# Patient Record
Sex: Male | Born: 1945
Health system: Southern US, Community
[De-identification: ages and names within clinical notes are randomized; demographics above are authoritative.]

## PROBLEM LIST (undated history)

## (undated) DIAGNOSIS — I10 Essential (primary) hypertension: Secondary | ICD-10-CM

## (undated) DIAGNOSIS — N183 Chronic kidney disease, stage 3 unspecified: Secondary | ICD-10-CM

## (undated) DIAGNOSIS — E785 Hyperlipidemia, unspecified: Secondary | ICD-10-CM

## (undated) DIAGNOSIS — R55 Syncope and collapse: Secondary | ICD-10-CM

## (undated) DIAGNOSIS — I2510 Atherosclerotic heart disease of native coronary artery without angina pectoris: Secondary | ICD-10-CM

## (undated) DIAGNOSIS — G4733 Obstructive sleep apnea (adult) (pediatric): Secondary | ICD-10-CM

## (undated) DIAGNOSIS — K219 Gastro-esophageal reflux disease without esophagitis: Secondary | ICD-10-CM

## (undated) DIAGNOSIS — I219 Acute myocardial infarction, unspecified: Secondary | ICD-10-CM

## (undated) DIAGNOSIS — T7840XA Allergy, unspecified, initial encounter: Secondary | ICD-10-CM

## (undated) DIAGNOSIS — H269 Unspecified cataract: Secondary | ICD-10-CM

## (undated) DIAGNOSIS — M1990 Unspecified osteoarthritis, unspecified site: Secondary | ICD-10-CM

## (undated) DIAGNOSIS — G4730 Sleep apnea, unspecified: Secondary | ICD-10-CM

## (undated) DIAGNOSIS — G709 Myoneural disorder, unspecified: Secondary | ICD-10-CM

## (undated) HISTORY — DX: Allergy, unspecified, initial encounter: T78.40XA

## (undated) HISTORY — PX: POLYPECTOMY: SHX149

## (undated) HISTORY — DX: Obstructive sleep apnea (adult) (pediatric): G47.33

## (undated) HISTORY — DX: Essential (primary) hypertension: I10

## (undated) HISTORY — DX: Unspecified cataract: H26.9

## (undated) HISTORY — DX: Gastro-esophageal reflux disease without esophagitis: K21.9

## (undated) HISTORY — PX: COLONOSCOPY: SHX174

## (undated) HISTORY — DX: Sleep apnea, unspecified: G47.30

## (undated) HISTORY — DX: Hyperlipidemia, unspecified: E78.5

## (undated) HISTORY — PX: TRANSTHORACIC ECHOCARDIOGRAM: SHX275

## (undated) HISTORY — DX: Syncope and collapse: R55

## (undated) HISTORY — DX: Atherosclerotic heart disease of native coronary artery without angina pectoris: I25.10

## (undated) HISTORY — PX: CARDIAC CATHETERIZATION: SHX172

## (undated) HISTORY — DX: Unspecified osteoarthritis, unspecified site: M19.90

## (undated) HISTORY — DX: Myoneural disorder, unspecified: G70.9

---
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
## 1981-01-25 HISTORY — PX: VASECTOMY: SHX75

## 1981-01-25 HISTORY — PX: INGUINAL HERNIA REPAIR: SUR1180

## 1996-01-26 HISTORY — PX: SPERMATOCELECTOMY: SHX2420

## 1997-10-08 ENCOUNTER — Other Ambulatory Visit: Admission: RE | Admit: 1997-10-08 | Discharge: 1997-10-08 | Payer: Self-pay | Admitting: Internal Medicine

## 2003-12-04 ENCOUNTER — Ambulatory Visit: Payer: Self-pay | Admitting: Internal Medicine

## 2004-03-20 ENCOUNTER — Ambulatory Visit: Payer: Self-pay | Admitting: Internal Medicine

## 2004-06-29 ENCOUNTER — Ambulatory Visit: Payer: Self-pay | Admitting: Internal Medicine

## 2004-09-21 ENCOUNTER — Ambulatory Visit: Payer: Self-pay | Admitting: Internal Medicine

## 2004-10-15 ENCOUNTER — Ambulatory Visit: Payer: Self-pay | Admitting: Pulmonary Disease

## 2004-10-15 ENCOUNTER — Ambulatory Visit (HOSPITAL_BASED_OUTPATIENT_CLINIC_OR_DEPARTMENT_OTHER): Admission: RE | Admit: 2004-10-15 | Discharge: 2004-10-15 | Payer: Self-pay | Admitting: Pulmonary Disease

## 2004-10-28 ENCOUNTER — Ambulatory Visit: Payer: Self-pay | Admitting: Pulmonary Disease

## 2004-11-09 ENCOUNTER — Ambulatory Visit: Payer: Self-pay | Admitting: Pulmonary Disease

## 2004-11-19 ENCOUNTER — Ambulatory Visit: Payer: Self-pay | Admitting: Internal Medicine

## 2005-01-06 ENCOUNTER — Ambulatory Visit: Payer: Self-pay | Admitting: Internal Medicine

## 2005-03-30 ENCOUNTER — Ambulatory Visit: Payer: Self-pay | Admitting: Internal Medicine

## 2005-05-10 ENCOUNTER — Ambulatory Visit: Payer: Self-pay | Admitting: Pulmonary Disease

## 2005-06-04 ENCOUNTER — Encounter: Payer: Self-pay | Admitting: Pulmonary Disease

## 2005-06-18 ENCOUNTER — Ambulatory Visit: Payer: Self-pay | Admitting: Pulmonary Disease

## 2005-09-13 ENCOUNTER — Ambulatory Visit: Payer: Self-pay | Admitting: Internal Medicine

## 2005-10-01 ENCOUNTER — Ambulatory Visit: Payer: Self-pay | Admitting: Internal Medicine

## 2005-11-29 ENCOUNTER — Ambulatory Visit: Payer: Self-pay | Admitting: Internal Medicine

## 2005-12-06 ENCOUNTER — Ambulatory Visit: Payer: Self-pay | Admitting: Internal Medicine

## 2005-12-20 ENCOUNTER — Ambulatory Visit: Payer: Self-pay | Admitting: Internal Medicine

## 2006-01-13 ENCOUNTER — Ambulatory Visit: Payer: Self-pay | Admitting: Family Medicine

## 2006-03-11 ENCOUNTER — Ambulatory Visit: Payer: Self-pay | Admitting: Internal Medicine

## 2006-05-14 DIAGNOSIS — K219 Gastro-esophageal reflux disease without esophagitis: Secondary | ICD-10-CM

## 2006-05-14 DIAGNOSIS — I10 Essential (primary) hypertension: Secondary | ICD-10-CM | POA: Insufficient documentation

## 2006-05-14 DIAGNOSIS — J309 Allergic rhinitis, unspecified: Secondary | ICD-10-CM | POA: Insufficient documentation

## 2006-05-14 DIAGNOSIS — G4733 Obstructive sleep apnea (adult) (pediatric): Secondary | ICD-10-CM | POA: Insufficient documentation

## 2006-05-14 DIAGNOSIS — E785 Hyperlipidemia, unspecified: Secondary | ICD-10-CM | POA: Insufficient documentation

## 2007-02-16 ENCOUNTER — Ambulatory Visit: Payer: Self-pay | Admitting: Internal Medicine

## 2007-02-16 DIAGNOSIS — R109 Unspecified abdominal pain: Secondary | ICD-10-CM | POA: Insufficient documentation

## 2007-02-16 DIAGNOSIS — R079 Chest pain, unspecified: Secondary | ICD-10-CM | POA: Insufficient documentation

## 2007-02-17 LAB — CONVERTED CEMR LAB
Amylase: 64 units/L (ref 27–131)
BUN: 25 mg/dL — ABNORMAL HIGH (ref 6–23)
Basophils Relative: 0.1 % (ref 0.0–1.0)
Bilirubin, Direct: 0.1 mg/dL (ref 0.0–0.3)
Calcium: 9.9 mg/dL (ref 8.4–10.5)
Cholesterol: 218 mg/dL (ref 0–200)
Eosinophils Relative: 2 % (ref 0.0–5.0)
GFR calc Af Amer: 73 mL/min
Hemoglobin: 16.5 g/dL (ref 13.0–17.0)
Lipase: 21 units/L (ref 11.0–59.0)
Lymphocytes Relative: 21.5 % (ref 12.0–46.0)
Monocytes Absolute: 0.5 10*3/uL (ref 0.2–0.7)
Monocytes Relative: 12.4 % — ABNORMAL HIGH (ref 3.0–11.0)
Neutro Abs: 2.9 10*3/uL (ref 1.4–7.7)
Potassium: 4.3 meq/L (ref 3.5–5.1)
Total Bilirubin: 1 mg/dL (ref 0.3–1.2)
Total Protein: 8.1 g/dL (ref 6.0–8.3)
VLDL: 20 mg/dL (ref 0–40)
WBC: 4.4 10*3/uL — ABNORMAL LOW (ref 4.5–10.5)

## 2007-05-01 ENCOUNTER — Ambulatory Visit: Payer: Self-pay | Admitting: Internal Medicine

## 2007-05-09 ENCOUNTER — Encounter: Payer: Self-pay | Admitting: Family Medicine

## 2007-05-09 ENCOUNTER — Encounter: Payer: Self-pay | Admitting: Internal Medicine

## 2007-05-09 ENCOUNTER — Ambulatory Visit (HOSPITAL_COMMUNITY): Admission: RE | Admit: 2007-05-09 | Discharge: 2007-05-09 | Payer: Self-pay | Admitting: Internal Medicine

## 2007-05-09 LAB — HM COLONOSCOPY: HM Colonoscopy: NORMAL

## 2007-05-17 ENCOUNTER — Ambulatory Visit: Payer: Self-pay | Admitting: Internal Medicine

## 2007-07-19 ENCOUNTER — Ambulatory Visit: Payer: Self-pay | Admitting: Internal Medicine

## 2007-07-19 DIAGNOSIS — A938 Other specified arthropod-borne viral fevers: Secondary | ICD-10-CM

## 2008-07-09 ENCOUNTER — Encounter: Payer: Self-pay | Admitting: Internal Medicine

## 2008-07-10 ENCOUNTER — Ambulatory Visit: Payer: Self-pay | Admitting: Internal Medicine

## 2008-07-12 LAB — CONVERTED CEMR LAB
AST: 20 units/L (ref 0–37)
Alkaline Phosphatase: 76 units/L (ref 39–117)
BUN: 29 mg/dL — ABNORMAL HIGH (ref 6–23)
Basophils Relative: 0.1 % (ref 0.0–3.0)
Bilirubin, Direct: 0 mg/dL (ref 0.0–0.3)
CO2: 31 meq/L (ref 19–32)
Calcium: 9.2 mg/dL (ref 8.4–10.5)
Cholesterol: 216 mg/dL — ABNORMAL HIGH (ref 0–200)
Creatinine, Ser: 1.2 mg/dL (ref 0.4–1.5)
Direct LDL: 143.7 mg/dL
Eosinophils Absolute: 0.1 10*3/uL (ref 0.0–0.7)
HCT: 43.9 % (ref 39.0–52.0)
Lymphs Abs: 0.9 10*3/uL (ref 0.7–4.0)
MCHC: 34.5 g/dL (ref 30.0–36.0)
MCV: 92.4 fL (ref 78.0–100.0)
Microalb, Ur: 1.8 mg/dL (ref 0.0–1.9)
Monocytes Absolute: 0.5 10*3/uL (ref 0.1–1.0)
Neutrophils Relative %: 61.7 % (ref 43.0–77.0)
PSA: 0.39 ng/mL (ref 0.10–4.00)
RBC: 4.75 M/uL (ref 4.22–5.81)
Sodium: 141 meq/L (ref 135–145)
Total Bilirubin: 1 mg/dL (ref 0.3–1.2)
Total CHOL/HDL Ratio: 5

## 2008-07-25 ENCOUNTER — Ambulatory Visit: Payer: Self-pay | Admitting: Pulmonary Disease

## 2008-11-13 ENCOUNTER — Ambulatory Visit: Payer: Self-pay | Admitting: Internal Medicine

## 2008-12-23 ENCOUNTER — Telehealth: Payer: Self-pay | Admitting: Internal Medicine

## 2009-02-12 ENCOUNTER — Ambulatory Visit: Payer: Self-pay | Admitting: Internal Medicine

## 2009-07-31 ENCOUNTER — Ambulatory Visit: Payer: Self-pay | Admitting: Internal Medicine

## 2009-08-01 LAB — CONVERTED CEMR LAB
AST: 21 units/L (ref 0–37)
Albumin: 4.1 g/dL (ref 3.5–5.2)
Alkaline Phosphatase: 73 units/L (ref 39–117)
BUN: 26 mg/dL — ABNORMAL HIGH (ref 6–23)
Basophils Absolute: 0.1 10*3/uL (ref 0.0–0.1)
CO2: 27 meq/L (ref 19–32)
Calcium: 9.1 mg/dL (ref 8.4–10.5)
Cholesterol: 198 mg/dL (ref 0–200)
Creatinine, Ser: 1.3 mg/dL (ref 0.4–1.5)
Eosinophils Absolute: 0.2 10*3/uL (ref 0.0–0.7)
GFR calc non Af Amer: 61.11 mL/min (ref >60)
Glucose, Bld: 91 mg/dL (ref 70–99)
HDL: 45.4 mg/dL (ref >39.00)
Lymphocytes Relative: 26.4 % (ref 12.0–46.0)
MCHC: 34.8 g/dL (ref 30.0–36.0)
Monocytes Relative: 15.8 % — ABNORMAL HIGH (ref 3.0–12.0)
Neutro Abs: 2.1 10*3/uL (ref 1.4–7.7)
Platelets: 215 10*3/uL (ref 150.0–400.0)
RDW: 12.6 % (ref 11.5–14.6)
TSH: 1.79 microintl units/mL (ref 0.35–5.50)
Total Bilirubin: 0.7 mg/dL (ref 0.3–1.2)
Triglycerides: 112 mg/dL (ref 0.0–149.0)
VLDL: 22.4 mg/dL (ref 0.0–40.0)

## 2009-08-04 ENCOUNTER — Ambulatory Visit: Payer: Self-pay | Admitting: Internal Medicine

## 2009-08-04 DIAGNOSIS — B079 Viral wart, unspecified: Secondary | ICD-10-CM | POA: Insufficient documentation

## 2009-08-06 ENCOUNTER — Ambulatory Visit: Payer: Self-pay | Admitting: Family Medicine

## 2009-08-06 DIAGNOSIS — M722 Plantar fascial fibromatosis: Secondary | ICD-10-CM | POA: Insufficient documentation

## 2009-08-06 DIAGNOSIS — M6580 Other synovitis and tenosynovitis, unspecified site: Secondary | ICD-10-CM

## 2009-09-09 ENCOUNTER — Ambulatory Visit: Payer: Self-pay | Admitting: Internal Medicine

## 2009-09-10 LAB — CONVERTED CEMR LAB
Albumin: 4.1 g/dL (ref 3.5–5.2)
Chloride: 99 meq/L (ref 96–112)
Glucose, Bld: 77 mg/dL (ref 70–99)
Phosphorus: 3.1 mg/dL (ref 2.3–4.6)
Potassium: 4.2 meq/L (ref 3.5–5.1)
Sodium: 138 meq/L (ref 135–145)

## 2009-09-25 DIAGNOSIS — I2510 Atherosclerotic heart disease of native coronary artery without angina pectoris: Secondary | ICD-10-CM

## 2009-09-25 HISTORY — DX: Atherosclerotic heart disease of native coronary artery without angina pectoris: I25.10

## 2009-10-18 ENCOUNTER — Ambulatory Visit: Payer: Self-pay | Admitting: Cardiology

## 2009-10-18 ENCOUNTER — Inpatient Hospital Stay (HOSPITAL_COMMUNITY): Admission: EM | Admit: 2009-10-18 | Discharge: 2009-10-20 | Payer: Self-pay | Admitting: Emergency Medicine

## 2009-10-18 DIAGNOSIS — I2510 Atherosclerotic heart disease of native coronary artery without angina pectoris: Secondary | ICD-10-CM | POA: Insufficient documentation

## 2009-10-20 HISTORY — PX: LEFT HEART CATH AND CORONARY ANGIOGRAPHY: CATH118249

## 2009-11-14 ENCOUNTER — Telehealth (INDEPENDENT_AMBULATORY_CARE_PROVIDER_SITE_OTHER): Payer: Self-pay | Admitting: *Deleted

## 2009-12-04 ENCOUNTER — Ambulatory Visit: Payer: Self-pay | Admitting: Pulmonary Disease

## 2009-12-10 ENCOUNTER — Telehealth (INDEPENDENT_AMBULATORY_CARE_PROVIDER_SITE_OTHER): Payer: Self-pay | Admitting: *Deleted

## 2009-12-11 ENCOUNTER — Telehealth: Payer: Self-pay | Admitting: Pulmonary Disease

## 2010-02-24 NOTE — Assessment & Plan Note (Signed)
Summary: 30 MIN APPT - REFER FRM DR. LETVAK TO EVAL ONGOING RGT HEEL P...   Vital Signs:  Patient profile:   65 year old male Height:      69 inches Weight:      191.2 pounds BMI:     28.34 Temp:     98.8 degrees F oral Pulse rate:   76 / minute Pulse rhythm:   regular BP sitting:   130 / 80  (left arm) Cuff size:   regular  Vitals Entered By: Benny Lennert CMA Duncan Dull) (August 06, 2009 2:00 PM)  History of Present Illness: Chief complaint ongoing right heel pain per letvak  65 year old male:  very pleasant active gentleman who presents with an approximate two-month history of right sided heel pain.  He has no traumatic incidents of injury.  He describes this as if it feels like a stone bruise. No twisting injury, no fall or other history of chronic foot and ankle problems.  Patient did sustain a severe left-sided ankle sprain several years ago.  Symptoms or worsens after a very long hard day  and with walking a lot particularly up and down hills.  right LE: The patient has had some right-sided lateral tendinopathy on his elbow  as well,  however at this point he is asymptomatic and not really having any problems.  He also mentions thate has had some recent back pain, however again he is fully asymptomatic and having no difficulty.    Allergies: 1)  ! Bisoprolol-Hydrochlorothiazide (Bisoprolol-Hydrochlorothiazide)  Past History:  Past medical, surgical, family and social histories (including risk factors) reviewed, and no changes noted (except as noted below).  Past Medical History: Reviewed history from 07/10/2008 and no changes required. Allergic rhinitis GERD Hyperlipidemia Hypertension Obstructive sleep apnea  Past Surgical History: Reviewed history from 07/10/2008 and no changes required. 1982     RIH  9/99      EGD--esophagitis 10/98    Spermatocele repair Isabel Caprice) 05/09/07 Colonoscopy Nml (Dr Juanda Chance)   Family History: Reviewed history from 11/13/2008 and  no changes required. Dad died @70   DM, CHF, asthma Mom died @55  of CVA, HTN 4 brothers--1 has neuropathy 1 sister--has diet controlled diabetes HTN in all sibs No colon cancer Brother with prostate cancer  Social History: Reviewed history from 02/16/2007 and no changes required. 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 Otherwise the pertinent positives of the ROS are noted above.    Physical Exam  General:  GEN: Well-developed,well-nourished,in no acute distress; alert,appropriate and cooperative throughout examination HEENT: Normocephalic and atraumatic without obvious abnormalities. No apparent alopecia or balding. Ears, externally no deformities PULM: Breathing comfortably in no respiratory distress EXT: No clubbing, cyanosis, or edema PSYCH: Normally interactive. Cooperative during the interview. Pleasant. Friendly and conversant. Not anxious or depressed appearing. Normal, full affect.  Msk:  R foot Echymosis: no Edema: no ROM: full LE B Gait: heel toe, non-antalgic MT pain: no Lateral Mall: NT Medial Mall: NT Talus: NT Navicular: NT Calcaneous: NT Metatarsals: NT 5th MT: NT Phalanges: NT Achilles: NT Plantar Fascia: tender, medial along PF. Pain with forced dorsi Fat Pad: NT Peroneals: NT Post Tib: NT Great Toe: limited motion and some bony  hypertrophy Ant Drawer: neg Other foot breakdown: none Long arch: preserved, cavus foot Transverse arch: some breakdown and bunion formation. Hindfoot breakdown: none Sensation: intact  Additional Exam:  R elbow Ecchymosis or edema: neg ROM: full flexion, extension, pronation, supination Shoulder ROM:  Full Flexion: 5/5 Extension: 5/5 Supination: 5/5  Pronation: 5/5 Wrist ext: 5/5 Wrist flexion: 5/5 No gross bony abnormality Varus and Valgus stress: stable ECRB tenderness: neg Medial epicondyle: NT Lateral epicondyle, resisted wrist extension from wrist full pronation and flexion: NT grip: 5/5  sensation intact    Impression & Recommendations:  Problem # 1:  PLANTAR FASCIITIS (ICD-728.71) Assessment New >30 minutes spent in face to face time with patient, >50% spent in counselling or coordination of care: extensively reviewed the anatomy and mechanism with the patient. Given rehabilitation programs from the American Academy of foot and ankle surgeons and the American Academy of orthopedic sports medicine for plantar fasciitis. We reviewed all of these til he understood  Also placed in Tuli's heel cups  Patient has some decent OTC orthotics, which I would continue these  cc: Dr. Alphonsus Sias  His updated medication list for this problem includes:    Ibuprofen 200 Mg Caps (Ibuprofen) .Marland Kitchen... Take 2 by mouth two times a day as needed  Problem # 2:  TENDINITIS, ELBOW (ICD-727.09) Assessment: New aymptomatic at this point, rec basic elbow care and rehab  Complete Medication List: 1)  Prilosec 20 Mg Cpdr (Omeprazole) .... Take 1 tablet by mouth once a day 2)  Fish Oil 1000 Mg Caps (Omega-3 fatty acids) .... Take 1 by mouth two times a day 3)  Glucosamine-chondroitin 1500-1200 Mg/66ml Liqd (Glucosamine-chondroitin) .... Take 1 by mouth two times a day 4)  Ibuprofen 200 Mg Caps (Ibuprofen) .... Take 2 by mouth two times a day as needed 5)  Acidophilus Caps (Lactobacillus) .... Take 1 by mouth once daily 6)  Losartan Potassium-hctz 50-12.5 Mg Tabs (Losartan potassium-hctz) .Marland Kitchen.. 1 tab daily for high blood pressure  Current Allergies (reviewed today): ! BISOPROLOL-HYDROCHLOROTHIAZIDE (BISOPROLOL-HYDROCHLOROTHIAZIDE)

## 2010-02-24 NOTE — Progress Notes (Signed)
Summary: oral appliance  Phone Note Call from Patient Call back at Home Phone [phone]   Caller: Spouse-linda Creer Call For: clance Summary of Call: needs dr Myrtis Ser first name and the oh# to call. (regarding referral)  Initial call taken by: Tivis Ringer, CNA,  December 10, 2009 4:42 PM  Follow-up for Phone Call        spoke with [facility] of patient-she is aware that is was Althea Grimmer [address]; the phone number was given to [place] as well.Reynaldo Minium CMA  December 10, 2009 5:05 PM

## 2010-02-24 NOTE — Progress Notes (Signed)
Summary:  dental appliance  Phone Note Call from Patient Call back at Home Phone [phone]   Caller: Spouse--linda--[phone] Call For: clance Reason for Call: Talk to Nurse Summary of Call: Patient's wife calling for patient.  Patient last saw [facility] 7/10 and at that visit Dr. Shelle Iron suggested that he might would benefit from a dental appliance.  They are now wanting a referral. Initial call taken by: Lehman Prom,  November 14, 2009 10:29 AM  Follow-up for Phone Call        told pt's wife he would need ov because it has been over a yr since he saw kc --appt scheduled for 12/04/09 at 2:15 Follow-up by: Philipp Deputy CMA,  November 14, 2009 10:52 AM

## 2010-02-24 NOTE — Assessment & Plan Note (Signed)
Summary: f/u for osa   Primary Provider/Referring Provider:  Alphonsus Sias  CC:  pt here to discuss dental appliance. Marland Kitchen  History of Present Illness: the pt comes in today for f/u of his osa.  He has known moderate osa from 2006, with AHI of 22/hr.  He has been tried on cpap, and was unable to tolerate the masks.  I saw him over a year ago and recommended a possible dental appliance, but he pt never followed thru.  He comes in today where he has continued to have snoring, and has definite daytime sleepiness with periods of inactivity.  Complicating this, he has recently had an MI, and is worried about the impact of untreated osa on his CV health.  Current Medications (verified): 1)  Losartan Potassium-Hctz 100-25 Mg Tabs (Losartan Potassium-Hctz) .... Once Daily 2)  Prilosec 20 Mg  Cpdr (Omeprazole) .... Take 1 Tablet By Mouth Once A Day 3)  Fish Oil 1000 Mg Caps (Omega-3 Fatty Acids) .... Take 1 By Mouth Two Times A Day 4)  Ibuprofen 200 Mg Caps (Ibuprofen) .... Take 2 By Mouth Two Times A Day As Needed 5)  Aspirin 325 Mg Tabs (Aspirin) .... Once Daily 6)  Lipitor 40 Mg Tabs (Atorvastatin Calcium) .... Once Daily 7)  Metoprolol Succinate 50 Mg Xr24h-Tab (Metoprolol Succinate) .... Once Daily 8)  Multivitamins  Caps (Multiple Vitamin) .... Once Daily  Allergies (verified): 1)  ! Bisoprolol-Hydrochlorothiazide (Bisoprolol-Hydrochlorothiazide) PMH-FH-SH reviewed-no changes except otherwise noted  Review of Systems  The patient denies shortness of breath with activity, shortness of breath at rest, productive cough, non-productive cough, coughing up blood, chest pain, irregular heartbeats, acid heartburn, indigestion, loss of appetite, weight change, abdominal pain, difficulty swallowing, sore throat, tooth/dental problems, headaches, nasal congestion/difficulty breathing through nose, sneezing, itching, ear ache, anxiety, depression, hand/feet swelling, joint stiffness or pain, rash, change in color  of mucus, and fever.    Vital Signs:  Patient profile:   65 year old male Height:      69 inches Weight:      191 pounds BMI:     28.31 O2 Sat:      95 % on Room air Temp:     97.9 degrees F oral Pulse rate:   80 / minute BP sitting:   124 / 78  (left arm) Cuff size:   regular  Vitals Entered By: Carver Fila (December 04, 2009 2:26 PM)  O2 Flow:  Room air CC: pt here to discuss dental appliance.  Comments meds and allergies updated Phone number updated Carver Fila  December 04, 2009 2:27 PM    Physical Exam  General:  ow male in nad Extremities:  no edema noted, no cyanosis  Neurologic:  alert, does not appear sleepy, moves all 4.   Impression & Recommendations:  Problem # 1:  OBSTRUCTIVE SLEEP APNEA (ICD-327.23) the pt has known osa and is cpap intolerant.  I have discussed other treatment options with him, and feel that dental appliance Myren be a viable option for him.  Will make the referral for him, and he is to contact me if any further concerns or questions.  I will be available for questions or f/u sleep testing down the road when required.  I have also asked the pt to work on modest weight loss.  Medications Added to Medication List This Visit: 1)  Losartan Potassium-hctz 100-25 Mg Tabs (Losartan potassium-hctz) .... Once daily 2)  Aspirin 325 Mg Tabs (Aspirin) .... Once daily 3)  Lipitor 40  Mg Tabs (Atorvastatin calcium) .... Once daily 4)  Metoprolol Succinate 50 Mg Xr24h-tab (Metoprolol succinate) .... Once daily 5)  Multivitamins Caps (Multiple vitamin) .... Once daily  Other Orders: Est. Patient Level III (88416) Dental Referral (Dentist)  Patient Instructions: 1)  will check and see which dental provider is on your insurance plan and make the referral. 2)  work on modest weight loss.

## 2010-02-24 NOTE — Assessment & Plan Note (Signed)
Summary: 2 month follow up/rbh   Vital Signs:  Patient profile:   65 year old male Weight:      191 pounds Temp:     98.5 degrees F oral Pulse rate:   80 / minute Pulse rhythm:   regular BP sitting:   148 / 90  (left arm) Cuff size:   large  Vitals Entered By: Mervin Hack CMA Duncan Dull) (February 12, 2009 8:17 AM)  Serial Vital Signs/Assessments:  Time      Position  BP       Pulse  Resp  Temp     By           R Arm     144/90                         Cindee Salt MD  CC: 2 month follow-up   History of Present Illness: No problems with the amlodipine doesn't check his blood pressure  Stopped the ziac after severe joint stiffness and aching These did fade away once stopping it  No chest pain No SOB No edema No dizziness  Allergies: 1)  ! Bisoprolol-Hydrochlorothiazide (Bisoprolol-Hydrochlorothiazide)  Past History:  Past medical, surgical, family and social histories (including risk factors) reviewed for relevance to current acute and chronic problems.  Past Medical History: Reviewed history from 07/10/2008 and no changes required. Allergic rhinitis GERD Hyperlipidemia Hypertension Obstructive sleep apnea  Past Surgical History: Reviewed history from 07/10/2008 and no changes required. 1982     RIH  9/99      EGD--esophagitis 10/98    Spermatocele repair Isabel Caprice) 05/09/07 Colonoscopy Nml (Dr Juanda Chance)   Family History: Reviewed history from 11/13/2008 and no changes required. Dad died @70   DM, CHF, asthma Mom died @55  of CVA, HTN 4 brothers--1 has neuropathy 1 sister--has diet controlled diabetes HTN in all sibs No colon cancer Brother with prostate cancer  Social History: Reviewed history from 02/16/2007 and no changes required. 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         no new stomach  trouble  Physical Exam  General:  alert and normal appearance.   Neck:  supple, no masses, no thyromegaly, no carotid bruits, and no cervical lymphadenopathy.   Lungs:  normal respiratory effort and normal breath sounds.   Heart:  normal rate, regular rhythm, no murmur, and no gallop.   Msk:  no joint tenderness and no joint swelling.   Extremities:  No edema Psych:  normally interactive, good eye contact, not anxious appearing, and not depressed appearing.     Impression & Recommendations:  Problem # 1:  HYPERTENSION (ICD-401.9) Assessment Improved reasonable control now will continue current meds  His updated medication list for this problem includes:    Amlodipine Besylate 5 Mg Tabs (Amlodipine besylate) .Marland Kitchen... Take 1 by mouth once daily  BP today: 148/90 Prior BP: 140/90 (11/13/2008)  Labs Reviewed: K+: 3.9 (07/10/2008) Creat: : 1.2 (07/10/2008)   Chol: 216 (07/10/2008)   HDL: 44.30 (07/10/2008)   LDL: DEL (02/16/2007)   TG: 101.0 (07/10/2008)  Complete Medication List: 1)  Prilosec 20 Mg Cpdr (Omeprazole) .... Take 1 tablet by mouth once a day 2)  Aspirin 81 Mg Tabs (Aspirin) .... Take 1 by mouth once daily 3)  Acidophilus Caps (Lactobacillus) .... Take 1 by mouth once daily 4)  Amlodipine Besylate  5 Mg Tabs (Amlodipine besylate) .... Take 1 by mouth once daily  Patient Instructions: 1)  Please schedule a follow-up appointment in 6 months for physical  Current Allergies (reviewed today): ! BISOPROLOL-HYDROCHLOROTHIAZIDE (BISOPROLOL-HYDROCHLOROTHIAZIDE)

## 2010-02-24 NOTE — Assessment & Plan Note (Signed)
Summary: ONE MONTH FOLLOW UP / LFW   Vital Signs:  Patient profile:   65 year old male Weight:      194 pounds Temp:     98.1 degrees F oral Pulse rate:   72 / minute Pulse rhythm:   regular BP sitting:   150 / 90  (left arm) Cuff size:   large  Vitals Entered By: Mervin Hack CMA Duncan Dull) (September 09, 2009 11:17 AM) CC: 1 month follow-up   History of Present Illness: No problems with the new BP med No dizziness or chest pain No SOB Occ headaches  BP was better the next day when he saw Dr Patsy Lager Home BP 138/80 or so  Allergies: 1)  ! Bisoprolol-Hydrochlorothiazide (Bisoprolol-Hydrochlorothiazide)  Past History:  Past medical, surgical, family and social histories (including risk factors) reviewed for relevance to current acute and chronic problems.  Past Medical History: Reviewed history from 07/10/2008 and no changes required. Allergic rhinitis GERD Hyperlipidemia Hypertension Obstructive sleep apnea  Past Surgical History: Reviewed history from 07/10/2008 and no changes required. 1982     RIH  9/99      EGD--esophagitis 10/98    Spermatocele repair Isabel Caprice) 05/09/07 Colonoscopy Nml (Dr Juanda Chance)   Family History: Reviewed history from 11/13/2008 and no changes required. Dad died @70   DM, CHF, asthma Mom died @55  of CVA, HTN 4 brothers--1 has neuropathy 1 sister--has diet controlled diabetes HTN in all sibs No colon cancer Brother with prostate cancer  Social History: Reviewed history from 02/16/2007 and no changes required. 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   Neck:  supple, no masses, no thyromegaly, no carotid bruits, and no cervical lymphadenopathy.   Lungs:  normal respiratory effort, no intercostal retractions, no accessory muscle  use, and normal breath sounds.   Heart:  normal rate, regular rhythm, no murmur, and no gallop.   Extremities:  no edema Psych:  normally interactive, good eye contact, not anxious appearing, and not depressed appearing.     Impression & Recommendations:  Problem # 1:  HYPERTENSION (ICD-401.9) Assessment Improved  better than last visit some variabliity --low at visit with Dr Patsy Lager will continue med  His updated medication list for this problem includes:    Losartan Potassium-hctz 50-12.5 Mg Tabs (Losartan potassium-hctz) .Marland Kitchen... 1 tab daily for high blood pressure  BP today: 150/90 Prior BP: 130/80 (08/06/2009)  Labs Reviewed: K+: 4.2 (07/31/2009) Creat: : 1.3 (07/31/2009)   Chol: 198 (07/31/2009)   HDL: 45.40 (07/31/2009)   LDL: 130 (07/31/2009)   TG: 112.0 (07/31/2009)  Orders: TLB-Renal Function Panel (80069-RENAL) Venipuncture (16109)  Complete Medication List: 1)  Losartan Potassium-hctz 50-12.5 Mg Tabs (Losartan potassium-hctz) .Marland Kitchen.. 1 tab daily for high blood pressure 2)  Prilosec 20 Mg Cpdr (Omeprazole) .... Take 1 tablet by mouth once a day 3)  Fish Oil 1000 Mg Caps (Omega-3 fatty acids) .... Take 1 by mouth two times a day 4)  Glucosamine-chondroitin 1500-1200 Mg/59ml Liqd (Glucosamine-chondroitin) .... Take 1 by mouth two times a day 5)  Ibuprofen 200 Mg Caps (Ibuprofen) .... Take 2 by mouth two times a day as needed 6)  Acidophilus Caps (Lactobacillus) .... Take 1 by mouth once daily  Patient Instructions: 1)  Please schedule a follow-up appointment in 6 months .   Current Allergies (  reviewed today): ! BISOPROLOL-HYDROCHLOROTHIAZIDE (BISOPROLOL-HYDROCHLOROTHIAZIDE)

## 2010-02-24 NOTE — Progress Notes (Signed)
Summary: referral appt  Phone Note Call from Patient Call back at Home Phone [phone]   Caller: Patient Call For: Jermery Caratachea Summary of Call: Pt states that it has been a week now and he still hasn't heard from Dr. Henrietta Hoover office, pls advise. Initial call taken by: Darletta Moll,  December 11, 2009 11:26 AM  Follow-up for Phone Call        I called Dr. Henrietta Hoover office at [phone] and spoke with Gigi Gin and she states she did not receive any information on this pt.  I have refaxed referral and records toDr. Myrtis Ser office. Peggy aware. pt also aware. Carron Curie CMA  December 11, 2009 11:49 AM

## 2010-02-24 NOTE — Assessment & Plan Note (Signed)
Summary: CPX/DLO   Vital Signs:  Patient profile:   65 year old male Height:      69 inches Weight:      189 pounds BMI:     28.01 Temp:     97.5 degrees F tympanic Pulse rate:   76 / minute Pulse rhythm:   regular BP sitting:   168 / 110  (left arm) Cuff size:   large  Vitals Entered By: Mervin Hack CMA Duncan Dull) (August 04, 2009 11:58 AM)  Serial Vital Signs/Assessments:  Comments: 11:59 AM checked pt's right arm 162/108 sitting By: Mervin Hack CMA (AAMA)    History of Present Illness: Doing fine  No new concerns Still concerned about his joint and muscle stiffness wondered about the amlodipine---we stopped the bisoprolol for the same reason has been taking ibuprofen for this with some relief  checks BP occ Very variable--   145/92 to 165/105 did stop the amlodipine for 2 weeks --didn't affect the myalgias really  having sig right heel pain esp bad when on his feet for a while---push mowing or blowing at work did get OTC arch supports  Right elbow pain occ very severe "like tennis elbow" Milone be related to using it to push 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 Bisoprolol-Hydrochlorothiazide (Bisoprolol-Hydrochlorothiazide)  Past History:  Past medical, surgical, family and social histories (including risk factors) reviewed for relevance to current acute and chronic problems.  Past Medical History: Reviewed history from 07/10/2008 and no changes required. Allergic rhinitis GERD Hyperlipidemia Hypertension Obstructive sleep apnea  Past Surgical History: Reviewed history from 07/10/2008 and no changes required. 1982     RIH  9/99      EGD--esophagitis 10/98    Spermatocele repair Isabel Caprice) 05/09/07 Colonoscopy Nml (Dr Juanda Chance)   Family History: Reviewed history from 11/13/2008 and no changes required. Dad died @70   DM, CHF,  asthma Mom died @55  of CVA, HTN 4 brothers--1 has neuropathy 1 sister--has diet controlled diabetes HTN in all sibs No colon cancer Brother with prostate cancer  Social History: Reviewed history from 02/16/2007 and no changes required. 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 Eyes:  Denies double vision and vision loss-1 eye. ENT:  Complains of decreased hearing; denies ringing in ears; mild stable hearing loss teeth okay---regular with dentist. CV:  Complains of chest pain or discomfort and lightheadness; denies difficulty breathing at night, difficulty breathing while lying down, fainting, palpitations, and shortness of breath with exertion; [place]illion get chest soreness day after doing pushups No exertional symptoms Occ mild dizziness. Resp:  Denies cough and shortness of breath. GI:  Complains of indigestion; denies abdominal pain, bloody stools, change in bowel habits, dark tarry stools, nausea, and vomiting; pirlosec controls his symptoms. GU:  Complains of erectile dysfunction; denies urinary frequency and urinary hesitancy; occ mild dribbling Has had a couple of ED episodes since on BP meds. MS:  See HPI; Complains of muscle aches. Derm:  Complains of lesion(s); denies rash; has wart on left forehead and on right hand. Neuro:  Complains of headaches; denies numbness, tingling, and weakness; occ headaches. Psych:  Complains of easily angered; denies anxiety and depression; "I get pissed off sometimes" --with son and wife in particular (doesn't act on  this other than a curt response). Heme:  Complains of abnormal bruising; denies enlarge lymph nodes; stopped the aspirin due to the bruising. Allergy:  Complains of seasonal allergies and sneezing; allergies have been better of late.  Physical Exam  General:  alert and normal  appearance.   Eyes:  pupils equal, pupils round, pupils reactive to light, and no optic disk abnormalities.   Ears:  R ear normal and L ear normal.   Mouth:  no erythema, no exudates, and no lesions.   Neck:  supple, no masses, no thyromegaly, no carotid bruits, and no cervical lymphadenopathy.   Lungs:  normal respiratory effort and normal breath sounds.   Heart:  normal rate, regular rhythm, no murmur, and no gallop.   Abdomen:  soft, non-tender, and no masses.   Rectal:  no hemorrhoids and no masses.   Prostate:  no gland enlargement and no nodules.   Msk:  no joint tenderness and no joint swelling.   Normal ROM at right elbow mild right heel tenderness Pulses:  normal in feet Extremities:  no edema Neurologic:  alert & oriented X3, strength normal in all extremities, and gait normal.   Skin:  multiple skin tags Wart over 2nd MCP on right and on left forehead Axillary Nodes:  No palpable lymphadenopathy Psych:  normally interactive, good eye contact, not anxious appearing, and not depressed appearing.     Impression & Recommendations:  Problem # 1:  PREVENTIVE HEALTH CARE (ICD-V70.0) Assessment Comment Only stays active up to date on colon PSA normal  will set up eval with Dr Patsy Lager for heel pain  Problem # 2:  HYPERTENSION (ICD-401.9) Assessment: Deteriorated worse on the med than before will stop and change to losartan with HCTZ  The following medications were removed from the medication list:    Amlodipine Besylate 5 Mg Tabs (Amlodipine besylate) .Marland Kitchen... Take 1 by mouth once daily His updated medication list for this problem includes:    Losartan Potassium-hctz 50-12.5 Mg Tabs (Losartan potassium-hctz) .Marland Kitchen... 1 tab daily for high blood pressure  BP today: 168/110 Prior BP: 148/90 (02/12/2009)  Labs Reviewed: K+: 4.2 (07/31/2009) Creat: : 1.3 (07/31/2009)   Chol: 198 (07/31/2009)   HDL: 45.40 (07/31/2009)   LDL: 130 (07/31/2009)   TG: 112.0 (07/31/2009)  Problem #  3:  HYPERLIPIDEMIA (ICD-272.4) Assessment: Unchanged no meds for now  Labs Reviewed: SGOT: 21 (07/31/2009)   SGPT: 19 (07/31/2009)   HDL:45.40 (07/31/2009), 44.30 (07/10/2008)  LDL:130 (07/31/2009), DEL (02/16/2007)  Chol:198 (07/31/2009), 216 (07/10/2008)  Trig:112.0 (07/31/2009), 101.0 (07/10/2008)  Problem # 4:  WART, VIRAL (ICD-078.10) Assessment: New  treated with 40 seconds x 2 for each  Orders: Wart Destruct <14 (17110)  Complete Medication List: 1)  Prilosec 20 Mg Cpdr (Omeprazole) .... Take 1 tablet by mouth once a day 2)  Fish Oil 1000 Mg Caps (Omega-3 fatty acids) .... Take 1 by mouth two times a day 3)  Glucosamine-chondroitin 1500-1200 Mg/29ml Liqd (Glucosamine-chondroitin) .... Take 1 by mouth two times a day 4)  Ibuprofen 200 Mg Caps (Ibuprofen) .... Take 2 by mouth two times a day as needed 5)  Acidophilus Caps (Lactobacillus) .... Take 1 by mouth once daily 6)  Losartan Potassium-hctz 50-12.5 Mg Tabs (Losartan potassium-hctz) .Marland Kitchen.. 1 tab daily for high blood pressure  Patient Instructions: 1)  Please set up appt with Dr Patsy Lager to evaluate ongoing right heel pain 2)  Please schedule a follow-up appointment in 1 month.  Prescriptions: LOSARTAN POTASSIUM-HCTZ 50-12.5 MG TABS (LOSARTAN POTASSIUM-HCTZ)  1 tab daily for high blood pressure  #30 x 12   Entered and Authorized by:   Cindee Salt MD   Signed by:   Cindee Salt MD on 08/04/2009   Method used:   Electronically to        [facility]. [address] [phone]* (retail)       [address] [place], [place]  [id number]       Ph: [phone]       Fax: [phone]   RxID:   [id number]   Current Allergies (reviewed today): ! BISOPROLOL-HYDROCHLOROTHIAZIDE (BISOPROLOL-HYDROCHLOROTHIAZIDE)   Colonoscopy  Procedure date:  05/09/2007  Findings:       Results: Normal.

## 2010-03-11 ENCOUNTER — Ambulatory Visit: Payer: Self-pay | Admitting: Internal Medicine

## 2010-03-13 ENCOUNTER — Ambulatory Visit: Payer: Self-pay | Admitting: Internal Medicine

## 2010-04-09 LAB — CBC
HCT: 41.2 % (ref 39.0–52.0)
HCT: 42 % (ref 39.0–52.0)
Hemoglobin: 14.5 g/dL (ref 13.0–17.0)
Hemoglobin: 14.6 g/dL (ref 13.0–17.0)
MCH: 31 pg (ref 26.0–34.0)
MCH: 31.6 pg (ref 26.0–34.0)
MCHC: 34.3 g/dL (ref 30.0–36.0)
MCHC: 35.2 g/dL (ref 30.0–36.0)
MCV: 89.8 fL (ref 78.0–100.0)
MCV: 90.5 fL (ref 78.0–100.0)
Platelets: 200 10*3/uL (ref 150–400)
Platelets: 224 10*3/uL (ref 150–400)
RBC: 4.59 MIL/uL (ref 4.22–5.81)
RBC: 4.63 MIL/uL (ref 4.22–5.81)
RDW: 12.4 % (ref 11.5–15.5)
RDW: 12.7 % (ref 11.5–15.5)
WBC: 5.4 10*3/uL (ref 4.0–10.5)
WBC: 6.7 10*3/uL (ref 4.0–10.5)

## 2010-04-09 LAB — BASIC METABOLIC PANEL
BUN: 22 mg/dL (ref 6–23)
BUN: 22 mg/dL (ref 6–23)
BUN: 28 mg/dL — ABNORMAL HIGH (ref 6–23)
CO2: 25 mEq/L (ref 19–32)
CO2: 28 mEq/L (ref 19–32)
Calcium: 8.8 mg/dL (ref 8.4–10.5)
Calcium: 9 mg/dL (ref 8.4–10.5)
Calcium: 9 mg/dL (ref 8.4–10.5)
Calcium: 9.1 mg/dL (ref 8.4–10.5)
Chloride: 102 mEq/L (ref 96–112)
Chloride: 105 mEq/L (ref 96–112)
Chloride: 106 mEq/L (ref 96–112)
Creatinine, Ser: 1.24 mg/dL (ref 0.4–1.5)
Creatinine, Ser: 1.28 mg/dL (ref 0.4–1.5)
Creatinine, Ser: 1.38 mg/dL (ref 0.4–1.5)
GFR calc Af Amer: >60 mL/min (ref >60)
GFR calc Af Amer: >60 mL/min (ref >60)
GFR calc Af Amer: >60 mL/min (ref >60)
GFR calc non Af Amer: 52 mL/min — ABNORMAL LOW (ref >60)
GFR calc non Af Amer: 52 mL/min — ABNORMAL LOW (ref >60)
GFR calc non Af Amer: 57 mL/min — ABNORMAL LOW (ref >60)
Glucose, Bld: 103 mg/dL — ABNORMAL HIGH (ref 70–99)
Glucose, Bld: 103 mg/dL — ABNORMAL HIGH (ref 70–99)
Potassium: 3.7 mEq/L (ref 3.5–5.1)
Potassium: 3.8 mEq/L (ref 3.5–5.1)
Potassium: 4 mEq/L (ref 3.5–5.1)
Sodium: 135 mEq/L (ref 135–145)
Sodium: 137 mEq/L (ref 135–145)
Sodium: 138 mEq/L (ref 135–145)

## 2010-04-09 LAB — PROTIME-INR
INR: 0.88 (ref 0.00–1.49)
Prothrombin Time: 12.1 seconds (ref 11.6–15.2)

## 2010-04-09 LAB — DIFFERENTIAL
Basophils Absolute: 0 10*3/uL (ref 0.0–0.1)
Basophils Relative: 1 % (ref 0–1)
Lymphocytes Relative: 21 % (ref 12–46)
Monocytes Absolute: 0.7 10*3/uL (ref 0.1–1.0)
Neutro Abs: 4.4 10*3/uL (ref 1.7–7.7)
Neutrophils Relative %: 66 % (ref 43–77)

## 2010-04-09 LAB — CK TOTAL AND CKMB (NOT AT ARMC)
CK, MB: 4.8 ng/mL — ABNORMAL HIGH (ref 0.3–4.0)
Relative Index: 2.7 — ABNORMAL HIGH (ref 0.0–2.5)
Total CK: 176 U/L (ref 7–232)

## 2010-04-09 LAB — LIPID PANEL
HDL: 44 mg/dL (ref >39)
LDL Cholesterol: 140 mg/dL — ABNORMAL HIGH (ref 0–99)
Triglycerides: 77 mg/dL (ref <150)
VLDL: 15 mg/dL (ref 0–40)

## 2010-04-09 LAB — CARDIAC PANEL(CRET KIN+CKTOT+MB+TROPI)
CK, MB: 3 ng/mL (ref 0.3–4.0)
CK, MB: 3.2 ng/mL (ref 0.3–4.0)
CK, MB: 4.1 ng/mL — ABNORMAL HIGH (ref 0.3–4.0)
Relative Index: 1.5 (ref 0.0–2.5)
Relative Index: 2.1 (ref 0.0–2.5)
Relative Index: 2.8 — ABNORMAL HIGH (ref 0.0–2.5)
Total CK: 147 U/L (ref 7–232)
Total CK: 153 U/L (ref 7–232)
Total CK: 195 U/L (ref 7–232)
Troponin I: 0.06 ng/mL (ref 0.00–0.06)
Troponin I: 0.07 ng/mL — ABNORMAL HIGH (ref 0.00–0.06)
Troponin I: 0.14 ng/mL — ABNORMAL HIGH (ref 0.00–0.06)

## 2010-04-09 LAB — POCT CARDIAC MARKERS: CKMB, poc: 1.8 ng/mL (ref 1.0–8.0)

## 2010-04-09 LAB — TROPONIN I: Troponin I: 0.04 ng/mL (ref 0.00–0.06)

## 2010-04-09 LAB — APTT: aPTT: 32 seconds (ref 24–37)

## 2010-04-09 LAB — HEPARIN LEVEL (UNFRACTIONATED): Heparin Unfractionated: 0.31 IU/mL (ref 0.30–0.70)

## 2010-04-20 ENCOUNTER — Encounter: Payer: Self-pay | Admitting: Internal Medicine

## 2010-04-20 ENCOUNTER — Ambulatory Visit (INDEPENDENT_AMBULATORY_CARE_PROVIDER_SITE_OTHER): Payer: BC Managed Care – PPO | Admitting: Internal Medicine

## 2010-04-20 VITALS — BP 110/73 | HR 74 | Temp 98.7°F | Ht 69.0 in | Wt 185.0 lb

## 2010-04-20 DIAGNOSIS — K219 Gastro-esophageal reflux disease without esophagitis: Secondary | ICD-10-CM

## 2010-04-20 DIAGNOSIS — I2510 Atherosclerotic heart disease of native coronary artery without angina pectoris: Secondary | ICD-10-CM

## 2010-04-20 DIAGNOSIS — E785 Hyperlipidemia, unspecified: Secondary | ICD-10-CM

## 2010-04-20 DIAGNOSIS — I10 Essential (primary) hypertension: Secondary | ICD-10-CM

## 2010-04-20 LAB — BASIC METABOLIC PANEL
BUN: 32 mg/dL — ABNORMAL HIGH (ref 6–23)
CO2: 30 mEq/L (ref 19–32)
Chloride: 100 mEq/L (ref 96–112)
Creatinine, Ser: 1.4 mg/dL (ref 0.4–1.5)
Glucose, Bld: 81 mg/dL (ref 70–99)

## 2010-04-20 LAB — HEPATIC FUNCTION PANEL
Albumin: 4.1 g/dL (ref 3.5–5.2)
Alkaline Phosphatase: 71 U/L (ref 39–117)
Total Bilirubin: 0.5 mg/dL (ref 0.3–1.2)

## 2010-04-20 LAB — LIPID PANEL
LDL Cholesterol: 32 mg/dL (ref 0–99)
Total CHOL/HDL Ratio: 3
Triglycerides: 193 mg/dL — ABNORMAL HIGH (ref 0.0–149.0)

## 2010-04-20 NOTE — Patient Instructions (Signed)
Please call Dr Lucia Gaskins to discuss changing meds again due to not feeling well. If your cholesterol is very low, we will decrease the dose of the atorvastatin

## 2010-04-20 NOTE — Progress Notes (Signed)
  Subjective:    Patient ID: Randall Reeves, male    DOB: Mar 10, 1945, 65 y.o.   MRN: [id number]  HPI Had non STEMI in September Cath was benign though Has seen cardiology since then---BP meds adjusted on a couple of occasions Bystolic started instead of metoprolol due to ED "but I feel so bad on this" Seeing [facility] cardiologist--Dr Lucia Gaskins  No recurrence of chest pain Back to his usual activity level "but I don't feel like doing anything" Hasn't been walking due to not feeling great Gets dizzy upon standing or heavy lifting----some degree of nausea as well  On lipitor since hospitalization No myalgias but he was wondering whether that would be part of the problem  Stomach has been okay Still on the omeprazole Bowels fine  Past Medical History  Diagnosis Date  . Allergy   . GERD (gastroesophageal reflux disease)   . Hypertension   . Hyperlipidemia   . Obstructive sleep apnea     Past Surgical History  Procedure Date  . Spermatocele repair     Family History  Problem Relation Age of Onset  . Stroke Mother   . Hypertension Mother   . Diabetes Father   . Asthma Father   . Heart failure Father   . Hypertension Sister   . Neuropathy Brother   . Hypertension Brother   . Hypertension Brother   . Hypertension Brother   . Hypertension Brother   . Cancer Brother     prostate    History   Social History  . Marital Status: Married    Spouse Name: N/A    Number of Children: 2  . Years of Education: N/A   Occupational History  . landscaping    Social History Main Topics  . Smoking status: Never Smoker   . Smokeless tobacco: Never Used  . Alcohol Use: Yes  . Drug Use: No  . Sexually Active: Not on file   Other Topics Concern  . Not on file   Social History Narrative  . No narrative on file        Review of Systems Sleeping okay--was fitted for mouth piece but took a long time. Never heard back yet Feels like his sleep is okay Systolic BP as low as 102   Objective:   Physical Exam  Constitutional: He appears well-developed and well-nourished. No distress.  Neck: Normal range of motion. Neck supple. No thyromegaly present.       No carotid bruits  Cardiovascular: Normal rate, regular rhythm, normal heart sounds and intact distal pulses.  Exam reveals no gallop.   No murmur heard. Pulmonary/Chest: Effort normal and breath sounds normal. No respiratory distress. He has no wheezes. He has no rales.  Abdominal: Soft. He exhibits no mass. There is no tenderness.  Musculoskeletal: Normal range of motion. He exhibits no edema.  Lymphadenopathy:    He has no cervical adenopathy.  Psychiatric: He has a normal mood and affect. Judgment and thought content normal.          Assessment & Plan:

## 2010-06-09 NOTE — Assessment & Plan Note (Signed)
[facility]                         GASTROENTEROLOGY OFFICE NOTE   DVAUGHN, FICKLE                           MRN:          [id number]  DATE:05/01/2007                            DOB:          July 01, 1945    Randall Reeves is a very nice 65 year old gentleman referred by Dr. Alphonsus Sias for  evaluation of change in his bowel habits and consideration of  colonoscopy. We saw him initially in 1995, 1997, 1999, and last time in  December 2002 for followup of gastroesophageal reflux disease which  initially showed to be Barrett's esophagus on biopsies in 1995, but the  subsequent endoscopies in 1997, 1999, and 2002 did not show any evidence  of intestinal metaplasia or goblet cells. He has been seen by Dr. Alphonsus Sias  for sleep apnea and most recently for change in the bowel habits. We saw  him in the office in 1998 for change in the bowel habits and diarrhea as  well as left lower quadrant abdominal discomfort. We treated him for  bacteria overgrowth with a combination of Flagyl 250 t.i.d. and Cipro  250 b.i.d. for one week in combination with Levbid 0.375 mg q.h.s. which  completely cleared out his problems. He had a flexible sigmoidoscopy by  Dr. Alphonsus Sias about 5 years ago, but he has never had a full colonoscopy.  He denies any rectal bleeding or family history of colon cancer.   MEDICATIONS:  Prilosec OTC 20 mg p.o. every day.   PAST MEDICAL HISTORY:  Allergies.   OPERATIONS:  Herniorrhaphy.   FAMILY HISTORY:  Negative.   SOCIAL HISTORY:  Married with 2 children. He works in the Scientist, clinical (histocompatibility and immunogenetics) care. He does not drink alcohol and does not smoke.   REVIEW OF SYSTEMS:  Positive for itching skin rash and cramps.   PHYSICAL EXAMINATION:  VITAL SIGNS:  Blood pressure 142/80, pulse 80,  and weight 188 pounds.  GENERAL:  He was alert and oriented, in no distress.  HEENT:  Sclerae anicteric.  NECK:  Supple. No lymphadenopathy.  LUNGS:  Clear to  auscultation.  CARDIAC:  Normal S1, S2.  ABDOMEN:  Soft, nontender, with normoactive bowel sounds. No abnormal  rushes or borborygmi, no distention, no tympania. Liver edge at costal  margin.  RECTAL:  Not done. This was done on a complete physical examination by  Dr. Alphonsus Sias recently.  EXTREMITIES:  No edema.   IMPRESSION:  16. A 65 year old white male with change in the bowel habits, most      likely bacterial overgrowth versus irritable bowel syndrome.  2. Questionable history of Barrett's esophagus in 1995, not confirmed      on 3 subsequent endoscopies up to 2002.   PLAN:  1. Colonoscopy scheduled with routine colonoscopy prep.  2. Repeat the bacterial overgrowth regimen:  Flagyl and Cipro for 1      week.  3. Continue Prilosec 20 mg daily.  4. High fiber diet.     Randall Morton. Juanda Chance, MD  Electronically Signed    DMB/MedQ  DD: 05/01/2007  DT: 05/01/2007  Job #: [id number]  cc:   [name], MD

## 2010-06-12 NOTE — Procedures (Signed)
NAMEMarland Kitchen  OLAWALE, [name]:  [id number]   MEDICAL RECORD NO.:  [id number]          PATIENT TYPE:  OUT   LOCATION:  SLEEP CENTER                 FACILITY:  [facility]   PHYSICIAN:  Marcelyn Bruins, M.D. [facility] DATE OF BIRTH:  12/13/1945   DATE OF STUDY:  10/15/2004                              NOCTURNAL POLYSOMNOGRAM   REFERRING PHYSICIAN:  Dr. Marcelyn Bruins   DATE OF STUDY:  October 15, 2004   INDICATION FOR THE STUDY:  Hypersomnia with sleep apnea.   EPWORTH SCORE:  9   SLEEP ARCHITECTURE:  The patient had a total sleep time of 360 minutes with  adequate slow wave sleep but decreased REM. Sleep onset latency was normal  and REM onset was very prolonged at 196 minutes. Sleep efficiency was  decreased at 76%.   RESPIRATORY DATA:  The patient was found to have a respiratory disturbance  index of 22 events per hour with O2 desaturation. The events were not  positional but were associated with mild to moderate snoring.   OXYGEN DATA:  The patient had O2 desaturation as low as 85% associated with  obstructive events.   CARDIAC DATA:  There was no clinically significant cardiac arrhythmia.   MOVEMENT/PARASOMNIA:  No clinically significant events were noted.   IMPRESSION:  Moderate obstructive sleep apnea with a respiratory disturbance  index of 22 events per hour and O2 desaturation as low as 85%. Treatment for  this degree of sleep apnea Randall Reeves include weight loss, upper airway surgery,  oral appliance, or CPAP. Clinical correlation is suggested.           ______________________________  Marcelyn Bruins, M.D. [facility]  Diplomate, American Board of Sleep  Medicine     KC/MEDQ  D:  10/28/2004 11:43:44  T:  10/28/2004 13:42:58  Job:  [id number]

## 2010-10-20 ENCOUNTER — Ambulatory Visit (INDEPENDENT_AMBULATORY_CARE_PROVIDER_SITE_OTHER): Payer: BC Managed Care – PPO | Admitting: Internal Medicine

## 2010-10-20 ENCOUNTER — Encounter: Payer: Self-pay | Admitting: Internal Medicine

## 2010-10-20 DIAGNOSIS — Z2911 Encounter for prophylactic immunotherapy for respiratory syncytial virus (RSV): Secondary | ICD-10-CM

## 2010-10-20 DIAGNOSIS — E785 Hyperlipidemia, unspecified: Secondary | ICD-10-CM

## 2010-10-20 DIAGNOSIS — I2510 Atherosclerotic heart disease of native coronary artery without angina pectoris: Secondary | ICD-10-CM

## 2010-10-20 DIAGNOSIS — I10 Essential (primary) hypertension: Secondary | ICD-10-CM

## 2010-10-20 DIAGNOSIS — Z23 Encounter for immunization: Secondary | ICD-10-CM

## 2010-10-20 DIAGNOSIS — Z0000 Encounter for general adult medical examination without abnormal findings: Secondary | ICD-10-CM | POA: Insufficient documentation

## 2010-10-20 LAB — LIPID PANEL
Cholesterol: 139 mg/dL (ref 0–200)
HDL: 47.5 mg/dL (ref >39.00)
LDL Cholesterol: 75 mg/dL (ref 0–99)
VLDL: 16.4 mg/dL (ref 0.0–40.0)

## 2010-10-20 LAB — BASIC METABOLIC PANEL
CO2: 28 mEq/L (ref 19–32)
Calcium: 9.4 mg/dL (ref 8.4–10.5)
Creatinine, Ser: 1.5 mg/dL (ref 0.4–1.5)
Glucose, Bld: 96 mg/dL (ref 70–99)

## 2010-10-20 LAB — CBC WITH DIFFERENTIAL/PLATELET
Basophils Absolute: 0 10*3/uL (ref 0.0–0.1)
Eosinophils Absolute: 0.1 10*3/uL (ref 0.0–0.7)
Lymphocytes Relative: 22.1 % (ref 12.0–46.0)
MCHC: 33.7 g/dL (ref 30.0–36.0)
Neutrophils Relative %: 61.5 % (ref 43.0–77.0)
RDW: 13.2 % (ref 11.5–14.6)

## 2010-10-20 LAB — TSH: TSH: 1.59 u[IU]/mL (ref 0.35–5.50)

## 2010-10-20 LAB — PSA: PSA: 0.5 ng/mL (ref 0.10–4.00)

## 2010-10-20 LAB — HEPATIC FUNCTION PANEL
Bilirubin, Direct: 0.1 mg/dL (ref 0.0–0.3)
Total Bilirubin: 0.8 mg/dL (ref 0.3–1.2)

## 2010-10-20 NOTE — Assessment & Plan Note (Signed)
Doing well Discussed PSA--will check zostavax

## 2010-10-20 NOTE — Assessment & Plan Note (Signed)
Lab Results  Component Value Date   LDLCALC 32 04/20/2010   Not clear if achiness due to atorvastatin Now on lower dose

## 2010-10-20 NOTE — Assessment & Plan Note (Signed)
BP Readings from Last 3 Encounters:  10/20/10 132/79  04/20/10 110/73  12/04/09 124/78   Good control No changes

## 2010-10-20 NOTE — Progress Notes (Signed)
Subjective:    Patient ID: Randall Reeves, male    DOB: 11-Jul-1945, 65 y.o.   MRN: [id number]  HPI Doing well Felt he was overmedicated last time  Cut atorvastatin down to 1/2 tab daily Now off bystolic also No longer fatigued or having achiness for the most part  Tries to walk regularly Still on 2 days per week work schedule---"semi retired"  Current Outpatient Prescriptions on File Prior to Visit  Medication Sig Dispense Refill  . aspirin 81 MG tablet Take 81 mg by mouth daily.        Marland Kitchen atorvastatin (LIPITOR) 40 MG tablet Take 20 mg by mouth daily.       . fish oil-omega-3 fatty acids 1000 MG capsule Take 1 g by mouth daily.       Marland Kitchen losartan-hydrochlorothiazide (HYZAAR) 100-25 MG per tablet Take 1 tablet by mouth daily.        . Multiple Vitamin (MULTIVITAMIN) tablet Take 1 tablet by mouth daily.        . nitroGLYCERIN (NITROSTAT) 0.4 MG SL tablet Place 0.4 mg under the tongue every 5 (five) minutes as needed.        Marland Kitchen omeprazole (PRILOSEC) 20 MG capsule Take 20 mg by mouth daily.          Allergies  Allergen Reactions  . Bisoprolol-Hydrochlorothiazide     REACTION: muscle pain, cramps    Past Medical History  Diagnosis Date  . Allergy   . GERD (gastroesophageal reflux disease)   . Hypertension   . Hyperlipidemia   . Obstructive sleep apnea     Past Surgical History  Procedure Date  . Spermatocele repair     Family History  Problem Relation Age of Onset  . Stroke Mother   . Hypertension Mother   . Diabetes Father   . Asthma Father   . Heart failure Father   . Hypertension Sister   . Neuropathy Brother   . Hypertension Brother   . Hypertension Brother   . Hypertension Brother   . Hypertension Brother   . Cancer Brother     prostate    History   Social History  . Marital Status: Married    Spouse Name: N/A    Number of Children: 2  . Years of Education: N/A   Occupational History  . landscaping    Social History Main Topics  . Smoking status:  Never Smoker   . Smokeless tobacco: Never Used  . Alcohol Use: Yes  . Drug Use: No  . Sexually Active: Not on file   Other Topics Concern  . Not on file   Social History Narrative  . No narrative on file     Review of Systems  Constitutional: Negative for fatigue and unexpected weight change.       Wears seat belt  HENT: Positive for hearing loss, congestion, rhinorrhea and dental problem. Negative for tinnitus.        Allergies have been controlled Regular with dentist---crown planned  Eyes: Negative for visual disturbance.       No diplopia or unilateral vision loss  Respiratory: Negative for cough, chest tightness and shortness of breath.   Cardiovascular: Positive for palpitations. Negative for chest pain and leg swelling.       Hears thumping in ears if he runs up stairs  Gastrointestinal: Negative for nausea, vomiting, abdominal pain, constipation and blood in stool.       Prilosec controls heartburn  Genitourinary: Negative for frequency and difficulty  urinating.       Occ dribbling Some urgency and slowing of stream No sexual problems  Musculoskeletal: Negative for back pain, joint swelling and arthralgias.  Skin: Negative for rash.       No suspicious areas Sees derm regularly  Neurological: Positive for numbness. Negative for dizziness, syncope, weakness, light-headedness and headaches.       Awakens at times with right hand numbness--fades away  Hematological: Negative for adenopathy. Bruises/bleeds easily.       Easy bruising since on aspirin  Psychiatric/Behavioral: Positive for sleep disturbance. Negative for dysphoric mood. The patient is not nervous/anxious.        Generally sleeps okay---no CPAP but is using oral appliance (sporadically)       Objective:   Physical Exam  Constitutional: He is oriented to person, place, and time. He appears well-developed and well-nourished. No distress.  HENT:  Head: Normocephalic and atraumatic.  Right Ear: External  ear normal.  Left Ear: External ear normal.  Mouth/Throat: Oropharynx is clear and moist. No oropharyngeal exudate.       TMs normal  Eyes: Conjunctivae and EOM are normal. Pupils are equal, round, and reactive to light.       Fundi benign  Neck: Normal range of motion. Neck supple. No thyromegaly present.  Cardiovascular: Normal rate, regular rhythm, normal heart sounds and intact distal pulses.  Exam reveals no gallop.   No murmur heard. Pulmonary/Chest: Effort normal and breath sounds normal. No respiratory distress. He has no wheezes. He has no rales.  Abdominal: Soft. He exhibits no mass. There is no tenderness.  Musculoskeletal: Normal range of motion. He exhibits no edema and no tenderness.  Lymphadenopathy:    He has no cervical adenopathy.  Neurological: He is alert and oriented to person, place, and time. He exhibits normal muscle tone.       Normal gait and strength  Skin: Skin is warm. No rash noted.  Psychiatric: He has a normal mood and affect. His behavior is normal. Judgment and thought content normal.          Assessment & Plan:

## 2010-10-20 NOTE — Assessment & Plan Note (Signed)
Has done well  No symptoms On statin, asa, ARB Didn't tolerate beta blocker

## 2010-10-20 NOTE — Progress Notes (Signed)
Addended by: Sueanne Margarita on: 10/20/2010 10:27 AM   Modules accepted: Orders

## 2011-06-22 ENCOUNTER — Ambulatory Visit (INDEPENDENT_AMBULATORY_CARE_PROVIDER_SITE_OTHER): Payer: BC Managed Care – PPO | Admitting: Family Medicine

## 2011-06-22 ENCOUNTER — Encounter: Payer: Self-pay | Admitting: Family Medicine

## 2011-06-22 VITALS — BP 140/80 | HR 84 | Temp 97.7°F | Wt 193.0 lb

## 2011-06-22 DIAGNOSIS — J309 Allergic rhinitis, unspecified: Secondary | ICD-10-CM

## 2011-06-22 DIAGNOSIS — J329 Chronic sinusitis, unspecified: Secondary | ICD-10-CM | POA: Insufficient documentation

## 2011-06-22 MED ORDER — FLUTICASONE PROPIONATE 50 MCG/ACT NA SUSP: 2.0000 | Freq: Every day | NASAL | Status: DC

## 2011-06-22 MED ORDER — AMOXICILLIN-POT CLAVULANATE 875-125 MG PO TABS: 1.0000 | ORAL_TABLET | Freq: Two times a day (BID) | ORAL | Status: AC

## 2011-06-22 NOTE — Patient Instructions (Signed)
Take Augmentin as directed- 1 tablet by mouth twice daily for 10 days.   Start Flonase. Drink lots of fluids.  Treat sympotmatically with loratadine, Mucinex, nasal saline irrigation, and Tylenol/Ibuprofen.  Call if not improving as expected in 5-7 days.

## 2011-06-22 NOTE — Progress Notes (Signed)
Subjective:    Patient ID: Randall Reeves, male    DOB: 1946/01/08, 66 y.o.   MRN: [id number]  HPI  66 yo pt of Dr. Alphonsus Sias here for ? Sinus infection.  Past 3-4 weeks, nose is running constantly.  Sneezing all the time, eyes watering.  Taking OTC loratadine and mucinex which were helping until a week or two ago.  Having increased sinus pressure.  No fevers. Teeth and ears hurts.  Mild non productive cough.  No SOB or CP.  Patient Active Problem List  Diagnoses  . HYPERLIPIDEMIA  . OBSTRUCTIVE SLEEP APNEA  . HYPERTENSION  . ALLERGIC RHINITIS  . GERD  . CAD (coronary artery disease)  . Routine general medical examination at a health care facility   Past Medical History  Diagnosis Date  . Allergy   . GERD (gastroesophageal reflux disease)   . Hypertension   . Hyperlipidemia   . Obstructive sleep apnea   . Coronary atherosclerosis of unspecified type of vessel, native or graft 2011    MI but cath benign. Dr Maureen Chatters   Past Surgical History  Procedure Date  . Spermatocele repair    History  Substance Use Topics  . Smoking status: Never Smoker   . Smokeless tobacco: Never Used  . Alcohol Use: Yes   Family History  Problem Relation Age of Onset  . Stroke Mother   . Hypertension Mother   . Diabetes Father   . Asthma Father   . Heart failure Father   . Hypertension Sister   . Neuropathy Brother   . Hypertension Brother   . Hypertension Brother   . Hypertension Brother   . Hypertension Brother   . Cancer Brother     prostate   Allergies  Allergen Reactions  . Bisoprolol-Hydrochlorothiazide     REACTION: muscle pain, cramps   Current Outpatient Prescriptions on File Prior to Visit  Medication Sig Dispense Refill  . aspirin 81 MG tablet Take 81 mg by mouth daily.        Marland Kitchen atorvastatin (LIPITOR) 40 MG tablet Take 20 mg by mouth daily.       . fish oil-omega-3 fatty acids 1000 MG capsule Take 1 g by mouth daily.       Marland Kitchen losartan-hydrochlorothiazide (HYZAAR)  100-25 MG per tablet Take 1 tablet by mouth daily.        . Multiple Vitamin (MULTIVITAMIN) tablet Take 1 tablet by mouth daily.        . nitroGLYCERIN (NITROSTAT) 0.4 MG SL tablet Place 0.4 mg under the tongue every 5 (five) minutes as needed.        Marland Kitchen omeprazole (PRILOSEC) 20 MG capsule Take 20 mg by mouth daily.        . fluticasone (FLONASE) 50 MCG/ACT nasal spray Place 2 sprays into the nose daily.  16 g  6   The PMH, PSH, Social History, Family History, Medications, and allergies have been reviewed in [facility], and have been updated if relevant.    Review of Systems See HPI    Objective:   Physical Exam BP 140/80  Pulse 84  Temp(Src) 97.7 F (36.5 C) (Oral)  Wt 193 lb (87.544 kg) General:  pleasant male in NAD Eyes:  PERRL Ears:  TMs dull bilaterally Nose:  Erythema, boggy turbinates, frontal sinuses TTP throughout Mouth:  Oral mucosa and oropharynx without lesions or exudates.  Teeth in good repair. Neck:  no carotid bruit or thyromegaly no cervical or supraclavicular lymphadenopathy  Lungs:  Normal  respiratory effort, chest expands symmetrically. Lungs are clear to auscultation, no crackles or wheezes. Heart:  Normal rate and regular rhythm. S1 and S2 normal without gallop, murmur, click, rub or other extra sounds. Pulses:  R and L posterior tibial pulses are full and equal bilaterally  Extremities:  no edema      Assessment & Plan:   1. ALLERGIC RHINITIS  Deteriorated. Advised trying allegra. Will add flonase. Follow up with PCP if no improvement as discussed.  2. Sinusitis  Given duration and progression of symptoms, will treat for bacterial sinusitis with Augmentin. See pt instructions for details.

## 2011-07-05 ENCOUNTER — Ambulatory Visit (INDEPENDENT_AMBULATORY_CARE_PROVIDER_SITE_OTHER): Payer: BC Managed Care – PPO | Admitting: Internal Medicine

## 2011-07-05 ENCOUNTER — Telehealth: Payer: Self-pay | Admitting: Internal Medicine

## 2011-07-05 ENCOUNTER — Encounter: Payer: Self-pay | Admitting: Internal Medicine

## 2011-07-05 VITALS — BP 108/78 | HR 82 | Temp 98.2°F | Wt 191.0 lb

## 2011-07-05 DIAGNOSIS — H9209 Otalgia, unspecified ear: Secondary | ICD-10-CM

## 2011-07-05 MED ORDER — PREDNISONE 20 MG PO TABS: 40.0000 mg | ORAL_TABLET | Freq: Every day | ORAL | Status: AC

## 2011-07-05 NOTE — Telephone Encounter (Signed)
Caller: Keno/Patient; PCP: Tillman Abide I.; CB#: [phone];  Call regarding  Earache, Sinus Pain/ Headache rated at 8-9; increased pain onset 07/02/11.  Emergent sx  ruled out.  Home care for the interim and  see in 4 hours per Ear:  Sx protocol.  Appt w Dr. Alphonsus Sias at 1630 on 07/05/11.

## 2011-07-05 NOTE — Progress Notes (Signed)
Subjective:    Patient ID: Randall Reeves, male    DOB: 01-Mar-1945, 66 y.o.   MRN: [id number]  HPI Sinus congestion did seem to clear up Mucus is now clear Now with ear and head pain Fairly constant but worse at times---no clear exacerbating features Seems worse if still---but Esguerra not actually be different  No fever Some cough---only a little now No sob Current Outpatient Prescriptions on File Prior to Visit  Medication Sig Dispense Refill  . aspirin 81 MG tablet Take 81 mg by mouth daily.        Marland Kitchen atorvastatin (LIPITOR) 40 MG tablet Take 20 mg by mouth daily.       . fish oil-omega-3 fatty acids 1000 MG capsule Take 1 g by mouth daily.       . fluticasone (FLONASE) 50 MCG/ACT nasal spray Place 2 sprays into the nose daily.  16 g  6  . losartan-hydrochlorothiazide (HYZAAR) 100-25 MG per tablet Take 1 tablet by mouth daily.        . Multiple Vitamin (MULTIVITAMIN) tablet Take 1 tablet by mouth daily.        . nitroGLYCERIN (NITROSTAT) 0.4 MG SL tablet Place 0.4 mg under the tongue every 5 (five) minutes as needed.        Marland Kitchen omeprazole (PRILOSEC) 20 MG capsule Take 20 mg by mouth daily.          Allergies  Allergen Reactions  . Bisoprolol-Hydrochlorothiazide     REACTION: muscle pain, cramps    Past Medical History  Diagnosis Date  . Allergy   . GERD (gastroesophageal reflux disease)   . Hypertension   . Hyperlipidemia   . Obstructive sleep apnea   . Coronary atherosclerosis of unspecified type of vessel, native or graft 2011    MI but cath benign. Dr Maureen Chatters    Past Surgical History  Procedure Date  . Spermatocele repair     Family History  Problem Relation Age of Onset  . Stroke Mother   . Hypertension Mother   . Diabetes Father   . Asthma Father   . Heart failure Father   . Hypertension Sister   . Neuropathy Brother   . Hypertension Brother   . Hypertension Brother   . Hypertension Brother   . Hypertension Brother   . Cancer Brother     prostate    History    Social History  . Marital Status: Married    Spouse Name: N/A    Number of Children: 2  . Years of Education: N/A   Occupational History  . landscaping    Social History Main Topics  . Smoking status: Never Smoker   . Smokeless tobacco: Never Used  . Alcohol Use: Yes  . Drug Use: No  . Sexually Active: Not on file   Other Topics Concern  . Not on file   Social History Narrative  . No narrative on file   Review of Systems Some increase in acid reflux at end of antibiotic course Tried yogurt and one dose of antacid Some itching sensation in ear also No recent travel     Objective:   Physical Exam  Constitutional: He appears well-developed and well-nourished. No distress.  HENT:       Slight maxillary tenderness Marked pale nasal congestion on left, moderate on right TMs look normal--Ramesh have some retraction  Neck: Normal range of motion. Neck supple. No thyromegaly present.  Pulmonary/Chest: Effort normal and breath sounds normal. No respiratory distress. He  has no wheezes. He has no rales.  Lymphadenopathy:    He has no cervical adenopathy.          Assessment & Plan:

## 2011-07-05 NOTE — Assessment & Plan Note (Signed)
Seems to be pressure related With ongoing nasal congestion, Oloughlin be eustachian tube related Infection seems better  Will continue the fluticasone and add course of prednisone To ENT if persists

## 2011-07-05 NOTE — Telephone Encounter (Signed)
Will evaluate then 

## 2011-07-05 NOTE — Patient Instructions (Signed)
Please continue the fluticasone spray for nose Take the 10 days of prednisone as prescribed If symptoms are not better, call and I will refer you to an ear specialist

## 2011-08-27 ENCOUNTER — Telehealth: Payer: Self-pay

## 2011-08-27 DIAGNOSIS — H9209 Otalgia, unspecified ear: Secondary | ICD-10-CM

## 2011-08-27 NOTE — Telephone Encounter (Signed)
Pt wife request ENT referral; pt seen 07/05/11, ear problem comes and goes but pt developed dizziness and ears hurting again yesterday and request ENT referral to [facility] or [place].Please advise.

## 2011-08-27 NOTE — Telephone Encounter (Signed)
Please go ahead and set up the referral

## 2011-10-28 ENCOUNTER — Encounter: Payer: Self-pay | Admitting: Internal Medicine

## 2011-10-28 ENCOUNTER — Ambulatory Visit (INDEPENDENT_AMBULATORY_CARE_PROVIDER_SITE_OTHER): Payer: BC Managed Care – PPO | Admitting: Internal Medicine

## 2011-10-28 VITALS — BP 130/88 | HR 84 | Temp 98.1°F | Ht 69.0 in | Wt 187.5 lb

## 2011-10-28 DIAGNOSIS — Z0000 Encounter for general adult medical examination without abnormal findings: Secondary | ICD-10-CM

## 2011-10-28 DIAGNOSIS — E785 Hyperlipidemia, unspecified: Secondary | ICD-10-CM

## 2011-10-28 DIAGNOSIS — I2510 Atherosclerotic heart disease of native coronary artery without angina pectoris: Secondary | ICD-10-CM

## 2011-10-28 DIAGNOSIS — I10 Essential (primary) hypertension: Secondary | ICD-10-CM

## 2011-10-28 NOTE — Progress Notes (Signed)
Subjective:    Patient ID: Randall Reeves, male    DOB: Zimmerle 31, 1947, 66 y.o.   MRN: [id number]  HPI Here for physical Did go to Dr Andee Poles for his ear. 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 aspirin 81 MG tablet Take 81 mg by mouth daily.        Marland Kitchen atorvastatin (LIPITOR) 40 MG tablet Take 20 mg by mouth daily.       . fish oil-omega-3 fatty acids 1000 MG capsule Take 1 g by mouth daily.       Marland Kitchen losartan (COZAAR) 50 MG tablet Take 50 mg by mouth daily.      . Multiple Vitamin (MULTIVITAMIN) tablet Take 1 tablet by mouth daily.        Marland Kitchen omeprazole (PRILOSEC) 20 MG capsule Take 20 mg by mouth daily.        . fluticasone (FLONASE) 50 MCG/ACT nasal spray Place 2 sprays into the nose daily.  16 g  6  . nitroGLYCERIN (NITROSTAT) 0.4 MG SL tablet Place 0.4 mg under the tongue every 5 (five) minutes as needed.          Allergies  Allergen Reactions  . Bisoprolol-Hydrochlorothiazide     REACTION: muscle pain, cramps    Past Medical History  Diagnosis Date  . Allergy   . GERD (gastroesophageal reflux disease)   . Hypertension   . Hyperlipidemia   . Obstructive sleep apnea   . Coronary atherosclerosis of unspecified type of vessel, native or graft 2011    MI but cath benign. Dr Maureen Chatters    Past Surgical History  Procedure Date  . Spermatocele repair     Family History  Problem Relation Age of Onset  . Stroke Mother   . Hypertension Mother   . Diabetes Father   . Asthma Father   . Heart failure Father   . Hypertension Sister     . Neuropathy Brother   . Hypertension Brother   . Hypertension Brother   . Hypertension Brother   . Hypertension Brother   . Cancer Brother     prostate    History   Social History  . Marital Status: Married    Spouse Name: N/A    Number of Children: 2  . Years of Education: N/A   Occupational History  . landscaping    Social History Main Topics  . Smoking status: Never Smoker   . Smokeless tobacco: Never Used  . Alcohol Use: Yes     a beer a day  . Drug Use: No  . Sexually Active: Not on file   Other Topics Concern  . Not on file   Social History Narrative  . No narrative on file   Review of Systems  Constitutional: Negative for fatigue and unexpected weight change.       Weight is fairly stable--down 9#, then up 3# again Wears seat belt  HENT: Positive for hearing loss, congestion, rhinorrhea and tinnitus. Negative for dental problem.  Keeps up with dentist  Eyes: Negative for visual disturbance.       No diplopia or unilateral vision loss  Respiratory: Negative for cough, chest tightness and shortness of breath.   Cardiovascular: Negative for chest pain, palpitations and leg swelling.       Has some vague chest sensations---don't seem to mean much to him  Gastrointestinal: Negative for nausea, vomiting, abdominal pain, constipation and blood in stool.       Heartburn controlled with the PPI  Genitourinary: Positive for urgency and frequency. Negative for difficulty urinating.       Some ED but not severe He would consider meds but wants to think about it  Musculoskeletal: Positive for arthralgias. Negative for back pain and joint swelling.       Mild joint symptoms--esp fingers  Neurological: Positive for numbness. Negative for dizziness, syncope, weakness and light-headedness.       Arms get numb at night---can shake them out--thinks he Henion be holding them over his head  Hematological: Negative for adenopathy. Bruises/bleeds easily.       Got bruise  on shin about a month ago--hasn't gone away yet  Psychiatric/Behavioral: Positive for disturbed wake/sleep cycle. Negative for dysphoric mood. The patient is not nervous/anxious.        Has intermittent bad nights sleeping---has been trying to cut back on caffeine. Chronic problem       Objective:   Physical Exam  Constitutional: He is oriented to person, place, and time. He appears well-developed and well-nourished. No distress.  HENT:  Head: Normocephalic and atraumatic.  Right Ear: External ear normal.  Left Ear: External ear normal.  Mouth/Throat: Oropharynx is clear and moist. No oropharyngeal exudate.  Eyes: Conjunctivae normal and EOM are normal. Pupils are equal, round, and reactive to light.  Neck: Normal range of motion. Neck supple. No thyromegaly present.  Cardiovascular: Normal rate, regular rhythm, normal heart sounds and intact distal pulses.  Exam reveals no gallop.   No murmur heard. Pulmonary/Chest: Effort normal and breath sounds normal. No respiratory distress. He has no wheezes. He has no rales.  Abdominal: Soft. There is no tenderness.  Musculoskeletal: He exhibits no edema and no tenderness.  Lymphadenopathy:    He has no cervical adenopathy.  Neurological: He is alert and oriented to person, place, and time.  Skin: No rash noted. No erythema.       Small raised area over distal tibia--non tender Scaly area on right pinna  Psychiatric: He has a normal mood and affect. His behavior is normal.          Assessment & Plan:

## 2011-10-28 NOTE — Assessment & Plan Note (Signed)
Has been quiet Feels better now that he is on less meds

## 2011-10-28 NOTE — Patient Instructions (Signed)
Please set up appointment with dermatologist (Dr Adolphus Birchwood or Roseanne Kaufman) to check that area on right ear

## 2011-10-28 NOTE — Assessment & Plan Note (Signed)
Lab Results  Component Value Date   LDLCALC 75 10/20/2010   Due for labs

## 2011-10-28 NOTE — Assessment & Plan Note (Signed)
BP Readings from Last 3 Encounters:  10/28/11 130/88  07/05/11 108/78  06/22/11 140/80   Good control despite less meds No changes for now

## 2011-10-28 NOTE — Assessment & Plan Note (Signed)
Fairly healthy He prefers no flu shot or pneumovax Discussed PSA---will defer to at least next year

## 2011-10-29 LAB — CBC WITH DIFFERENTIAL/PLATELET
Basophils Absolute: 0 10*3/uL (ref 0.0–0.1)
Hemoglobin: 14 g/dL (ref 13.0–17.0)
Lymphocytes Relative: 21.1 % (ref 12.0–46.0)
Monocytes Relative: 13.9 % — ABNORMAL HIGH (ref 3.0–12.0)
Neutro Abs: 3.5 10*3/uL (ref 1.4–7.7)
RBC: 4.47 Mil/uL (ref 4.22–5.81)
RDW: 13 % (ref 11.5–14.6)

## 2011-10-29 LAB — LIPID PANEL
HDL: 46.1 mg/dL (ref >39.00)
Total CHOL/HDL Ratio: 4
Triglycerides: 242 mg/dL — ABNORMAL HIGH (ref 0.0–149.0)

## 2011-10-29 LAB — BASIC METABOLIC PANEL
Calcium: 9.3 mg/dL (ref 8.4–10.5)
GFR: 41.1 mL/min — ABNORMAL LOW (ref >60.00)
Glucose, Bld: 75 mg/dL (ref 70–99)
Sodium: 139 mEq/L (ref 135–145)

## 2011-10-29 LAB — HEPATIC FUNCTION PANEL
Alkaline Phosphatase: 68 U/L (ref 39–117)
Bilirubin, Direct: 0 mg/dL (ref 0.0–0.3)
Total Protein: 7.4 g/dL (ref 6.0–8.3)

## 2011-10-29 LAB — TSH: TSH: 1.44 u[IU]/mL (ref 0.35–5.50)

## 2011-11-05 ENCOUNTER — Encounter: Payer: Self-pay | Admitting: *Deleted

## 2011-11-17 ENCOUNTER — Other Ambulatory Visit: Payer: Self-pay | Admitting: Internal Medicine

## 2011-11-17 DIAGNOSIS — N289 Disorder of kidney and ureter, unspecified: Secondary | ICD-10-CM

## 2011-11-23 ENCOUNTER — Other Ambulatory Visit (INDEPENDENT_AMBULATORY_CARE_PROVIDER_SITE_OTHER): Payer: BC Managed Care – PPO

## 2011-11-23 DIAGNOSIS — N289 Disorder of kidney and ureter, unspecified: Secondary | ICD-10-CM

## 2011-11-23 LAB — BASIC METABOLIC PANEL
BUN: 25 mg/dL — ABNORMAL HIGH (ref 6–23)
CO2: 29 mEq/L (ref 19–32)
Chloride: 102 mEq/L (ref 96–112)
Glucose, Bld: 83 mg/dL (ref 70–99)
Potassium: 4.3 mEq/L (ref 3.5–5.1)

## 2012-01-04 ENCOUNTER — Encounter: Payer: BC Managed Care – PPO | Admitting: Internal Medicine

## 2012-06-09 ENCOUNTER — Encounter: Payer: Self-pay | Admitting: Internal Medicine

## 2012-06-09 ENCOUNTER — Ambulatory Visit (INDEPENDENT_AMBULATORY_CARE_PROVIDER_SITE_OTHER): Payer: BC Managed Care – PPO | Admitting: Internal Medicine

## 2012-06-09 VITALS — BP 140/90 | HR 79 | Temp 97.6°F | Ht 69.0 in | Wt 189.2 lb

## 2012-06-09 DIAGNOSIS — R3915 Urgency of urination: Secondary | ICD-10-CM | POA: Insufficient documentation

## 2012-06-09 DIAGNOSIS — H8110 Benign paroxysmal vertigo, unspecified ear: Secondary | ICD-10-CM

## 2012-06-09 DIAGNOSIS — N529 Male erectile dysfunction, unspecified: Secondary | ICD-10-CM

## 2012-06-09 MED ORDER — MECLIZINE HCL 25 MG PO TABS: 25.0000 mg | ORAL_TABLET | Freq: Three times a day (TID) | ORAL | Status: DC | PRN

## 2012-06-09 MED ORDER — TADALAFIL 5 MG PO TABS: 5.0000 mg | ORAL_TABLET | Freq: Every day | ORAL | Status: DC

## 2012-06-09 NOTE — Progress Notes (Signed)
Subjective:    Patient ID: Randall Reeves Below, male    DOB: January 29, 1945, 67 y.o.   MRN: [id number]  HPI Having trouble with dizziness again noted more nasal drainage and congestion before--now more around his ears Feels bad--nausea and dizziness Ears feel full Soreness in jaws and teeth like sinus--but not the congestion or purulent drainage  Just started again yesterday Took nap and couldn't walk upon arising Spinning with looking down--better today with adjustments in head movement No diplopia Did take OTC "antiemetic" -- 2 did help some  Current Outpatient Prescriptions on File Prior to Visit  Medication Sig Dispense Refill  . aspirin 81 MG tablet Take 81 mg by mouth daily.        Marland Kitchen atorvastatin (LIPITOR) 40 MG tablet Take 20 mg by mouth daily.       . fish oil-omega-3 fatty acids 1000 MG capsule Take 1 g by mouth daily.       Marland Kitchen losartan (COZAAR) 50 MG tablet Take 50 mg by mouth daily.      . Multiple Vitamin (MULTIVITAMIN) tablet Take 1 tablet by mouth daily.       Marland Kitchen omeprazole (PRILOSEC) 20 MG capsule Take 20 mg by mouth daily.        . fluticasone (FLONASE) 50 MCG/ACT nasal spray Place 2 sprays into the nose daily as needed.      . nitroGLYCERIN (NITROSTAT) 0.4 MG SL tablet Place 0.4 mg under the tongue every 5 (five) minutes as needed.         No current facility-administered medications on file prior to visit.    Allergies  Allergen Reactions  . Bisoprolol-Hydrochlorothiazide     REACTION: muscle pain, cramps    Past Medical History  Diagnosis Date  . Allergy   . GERD (gastroesophageal reflux disease)   . Hypertension   . Hyperlipidemia   . Obstructive sleep apnea   . Coronary atherosclerosis of unspecified type of vessel, native or graft 2011    MI but cath benign. Dr Maureen Chatters    Past Surgical History  Procedure Laterality Date  . Spermatocele repair      Family History  Problem Relation Age of Onset  . Stroke Mother   . Hypertension Mother   . Diabetes Father    . Asthma Father   . Heart failure Father   . Hypertension Sister   . Neuropathy Brother   . Hypertension Brother   . Hypertension Brother   . Hypertension Brother   . Hypertension Brother   . Cancer Brother     prostate    History   Social History  . Marital Status: Married    Spouse Name: N/A    Number of Children: 2  . Years of Education: N/A   Occupational History  . landscaping    Social History Main Topics  . Smoking status: Never Smoker   . Smokeless tobacco: Never Used  . Alcohol Use: Yes     Comment: a beer a day  . Drug Use: No  . Sexually Active: Not on file   Other Topics Concern  . Not on file   Social History Narrative  . No narrative on file   Review of Systems Eating okay No diarrhea No fever    Objective:   Physical Exam  Constitutional: He is oriented to person, place, and time. He appears well-developed and well-nourished. No distress.  HENT:  Right Ear: External ear normal.  Left Ear: External ear normal.  Mouth/Throat: Oropharynx is  clear and moist. No oropharyngeal exudate.  TMs normal Mild nasal congestion  Eyes: Conjunctivae and EOM are normal. Pupils are equal, round, and reactive to light.  No nystagmus Discs are sharp  Neck: Normal range of motion. Neck supple.  Lymphadenopathy:    He has no cervical adenopathy.  Neurological: He is alert and oriented to person, place, and time. He has normal strength. He displays no atrophy and no tremor. No cranial nerve deficit. He exhibits normal muscle tone. He displays a negative Romberg sign. Coordination and gait normal.          Assessment & Plan:

## 2012-06-09 NOTE — Assessment & Plan Note (Signed)
This seems to be recurrence of same symptoms from last year No neuro findings that are concerning and history supports benign etiology Doesn't sound like he had MRI ---and I don't think one is indicated now  Will use meclizine till symptoms resolve

## 2012-06-09 NOTE — Patient Instructions (Signed)
Please try 10-20mg  of the cialis 1-2 hours before sex.  Please use the meclizine three times a day until your symptoms go away---then slowly wean off it.

## 2012-06-09 NOTE — Assessment & Plan Note (Signed)
He is noting more problems with this Will try cialis--- coupon given

## 2012-10-30 ENCOUNTER — Ambulatory Visit (INDEPENDENT_AMBULATORY_CARE_PROVIDER_SITE_OTHER): Payer: Medicare Other | Admitting: Internal Medicine

## 2012-10-30 ENCOUNTER — Encounter: Payer: Self-pay | Admitting: Internal Medicine

## 2012-10-30 VITALS — BP 128/70 | HR 87 | Temp 98.0°F | Ht 69.0 in | Wt 192.0 lb

## 2012-10-30 DIAGNOSIS — I10 Essential (primary) hypertension: Secondary | ICD-10-CM

## 2012-10-30 DIAGNOSIS — K219 Gastro-esophageal reflux disease without esophagitis: Secondary | ICD-10-CM

## 2012-10-30 DIAGNOSIS — Z0000 Encounter for general adult medical examination without abnormal findings: Secondary | ICD-10-CM

## 2012-10-30 DIAGNOSIS — R3915 Urgency of urination: Secondary | ICD-10-CM

## 2012-10-30 DIAGNOSIS — Z23 Encounter for immunization: Secondary | ICD-10-CM

## 2012-10-30 DIAGNOSIS — I2510 Atherosclerotic heart disease of native coronary artery without angina pectoris: Secondary | ICD-10-CM

## 2012-10-30 DIAGNOSIS — E785 Hyperlipidemia, unspecified: Secondary | ICD-10-CM

## 2012-10-30 NOTE — Assessment & Plan Note (Signed)
No problems with statin 

## 2012-10-30 NOTE — Assessment & Plan Note (Signed)
No problems with the PPI

## 2012-10-30 NOTE — Assessment & Plan Note (Signed)
Healthy Will give pneumovax Check PSA after discussion Discussed fitness

## 2012-10-30 NOTE — Progress Notes (Signed)
Subjective:    Patient ID: Randall Reeves, male    DOB: 12/14/1945, 67 y.o.   MRN: [id number]  HPI Here for physical Reviewed advanced directives Still with mild vertigo---takes meclizine once a day usually Still working part time  No problems with heart Tries to exercise ---variable with work schedule  Current Outpatient Prescriptions on File Prior to Visit  Medication Sig Dispense Refill  . aspirin 81 MG tablet Take 81 mg by mouth daily.        Marland Kitchen atorvastatin (LIPITOR) 40 MG tablet Take 20 mg by mouth daily.       . fluticasone (FLONASE) 50 MCG/ACT nasal spray Place 2 sprays into the nose daily as needed.      Marland Kitchen losartan (COZAAR) 50 MG tablet Take 50 mg by mouth daily.      . Multiple Vitamin (MULTIVITAMIN) tablet Take 1 tablet by mouth daily.       Marland Kitchen omeprazole (PRILOSEC) 20 MG capsule Take 20 mg by mouth daily.         No current facility-administered medications on file prior to visit.    Allergies  Allergen Reactions  . Bisoprolol-Hydrochlorothiazide     REACTION: muscle pain, cramps    Past Medical History  Diagnosis Date  . Allergy   . GERD (gastroesophageal reflux disease)   . Hypertension   . Hyperlipidemia   . Obstructive sleep apnea   . Coronary atherosclerosis of unspecified type of vessel, native or graft 2011    MI but cath benign. Dr Maureen Chatters    Past Surgical History  Procedure Laterality Date  . Spermatocele repair      Family History  Problem Relation Age of Onset  . Stroke Mother   . Hypertension Mother   . Diabetes Father   . Asthma Father   . Heart failure Father   . Hypertension Sister   . Neuropathy Brother   . Hypertension Brother   . Hypertension Brother   . Hypertension Brother   . Hypertension Brother   . Cancer Brother     prostate    History   Social History  . Marital Status: Married    Spouse Name: N/A    Number of Children: 2  . Years of Education: N/A   Occupational History  . landscaping    Social History Main  Topics  . Smoking status: Never Smoker   . Smokeless tobacco: Never Used  . Alcohol Use: Yes     Comment: a beer a day  . Drug Use: No  . Sexual Activity: Not on file   Other Topics Concern  . Not on file   Social History Narrative   Has living will   Requests wife as health care POA.   Would accept resuscitation attempts.   Would leave feeding tube decision to his wife   Review of Systems  Constitutional: Negative for fatigue and unexpected weight change.       Wears seat belt  HENT: Positive for hearing loss, congestion and rhinorrhea. Negative for tinnitus.        Mild hearing problems--mostly noted by wife Regular with dentist--some dry mouth but not severe  Eyes: Negative for visual disturbance.       No diplopia or unilateral vision loss  Respiratory: Negative for cough, chest tightness and shortness of breath.   Cardiovascular: Positive for palpitations. Negative for chest pain and leg swelling.       Rare racing heart after coffee  Gastrointestinal: Negative for nausea, vomiting,  abdominal pain, constipation and blood in stool.       Heartburn generally controlled with daily omeprazole (unless he overdoes it with tomatoes)   Endocrine: Positive for cold intolerance. Negative for heat intolerance.  Genitourinary: Positive for urgency and frequency. Negative for difficulty urinating.       ED is fine with just 5mg  prn  Musculoskeletal: Positive for arthralgias. Negative for back pain and joint swelling.       Right shoulder pain in past 3 months--occ takes ibuprofen (Normoyle help)  Skin: Negative for rash.       Sees Dr Adolphus Birchwood  Allergic/Immunologic: Positive for environmental allergies. Negative for immunocompromised state.       Reasonable control with loratadine prn  Neurological: Positive for numbness. Negative for syncope, weakness, light-headedness and headaches.       Arms go to sleep at night---thinks he puts the right hand under his head  Hematological: Negative  for adenopathy. Bruises/bleeds easily.       Tiny bleeding spots from aspirin  Psychiatric/Behavioral: Negative for sleep disturbance and dysphoric mood. The patient is not nervous/anxious.        Sleeps fine on his side---no apparent apnea then       Objective:   Physical Exam  Constitutional: He is oriented to person, place, and time. He appears well-developed and well-nourished. No distress.  HENT:  Head: Normocephalic and atraumatic.  Right Ear: External ear normal.  Left Ear: External ear normal.  Mouth/Throat: Oropharynx is clear and moist. No oropharyngeal exudate.  Eyes: Conjunctivae and EOM are normal. Pupils are equal, round, and reactive to light.  Neck: Normal range of motion. Neck supple. No thyromegaly present.  Cardiovascular: Normal rate, regular rhythm, normal heart sounds and intact distal pulses.  Exam reveals no gallop.   No murmur heard. Pulmonary/Chest: Effort normal and breath sounds normal. No respiratory distress. He has no wheezes. He has no rales.  Abdominal: Soft. There is no tenderness.  Musculoskeletal: He exhibits no edema and no tenderness.  Lymphadenopathy:    He has no cervical adenopathy.  Neurological: He is alert and oriented to person, place, and time.  Skin: No rash noted. No erythema.  Psychiatric: He has a normal mood and affect. His behavior is normal.          Assessment & Plan:

## 2012-10-30 NOTE — Assessment & Plan Note (Signed)
BP Readings from Last 3 Encounters:  10/30/12 128/70  06/09/12 140/90  10/28/11 130/88   Good control on heart meds

## 2012-10-30 NOTE — Assessment & Plan Note (Signed)
No symptoms 

## 2012-10-31 LAB — CBC WITH DIFFERENTIAL/PLATELET
Eosinophils Relative: 2.9 % (ref 0.0–5.0)
HCT: 42.9 % (ref 39.0–52.0)
Hemoglobin: 14.8 g/dL (ref 13.0–17.0)
Lymphocytes Relative: 25.8 % (ref 12.0–46.0)
Lymphs Abs: 1.5 10*3/uL (ref 0.7–4.0)
Monocytes Relative: 12.3 % — ABNORMAL HIGH (ref 3.0–12.0)
Neutro Abs: 3.3 10*3/uL (ref 1.4–7.7)
WBC: 5.7 10*3/uL (ref 4.5–10.5)

## 2012-10-31 LAB — HEPATIC FUNCTION PANEL
ALT: 20 U/L (ref 0–53)
AST: 20 U/L (ref 0–37)
Alkaline Phosphatase: 76 U/L (ref 39–117)
Bilirubin, Direct: 0 mg/dL (ref 0.0–0.3)

## 2012-10-31 LAB — LIPID PANEL
Total CHOL/HDL Ratio: 4
VLDL: 69.2 mg/dL — ABNORMAL HIGH (ref 0.0–40.0)

## 2012-10-31 LAB — PSA: PSA: 0.57 ng/mL (ref 0.10–4.00)

## 2012-10-31 LAB — BASIC METABOLIC PANEL
GFR: 49.15 mL/min — ABNORMAL LOW (ref >60.00)
Potassium: 4.2 mEq/L (ref 3.5–5.1)
Sodium: 139 mEq/L (ref 135–145)

## 2012-10-31 LAB — LDL CHOLESTEROL, DIRECT: Direct LDL: 81.9 mg/dL

## 2012-10-31 NOTE — Addendum Note (Signed)
Addended by: Sueanne Margarita on: 10/31/2012 12:51 PM   Modules accepted: Orders

## 2012-11-22 ENCOUNTER — Other Ambulatory Visit: Payer: Self-pay | Admitting: Cardiology

## 2012-11-23 ENCOUNTER — Emergency Department (HOSPITAL_COMMUNITY): Payer: Medicare Other

## 2012-11-23 ENCOUNTER — Inpatient Hospital Stay (HOSPITAL_COMMUNITY)
Admission: EM | Admit: 2012-11-23 | Discharge: 2012-11-25 | DRG: 086 | Disposition: A | Discharge status: Home / Self Care | Payer: Medicare Other | Attending: Surgery | Admitting: Surgery

## 2012-11-23 ENCOUNTER — Encounter (HOSPITAL_COMMUNITY): Payer: Self-pay | Admitting: Emergency Medicine

## 2012-11-23 DIAGNOSIS — G4733 Obstructive sleep apnea (adult) (pediatric): Secondary | ICD-10-CM | POA: Diagnosis present

## 2012-11-23 DIAGNOSIS — S069X9A Unspecified intracranial injury with loss of consciousness of unspecified duration, initial encounter: Secondary | ICD-10-CM

## 2012-11-23 DIAGNOSIS — R079 Chest pain, unspecified: Secondary | ICD-10-CM | POA: Diagnosis present

## 2012-11-23 DIAGNOSIS — I2510 Atherosclerotic heart disease of native coronary artery without angina pectoris: Secondary | ICD-10-CM | POA: Diagnosis present

## 2012-11-23 DIAGNOSIS — S62101A Fracture of unspecified carpal bone, right wrist, initial encounter for closed fracture: Secondary | ICD-10-CM

## 2012-11-23 DIAGNOSIS — R55 Syncope and collapse: Secondary | ICD-10-CM | POA: Diagnosis present

## 2012-11-23 DIAGNOSIS — S2239XA Fracture of one rib, unspecified side, initial encounter for closed fracture: Secondary | ICD-10-CM | POA: Diagnosis present

## 2012-11-23 DIAGNOSIS — R404 Transient alteration of awareness: Secondary | ICD-10-CM | POA: Diagnosis present

## 2012-11-23 DIAGNOSIS — W19XXXD Unspecified fall, subsequent encounter: Secondary | ICD-10-CM

## 2012-11-23 DIAGNOSIS — S2242XA Multiple fractures of ribs, left side, initial encounter for closed fracture: Secondary | ICD-10-CM

## 2012-11-23 DIAGNOSIS — R091 Pleurisy: Secondary | ICD-10-CM | POA: Diagnosis present

## 2012-11-23 DIAGNOSIS — W11XXXA Fall on and from ladder, initial encounter: Secondary | ICD-10-CM | POA: Diagnosis present

## 2012-11-23 DIAGNOSIS — S069XAA Unspecified intracranial injury with loss of consciousness status unknown, initial encounter: Secondary | ICD-10-CM

## 2012-11-23 DIAGNOSIS — I10 Essential (primary) hypertension: Secondary | ICD-10-CM | POA: Diagnosis present

## 2012-11-23 DIAGNOSIS — I252 Old myocardial infarction: Secondary | ICD-10-CM

## 2012-11-23 DIAGNOSIS — I728 Aneurysm of other specified arteries: Secondary | ICD-10-CM | POA: Diagnosis present

## 2012-11-23 DIAGNOSIS — S62109A Fracture of unspecified carpal bone, unspecified wrist, initial encounter for closed fracture: Secondary | ICD-10-CM

## 2012-11-23 DIAGNOSIS — S2249XA Multiple fractures of ribs, unspecified side, initial encounter for closed fracture: Secondary | ICD-10-CM

## 2012-11-23 DIAGNOSIS — K219 Gastro-esophageal reflux disease without esophagitis: Secondary | ICD-10-CM | POA: Diagnosis present

## 2012-11-23 DIAGNOSIS — N179 Acute kidney failure, unspecified: Secondary | ICD-10-CM

## 2012-11-23 DIAGNOSIS — S06300A Unspecified focal traumatic brain injury without loss of consciousness, initial encounter: Principal | ICD-10-CM | POA: Diagnosis present

## 2012-11-23 DIAGNOSIS — R51 Headache: Secondary | ICD-10-CM | POA: Diagnosis present

## 2012-11-23 DIAGNOSIS — E785 Hyperlipidemia, unspecified: Secondary | ICD-10-CM | POA: Diagnosis present

## 2012-11-23 DIAGNOSIS — W19XXXA Unspecified fall, initial encounter: Secondary | ICD-10-CM

## 2012-11-23 LAB — POCT I-STAT, CHEM 8
BUN: 31 mg/dL — ABNORMAL HIGH (ref 6–23)
Creatinine, Ser: 1.7 mg/dL — ABNORMAL HIGH (ref 0.50–1.35)
Glucose, Bld: 98 mg/dL (ref 70–99)
Hemoglobin: 16 g/dL (ref 13.0–17.0)
Potassium: 4.4 mEq/L (ref 3.5–5.1)
Sodium: 140 mEq/L (ref 135–145)

## 2012-11-23 LAB — TYPE AND SCREEN: ABO/RH(D): O POS

## 2012-11-23 LAB — MRSA PCR SCREENING: MRSA by PCR: POSITIVE — AB

## 2012-11-23 LAB — CBC
MCH: 32.4 pg (ref 26.0–34.0)
MCV: 91 fL (ref 78.0–100.0)
Platelets: 208 10*3/uL (ref 150–400)
RBC: 4.79 MIL/uL (ref 4.22–5.81)
RDW: 12.3 % (ref 11.5–15.5)

## 2012-11-23 LAB — BASIC METABOLIC PANEL
CO2: 25 mEq/L (ref 19–32)
Calcium: 9.2 mg/dL (ref 8.4–10.5)
Chloride: 101 mEq/L (ref 96–112)
Creatinine, Ser: 1.4 mg/dL — ABNORMAL HIGH (ref 0.50–1.35)

## 2012-11-23 LAB — ABO/RH: ABO/RH(D): O POS

## 2012-11-23 MED ORDER — MECLIZINE HCL 25 MG PO TABS
25.0000 mg | ORAL_TABLET | Freq: Every day | ORAL | Status: DC | PRN
  Filled 2012-11-23: qty 1

## 2012-11-23 MED ORDER — ATORVASTATIN CALCIUM 20 MG PO TABS
20.0000 mg | ORAL_TABLET | Freq: Every day | ORAL | Status: DC
  Administered 2012-11-24 – 2012-11-25 (×2): 20 mg via ORAL
  Filled 2012-11-23 (×3): qty 1

## 2012-11-23 MED ORDER — OXYCODONE HCL 5 MG PO TABS
5.0000 mg | ORAL_TABLET | ORAL | Status: DC | PRN
  Administered 2012-11-24: 5 mg via ORAL
  Administered 2012-11-24: 10 mg via ORAL
  Filled 2012-11-23: qty 2
  Filled 2012-11-23: qty 1

## 2012-11-23 MED ORDER — POTASSIUM CHLORIDE IN NACL 20-0.9 MEQ/L-% IV SOLN
INTRAVENOUS | Status: DC
  Administered 2012-11-23 – 2012-11-24 (×2): via INTRAVENOUS
  Filled 2012-11-23 (×3): qty 1000

## 2012-11-23 MED ORDER — MORPHINE SULFATE 2 MG/ML IJ SOLN
INTRAMUSCULAR | Status: AC
  Filled 2012-11-23: qty 1

## 2012-11-23 MED ORDER — IOHEXOL 300 MG/ML  SOLN
80.0000 mL | Freq: Once | INTRAMUSCULAR | Status: AC | PRN
  Administered 2012-11-23: 80 mL via INTRAVENOUS

## 2012-11-23 MED ORDER — POLYETHYLENE GLYCOL 3350 17 G PO PACK
17.0000 g | PACK | Freq: Every day | ORAL | Status: DC
  Administered 2012-11-25: 17 g via ORAL
  Filled 2012-11-23 (×3): qty 1

## 2012-11-23 MED ORDER — MORPHINE SULFATE 2 MG/ML IJ SOLN
2.0000 mg | INTRAMUSCULAR | Status: DC | PRN
  Administered 2012-11-23: 2 mg via INTRAVENOUS

## 2012-11-23 MED ORDER — SODIUM CHLORIDE 0.9 % IV SOLN
Freq: Once | INTRAVENOUS | Status: AC
Start: 2012-11-23 — End: 2012-11-23
  Administered 2012-11-23: 14:00:00 via INTRAVENOUS

## 2012-11-23 MED ORDER — DOCUSATE SODIUM 100 MG PO CAPS
100.0000 mg | ORAL_CAPSULE | Freq: Two times a day (BID) | ORAL | Status: DC
  Administered 2012-11-23 – 2012-11-25 (×4): 100 mg via ORAL
  Filled 2012-11-23 (×5): qty 1

## 2012-11-23 MED ORDER — ONDANSETRON HCL 4 MG PO TABS: 4.0000 mg | ORAL_TABLET | Freq: Four times a day (QID) | ORAL | Status: DC | PRN

## 2012-11-23 MED ORDER — MUPIROCIN 2 % EX OINT
1.0000 "application " | TOPICAL_OINTMENT | Freq: Two times a day (BID) | CUTANEOUS | Status: DC
  Administered 2012-11-23 – 2012-11-25 (×4): 1 via NASAL
  Filled 2012-11-23 (×2): qty 22

## 2012-11-23 MED ORDER — LOSARTAN POTASSIUM 50 MG PO TABS
50.0000 mg | ORAL_TABLET | Freq: Every day | ORAL | Status: DC
  Administered 2012-11-24: 50 mg via ORAL
  Filled 2012-11-23: qty 1

## 2012-11-23 MED ORDER — HYDRALAZINE HCL 20 MG/ML IJ SOLN: 10.0000 mg | Freq: Four times a day (QID) | INTRAMUSCULAR | Status: DC | PRN

## 2012-11-23 MED ORDER — CHLORHEXIDINE GLUCONATE CLOTH 2 % EX PADS
6.0000 | MEDICATED_PAD | Freq: Every day | CUTANEOUS | Status: DC
  Administered 2012-11-24 – 2012-11-25 (×2): 6 via TOPICAL

## 2012-11-23 MED ORDER — MORPHINE SULFATE 2 MG/ML IJ SOLN
INTRAMUSCULAR | Status: AC
  Administered 2012-11-23: 4 mg via INTRAMUSCULAR
  Filled 2012-11-23: qty 1

## 2012-11-23 MED ORDER — MORPHINE SULFATE 4 MG/ML IJ SOLN
4.0000 mg | Freq: Once | INTRAMUSCULAR | Status: AC
  Administered 2012-11-23: 4 mg via INTRAVENOUS
  Filled 2012-11-23: qty 1

## 2012-11-23 MED ORDER — ONDANSETRON HCL 4 MG/2ML IJ SOLN: 4.0000 mg | Freq: Four times a day (QID) | INTRAMUSCULAR | Status: DC | PRN

## 2012-11-23 MED ORDER — PANTOPRAZOLE SODIUM 40 MG PO TBEC
40.0000 mg | DELAYED_RELEASE_TABLET | Freq: Every day | ORAL | Status: DC
  Administered 2012-11-24 – 2012-11-25 (×2): 40 mg via ORAL
  Filled 2012-11-23 (×2): qty 1

## 2012-11-23 MED ORDER — PANTOPRAZOLE SODIUM 40 MG IV SOLR
40.0000 mg | Freq: Every day | INTRAVENOUS | Status: DC
  Administered 2012-11-23: 40 mg via INTRAVENOUS
  Filled 2012-11-23 (×3): qty 40

## 2012-11-23 NOTE — Progress Notes (Signed)
Orthopedic Tech Progress Note Patient Details:  Randall Reeves 06-05-1945 [id number]  Ortho Devices Type of Ortho Device: Ace wrap;Sugartong splint;Arm sling Ortho Device/Splint Location: RUE Ortho Device/Splint Interventions: Ordered;Application   Jennye Moccasin 11/23/2012, 4:56 PM

## 2012-11-23 NOTE — ED Notes (Addendum)
Patient transported to CT/Xray. 

## 2012-11-23 NOTE — ED Notes (Signed)
Ortho tech notified of need for splint 

## 2012-11-23 NOTE — ED Notes (Signed)
Family at beside. Family given emotional support. 

## 2012-11-23 NOTE — Consult Note (Signed)
Reason for Consult:chi Referring Physician: trauma  Randall Reeves is an 67 y.o. male.  HPI: patient who fell from a ladder 25 feet high landing  And hitting his head and chest. Do not remember the accident only waking up in the ambulance. Now he complains of chest pain and headache  Past Medical History  Diagnosis Date  . Allergy   . GERD (gastroesophageal reflux disease)   . Hypertension   . Hyperlipidemia   . Obstructive sleep apnea   . Coronary atherosclerosis of unspecified type of vessel, native or graft 2011    MI but cath benign. Dr Maureen Chatters    Past Surgical History  Procedure Laterality Date  . Spermatocele repair      Family History  Problem Relation Age of Onset  . Stroke Mother   . Hypertension Mother   . Diabetes Father   . Asthma Father   . Heart failure Father   . Hypertension Sister   . Neuropathy Brother   . Hypertension Brother   . Hypertension Brother   . Hypertension Brother   . Hypertension Brother   . Cancer Brother     prostate    Social History:  reports that he has never smoked. He has never used smokeless tobacco. He reports that he drinks alcohol. He reports that he does not use illicit drugs.  Allergies:  Allergies  Allergen Reactions  . Bisoprolol-Hydrochlorothiazide     REACTION: muscle pain, cramps    Medications:see hp  Results for orders placed during the hospital encounter of 11/23/12 (from the past 48 hour(s))  CBC     Status: None   Collection Time    11/23/12  1:50 PM      Result Value Range   WBC 9.6  4.0 - 10.5 K/uL   RBC 4.79  4.22 - 5.81 MIL/uL   Hemoglobin 15.5  13.0 - 17.0 g/dL   HCT 16.1  09.6 - 04.5 %   MCV 91.0  78.0 - 100.0 fL   MCH 32.4  26.0 - 34.0 pg   MCHC 35.6  30.0 - 36.0 g/dL   RDW 40.9  81.1 - 91.4 %   Platelets 208  150 - 400 K/uL  BASIC METABOLIC PANEL     Status: Abnormal   Collection Time    11/23/12  1:50 PM      Result Value Range   Sodium 136  135 - 145 mEq/L   Potassium 4.6  3.5 - 5.1 mEq/L    Chloride 101  96 - 112 mEq/L   CO2 25  19 - 32 mEq/L   Glucose, Bld 101 (*) 70 - 99 mg/dL   BUN 30 (*) 6 - 23 mg/dL   Creatinine, Ser 7.82 (*) 0.50 - 1.35 mg/dL   Calcium 9.2  8.4 - 95.6 mg/dL   GFR calc non Af Amer 51 (*) >90 mL/min   GFR calc Af Amer 59 (*) >90 mL/min   Comment: (NOTE)     The eGFR has been calculated using the CKD EPI equation.     This calculation has not been validated in all clinical situations.     eGFR's persistently <90 mL/min signify possible Chronic Kidney     Disease.  TYPE AND SCREEN     Status: None   Collection Time    11/23/12  1:50 PM      Result Value Range   ABO/RH(D) O POS     Antibody Screen NEG     Sample Expiration  11/26/2012    ABO/RH     Status: None   Collection Time    11/23/12  1:50 PM      Result Value Range   ABO/RH(D) O POS    POCT I-STAT, CHEM 8     Status: Abnormal   Collection Time    11/23/12  2:05 PM      Result Value Range   Sodium 140  135 - 145 mEq/L   Potassium 4.4  3.5 - 5.1 mEq/L   Chloride 105  96 - 112 mEq/L   BUN 31 (*) 6 - 23 mg/dL   Creatinine, Ser 0.86 (*) 0.50 - 1.35 mg/dL   Glucose, Bld 98  70 - 99 mg/dL   Calcium, Ion 5.78  4.69 - 1.30 mmol/L   TCO2 24  0 - 100 mmol/L   Hemoglobin 16.0  13.0 - 17.0 g/dL   HCT 62.9  52.8 - 41.3 %    Dg Wrist Complete Right  11/23/2012   CLINICAL DATA:  Pain post fall  EXAM: RIGHT WRIST - COMPLETE 3+ VIEW  COMPARISON:  None.  FINDINGS: Four views of the right wrist submitted. There is nondisplaced fracture in distal right radius. Fracture line is involving the articular surface of the radius.  IMPRESSION: Nondisplaced fracture in distal right radius.   Electronically Signed   By: Natasha Mead M.D.   On: 11/23/2012 15:44   Ct Head Wo Contrast  11/23/2012   CLINICAL DATA:  Fall, left side headache, left shoulder pain  EXAM: CT HEAD WITHOUT CONTRAST  CT CERVICAL SPINE WITHOUT CONTRAST  TECHNIQUE: Multidetector CT imaging of the head and cervical spine was performed  following the standard protocol without intravenous contrast. Multiplanar CT image reconstructions of the cervical spine were also generated.  COMPARISON:  None.  FINDINGS: CT HEAD FINDINGS  No skull fracture is noted. Paranasal sinuses and mastoid air cells are unremarkable.  No mass effect or midline shift. There is a single focus of parenchymal petechial hemorrhage in right parietal lobe posteriorly axial image 24 measures about 8 mm. No intraventricular hemorrhage. Mild periventricular white matter decreased attenuation probable due to chronic small vessel ischemic changes. No acute cortical infarction.  CT CERVICAL SPINE FINDINGS  Axial images of the cervical spine shows no acute fracture or subluxation. Mild degenerative changes C1-C2 articulation. There is disc space flattening with mild anterior and mild posterior spurring at C6-C7 level. Mild disc space flattening with mild anterior and mild posterior spurring at C7-T1 level. No prevertebral soft tissue swelling. There is no pneumothorax in visualized lung apices.  IMPRESSION: 1. There is a single small focus of petechial parenchymal hemorrhage in right parietal lobe posteriorly axial image 24 measures about 8 mm. No intraventricular hemorrhage. No mass effect or midline shift. Mild periventricular white matter decreased attenuation probable due to chronic small vessel ischemic changes. 2. No cervical spine acute fracture or subluxation. Degenerative changes C6-C7 and C7-T1 level. No prevertebral soft tissue swelling. Critical Value/emergent results were called by telephone at the time of interpretation on 11/23/2012 at 3:39 PM to [facility] , who verbally acknowledged these results.   Electronically Signed   By: Natasha Mead M.D.   On: 11/23/2012 15:39   Ct Chest W Contrast  11/23/2012   CLINICAL DATA:  Fall. Coronary artery atherosclerosis. Hypertension. Left-sided shoulder pain.  EXAM: CT CHEST WITH CONTRAST  TECHNIQUE: Multidetector CT imaging  of the chest was performed during intravenous contrast administration.  CONTRAST:  80mL OMNIPAQUE IOHEXOL 300 MG/ML  SOLN  COMPARISON:  Plain films of earlier today.  FINDINGS: Lungs/Pleura: Minimal linear atelectasis or scarring in the right upper lobe on image 19/ series 3. 3 mm probable subpleural lymph node along the right minor fissure on image 28/series 3. Dependent subsegmental atelectasis within both lung bases. No pneumothorax. Minimal left-sided pleural thickening adjacent to fractures, likely due to trace hemothorax.  Heart/Mediastinum: Aortic atherosclerosis. Normal heart size, without pericardial effusion. Normal appearance of the thoracic aorta, without evidence of mediastinal hematoma.  No mediastinal or hilar adenopathy.  Upper Abdomen: Degradation secondary to overlying wire air and lead artifact. Suspect a splenic artery aneurysm of 8 mm on image 53/ series 2. No upper abdominal free intraperitoneal air.  Bones/Musculoskeletal: Multiple left-sided rib fractures laterally. Minimally displaced 4th anterior. Displaced 5th and 6th lateral. Mildly comminuted lateral 7th rib fracture. Incompletely imaged minimally displaced left 8th rib fracture. Overlying soft tissue hematoma.  IMPRESSION: 1. Multiple left-sided rib fractures with overlying soft tissue swelling and minimal adjacent left hemothorax. 2. No other posttraumatic deformity identified. 3. Suspicion of an 8 mm splenic artery aneurysm.   Electronically Signed   By: Jeronimo Greaves M.D.   On: 11/23/2012 15:48   Ct Cervical Spine Wo Contrast  11/23/2012   CLINICAL DATA:  Fall, left side headache, left shoulder pain  EXAM: CT HEAD WITHOUT CONTRAST  CT CERVICAL SPINE WITHOUT CONTRAST  TECHNIQUE: Multidetector CT imaging of the head and cervical spine was performed following the standard protocol without intravenous contrast. Multiplanar CT image reconstructions of the cervical spine were also generated.  COMPARISON:  None.  FINDINGS: CT HEAD  FINDINGS  No skull fracture is noted. Paranasal sinuses and mastoid air cells are unremarkable.  No mass effect or midline shift. There is a single focus of parenchymal petechial hemorrhage in right parietal lobe posteriorly axial image 24 measures about 8 mm. No intraventricular hemorrhage. Mild periventricular white matter decreased attenuation probable due to chronic small vessel ischemic changes. No acute cortical infarction.  CT CERVICAL SPINE FINDINGS  Axial images of the cervical spine shows no acute fracture or subluxation. Mild degenerative changes C1-C2 articulation. There is disc space flattening with mild anterior and mild posterior spurring at C6-C7 level. Mild disc space flattening with mild anterior and mild posterior spurring at C7-T1 level. No prevertebral soft tissue swelling. There is no pneumothorax in visualized lung apices.  IMPRESSION: 1. There is a single small focus of petechial parenchymal hemorrhage in right parietal lobe posteriorly axial image 24 measures about 8 mm. No intraventricular hemorrhage. No mass effect or midline shift. Mild periventricular white matter decreased attenuation probable due to chronic small vessel ischemic changes. 2. No cervical spine acute fracture or subluxation. Degenerative changes C6-C7 and C7-T1 level. No prevertebral soft tissue swelling. Critical Value/emergent results were called by telephone at the time of interpretation on 11/23/2012 at 3:39 PM to [facility] , who verbally acknowledged these results.   Electronically Signed   By: Natasha Mead M.D.   On: 11/23/2012 15:39   Dg Pelvis Portable  11/23/2012   CLINICAL DATA:  Larey Seat.  Pelvic pain.  EXAM: PORTABLE PELVIS  COMPARISON:  None.  FINDINGS: The hips are normally located. No acute hip fracture. The pubic symphysis and SI joints are intact. No definite pelvic fractures.  IMPRESSION: No acute bony findings.   Electronically Signed   By: Loralie Champagne M.D.   On: 11/23/2012 14:11   Dg Chest  Portable 1 View  11/23/2012   CLINICAL DATA:  History of  trauma from a fall.  EXAM: PORTABLE CHEST - 1 VIEW  COMPARISON:  CHEST x-ray 10/17/2009.  FINDINGS: Lung volumes are slightly low. No consolidative airspace disease. No pleural effusions. No pneumothorax. No pulmonary nodule or mass noted. Pulmonary vasculature and the cardiomediastinal silhouette are within normal limits. Atherosclerosis in the thoracic aorta. Multiple acute left-sided rib fractures involving the lateral aspects of at least the left 5th through 7th ribs are noted.  IMPRESSION: 1. Multiple acute displaced left-sided rib fractures involving at least ribs left 5-7. No definite associated pneumothorax at this time. 2. Atherosclerosis.   Electronically Signed   By: Trudie Reed M.D.   On: 11/23/2012 14:11    Review of Systems  Constitutional: Negative.   Eyes: Negative.   Respiratory: Negative.   Cardiovascular: Positive for chest pain.  Gastrointestinal: Negative.   Genitourinary: Negative.   Musculoskeletal: Negative.   Skin: Negative.   Neurological: Positive for headaches.  Endo/Heme/Allergies: Negative.   Psychiatric/Behavioral: Negative.    Blood pressure 141/87, pulse 107, temperature 97.8 F (36.6 C), resp. rate 14, height 5\' 10"  (1.778 m), weight 87.091 kg (192 lb), SpO2 96.00%. Physical Exam hent, no evidence of trauma. No blood or csf in ears or nose. Neck, some mild tenderness. Cv, nl. Lungs clear but some pain at palpation. Abdomen, soft, extremities, nl. NEURO  Oriented x 2. Do not recall the accident. No weakness. Cn, wnl. Sensory, nl. Ct head smallright paritel bleed. c spine shows ddd in lower area, no fractures.  Assessment/Plan: tba for observation. Ct in 24 hours  Elliona Doddridge M 11/23/2012, 4:34 PM

## 2012-11-23 NOTE — ED Notes (Signed)
Pt fell approx 13 feet while trimming trees , positive loc pt c/o upper back pain  Chest pain and right wrist pain ,

## 2012-11-23 NOTE — ED Notes (Signed)
Report received, assumed care.  

## 2012-11-23 NOTE — ED Notes (Signed)
Report called to RN, othro tech at bed side , will transport when othro is done

## 2012-11-23 NOTE — H&P (Signed)
Randall Reeves is an 67 y.o. male.   Chief Complaint: History of Fall HPI:  Randall Reeves is a 67 year old male with history of 20-15 foot fall from expandable ladder while cutting limbs off trees. Patient had loss of consciousness after the event and awoke on the way to the ED, complaining of left chest pain, back pain, wrist pain and severe headache. Wife states he fell flat on his back and a tree limb fell on his chest. Patient his a past medical history of hypertension and MI (3 years ago). His surgical history includes a hernia repair and vasectomy. 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   Past Medical History  Diagnosis Date  . Allergy   . GERD (gastroesophageal reflux disease)   . Hypertension   . Hyperlipidemia   . Obstructive sleep apnea   . Coronary atherosclerosis of unspecified type of vessel, native or graft 2011    MI but cath benign. Dr Maureen Chatters    Past Surgical History  Procedure Laterality Date  . Spermatocele repair      Family History  Problem Relation Age of Onset  . Stroke Mother   . Hypertension Mother   . Diabetes Father   . Asthma Father   . Heart failure Father   . Hypertension Sister   . Neuropathy Brother   . Hypertension Brother   . Hypertension Brother   . Hypertension Brother   . Hypertension Brother   . Cancer Brother     prostate   Social History:  reports that he has never smoked. He has never used smokeless tobacco. He reports that he drinks alcohol. He reports that he does not use illicit drugs.  Allergies:  Allergies  Allergen Reactions  . Bisoprolol-Hydrochlorothiazide     REACTION: muscle pain, cramps     (Not in a hospital admission)  Results for orders placed during the hospital encounter of 11/23/12 (from the past 48 hour(s))  CBC     Status: None   Collection Time    11/23/12  1:50 PM      Result Value Range   WBC 9.6  4.0 - 10.5 K/uL   RBC 4.79  4.22 -  5.81 MIL/uL   Hemoglobin 15.5  13.0 - 17.0 g/dL   HCT 16.1  09.6 - 04.5 %   MCV 91.0  78.0 - 100.0 fL   MCH 32.4  26.0 - 34.0 pg   MCHC 35.6  30.0 - 36.0 g/dL   RDW 40.9  81.1 - 91.4 %   Platelets 208  150 - 400 K/uL  BASIC METABOLIC PANEL     Status: Abnormal   Collection Time    11/23/12  1:50 PM      Result Value Range   Sodium 136  135 - 145 mEq/L   Potassium 4.6  3.5 - 5.1 mEq/L   Chloride 101  96 - 112 mEq/L   CO2 25  19 - 32 mEq/L   Glucose, Bld 101 (*) 70 - 99 mg/dL   BUN 30 (*) 6 - 23 mg/dL   Creatinine, Ser 7.82 (*) 0.50 - 1.35 mg/dL   Calcium 9.2  8.4 - 95.6 mg/dL   GFR calc non Af Amer 51 (*) >90 mL/min   GFR calc Af Amer 59 (*) >90 mL/min   Comment: (NOTE)     The eGFR has been calculated using the CKD EPI equation.  This calculation has not been validated in all clinical situations.     eGFR's persistently <90 mL/min signify possible Chronic Kidney     Disease.  TYPE AND SCREEN     Status: None   Collection Time    11/23/12  1:50 PM      Result Value Range   ABO/RH(D) O POS     Antibody Screen NEG     Sample Expiration 11/26/2012    ABO/RH     Status: None   Collection Time    11/23/12  1:50 PM      Result Value Range   ABO/RH(D) O POS    POCT I-STAT, CHEM 8     Status: Abnormal   Collection Time    11/23/12  2:05 PM      Result Value Range   Sodium 140  135 - 145 mEq/L   Potassium 4.4  3.5 - 5.1 mEq/L   Chloride 105  96 - 112 mEq/L   BUN 31 (*) 6 - 23 mg/dL   Creatinine, Ser 1.61 (*) 0.50 - 1.35 mg/dL   Glucose, Bld 98  70 - 99 mg/dL   Calcium, Ion 0.96  0.45 - 1.30 mmol/L   TCO2 24  0 - 100 mmol/L   Hemoglobin 16.0  13.0 - 17.0 g/dL   HCT 40.9  81.1 - 91.4 %   Dg Wrist Complete Right  11/23/2012   CLINICAL DATA:  Pain post fall  EXAM: RIGHT WRIST - COMPLETE 3+ VIEW  COMPARISON:  None.  FINDINGS: Four views of the right wrist submitted. There is nondisplaced fracture in distal right radius. Fracture line is involving the articular surface of  the radius.  IMPRESSION: Nondisplaced fracture in distal right radius.   Electronically Signed   By: Natasha Mead M.D.   On: 11/23/2012 15:44   Ct Head Wo Contrast  11/23/2012   CLINICAL DATA:  Fall, left side headache, left shoulder pain  EXAM: CT HEAD WITHOUT CONTRAST  CT CERVICAL SPINE WITHOUT CONTRAST  TECHNIQUE: Multidetector CT imaging of the head and cervical spine was performed following the standard protocol without intravenous contrast. Multiplanar CT image reconstructions of the cervical spine were also generated.  COMPARISON:  None.  FINDINGS: CT HEAD FINDINGS  No skull fracture is noted. Paranasal sinuses and mastoid air cells are unremarkable.  No mass effect or midline shift. There is a single focus of parenchymal petechial hemorrhage in right parietal lobe posteriorly axial image 24 measures about 8 mm. No intraventricular hemorrhage. Mild periventricular white matter decreased attenuation probable due to chronic small vessel ischemic changes. No acute cortical infarction.  CT CERVICAL SPINE FINDINGS  Axial images of the cervical spine shows no acute fracture or subluxation. Mild degenerative changes C1-C2 articulation. There is disc space flattening with mild anterior and mild posterior spurring at C6-C7 level. Mild disc space flattening with mild anterior and mild posterior spurring at C7-T1 level. No prevertebral soft tissue swelling. There is no pneumothorax in visualized lung apices.  IMPRESSION: 1. There is a single small focus of petechial parenchymal hemorrhage in right parietal lobe posteriorly axial image 24 measures about 8 mm. No intraventricular hemorrhage. No mass effect or midline shift. Mild periventricular white matter decreased attenuation probable due to chronic small vessel ischemic changes. 2. No cervical spine acute fracture or subluxation. Degenerative changes C6-C7 and C7-T1 level. No prevertebral soft tissue swelling. Critical Value/emergent results were called by telephone  at the time of interpretation on 11/23/2012 at 3:39 PM to  Dr.SAM Ethelda Chick , who verbally acknowledged these results.   Electronically Signed   By: Natasha Mead M.D.   On: 11/23/2012 15:39   Ct Chest W Contrast  11/23/2012   CLINICAL DATA:  Fall. Coronary artery atherosclerosis. Hypertension. Left-sided shoulder pain.  EXAM: CT CHEST WITH CONTRAST  TECHNIQUE: Multidetector CT imaging of the chest was performed during intravenous contrast administration.  CONTRAST:  80mL OMNIPAQUE IOHEXOL 300 MG/ML  SOLN  COMPARISON:  Plain films of earlier today.  FINDINGS: Lungs/Pleura: Minimal linear atelectasis or scarring in the right upper lobe on image 19/ series 3. 3 mm probable subpleural lymph node along the right minor fissure on image 28/series 3. Dependent subsegmental atelectasis within both lung bases. No pneumothorax. Minimal left-sided pleural thickening adjacent to fractures, likely due to trace hemothorax.  Heart/Mediastinum: Aortic atherosclerosis. Normal heart size, without pericardial effusion. Normal appearance of the thoracic aorta, without evidence of mediastinal hematoma.  No mediastinal or hilar adenopathy.  Upper Abdomen: Degradation secondary to overlying wire air and lead artifact. Suspect a splenic artery aneurysm of 8 mm on image 53/ series 2. No upper abdominal free intraperitoneal air.  Bones/Musculoskeletal: Multiple left-sided rib fractures laterally. Minimally displaced 4th anterior. Displaced 5th and 6th lateral. Mildly comminuted lateral 7th rib fracture. Incompletely imaged minimally displaced left 8th rib fracture. Overlying soft tissue hematoma.  IMPRESSION: 1. Multiple left-sided rib fractures with overlying soft tissue swelling and minimal adjacent left hemothorax. 2. No other posttraumatic deformity identified. 3. Suspicion of an 8 mm splenic artery aneurysm.   Electronically Signed   By: Jeronimo Greaves M.D.   On: 11/23/2012 15:48   Ct Cervical Spine Wo Contrast  11/23/2012   CLINICAL  DATA:  Fall, left side headache, left shoulder pain  EXAM: CT HEAD WITHOUT CONTRAST  CT CERVICAL SPINE WITHOUT CONTRAST  TECHNIQUE: Multidetector CT imaging of the head and cervical spine was performed following the standard protocol without intravenous contrast. Multiplanar CT image reconstructions of the cervical spine were also generated.  COMPARISON:  None.  FINDINGS: CT HEAD FINDINGS  No skull fracture is noted. Paranasal sinuses and mastoid air cells are unremarkable.  No mass effect or midline shift. There is a single focus of parenchymal petechial hemorrhage in right parietal lobe posteriorly axial image 24 measures about 8 mm. No intraventricular hemorrhage. Mild periventricular white matter decreased attenuation probable due to chronic small vessel ischemic changes. No acute cortical infarction.  CT CERVICAL SPINE FINDINGS  Axial images of the cervical spine shows no acute fracture or subluxation. Mild degenerative changes C1-C2 articulation. There is disc space flattening with mild anterior and mild posterior spurring at C6-C7 level. Mild disc space flattening with mild anterior and mild posterior spurring at C7-T1 level. No prevertebral soft tissue swelling. There is no pneumothorax in visualized lung apices.  IMPRESSION: 1. There is a single small focus of petechial parenchymal hemorrhage in right parietal lobe posteriorly axial image 24 measures about 8 mm. No intraventricular hemorrhage. No mass effect or midline shift. Mild periventricular white matter decreased attenuation probable due to chronic small vessel ischemic changes. 2. No cervical spine acute fracture or subluxation. Degenerative changes C6-C7 and C7-T1 level. No prevertebral soft tissue swelling. Critical Value/emergent results were called by telephone at the time of interpretation on 11/23/2012 at 3:39 PM to [facility] , who verbally acknowledged these results.   Electronically Signed   By: Natasha Mead M.D.   On: 11/23/2012 15:39    Dg Pelvis Portable  11/23/2012   CLINICAL DATA:  Fell.  Pelvic pain.  EXAM: PORTABLE PELVIS  COMPARISON:  None.  FINDINGS: The hips are normally located. No acute hip fracture. The pubic symphysis and SI joints are intact. No definite pelvic fractures.  IMPRESSION: No acute bony findings.   Electronically Signed   By: Loralie Champagne M.D.   On: 11/23/2012 14:11   Dg Chest Portable 1 View  11/23/2012   CLINICAL DATA:  History of trauma from a fall.  EXAM: PORTABLE CHEST - 1 VIEW  COMPARISON:  CHEST x-ray 10/17/2009.  FINDINGS: Lung volumes are slightly low. No consolidative airspace disease. No pleural effusions. No pneumothorax. No pulmonary nodule or mass noted. Pulmonary vasculature and the cardiomediastinal silhouette are within normal limits. Atherosclerosis in the thoracic aorta. Multiple acute left-sided rib fractures involving the lateral aspects of at least the left 5th through 7th ribs are noted.  IMPRESSION: 1. Multiple acute displaced left-sided rib fractures involving at least ribs left 5-7. No definite associated pneumothorax at this time. 2. Atherosclerosis.   Electronically Signed   By: Trudie Reed M.D.   On: 11/23/2012 14:11    Review of Systems  Constitutional: Negative.  Negative for fever and diaphoresis.  HENT: Negative for ear discharge, ear pain, hearing loss and nosebleeds.   Eyes: Negative for blurred vision, double vision, photophobia, pain and discharge.  Respiratory: Negative for wheezing.   Cardiovascular: Negative for chest pain and palpitations.  Gastrointestinal: Negative for nausea, vomiting and abdominal pain.  Musculoskeletal: Positive for back pain and falls. Negative for neck pain.  Skin: Negative for itching.  Neurological: Positive for headaches. Negative for dizziness, tremors, sensory change, speech change, focal weakness, seizures, loss of consciousness and weakness.  Psychiatric/Behavioral: Positive for memory loss.    Blood pressure 141/87,  pulse 107, temperature 97.8 F (36.6 C), resp. rate 14, height 5\' 10"  (1.778 m), weight 192 lb (87.091 kg), SpO2 96.00%. Physical Exam  Constitutional: He is oriented to person, place, and time. He appears well-developed and well-nourished. No distress.  HENT:  Head: Normocephalic.  Mouth/Throat: No oropharyngeal exudate.  Eyes: Conjunctivae and EOM are normal. Pupils are equal, round, and reactive to light.  Neck: Normal range of motion.  Cardiovascular: Normal rate, regular rhythm, normal heart sounds and intact distal pulses.   Respiratory: Breath sounds normal. No respiratory distress. He has no rales. He exhibits tenderness.  GI: Soft. There is no tenderness.  Musculoskeletal: He exhibits tenderness.  Right wrist tenderness, limited ROM, good pulses  Neurological: He is alert and oriented to person, place, and time. He has normal reflexes. No cranial nerve deficit. He exhibits normal muscle tone. Coordination normal.  Skin: Skin is warm and dry. He is not diaphoretic.  Psychiatric: He has a normal mood and affect. His behavior is normal. Judgment and thought content normal.     Assessment/Plan 1. Intracranial bleed -  Admit to ICU. CT scan in a.m. to monitor bleed. 2. Rib Fractures - Pulmonary toilet/pain medications 3. Wrist fracture - Refer to ortho for eval 4. Diet/Nutrition - NPO/IV fluids   Signed Britt Bottom, PA-S   Dr. Jeral Fruit from Neurosurgery has seen the patient. Plan F/U CT Head in the AM. TRH to see for syncope W/U. Dr. Orlan Leavens to evaluate distal radius FX. Admit to trauma/neuro ICU. Patient examined and I agree with the assessment and plan  Violeta Gelinas, MD, MPH, FACS Pager: [phone]  11/23/2012 6:00 PM

## 2012-11-23 NOTE — ED Provider Notes (Signed)
CSN: [id number]     Arrival date & time 11/23/12  1323 History   None level V caveat acute condition, TRAUMA ALERT  Chief complaint chest pain, wrist pain after fall No chief complaint on file.  seen on arrival (Consider location/radiation/quality/duration/timing/severity/associated sxs/prior Treatment) HPI Patient fell while standing on a ladder cutting a tree from a height of approximately 15 feet. He complains of left rib pain since the event. Also complains of right wrist pain. No other complaint. He has no recall of the event. He was treated by EMS with long board hard collar and CID and peripheral IV of normal saline and supplemental oxygen. Pain is worse with movement or palpation not improved by anything moderate to severe present. Past Medical History  Diagnosis Date  . Allergy   . GERD (gastroesophageal reflux disease)   . Hypertension   . Hyperlipidemia   . Obstructive sleep apnea   . Coronary atherosclerosis of unspecified type of vessel, native or graft 2011    MI but cath benign. Dr Maureen Chatters   Past Surgical History  Procedure Laterality Date  . Spermatocele repair     Family History  Problem Relation Age of Onset  . Stroke Mother   . Hypertension Mother   . Diabetes Father   . Asthma Father   . Heart failure Father   . Hypertension Sister   . Neuropathy Brother   . Hypertension Brother   . Hypertension Brother   . Hypertension Brother   . Hypertension Brother   . Cancer Brother     prostate   History  Substance Use Topics  . Smoking status: Never Smoker   . Smokeless tobacco: Never Used  . Alcohol Use: Yes     Comment: a beer a day    Review of Systems  Unable to perform ROS: Acuity of condition  Cardiovascular: Positive for chest pain.  Musculoskeletal: Positive for arthralgias.       Right wrist  pain    Allergies  Bisoprolol-hydrochlorothiazide  Home Medications   Current Outpatient Rx  Name  Route  Sig  Dispense  Refill  . aspirin 81 MG  tablet   Oral   Take 81 mg by mouth daily.           Marland Kitchen atorvastatin (LIPITOR) 40 MG tablet   Oral   Take 20 mg by mouth daily.          . fluticasone (FLONASE) 50 MCG/ACT nasal spray   Nasal   Place 2 sprays into the nose daily as needed.         Marland Kitchen losartan (COZAAR) 50 MG tablet   Oral   Take 50 mg by mouth daily.         . meclizine (ANTIVERT) 25 MG tablet   Oral   Take 25 mg by mouth daily as needed for dizziness.         . Multiple Vitamin (MULTIVITAMIN) tablet   Oral   Take 1 tablet by mouth daily.          Marland Kitchen omeprazole (PRILOSEC) 20 MG capsule   Oral   Take 20 mg by mouth daily.           . tadalafil (CIALIS) 5 MG tablet   Oral   Take 5 mg by mouth daily as needed.          BP 132/78  Pulse 102  Temp(Src) 97.8 F (36.6 C)  Resp 24  SpO2 97% Physical Exam  Nursing note  and vitals reviewed. Constitutional: He is oriented to person, place, and time. He appears well-developed and well-nourished. He appears distressed.  To come score 15 appears uncomfortable  HENT:  Head: Normocephalic and atraumatic.  Eyes: Conjunctivae are normal. Pupils are equal, round, and reactive to light.  Neck: Neck supple. No tracheal deviation present. No thyromegaly present.  Cardiovascular: Normal rate and regular rhythm.   No murmur heard. Pulmonary/Chest: Effort normal and breath sounds normal.  Ecchymotic at left anterolateral chest with coarse point tenderness no crepitance or flail  Abdominal: Soft. Bowel sounds are normal. He exhibits no distension. There is no tenderness.  Genitourinary:  Normal male genitalia no scrotal hematoma or blood at meatus  Musculoskeletal: Normal range of motion. He exhibits no edema and no tenderness.  Entire spine nontender pelvis stable nontender pelvis stable nontender. Right upper extremity tender at rest radial pulse 2+ good capillary refill all extremities a contusion abrasion or tenderness neurovascularly intact   Neurological: He is alert and oriented to person, place, and time. Coordination normal.  Skin: Skin is warm and dry. No rash noted.  Psychiatric: He has a normal mood and affect.    ED Course  Procedures (including critical care time) Labs Review Labs Reviewed - No data to display Imaging Review No results found.  EKG Interpretation     Ventricular Rate:  97 PR Interval:  196 QRS Duration: 82 QT Interval:  340 QTC Calculation: 432 R Axis:   4 Text Interpretation:  Sinus rhythm Borderline T abnormalities, inferior leads No significant change since last tracing           Results for orders placed during the hospital encounter of 11/23/12  CBC      Result Value Range   WBC 9.6  4.0 - 10.5 K/uL   RBC 4.79  4.22 - 5.81 MIL/uL   Hemoglobin 15.5  13.0 - 17.0 g/dL   HCT 04.5  40.9 - 81.1 %   MCV 91.0  78.0 - 100.0 fL   MCH 32.4  26.0 - 34.0 pg   MCHC 35.6  30.0 - 36.0 g/dL   RDW 91.4  78.2 - 95.6 %   Platelets 208  150 - 400 K/uL  BASIC METABOLIC PANEL      Result Value Range   Sodium 136  135 - 145 mEq/L   Potassium 4.6  3.5 - 5.1 mEq/L   Chloride 101  96 - 112 mEq/L   CO2 25  19 - 32 mEq/L   Glucose, Bld 101 (*) 70 - 99 mg/dL   BUN 30 (*) 6 - 23 mg/dL   Creatinine, Ser 2.13 (*) 0.50 - 1.35 mg/dL   Calcium 9.2  8.4 - 08.6 mg/dL   GFR calc non Af Amer 51 (*) >90 mL/min   GFR calc Af Amer 59 (*) >90 mL/min  POCT I-STAT, CHEM 8      Result Value Range   Sodium 140  135 - 145 mEq/L   Potassium 4.4  3.5 - 5.1 mEq/L   Chloride 105  96 - 112 mEq/L   BUN 31 (*) 6 - 23 mg/dL   Creatinine, Ser 5.78 (*) 0.50 - 1.35 mg/dL   Glucose, Bld 98  70 - 99 mg/dL   Calcium, Ion 4.69  6.29 - 1.30 mmol/L   TCO2 24  0 - 100 mmol/L   Hemoglobin 16.0  13.0 - 17.0 g/dL   HCT 52.8  41.3 - 24.4 %  TYPE AND SCREEN  Result Value Range   ABO/RH(D) O POS     Antibody Screen NEG     Sample Expiration 11/26/2012    ABO/RH      Result Value Range   ABO/RH(D) O POS     Dg Wrist  Complete Right  11/23/2012   CLINICAL DATA:  Pain post fall  EXAM: RIGHT WRIST - COMPLETE 3+ VIEW  COMPARISON:  None.  FINDINGS: Four views of the right wrist submitted. There is nondisplaced fracture in distal right radius. Fracture line is involving the articular surface of the radius.  IMPRESSION: Nondisplaced fracture in distal right radius.   Electronically Signed   By: Natasha Mead M.D.   On: 11/23/2012 15:44   Ct Head Wo Contrast  11/23/2012   CLINICAL DATA:  Fall, left side headache, left shoulder pain  EXAM: CT HEAD WITHOUT CONTRAST  CT CERVICAL SPINE WITHOUT CONTRAST  TECHNIQUE: Multidetector CT imaging of the head and cervical spine was performed following the standard protocol without intravenous contrast. Multiplanar CT image reconstructions of the cervical spine were also generated.  COMPARISON:  None.  FINDINGS: CT HEAD FINDINGS  No skull fracture is noted. Paranasal sinuses and mastoid air cells are unremarkable.  No mass effect or midline shift. There is a single focus of parenchymal petechial hemorrhage in right parietal lobe posteriorly axial image 24 measures about 8 mm. No intraventricular hemorrhage. Mild periventricular white matter decreased attenuation probable due to chronic small vessel ischemic changes. No acute cortical infarction.  CT CERVICAL SPINE FINDINGS  Axial images of the cervical spine shows no acute fracture or subluxation. Mild degenerative changes C1-C2 articulation. There is disc space flattening with mild anterior and mild posterior spurring at C6-C7 level. Mild disc space flattening with mild anterior and mild posterior spurring at C7-T1 level. No prevertebral soft tissue swelling. There is no pneumothorax in visualized lung apices.  IMPRESSION: 1. There is a single small focus of petechial parenchymal hemorrhage in right parietal lobe posteriorly axial image 24 measures about 8 mm. No intraventricular hemorrhage. No mass effect or midline shift. Mild periventricular  white matter decreased attenuation probable due to chronic small vessel ischemic changes. 2. No cervical spine acute fracture or subluxation. Degenerative changes C6-C7 and C7-T1 level. No prevertebral soft tissue swelling. Critical Value/emergent results were called by telephone at the time of interpretation on 11/23/2012 at 3:39 PM to [facility] , who verbally acknowledged these results.   Electronically Signed   By: Natasha Mead M.D.   On: 11/23/2012 15:39   Ct Chest W Contrast  11/23/2012   CLINICAL DATA:  Fall. Coronary artery atherosclerosis. Hypertension. Left-sided shoulder pain.  EXAM: CT CHEST WITH CONTRAST  TECHNIQUE: Multidetector CT imaging of the chest was performed during intravenous contrast administration.  CONTRAST:  80mL OMNIPAQUE IOHEXOL 300 MG/ML  SOLN  COMPARISON:  Plain films of earlier today.  FINDINGS: Lungs/Pleura: Minimal linear atelectasis or scarring in the right upper lobe on image 19/ series 3. 3 mm probable subpleural lymph node along the right minor fissure on image 28/series 3. Dependent subsegmental atelectasis within both lung bases. No pneumothorax. Minimal left-sided pleural thickening adjacent to fractures, likely due to trace hemothorax.  Heart/Mediastinum: Aortic atherosclerosis. Normal heart size, without pericardial effusion. Normal appearance of the thoracic aorta, without evidence of mediastinal hematoma.  No mediastinal or hilar adenopathy.  Upper Abdomen: Degradation secondary to overlying wire air and lead artifact. Suspect a splenic artery aneurysm of 8 mm on image 53/ series 2. No upper abdominal free  intraperitoneal air.  Bones/Musculoskeletal: Multiple left-sided rib fractures laterally. Minimally displaced 4th anterior. Displaced 5th and 6th lateral. Mildly comminuted lateral 7th rib fracture. Incompletely imaged minimally displaced left 8th rib fracture. Overlying soft tissue hematoma.  IMPRESSION: 1. Multiple left-sided rib fractures with overlying soft  tissue swelling and minimal adjacent left hemothorax. 2. No other posttraumatic deformity identified. 3. Suspicion of an 8 mm splenic artery aneurysm.   Electronically Signed   By: Jeronimo Greaves M.D.   On: 11/23/2012 15:48   Ct Cervical Spine Wo Contrast  11/23/2012   CLINICAL DATA:  Fall, left side headache, left shoulder pain  EXAM: CT HEAD WITHOUT CONTRAST  CT CERVICAL SPINE WITHOUT CONTRAST  TECHNIQUE: Multidetector CT imaging of the head and cervical spine was performed following the standard protocol without intravenous contrast. Multiplanar CT image reconstructions of the cervical spine were also generated.  COMPARISON:  None.  FINDINGS: CT HEAD FINDINGS  No skull fracture is noted. Paranasal sinuses and mastoid air cells are unremarkable.  No mass effect or midline shift. There is a single focus of parenchymal petechial hemorrhage in right parietal lobe posteriorly axial image 24 measures about 8 mm. No intraventricular hemorrhage. Mild periventricular white matter decreased attenuation probable due to chronic small vessel ischemic changes. No acute cortical infarction.  CT CERVICAL SPINE FINDINGS  Axial images of the cervical spine shows no acute fracture or subluxation. Mild degenerative changes C1-C2 articulation. There is disc space flattening with mild anterior and mild posterior spurring at C6-C7 level. Mild disc space flattening with mild anterior and mild posterior spurring at C7-T1 level. No prevertebral soft tissue swelling. There is no pneumothorax in visualized lung apices.  IMPRESSION: 1. There is a single small focus of petechial parenchymal hemorrhage in right parietal lobe posteriorly axial image 24 measures about 8 mm. No intraventricular hemorrhage. No mass effect or midline shift. Mild periventricular white matter decreased attenuation probable due to chronic small vessel ischemic changes. 2. No cervical spine acute fracture or subluxation. Degenerative changes C6-C7 and C7-T1 level. No  prevertebral soft tissue swelling. Critical Value/emergent results were called by telephone at the time of interpretation on 11/23/2012 at 3:39 PM to [facility] , who verbally acknowledged these results.   Electronically Signed   By: Natasha Mead M.D.   On: 11/23/2012 15:39   Dg Pelvis Portable  11/23/2012   CLINICAL DATA:  Larey Seat.  Pelvic pain.  EXAM: PORTABLE PELVIS  COMPARISON:  None.  FINDINGS: The hips are normally located. No acute hip fracture. The pubic symphysis and SI joints are intact. No definite pelvic fractures.  IMPRESSION: No acute bony findings.   Electronically Signed   By: Loralie Champagne M.D.   On: 11/23/2012 14:11   Dg Chest Portable 1 View  11/23/2012   CLINICAL DATA:  History of trauma from a fall.  EXAM: PORTABLE CHEST - 1 VIEW  COMPARISON:  CHEST x-ray 10/17/2009.  FINDINGS: Lung volumes are slightly low. No consolidative airspace disease. No pleural effusions. No pneumothorax. No pulmonary nodule or mass noted. Pulmonary vasculature and the cardiomediastinal silhouette are within normal limits. Atherosclerosis in the thoracic aorta. Multiple acute left-sided rib fractures involving the lateral aspects of at least the left 5th through 7th ribs are noted.  IMPRESSION: 1. Multiple acute displaced left-sided rib fractures involving at least ribs left 5-7. No definite associated pneumothorax at this time. 2. Atherosclerosis.   Electronically Signed   By: Trudie Reed M.D.   On: 11/23/2012 14:11    2:15 PM  requesting more pain medicine after treatment with intravenous morphine. Additional morphine ordered. Patient remains awake alert Glasgow Coma Score 15. 4 PM patient is resting comfortably Glasgow Coma Score 15 states his pain is under control.  MDM  No diagnosis found. Cervical spine felt to be clear after ct scan and non focal exam I consult Dr. Janee Morn from trauma service who will arrange for admission. Also spoke with Dr. Melvyn Novas, hand surgeon suggests sugar tong  splint and he will evaluate patient while in hospital. CT scan of brain discussed with radiologist. No acute intervention needed the patient remains alert and Glasgow Coma Score 15 presently Diagnosis #1 multiple left rib fractures #2 closed right radius fracture #3 traumatic intracranial bleed #4 fall #5 renal insufficiency CRITICAL CARE Performed by: Doug Sou Total critical care time: 40 minute Critical care time was exclusive of separately billable procedures and treating other patients. Critical care was necessary to treat or prevent imminent or life-threatening deterioration. Critical care was time spent personally by me on the following activities: development of treatment plan with patient and/or surrogate as well as nursing, discussions with consultants, evaluation of patient's response to treatment, examination of patient, obtaining history from patient or surrogate, ordering and performing treatments and interventions, ordering and review of laboratory studies, ordering and review of radiographic studies, pulse oximetry and re-evaluation of patient's condition.  Doug Sou, MD 11/23/12 [phone]

## 2012-11-23 NOTE — Consult Note (Signed)
Reason for Consult:RIGHT DISTAL RADIUS FRACTURE Referring Physician: DR. Lavone Nian Mazzeo is an 67 y.o. male.  HPI: HISTORY WELL DOCUMENTED IN CHART AND NOTES REVIEWED PT FELL FROM LADDER SUSTAINING MULTIPLE INJURIES I WAS CONSULTED FOR EVALUATION OF RIGHT, DOMINANT WRIST INJURY NO PRIOR INJURY TO RIGHT WRIST  Past Medical History  Diagnosis Date  . Allergy   . GERD (gastroesophageal reflux disease)   . Hypertension   . Hyperlipidemia   . Obstructive sleep apnea   . Coronary atherosclerosis of unspecified type of vessel, native or graft 2011    MI but cath benign. Dr Maureen Chatters    Past Surgical History  Procedure Laterality Date  . Spermatocele repair      Family History  Problem Relation Age of Onset  . Stroke Mother   . Hypertension Mother   . Diabetes Father   . Asthma Father   . Heart failure Father   . Hypertension Sister   . Neuropathy Brother   . Hypertension Brother   . Hypertension Brother   . Hypertension Brother   . Hypertension Brother   . Cancer Brother     prostate    Social History:  reports that he has never smoked. He has never used smokeless tobacco. He reports that he drinks alcohol. He reports that he does not use illicit drugs.  Allergies:  Allergies  Allergen Reactions  . Bisoprolol-Hydrochlorothiazide     REACTION: muscle pain, cramps  . Codeine Nausea Only    Medications: I have reviewed the patient's current medications.  Results for orders placed during the hospital encounter of 11/23/12 (from the past 48 hour(s))  CBC     Status: None   Collection Time    11/23/12  1:50 PM      Result Value Range   WBC 9.6  4.0 - 10.5 K/uL   RBC 4.79  4.22 - 5.81 MIL/uL   Hemoglobin 15.5  13.0 - 17.0 g/dL   HCT 16.1  09.6 - 04.5 %   MCV 91.0  78.0 - 100.0 fL   MCH 32.4  26.0 - 34.0 pg   MCHC 35.6  30.0 - 36.0 g/dL   RDW 40.9  81.1 - 91.4 %   Platelets 208  150 - 400 K/uL  BASIC METABOLIC PANEL     Status: Abnormal   Collection Time     11/23/12  1:50 PM      Result Value Range   Sodium 136  135 - 145 mEq/L   Potassium 4.6  3.5 - 5.1 mEq/L   Chloride 101  96 - 112 mEq/L   CO2 25  19 - 32 mEq/L   Glucose, Bld 101 (*) 70 - 99 mg/dL   BUN 30 (*) 6 - 23 mg/dL   Creatinine, Ser 7.82 (*) 0.50 - 1.35 mg/dL   Calcium 9.2  8.4 - 95.6 mg/dL   GFR calc non Af Amer 51 (*) >90 mL/min   GFR calc Af Amer 59 (*) >90 mL/min   Comment: (NOTE)     The eGFR has been calculated using the CKD EPI equation.     This calculation has not been validated in all clinical situations.     eGFR's persistently <90 mL/min signify possible Chronic Kidney     Disease.  TYPE AND SCREEN     Status: None   Collection Time    11/23/12  1:50 PM      Result Value Range   ABO/RH(D) O POS  Antibody Screen NEG     Sample Expiration 11/26/2012    ABO/RH     Status: None   Collection Time    11/23/12  1:50 PM      Result Value Range   ABO/RH(D) O POS    POCT I-STAT, CHEM 8     Status: Abnormal   Collection Time    11/23/12  2:05 PM      Result Value Range   Sodium 140  135 - 145 mEq/L   Potassium 4.4  3.5 - 5.1 mEq/L   Chloride 105  96 - 112 mEq/L   BUN 31 (*) 6 - 23 mg/dL   Creatinine, Ser 0.45 (*) 0.50 - 1.35 mg/dL   Glucose, Bld 98  70 - 99 mg/dL   Calcium, Ion 4.09  8.11 - 1.30 mmol/L   TCO2 24  0 - 100 mmol/L   Hemoglobin 16.0  13.0 - 17.0 g/dL   HCT 91.4  78.2 - 95.6 %  MRSA PCR SCREENING     Status: Abnormal   Collection Time    11/23/12  5:34 PM      Result Value Range   MRSA by PCR POSITIVE (*) NEGATIVE   Comment:            The GeneXpert MRSA Assay (FDA     approved for NASAL specimens     only), is one component of a     comprehensive MRSA colonization     surveillance program. It is not     intended to diagnose MRSA     infection nor to guide or     monitor treatment for     MRSA infections.     RESULT CALLED TO, READ BACK BY AND VERIFIED WITHFestus Holts RN 2130 11/23/12 A BROWNING  TROPONIN I     Status: None    Collection Time    11/23/12  6:36 PM      Result Value Range   Troponin I <0.30  <0.30 ng/mL   Comment:            Due to the release kinetics of cTnI,     a negative result within the first hours     of the onset of symptoms does not rule out     myocardial infarction with certainty.     If myocardial infarction is still suspected,     repeat the test at appropriate intervals.    Dg Wrist Complete Right  11/23/2012   CLINICAL DATA:  Pain post fall  EXAM: RIGHT WRIST - COMPLETE 3+ VIEW  COMPARISON:  None.  FINDINGS: Four views of the right wrist submitted. There is nondisplaced fracture in distal right radius. Fracture line is involving the articular surface of the radius.  IMPRESSION: Nondisplaced fracture in distal right radius.   Electronically Signed   By: Natasha Mead M.D.   On: 11/23/2012 15:44   Ct Head Wo Contrast  11/23/2012   CLINICAL DATA:  Fall, left side headache, left shoulder pain  EXAM: CT HEAD WITHOUT CONTRAST  CT CERVICAL SPINE WITHOUT CONTRAST  TECHNIQUE: Multidetector CT imaging of the head and cervical spine was performed following the standard protocol without intravenous contrast. Multiplanar CT image reconstructions of the cervical spine were also generated.  COMPARISON:  None.  FINDINGS: CT HEAD FINDINGS  No skull fracture is noted. Paranasal sinuses and mastoid air cells are unremarkable.  No mass effect or midline shift. There is a single  focus of parenchymal petechial hemorrhage in right parietal lobe posteriorly axial image 24 measures about 8 mm. No intraventricular hemorrhage. Mild periventricular white matter decreased attenuation probable due to chronic small vessel ischemic changes. No acute cortical infarction.  CT CERVICAL SPINE FINDINGS  Axial images of the cervical spine shows no acute fracture or subluxation. Mild degenerative changes C1-C2 articulation. There is disc space flattening with mild anterior and mild posterior spurring at C6-C7 level. Mild disc  space flattening with mild anterior and mild posterior spurring at C7-T1 level. No prevertebral soft tissue swelling. There is no pneumothorax in visualized lung apices.  IMPRESSION: 1. There is a single small focus of petechial parenchymal hemorrhage in right parietal lobe posteriorly axial image 24 measures about 8 mm. No intraventricular hemorrhage. No mass effect or midline shift. Mild periventricular white matter decreased attenuation probable due to chronic small vessel ischemic changes. 2. No cervical spine acute fracture or subluxation. Degenerative changes C6-C7 and C7-T1 level. No prevertebral soft tissue swelling. Critical Value/emergent results were called by telephone at the time of interpretation on 11/23/2012 at 3:39 PM to [facility] , who verbally acknowledged these results.   Electronically Signed   By: Natasha Mead M.D.   On: 11/23/2012 15:39   Ct Chest W Contrast  11/23/2012   CLINICAL DATA:  Fall. Coronary artery atherosclerosis. Hypertension. Left-sided shoulder pain.  EXAM: CT CHEST WITH CONTRAST  TECHNIQUE: Multidetector CT imaging of the chest was performed during intravenous contrast administration.  CONTRAST:  80mL OMNIPAQUE IOHEXOL 300 MG/ML  SOLN  COMPARISON:  Plain films of earlier today.  FINDINGS: Lungs/Pleura: Minimal linear atelectasis or scarring in the right upper lobe on image 19/ series 3. 3 mm probable subpleural lymph node along the right minor fissure on image 28/series 3. Dependent subsegmental atelectasis within both lung bases. No pneumothorax. Minimal left-sided pleural thickening adjacent to fractures, likely due to trace hemothorax.  Heart/Mediastinum: Aortic atherosclerosis. Normal heart size, without pericardial effusion. Normal appearance of the thoracic aorta, without evidence of mediastinal hematoma.  No mediastinal or hilar adenopathy.  Upper Abdomen: Degradation secondary to overlying wire air and lead artifact. Suspect a splenic artery aneurysm of 8 mm  on image 53/ series 2. No upper abdominal free intraperitoneal air.  Bones/Musculoskeletal: Multiple left-sided rib fractures laterally. Minimally displaced 4th anterior. Displaced 5th and 6th lateral. Mildly comminuted lateral 7th rib fracture. Incompletely imaged minimally displaced left 8th rib fracture. Overlying soft tissue hematoma.  IMPRESSION: 1. Multiple left-sided rib fractures with overlying soft tissue swelling and minimal adjacent left hemothorax. 2. No other posttraumatic deformity identified. 3. Suspicion of an 8 mm splenic artery aneurysm.   Electronically Signed   By: Jeronimo Greaves M.D.   On: 11/23/2012 15:48   Ct Cervical Spine Wo Contrast  11/23/2012   CLINICAL DATA:  Fall, left side headache, left shoulder pain  EXAM: CT HEAD WITHOUT CONTRAST  CT CERVICAL SPINE WITHOUT CONTRAST  TECHNIQUE: Multidetector CT imaging of the head and cervical spine was performed following the standard protocol without intravenous contrast. Multiplanar CT image reconstructions of the cervical spine were also generated.  COMPARISON:  None.  FINDINGS: CT HEAD FINDINGS  No skull fracture is noted. Paranasal sinuses and mastoid air cells are unremarkable.  No mass effect or midline shift. There is a single focus of parenchymal petechial hemorrhage in right parietal lobe posteriorly axial image 24 measures about 8 mm. No intraventricular hemorrhage. Mild periventricular white matter decreased attenuation probable due to chronic small vessel ischemic changes. No  acute cortical infarction.  CT CERVICAL SPINE FINDINGS  Axial images of the cervical spine shows no acute fracture or subluxation. Mild degenerative changes C1-C2 articulation. There is disc space flattening with mild anterior and mild posterior spurring at C6-C7 level. Mild disc space flattening with mild anterior and mild posterior spurring at C7-T1 level. No prevertebral soft tissue swelling. There is no pneumothorax in visualized lung apices.  IMPRESSION: 1.  There is a single small focus of petechial parenchymal hemorrhage in right parietal lobe posteriorly axial image 24 measures about 8 mm. No intraventricular hemorrhage. No mass effect or midline shift. Mild periventricular white matter decreased attenuation probable due to chronic small vessel ischemic changes. 2. No cervical spine acute fracture or subluxation. Degenerative changes C6-C7 and C7-T1 level. No prevertebral soft tissue swelling. Critical Value/emergent results were called by telephone at the time of interpretation on 11/23/2012 at 3:39 PM to [facility] , who verbally acknowledged these results.   Electronically Signed   By: Natasha Mead M.D.   On: 11/23/2012 15:39   Dg Pelvis Portable  11/23/2012   CLINICAL DATA:  Larey Seat.  Pelvic pain.  EXAM: PORTABLE PELVIS  COMPARISON:  None.  FINDINGS: The hips are normally located. No acute hip fracture. The pubic symphysis and SI joints are intact. No definite pelvic fractures.  IMPRESSION: No acute bony findings.   Electronically Signed   By: Loralie Champagne M.D.   On: 11/23/2012 14:11   Dg Chest Portable 1 View  11/23/2012   CLINICAL DATA:  History of trauma from a fall.  EXAM: PORTABLE CHEST - 1 VIEW  COMPARISON:  CHEST x-ray 10/17/2009.  FINDINGS: Lung volumes are slightly low. No consolidative airspace disease. No pleural effusions. No pneumothorax. No pulmonary nodule or mass noted. Pulmonary vasculature and the cardiomediastinal silhouette are within normal limits. Atherosclerosis in the thoracic aorta. Multiple acute left-sided rib fractures involving the lateral aspects of at least the left 5th through 7th ribs are noted.  IMPRESSION: 1. Multiple acute displaced left-sided rib fractures involving at least ribs left 5-7. No definite associated pneumothorax at this time. 2. Atherosclerosis.   Electronically Signed   By: Trudie Reed M.D.   On: 11/23/2012 14:11    ROS NO SURGERY OR PRIOR INJURY TO RIGHT WRIST Blood pressure 133/88, pulse  101, temperature 98 F (36.7 C), temperature source Oral, resp. rate 20, height 5\' 10"  (1.778 m), weight 87.091 kg (192 lb), SpO2 96.00%. Physical Exam: AWAKE ALERT FOLLOWS COMMANDS, ORIENTED TO P/P/T JXB:JYNWGN IN PLACE FINGERS WARM WELL PERFUSED ABLE TO EXTEND THUMB AND DIGITS I DID NOT REMOVE SPLINT  Assessment/Plan: RIGHT WRIST MINIMALLY DISPLACED INTRA-ARTICULAR DISTAL RADIUS FRACTURE  WOULD RECOMMEND CONTINUATION OF NON-OP CARE RIGHT NOW ICE/ELEVATION NO WEIGHT ON RIGHT WRIST CONTINUE WITH SUGARTONG SPLINT WILL NEED TO SEE IN OFFICE IN ONE WEEK POST DISCHARGE TO REPEAT HIS FILMS TO ASSESS FOR ANY DISPLACEMENT IF THAT OCCURS WOULD THEN POSSIBLY BE CANDIDATE FOR SURGERY CONTACT ME IF QUESTIONS TEXT OR CALL [phone]  Sharma Covert 11/23/2012, 9:46 PM

## 2012-11-23 NOTE — ED Notes (Signed)
Pt was trimming trees and fell from the ladder hitting the ground , a limb did fall from the tree hitting him in the chest , pt did have positive loc

## 2012-11-23 NOTE — Consult Note (Signed)
[facility] Medical Consultation  Randall Reeves ZDG:[id number] DOB: 05/07/1945 DOA: 11/23/2012 PCP: Tillman Abide, MD   Requesting physician: Dr. Janee Morn Date of consultation: 10.30.2014 Reason for consultation: syncope  Impression/Recommendations  Syncope and collapse: - Unclear of syncopal episode. As per wife she relates that he was tolerating on the floor able to communicate with the son. He relates no prodromal symptoms. It is prudent to admit him to telemetry for monitor for any arrhythmias, check an echocardiogram to rule out aortic stenosis. We'll also consider cycling his cardiac enzymes, as he has had a previous episode of non-ST elevation, I'm sure his CK's will  be elevated. His review not having any chest pain or any shortness of breath. - If he presents with elevated troponins and new EKG changes it would be prudent to call cardiology. But I think at this time is not candidate for PCI due his spleen hematoma.  AKI: - Hold his ARB. Start him on IV fluids check a basic metabolic panel in the morning. - Is most likely due to prerenal azotemia   HYPERTENSION: - Hold Losartan. - Use hydralazine when necessary.   Fall/Multiple fractures of ribs of left side/Wright wrist fracture/ TBI (traumatic brain injury) - CT imaging as below. Further management per trauma team and neurosurgery.  Chief Complaint: syncope  HPI:  67 year old male with past medical history of hypertension, and a history of non-STEMI in 2012 that comes in for a fall from a ladder. He relates he was cutting some days he started to come down there was some loss of consciousness. He had no prodromal symptoms. He relates no swelling, shortness of breath, chest pain, change in vision, double vision, sweating, nausea, vomiting, no episodes of imbalance. Previous episodes of nausea vomiting or diarrhea. No headaches.  Review of Systems:  Constitutional:  No weight loss, night sweats, Fevers, chills, fatigue.   HEENT:  No headaches, Difficulty swallowing,Tooth/dental problems,Sore throat,  No sneezing, itching, ear ache, nasal congestion, post nasal drip,  Cardio-vascular:  No chest pain, Orthopnea, PND, swelling in lower extremities, anasarca, dizziness, palpitations  GI:  No heartburn, indigestion, abdominal pain, nausea, vomiting, diarrhea, change in bowel habits, loss of appetite  Resp:  No shortness of breath with exertion or at rest. No excess mucus, no productive cough, No non-productive cough, No coughing up of blood.No change in color of mucus.No wheezing.No chest wall deformity  Skin:  no rash or lesions.  GU:  no dysuria, change in color of urine, no urgency or frequency. No flank pain.  Musculoskeletal:  No joint pain or swelling. No decreased range of motion. No back pain.  Psych:  No change in mood or affect. No depression or anxiety. No memory loss.    Past Medical History  Diagnosis Date  . Allergy   . GERD (gastroesophageal reflux disease)   . Hypertension   . Hyperlipidemia   . Obstructive sleep apnea   . Coronary atherosclerosis of unspecified type of vessel, native or graft 2011    MI but cath benign. Dr Maureen Chatters   Past Surgical History  Procedure Laterality Date  . Spermatocele repair     Social History:  reports that he has never smoked. He has never used smokeless tobacco. He reports that he drinks alcohol. He reports that he does not use illicit drugs.  Allergies  Allergen Reactions  . Bisoprolol-Hydrochlorothiazide     REACTION: muscle pain, cramps   Family History  Problem Relation Age of Onset  . Stroke Mother   .  Hypertension Mother   . Diabetes Father   . Asthma Father   . Heart failure Father   . Hypertension Sister   . Neuropathy Brother   . Hypertension Brother   . Hypertension Brother   . Hypertension Brother   . Hypertension Brother   . Cancer Brother     prostate    Prior to Admission medications   Medication Sig Start Date End  Date Taking? Authorizing Provider  aspirin 81 MG chewable tablet Chew 81 mg by mouth daily.   Yes Historical Provider, MD  atorvastatin (LIPITOR) 40 MG tablet Take 20 mg by mouth daily.    Yes Historical Provider, MD  losartan (COZAAR) 50 MG tablet Take 50 mg by mouth daily.   Yes Historical Provider, MD  meclizine (ANTIVERT) 25 MG tablet Take 25 mg by mouth daily as needed for dizziness. 06/09/12  Yes [name], MD  Multiple Vitamin (MULTIVITAMIN) tablet Take 1 tablet by mouth daily.    Yes Historical Provider, MD  omeprazole (PRILOSEC) 20 MG capsule Take 20 mg by mouth daily.     Yes Historical Provider, MD  tadalafil (CIALIS) 5 MG tablet Take 5 mg by mouth daily as needed for erectile dysfunction.  06/09/12  Yes [name], MD   Physical Exam: Blood pressure 141/87, pulse 107, temperature 97.8 F (36.6 C), resp. rate 14, height 5\' 10"  (1.778 m), weight 87.091 kg (192 lb), SpO2 96.00%. Filed Vitals:   11/23/12 1615  BP: 141/87  Pulse: 107  Temp:   Resp: 14    BP 141/87  Pulse 107  Temp(Src) 97.8 F (36.6 C)  Resp 14  Ht 5\' 10"  (1.778 m)  Wt 87.091 kg (192 lb)  BMI 27.55 kg/m2  SpO2 96%  General Appearance:    Alert, cooperative, no distress, appears stated age              Throat:   Lips, mucosa, and tongue dry  Neck:   Supple, symmetrical, trachea midline, no adenopathy;       thyroid:  No JVD  Back:     Symmetric, no curvature, ROM normal, no CVA tenderness  Lungs:     Clear to auscultation bilaterally, respirations unlabored, tender to palpation      Heart:    Regular rate and rhythm, S1 and S2 normal, no murmur, rub   or gallop  Abdomen:     Soft, non-tender, bowel sounds active all four quadrants,    no masses, no organomegaly        Extremities:   painful wrist to passive movement.   Pulses:   2+ and symmetric all extremities  Skin:   Skin color, texture, turgor normal, no rashes or lesions  Lymph nodes:   Cervical, supraclavicular, and axillary  nodes normal  Neurologic:   CNII-XII intact. Normal strength, sensation and reflexes      throughout     Labs on Admission:  Basic Metabolic Panel:  Recent Labs Lab 11/23/12 1350 11/23/12 1405  NA 136 140  K 4.6 4.4  CL 101 105  CO2 25  --   GLUCOSE 101* 98  BUN 30* 31*  CREATININE 1.40* 1.70*  CALCIUM 9.2  --    Liver Function Tests: No results found for this basename: AST, ALT, ALKPHOS, BILITOT, PROT, ALBUMIN,  in the last 168 hours No results found for this basename: LIPASE, AMYLASE,  in the last 168 hours No results found for this basename: AMMONIA,  in the last 168  hours CBC:  Recent Labs Lab 11/23/12 1350 11/23/12 1405  WBC 9.6  --   HGB 15.5 16.0  HCT 43.6 47.0  MCV 91.0  --   PLT 208  --    Cardiac Enzymes: No results found for this basename: CKTOTAL, CKMB, CKMBINDEX, TROPONINI,  in the last 168 hours BNP: No components found with this basename: POCBNP,  CBG: No results found for this basename: GLUCAP,  in the last 168 hours  Radiological Exams on Admission: Dg Wrist Complete Right  11/23/2012   CLINICAL DATA:  Pain post fall  EXAM: RIGHT WRIST - COMPLETE 3+ VIEW  COMPARISON:  None.  FINDINGS: Four views of the right wrist submitted. There is nondisplaced fracture in distal right radius. Fracture line is involving the articular surface of the radius.  IMPRESSION: Nondisplaced fracture in distal right radius.   Electronically Signed   By: Natasha Mead M.D.   On: 11/23/2012 15:44   Ct Head Wo Contrast  11/23/2012   CLINICAL DATA:  Fall, left side headache, left shoulder pain  EXAM: CT HEAD WITHOUT CONTRAST  CT CERVICAL SPINE WITHOUT CONTRAST  TECHNIQUE: Multidetector CT imaging of the head and cervical spine was performed following the standard protocol without intravenous contrast. Multiplanar CT image reconstructions of the cervical spine were also generated.  COMPARISON:  None.  FINDINGS: CT HEAD FINDINGS  No skull fracture is noted. Paranasal sinuses and  mastoid air cells are unremarkable.  No mass effect or midline shift. There is a single focus of parenchymal petechial hemorrhage in right parietal lobe posteriorly axial image 24 measures about 8 mm. No intraventricular hemorrhage. Mild periventricular white matter decreased attenuation probable due to chronic small vessel ischemic changes. No acute cortical infarction.  CT CERVICAL SPINE FINDINGS  Axial images of the cervical spine shows no acute fracture or subluxation. Mild degenerative changes C1-C2 articulation. There is disc space flattening with mild anterior and mild posterior spurring at C6-C7 level. Mild disc space flattening with mild anterior and mild posterior spurring at C7-T1 level. No prevertebral soft tissue swelling. There is no pneumothorax in visualized lung apices.  IMPRESSION: 1. There is a single small focus of petechial parenchymal hemorrhage in right parietal lobe posteriorly axial image 24 measures about 8 mm. No intraventricular hemorrhage. No mass effect or midline shift. Mild periventricular white matter decreased attenuation probable due to chronic small vessel ischemic changes. 2. No cervical spine acute fracture or subluxation. Degenerative changes C6-C7 and C7-T1 level. No prevertebral soft tissue swelling. Critical Value/emergent results were called by telephone at the time of interpretation on 11/23/2012 at 3:39 PM to [facility] , who verbally acknowledged these results.   Electronically Signed   By: Natasha Mead M.D.   On: 11/23/2012 15:39   Ct Chest W Contrast  11/23/2012   CLINICAL DATA:  Fall. Coronary artery atherosclerosis. Hypertension. Left-sided shoulder pain.  EXAM: CT CHEST WITH CONTRAST  TECHNIQUE: Multidetector CT imaging of the chest was performed during intravenous contrast administration.  CONTRAST:  80mL OMNIPAQUE IOHEXOL 300 MG/ML  SOLN  COMPARISON:  Plain films of earlier today.  FINDINGS: Lungs/Pleura: Minimal linear atelectasis or scarring in the  right upper lobe on image 19/ series 3. 3 mm probable subpleural lymph node along the right minor fissure on image 28/series 3. Dependent subsegmental atelectasis within both lung bases. No pneumothorax. Minimal left-sided pleural thickening adjacent to fractures, likely due to trace hemothorax.  Heart/Mediastinum: Aortic atherosclerosis. Normal heart size, without pericardial effusion. Normal appearance of the thoracic  aorta, without evidence of mediastinal hematoma.  No mediastinal or hilar adenopathy.  Upper Abdomen: Degradation secondary to overlying wire air and lead artifact. Suspect a splenic artery aneurysm of 8 mm on image 53/ series 2. No upper abdominal free intraperitoneal air.  Bones/Musculoskeletal: Multiple left-sided rib fractures laterally. Minimally displaced 4th anterior. Displaced 5th and 6th lateral. Mildly comminuted lateral 7th rib fracture. Incompletely imaged minimally displaced left 8th rib fracture. Overlying soft tissue hematoma.  IMPRESSION: 1. Multiple left-sided rib fractures with overlying soft tissue swelling and minimal adjacent left hemothorax. 2. No other posttraumatic deformity identified. 3. Suspicion of an 8 mm splenic artery aneurysm.   Electronically Signed   By: Jeronimo Greaves M.D.   On: 11/23/2012 15:48   Ct Cervical Spine Wo Contrast  11/23/2012   CLINICAL DATA:  Fall, left side headache, left shoulder pain  EXAM: CT HEAD WITHOUT CONTRAST  CT CERVICAL SPINE WITHOUT CONTRAST  TECHNIQUE: Multidetector CT imaging of the head and cervical spine was performed following the standard protocol without intravenous contrast. Multiplanar CT image reconstructions of the cervical spine were also generated.  COMPARISON:  None.  FINDINGS: CT HEAD FINDINGS  No skull fracture is noted. Paranasal sinuses and mastoid air cells are unremarkable.  No mass effect or midline shift. There is a single focus of parenchymal petechial hemorrhage in right parietal lobe posteriorly axial image 24  measures about 8 mm. No intraventricular hemorrhage. Mild periventricular white matter decreased attenuation probable due to chronic small vessel ischemic changes. No acute cortical infarction.  CT CERVICAL SPINE FINDINGS  Axial images of the cervical spine shows no acute fracture or subluxation. Mild degenerative changes C1-C2 articulation. There is disc space flattening with mild anterior and mild posterior spurring at C6-C7 level. Mild disc space flattening with mild anterior and mild posterior spurring at C7-T1 level. No prevertebral soft tissue swelling. There is no pneumothorax in visualized lung apices.  IMPRESSION: 1. There is a single small focus of petechial parenchymal hemorrhage in right parietal lobe posteriorly axial image 24 measures about 8 mm. No intraventricular hemorrhage. No mass effect or midline shift. Mild periventricular white matter decreased attenuation probable due to chronic small vessel ischemic changes. 2. No cervical spine acute fracture or subluxation. Degenerative changes C6-C7 and C7-T1 level. No prevertebral soft tissue swelling. Critical Value/emergent results were called by telephone at the time of interpretation on 11/23/2012 at 3:39 PM to [facility] , who verbally acknowledged these results.   Electronically Signed   By: Natasha Mead M.D.   On: 11/23/2012 15:39   Dg Pelvis Portable  11/23/2012   CLINICAL DATA:  Larey Seat.  Pelvic pain.  EXAM: PORTABLE PELVIS  COMPARISON:  None.  FINDINGS: The hips are normally located. No acute hip fracture. The pubic symphysis and SI joints are intact. No definite pelvic fractures.  IMPRESSION: No acute bony findings.   Electronically Signed   By: Loralie Champagne M.D.   On: 11/23/2012 14:11   Dg Chest Portable 1 View  11/23/2012   CLINICAL DATA:  History of trauma from a fall.  EXAM: PORTABLE CHEST - 1 VIEW  COMPARISON:  CHEST x-ray 10/17/2009.  FINDINGS: Lung volumes are slightly low. No consolidative airspace disease. No pleural  effusions. No pneumothorax. No pulmonary nodule or mass noted. Pulmonary vasculature and the cardiomediastinal silhouette are within normal limits. Atherosclerosis in the thoracic aorta. Multiple acute left-sided rib fractures involving the lateral aspects of at least the left 5th through 7th ribs are noted.  IMPRESSION: 1. Multiple  acute displaced left-sided rib fractures involving at least ribs left 5-7. No definite associated pneumothorax at this time. 2. Atherosclerosis.   Electronically Signed   By: Trudie Reed M.D.   On: 11/23/2012 14:11    EKG: Independently reviewed; normal sinus rhythm, T-wave inversion in inferior leads with no reciprocal changes  Time spent: 80 minutes  Marinda Elk [facility] Pager [phone]  If 7PM-7AM, please contact night-coverage [email or web address] Password [email or web address] 11/23/2012, 4:41 PM

## 2012-11-24 ENCOUNTER — Encounter (HOSPITAL_COMMUNITY): Payer: Self-pay | Admitting: Radiology

## 2012-11-24 ENCOUNTER — Inpatient Hospital Stay (HOSPITAL_COMMUNITY): Payer: Medicare Other

## 2012-11-24 DIAGNOSIS — I519 Heart disease, unspecified: Secondary | ICD-10-CM

## 2012-11-24 LAB — BASIC METABOLIC PANEL
Calcium: 8.7 mg/dL (ref 8.4–10.5)
Chloride: 102 mEq/L (ref 96–112)
Creatinine, Ser: 1.15 mg/dL (ref 0.50–1.35)
GFR calc Af Amer: 75 mL/min — ABNORMAL LOW (ref >90)
GFR calc non Af Amer: 65 mL/min — ABNORMAL LOW (ref >90)
Potassium: 3.9 mEq/L (ref 3.5–5.1)

## 2012-11-24 LAB — CBC
MCV: 90.4 fL (ref 78.0–100.0)
Platelets: 180 10*3/uL (ref 150–400)
RDW: 12.7 % (ref 11.5–15.5)
WBC: 8.9 10*3/uL (ref 4.0–10.5)

## 2012-11-24 LAB — TROPONIN I
Troponin I: <0.3 ng/mL (ref <0.30)
Troponin I: <0.3 ng/mL (ref <0.30)

## 2012-11-24 MED ORDER — TRAMADOL HCL 50 MG PO TABS
100.0000 mg | ORAL_TABLET | Freq: Four times a day (QID) | ORAL | Status: DC
  Administered 2012-11-24: 50 mg via ORAL
  Administered 2012-11-24 (×2): 100 mg via ORAL
  Filled 2012-11-24 (×4): qty 2

## 2012-11-24 MED ORDER — ATORVASTATIN CALCIUM 20 MG PO TABS: 20.0000 mg | ORAL_TABLET | Freq: Every day | ORAL | Status: DC

## 2012-11-24 MED ORDER — HYDROCODONE-ACETAMINOPHEN 5-325 MG PO TABS: 0.5000 | ORAL_TABLET | ORAL | Status: DC | PRN

## 2012-11-24 MED ORDER — SODIUM CHLORIDE 0.9 % IV SOLN
INTRAVENOUS | Status: DC
  Administered 2012-11-24 – 2012-11-25 (×2): via INTRAVENOUS

## 2012-11-24 NOTE — Progress Notes (Signed)
UR completed.  Ellwood Steidle, RN BSN [facility] CCM Trauma/Neuro ICU Case Manager [phone]  

## 2012-11-24 NOTE — Evaluation (Signed)
Physical Therapy Evaluation Patient Details Name: Randall Reeves MRN: [id number] DOB: Jul 11, 1945 Today's Date: 11/25/2012 Time: 1535-1550 PT Time Calculation (min): 15 min  PT Assessment / Plan / Recommendation History of Present Illness  Randall Reeves is a 67 year old male with history of 20-15 foot fall from expandable ladder while cutting limbs off trees. Patient had loss of consciousness after the event and awoke on the way to the ED, complaining of left chest pain, back pain, wrist pain and severe headache. Wife states he fell flat on his back and a tree limb fell on his chest. Patient his a past medical history of hypertension and MI (3 years ago). His surgical history includes a hernia repair and vasectomy. 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 CT scan shows:  Unchanged 8 mm parenchymal hemorrhage within the right parietal, Multiple left-sided rib fractures with overlying soft tissue, Distal radius fx non operative management at this time.   Clinical Impression  Patient tolerated transfers and ambulation well with supervision guard.  Patient reports that he is still a little "woozy" due to his pain medication.    PT Assessment  Patient needs continued PT services    Follow Up Recommendations  No PT follow up;Supervision/Assistance - 24 hour    Equipment Recommendations  None recommended by PT    Frequency Min 5X/week    Precautions / Restrictions Restrictions Weight Bearing Restrictions: Yes RUE Weight Bearing: Non weight bearing   Pertinent Vitals/Pain Pain 0/10 at first, 3/10 when sitting, 0/10 when ambulating      Mobility  Bed Mobility Bed Mobility: Left Sidelying to Sit Left Sidelying to Sit: 5: Supervision Transfers Transfers: Sit to Stand Sit to Stand: 5: Supervision Stand to Sit: 5: Supervision Ambulation/Gait Ambulation/Gait Assistance: 5: Supervision Gait Pattern: Step-through  pattern General Gait Details: tentative step pattern at first    Exercises     PT Diagnosis: Difficulty walking;Generalized weakness;Acute pain  PT Problem List: Decreased strength;Decreased balance;Decreased mobility;Decreased coordination;Decreased safety awareness;Pain PT Treatment Interventions: Gait training;Stair training;Functional mobility training;Therapeutic activities;Therapeutic exercise;Balance training;Neuromuscular re-education     PT Goals(Current goals can be found in the care plan section) Acute Rehab PT Goals Patient Stated Goal: Return to home PT Goal Formulation: With patient/family Time For Goal Achievement: 12/01/12  Visit Information  Last PT Received On: 11/25/12 Assistance Needed: +1 History of Present Illness: Randall Reeves is a 67 year old male with history of 20-15 foot fall from expandable ladder while cutting limbs off trees. Patient had loss of consciousness after the event and awoke on the way to the ED, complaining of left chest pain, back pain, wrist pain and severe headache. Wife states he fell flat on his back and a tree limb fell on his chest. Patient his a past medical history of hypertension and MI (3 years ago). His surgical history includes a hernia repair and vasectomy. 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 CT scan shows:  Unchanged 8 mm parenchymal hemorrhage within the right parietal, Multiple left-sided rib fractures with overlying soft tissue, Distal radius fx non operative management at this time.        Prior Functioning  Home Living Family/patient expects to be discharged to:: Private residence Living Arrangements: Spouse/significant other Available Help at Discharge: Available 24 hours/day Type of Home: House Home Access: Stairs to enter [employer] of Steps: 4  Home Layout: One level Home Equipment: None Additional Comments: Seat in shower Prior  Function Level of Independence: Independent Communication Communication: No difficulties Dominant Hand: Right    Cognition  Cognition Arousal/Alertness: Awake/alert Behavior During Therapy: WFL for tasks assessed/performed Overall Cognitive Status: Within Functional Limits for tasks assessed    Extremity/Trunk Assessment Upper Extremity Assessment RUE Deficits / Details: R wrist fracture RUE Sensation:  (Pt reports fingers are numb occasionally) RUE Coordination: decreased fine motor;decreased gross motor Cervical / Trunk Assessment Cervical / Trunk Assessment: Normal   Balance Balance Balance Assessed: Yes Static Standing Balance Static Standing - Balance Support: No upper extremity supported Static Standing - Level of Assistance: 7: Independent;5: Stand by assistance Static Standing - Comment/# of Minutes: Pt needs to stand for ~ prior to ambulation due to hx of vertigo  End of Session PT - End of Session Equipment Utilized During Treatment: Gait belt Activity Tolerance: Patient tolerated treatment well Patient left: in chair  GP     Randall Reeves 11/25/2012, 7:50 AM  [name], SPT [place], PT DPT [phone]

## 2012-11-24 NOTE — Progress Notes (Signed)
Occupational Therapy Evaluation Patient Details Name: Randall Reeves MRN: [id number] DOB: November 17, 1945 Today's Date: 11/24/2012 Time: 4098-1191 OT Time Calculation (min): 20 min  OT Assessment / Plan / Recommendation History of present illness Randall Reeves is a 67 year old male with history of 20-15 foot fall from expandable ladder while cutting limbs off trees. Patient had loss of consciousness after the event and awoke on the way to the ED, complaining of left chest pain, back pain, wrist pain and severe headache. Wife states he fell flat on his back and a tree limb fell on his chest. Patient his a past medical history of hypertension and MI (3 years ago). His surgical history includes a hernia repair and vasectomy. 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 CT scan shows:  Unchanged 8 mm parenchymal hemorrhage within the right parietal, Multiple left-sided rib fractures with overlying soft tissue, Distal radius fx non operative management at this time.    Clinical Impression   PTA, pt independent with all ADL and mobility. Educated pt on edema control, including ice and edema. Educated on compensatory techniques for ADL. Pt sleepy - most likely due to pain meds. Wife verbalized understanding.     OT Assessment  Patient does not need any further OT services    Follow Up Recommendations  No OT follow up    Barriers to Discharge      Equipment Recommendations  Tub/shower seat    Recommendations for Other Services Rehab consult  Frequency       Precautions / Restrictions Restrictions Weight Bearing Restrictions: Yes RUE Weight Bearing: Non weight bearing   Pertinent Vitals/Pain no apparent distress     ADL  Eating/Feeding: Set up Where Assessed - Eating/Feeding: Chair Grooming: Set up Where Assessed - Grooming: Supported sitting Upper Body Bathing: Minimal assistance Where Assessed - Upper Body Bathing: Supported  sit to stand Lower Body Bathing: Min guard;Set up Where Assessed - Lower Body Bathing: Unsupported sit to stand Upper Body Dressing: Minimal assistance Where Assessed - Upper Body Dressing: Unsupported sitting Lower Body Dressing: Minimal assistance Where Assessed - Lower Body Dressing: Unsupported sit to stand Toilet Transfer: Supervision/safety Toilet Transfer Equipment: Comfort height toilet Toileting - Clothing Manipulation and Hygiene: Supervision/safety Where Assessed - Engineer, mining and Hygiene: Sit to stand from 3-in-1 or toilet Tub/Shower Transfer: Min guard Tub/Shower Transfer Method: Ambulating Equipment Used: Gait belt;Other (comment) (sling) Transfers/Ambulation Related to ADLs: S ADL Comments: Educated wife/pt oncempensatory techniques for ADL and RUE mgnt. recuse of ice and elevated positionoing to decrease edema . Ice for edema contorl.    OT Diagnosis:    OT Problem List:   OT Treatment Interventions:     OT Goals(Current goals can be found in the care plan section) Acute Rehab OT Goals Patient Stated Goal: Pt wants to be able to go home.  OT Goal Formulation: With patient Time For Goal Achievement:  (eval only)  Visit Information  Last OT Received On: 11/24/12 History of Present Illness: Randall Reeves is a 67 year old male with history of 20-15 foot fall from expandable ladder while cutting limbs off trees. Patient had loss of consciousness after the event and awoke on the way to the ED, complaining of left chest pain, back pain, wrist pain and severe headache. Wife states he fell flat on his back and a tree limb fell on his chest. Patient his a past medical history of hypertension and  MI (3 years ago). His surgical history includes a hernia repair and vasectomy. 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 CT scan shows:  Unchanged 8 mm parenchymal hemorrhage within the right parietal,  Multiple left-sided rib fractures with overlying soft tissue, Distal radius fx non operative management at this time.        Prior Functioning     Home Living Family/patient expects to be discharged to:: Private residence Living Arrangements: Spouse/significant other Available Help at Discharge: Available 24 hours/day Type of Home: House Home Access: Stairs to enter Secretary/administrator of Steps: 4 Home Layout: One level Home Equipment: None Prior Function Level of Independence: Independent Communication Communication: No difficulties Dominant Hand: Right         Vision/Perception Vision - History Baseline Vision: Wears glasses all the time Patient Visual Report: No change from baseline Vision - Assessment Eye Alignment: Within Functional Limits Vision Assessment: Vision tested Additional Comments: no visual deficits Perception Perception: Within Functional Limits Praxis Praxis: Intact   Cognition  Cognition Arousal/Alertness: Awake/alert Behavior During Therapy: WFL for tasks assessed/performed Overall Cognitive Status: Within Functional Limits for tasks assessed    Extremity/Trunk Assessment Upper Extremity Assessment Upper Extremity Assessment: RUE deficits/detail RUE Deficits / Details: R wrist fracture RUE: Unable to fully assess due to immobilization RUE Sensation:  (Pt reports fingers are numb occasionally) RUE Coordination: decreased fine motor;decreased gross motor Lower Extremity Assessment Lower Extremity Assessment: Overall WFL for tasks assessed Cervical / Trunk Assessment Cervical / Trunk Assessment: Normal     Mobility Bed Mobility Bed Mobility: Left Sidelying to Sit Left Sidelying to Sit: 5: Supervision Transfers Transfers: Sit to Stand;Stand to Sit Sit to Stand: 5: Supervision Stand to Sit: 5: Supervision     Exercise Other Exercises Other Exercises: encouraged BUE A/AAROM within pain tolerance to prevent frozen houlder. Other  Exercises: self shoulder ROM   Balance Balance Balance Assessed: Yes Standardized Balance Assessment Standardized Balance Assessment:  (Pt with hx of vertigo. WNL for AEDL)   End of Session OT - End of Session Equipment Utilized During Treatment: Gait belt Activity Tolerance: Patient tolerated treatment well Patient left: in chair;with call bell/phone within reach;with nursing/sitter in room Nurse Communication: Mobility status  GO     Honestee Revard,HILLARY 11/24/2012, 4:56 PM [facility], OTR/L  [phone] 11/24/2012

## 2012-11-24 NOTE — Progress Notes (Signed)
Patient ID: Randall Reeves, male   DOB: [birth year], 67 y.o.   MRN: [id number] Neuro stable. Ct shows lesion unchanged. Syncopal w/u in progress

## 2012-11-24 NOTE — Clinical Social Work Note (Signed)
Clinical Social Work Department BRIEF PSYCHOSOCIAL ASSESSMENT 11/24/2012  Patient:  Randall Reeves, Randall Reeves     Account Number:  [id number]     Admit date:  11/23/2012  Clinical Social Worker:  Verl Blalock  Date/Time:  11/24/2012 12:00 N  Referred by:  Physician  Date Referred:  11/24/2012 Referred for  Psychosocial assessment   Other Referral:   Interview type:  Patient Other interview type:   Patient wife at bedside    PSYCHOSOCIAL DATA Living Status:  WIFE Admitted from facility:   Level of care:   Primary support name:  Gorelik,Linda  [phone] Primary support relationship to patient:  SPOUSE Degree of support available:   Strong    CURRENT CONCERNS Current Concerns  None Noted   Other Concerns:    SOCIAL WORK ASSESSMENT / PLAN Clinical Social Worker met with patient at bedside to offer support and discuss patient needs at discharge.  Patient states that he was cutting limbs in his yard and fell several feet off a ladder.  Patient currently lives at home with his wife and is anxious and planning to return home with his wife at discharge.  Patient wife is available 24/7 as needed for patient assistance at discharge.    Clinical Social Worker inquired about patient current substance use.  Patient states that he seldomly drinks any alcohol.  Patient was clear in stating that it would take him a year to finish a 1/5 of liquor.  SBIRT complete and no resources provided at this time.  CSW signing off. Please reconsult if further needs arise prior to discharge.   Assessment/plan status:  No Further Intervention Required Other assessment/ plan:   Information/referral to community resources:   Visual merchandiser offered resources, however patient states no resources are necessary at this time.    PATIENT'S/FAMILY'S RESPONSE TO PLAN OF CARE: Patient alert and oriented x3 laying in the bed.  Patient wife supportive at bedside and grasping understanding on what she is able to assist  patient with in the hospital to practice for when they go home.  Patient and family anxious to work with therapies and discharge home.  Patient and wife understanding of social work role and appreciative of support.

## 2012-11-24 NOTE — Progress Notes (Signed)
  Echocardiogram 2D Echocardiogram has been performed.  Arvil Chaco 11/24/2012, 9:36 AM

## 2012-11-24 NOTE — Progress Notes (Signed)
[facility] CONSULT F/U Note [place] TEAM 1 - Stepdown/ICU TEAM   Darly Fails Haese ZHY:[id number] DOB: 12-10-1945 DOA: 11/23/2012 PCP: Tillman Abide, MD  Admit HPI / Brief Narrative: 67 year old male with past medical history of hypertension, and a history of non-STEMI in 2012, who came in for a fall from a ladder. He related he was cutting some limbs from an extension ladder, and ended up on the ground.  It is unclear if he passed out and then fell, or vice-versa. He had no prodromal symptoms. He relates no swelling, shortness of breath, chest pain, change in vision, double vision, sweating, nausea, vomiting, no episodes of imbalance.  Assessment/Plan:  Syncope and collapse vs/ fall >> unconscious state It is not clear to me this patient actually suffered a syncopal spell - nonetheless I find no worrisome issues on physical exam - he does appear to have been significantly dehydrated at the time of his admission - I will gently hydrate overnight - we will followup his echocardiogram  Dehydration Gently hydrate overnight as discussed above  AKI Appears to be primarily prerenal azotemia - hydrate and followup in a.m.  HYPERTENSION Not an active concern at this time  Fall >> Multiple fractures of ribs of left side / Right wrist fracture / TBI w/ ICC As per primary service trauma surgery  Splenic artery aneurysm Likely an incidental finding - as per primary service trauma surgery  Code Status: FULL Family Communication: Discussed care plan with patient and wife at bedtime Disposition Plan: At discretion of primary service - from medical standpoint should be stable for discharge by November 1 unless significant findings appreciated on echocardiogram  Procedures: TTE - pending   Antibiotics: None  DVT prophylaxis: SCDs  HPI/Subjective: The patient is sedated with pain medication but is able to answer most questions.  He denies chest pain shortness of breath fevers chills  dizziness lightheadedness or headache.  Objective: Blood pressure 130/94, pulse 89, temperature 97.9 F (36.6 C), temperature source Oral, resp. rate 11, height 5\' 10"  (1.778 m), weight 87.091 kg (192 lb), SpO2 95.00%.  Intake/Output Summary (Last 24 hours) at 11/24/12 1254 Last data filed at 11/24/12 0800  Gross per 24 hour  Intake   2580 ml  Output   1475 ml  Net   1105 ml   Exam: General: No acute respiratory distress Lungs: Clear to auscultation bilaterally without wheezes or crackles Cardiovascular: Regular rate and rhythm without murmur gallop or rub normal S1 and S2 Abdomen: Nontender, nondistended, soft, bowel sounds positive, no rebound, no ascites, no appreciable mass Extremities: No significant cyanosis, clubbing, or edema bilateral lower extremities  Data Reviewed: Basic Metabolic Panel:  Recent Labs Lab 11/23/12 1350 11/23/12 1405 11/24/12 0520  NA 136 140 136  K 4.6 4.4 3.9  CL 101 105 102  CO2 25  --  24  GLUCOSE 101* 98 105*  BUN 30* 31* 23  CREATININE 1.40* 1.70* 1.15  CALCIUM 9.2  --  8.7   Liver Function Tests: No results found for this basename: AST, ALT, ALKPHOS, BILITOT, PROT, ALBUMIN,  in the last 168 hours  CBC:  Recent Labs Lab 11/23/12 1350 11/23/12 1405 11/24/12 0520  WBC 9.6  --  8.9  HGB 15.5 16.0 14.7  HCT 43.6 47.0 41.5  MCV 91.0  --  90.4  PLT 208  --  180   Cardiac Enzymes:  Recent Labs Lab 11/23/12 1836 11/24/12 0100 11/24/12 0520  TROPONINI <0.30 <0.30 <0.30    Recent Results (  from the past 240 hour(s))  MRSA PCR SCREENING     Status: Abnormal   Collection Time    11/23/12  5:34 PM      Result Value Range Status   MRSA by PCR POSITIVE (*) NEGATIVE Final   Comment:            The GeneXpert MRSA Assay (FDA     approved for NASAL specimens     only), is one component of a     comprehensive MRSA colonization     surveillance program. It is not     intended to diagnose MRSA     infection nor to guide or      monitor treatment for     MRSA infections.     RESULT CALLED TO, READ BACK BY AND VERIFIED WITHFestus Holts RN [id number] 11/23/12 A BROWNING     Studies:  Recent x-ray studies have been reviewed in detail by the Attending Physician  Scheduled Meds:  Scheduled Meds: . atorvastatin  20 mg Oral Daily  . Chlorhexidine Gluconate Cloth  6 each Topical Q0600  . docusate sodium  100 mg Oral BID  . losartan  50 mg Oral Daily  . mupirocin ointment  1 application Nasal BID  . pantoprazole  40 mg Oral Daily   Or  . pantoprazole (PROTONIX) IV  40 mg Intravenous Daily  . polyethylene glycol  17 g Oral Daily  . traMADol  100 mg Oral Q6H    Time spent on care of this patient: 35 mins   [facility] T  [facility] Office  [phone] Pager - Text Page per Loretha Stapler as per below:  On-Call/Text Page:      [email or web address]      password [email or web address]  If 7PM-7AM, please contact night-coverage [email or web address] Password [email or web address] 11/24/2012, 12:54 PM   LOS: 1 day

## 2012-11-24 NOTE — Progress Notes (Signed)
I do not believe that the small incidentally found splenic aneurysm (not pseudoaneurysm) is clinically significant or related to his trauma.  I will discuss with vascular surgery.  Getting echocardiogram currently.  Will transfer with telemetry.  This patient has been seen and I agree with the findings and treatment plan.  Marta Lamas. Gae Bon, MD, FACS [phone] (pager) [phone] (direct pager) Trauma Surgeon

## 2012-11-24 NOTE — Progress Notes (Signed)
Patient ID: Randall Reeves, male   DOB: 11-19-1945, 67 y.o.   MRN: [id number]   LOS: 1 day   Subjective: Feeling better today. Says pain medicine working well but causing too much somnolence.   Objective: Vital signs in last 24 hours: Temp:  [97.8 F (36.6 C)-98.5 F (36.9 C)] 97.9 F (36.6 C) (10/31 0800) Pulse Rate:  [87-110] 87 (10/31 0800) Resp:  [11-26] 12 (10/31 0800) BP: (125-145)/(78-115) 137/86 mmHg (10/31 0800) SpO2:  [91 %-97 %] 95 % (10/31 0800) Weight:  [192 lb (87.091 kg)] 192 lb (87.091 kg) (10/30 1421)    IS:   Laboratory  CBC  Recent Labs  11/23/12 1350 11/23/12 1405 11/24/12 0520  WBC 9.6  --  8.9  HGB 15.5 16.0 14.7  HCT 43.6 47.0 41.5  PLT 208  --  180   BMET  Recent Labs  11/23/12 1350 11/23/12 1405 11/24/12 0520  NA 136 140 136  K 4.6 4.4 3.9  CL 101 105 102  CO2 25  --  24  GLUCOSE 101* 98 105*  BUN 30* 31* 23  CREATININE 1.40* 1.70* 1.15  CALCIUM 9.2  --  8.7    Radiology Results CT HEAD WITHOUT CONTRAST  TECHNIQUE:  Contiguous axial images were obtained from the base of the skull  through the vertex without intravenous contrast.  COMPARISON: Prior CT from 11/23/2012  FINDINGS:  Previously identified small parenchymal hemorrhage within the right  parietal lobe is stable in size and appearance measuring 8 mm. No  new intracranial hemorrhage is identified. No mass effect or midline  shift. There is no intraventricular or extra-axial hemorrhage. No  hydrocephalus. No acute intracranial infarct. Chronic microvascular  ischemic changes are again noted, unchanged.  Calvarium remains intact. Orbits are normal.  Paranasal sinuses and mastoid air cells are clear.  IMPRESSION:  Unchanged 8 mm parenchymal hemorrhage within the right parietal  lobe. No new intracranial hemorrhage identified. No hydrocephalus or  midline shift.  Electronically Signed  By: Rise Mu M.D.  On: 11/24/2012 04:52  PORTABLE CHEST - 1 VIEW   COMPARISON: None.  FINDINGS:  The heart size is exaggerated by low lung volumes. Lung volumes have  decreased since the prior study. Mild pulmonary vascular congestion  is now evident. Left-sided rib fractures are again seen. Mild  pleural thickening is noted. There is no definite pneumothorax.  Minimal bibasilar atelectasis is present.  IMPRESSION:  1. Left-sided rib fractures with slight pleural thickening that no  definite pneumothorax.  2. Decreasing lung volumes and developing mild pulmonary vascular  congestion.  Electronically Signed  By: Gennette Pac M.D.  On: 11/24/2012 07:20   Physical Exam General appearance: alert and no distress Resp: clear to auscultation bilaterally Cardio: regular rate and rhythm GI: normal findings: bowel sounds normal and soft, non-tender   Assessment/Plan: Fall TBI w/ICC -- HCT stable, neuro intact Multiple left rib fxs -- Pulmonary toilet Right wrist fx -- Splinted, non-operative treatment for now, f/u as OP Splenic artery aneurysm -- Will ask vascular for opinion, suspect OP f/u Syncope -- Appreciate IM workup, undergoing echo now Multiple medical problems -- Home meds FEN -- Advance diet, SL IV. Will schedule tramadol, change narc to low dose Norco VTE -- SCD's Dispo -- To tele    Freeman Caldron, PA-C Pager: [phone] General Trauma PA Pager: [phone]   11/24/2012

## 2012-11-25 DIAGNOSIS — I728 Aneurysm of other specified arteries: Secondary | ICD-10-CM | POA: Diagnosis present

## 2012-11-25 LAB — BASIC METABOLIC PANEL
CO2: 21 mEq/L (ref 19–32)
GFR calc non Af Amer: 70 mL/min — ABNORMAL LOW (ref >90)
Glucose, Bld: 107 mg/dL — ABNORMAL HIGH (ref 70–99)
Potassium: 4.1 mEq/L (ref 3.5–5.1)
Sodium: 133 mEq/L — ABNORMAL LOW (ref 135–145)

## 2012-11-25 MED ORDER — HYDROCODONE-ACETAMINOPHEN 5-325 MG PO TABS: 0.5000 | ORAL_TABLET | ORAL | Status: DC | PRN

## 2012-11-25 NOTE — Progress Notes (Signed)
Discharge instructions gone over with patient. Home medications gone over. Prescription given. Follow up appointments to be made. Sling is on rt arm. My chart discussed. Activity and restrictions discussed. Signs and symptoms of worsening condition gone over. Patient verbalized understanding of instructions.

## 2012-11-25 NOTE — Progress Notes (Signed)
Syncope and collapse  Assessment: Stable and believe he can be discharged today  Plan: Discharge. Will check with vascular about the splenic artery aneurysm. Will need f/u with ortho for wrist fx   Subjective: Feels OK and wants to go home. Tolerating diet and pain well controlled  Objective: Vital signs in last 24 hours: Temp:  [97.7 F (36.5 C)-98.5 F (36.9 C)] 98.2 F (36.8 C) (11/01 0515) Pulse Rate:  [86-94] 86 (11/01 0515) Resp:  [16-18] 17 (11/01 0515) BP: (114-138)/(68-92) 138/92 mmHg (11/01 0515) SpO2:  [92 %-95 %] 95 % (11/01 0515) Last BM Date: 11/22/12  Intake/Output from previous day: 10/31 0701 - 11/01 0700 In: 555 [P.O.:480; I.V.:75] Out: -   General appearance: alert, cooperative and no distress Resp: clear to auscultation bilaterally GI: soft, non-tender; bowel sounds normal; no masses,  no organomegaly  Lab Results:  Results for orders placed during the hospital encounter of 11/23/12 (from the past 24 hour(s))  BASIC METABOLIC PANEL     Status: Abnormal   Collection Time    11/25/12  6:18 AM      Result Value Range   Sodium 133 (*) 135 - 145 mEq/L   Potassium 4.1  3.5 - 5.1 mEq/L   Chloride 99  96 - 112 mEq/L   CO2 21  19 - 32 mEq/L   Glucose, Bld 107 (*) 70 - 99 mg/dL   BUN 21  6 - 23 mg/dL   Creatinine, Ser 1.61  0.50 - 1.35 mg/dL   Calcium 8.6  8.4 - 09.6 mg/dL   GFR calc non Af Amer 70 (*) >90 mL/min   GFR calc Af Amer 81 (*) >90 mL/min     Studies/Results Radiology  TTE was basically negative   MEDS, Scheduled . atorvastatin  20 mg Oral Daily  . Chlorhexidine Gluconate Cloth  6 each Topical Q0600  . docusate sodium  100 mg Oral BID  . mupirocin ointment  1 application Nasal BID  . pantoprazole  40 mg Oral Daily   Or  . pantoprazole (PROTONIX) IV  40 mg Intravenous Daily  . polyethylene glycol  17 g Oral Daily  . traMADol  100 mg Oral Q6H       LOS: 2 days    Currie Paris, MD, [facility],  [place] [phone]   11/25/2012 10:10 AM

## 2012-11-25 NOTE — Progress Notes (Addendum)
Physical Therapy Treatment Patient Details Name: Randall Reeves MRN: [id number] DOB: 06-11-1945 Today's Date: 11/25/2012 Time: 1130-1205 PT Time Calculation (min): 35 min  PT Assessment / Plan / Recommendation  History of Present Illness Randall Reeves is a 67 year old male with history of 20-15 foot fall from expandable ladder while cutting limbs off trees. Patient had loss of consciousness after the event and awoke on the way to the ED, complaining of left chest pain, back pain, wrist pain and severe headache. Wife states he fell flat on his back and a tree limb fell on his chest. Patient his a past medical history of hypertension and MI (3 years ago). His surgical history includes a hernia repair and vasectomy. 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 CT scan shows:  Unchanged 8 mm parenchymal hemorrhage within the right parietal, Multiple left-sided rib fractures with overlying soft tissue, Distal radius fx non operative management at this time.    PT Comments   Pt admitted with above. Pt currently with functional limitations due to balance and endurance deficits.  Wife aware that she will need to provide 24 hour care and ambulate with pt until he returns to baseline.   Pt will benefit from skilled PT to increase their independence and safety with mobility to allow discharge to the venue listed below.   Follow Up Recommendations  No PT follow up initially but eventually will benefit from Outpt PT/vestibular rehab)                           Frequency Min 5X/week   Progress towards PT Goals Progress towards PT goals: Progressing toward goals  Plan Current plan remains appropriate    Precautions / Restrictions Precautions Precautions: Fall Restrictions Weight Bearing Restrictions: Yes RUE Weight Bearing: Non weight bearing Other Position/Activity Restrictions: sling   Pertinent Vitals/Pain VSS, No pain    Mobility  Bed Mobility Bed Mobility: Rolling Right;Right Sidelying to Sit;Sitting - Scoot to Edge of Bed Rolling Right: 5: Supervision Right Sidelying to Sit: 4: Min assist;HOB flat Left Sidelying to Sit: Not tested (comment) Sitting - Scoot to Edge of Bed: 5: Supervision Details for Bed Mobility Assistance: Pt's wife mentioned he took Meclizine for vertigo.  Tested for BPPV upon return to room.  Pt appeared to have symptoms positive for right BPPV per testing.  At this time, feel that it will be difficult to treat pt for BPPV given his right UE fracture and left ribs fractured.  Therefore educated wife and pt to f/u at Outpt. PT for vestibular rehab when fractures heal as PT feels that pt can benefit and possibly not have to take Meclizine as he is currently on Meclizine per wife.  Pt and wife agree.  Spent incr time educating pt and wife to process of BPPv and gave handouts.   Transfers Transfers: Sit to Stand;Stand to Sit Sit to Stand: 5: Supervision Stand to Sit: 5: Supervision Details for Transfer Assistance: widens BOS for incr stability. Ambulation/Gait Ambulation/Gait Assistance: 4: Min guard Ambulation Distance (Feet): 350 Feet Assistive device: None Ambulation/Gait Assistance Details: Pt slow and guarded and does widen his BOS for stability.  No significant LOB with ambulation.  No challenges given either.   Gait Pattern: Step-through pattern;Wide base of support General Gait Details: tentative step pattern at first Stairs: Yes Stairs Assistance: 4: Min guard Stair Management Technique: One rail Right;Sideways;Step  to pattern;Forwards Number of Stairs: 3 Wheelchair Mobility Wheelchair Mobility: No    PT Goals (current goals can now be found in the care plan section)    Visit Information  Last PT Received On: 11/25/12 Assistance Needed: +1 History of Present Illness: Randall Reeves is a 67 year old male with history of 20-15 foot fall from expandable ladder while cutting limbs off trees.  Patient had loss of consciousness after the event and awoke on the way to the ED, complaining of left chest pain, back pain, wrist pain and severe headache. Wife states he fell flat on his back and a tree limb fell on his chest. Patient his a past medical history of hypertension and MI (3 years ago). His surgical history includes a hernia repair and vasectomy. 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 CT scan shows:  Unchanged 8 mm parenchymal hemorrhage within the right parietal, Multiple left-sided rib fractures with overlying soft tissue, Distal radius fx non operative management at this time.     Subjective Data  Subjective: "I feel ok."   Cognition  Cognition Arousal/Alertness: Awake/alert Behavior During Therapy: WFL for tasks assessed/performed Overall Cognitive Status: Within Functional Limits for tasks assessed    Balance  Static Standing Balance Static Standing - Balance Support: No upper extremity supported Static Standing - Level of Assistance: 7: Independent;5: Stand by assistance Static Standing - Comment/# of Minutes: Stood 2 min statically.    End of Session PT - End of Session Equipment Utilized During Treatment: Gait belt Activity Tolerance: Patient tolerated treatment well Patient left: in chair;with call bell/phone within reach;with family/visitor present Nurse Communication: Mobility status        INGOLD,Purnell Daigle 11/25/2012, 1:42 PM [facility] [phone] (pager)

## 2012-11-25 NOTE — Discharge Summary (Signed)
Physician Discharge Summary  Randall Reeves ZOX:[id number] DOB: January 22, 1946 DOA: 11/23/2012  PCP: Randall Abide, Reeves  Consultation: Dr. Botero([facility])    Dr. Turner Reeves)  Admit date: 11/23/2012 Discharge date: 11/25/2012  Recommendations for Outpatient Follow-up:   Follow-up Information   Follow up with Randall Covert, Reeves. Schedule an appointment as soon as possible for a visit in 7 days. (we incidentally found a aneursym/weakness in your spleen artery.  you will need to follow up with a vascular surgeon.)    Specialty:  Orthopedic Surgery   Contact information:   [address] [place] [address] [phone]       Follow up with Randall Reeves. (As needed)    Contact information:   [address] [place] [address] [phone]       Follow up with vascular surgery.   Contact information:   [phone]      Follow up with Randall Cassis, Reeves. Schedule an appointment as soon as possible for a visit in 4 weeks.   Specialty:  Neurosurgery   Contact information:   [address] [place] [address] [phone]      Discharge Diagnoses:  1. Fall 2. TBI  3. Intracranial hemorrhage 4. Rb fractures 5. Right wrist fracture 6. Splenic artery aneurysm    Surgical Procedure: none  Discharge Condition: stable Disposition: home  Diet recommendation: regular  Filed Weights   11/23/12 1421 11/24/12 1000  Weight: 192 lb (87.091 kg) 187 lb 2.7 oz (84.9 kg)     Filed Vitals:   11/25/12 1030  BP: 145/94  Pulse: 80  Temp: 98.2 F (36.8 C)  Resp: 20     Hospital Course:  Randall Reeves is a 67 year old male who presented to [facility] following a 15-20 foot fall from a ladder.  He reports LOC, awoke on the way to the ED complaining of left sided chest pains and a severe headache.  He was found to have a intracracnial hemorrhage, Randall Reeves was consulted who recommended observation and repeat  scanning.  There was a question of possible syncope and therefore IM was consulted for a syncope evaluation.  Randall Reeves was consulted for the wrist fracture.  He was admitted to the ICU for monitoring and repeat CT scan in the morning.  Incidentally he was found to have a splenic artery aneurysm, this was discussed with vascular over the telephone who recommended a work up.  I provided the patient with the telephone to vascular, however, stated he should follow up with pcp in 1-2 weeks who can refer him to ensure follow up.  Wrist fracture was non operative and the patient was advised to follow up as outpatient.  He remained neurologically intact.  His vital signs remained stable.  Labs remained stable.  A TEE was negative with a negative syncope work up.  A repeat CT of head remained stable.  He was transferred to the floor and mobilized.  His pain was under good control and he was ambulating.  He was therefore felt stable for discharge.  We discussed medication side effects, follow up with [facility] which in approximately 4 weeks and follow up with ortho.  The patient and the wife verbalize understanding.  He is going home with wife 24 hour supervision.  They were encouraged to call with questions or concerns.     Discharge Instructions  Discharge Orders   Future Appointments Provider Department Dept  Phone   06/12/2013 9:00 AM Randall Reeves [facility] [phone]   Future Orders Complete By Expires   Diet - low sodium heart healthy  As directed    Increase activity slowly  As directed    Anselmi shower / Bathe  As directed    Comments:     Starting tomorrow   No wound care  As directed        Medication List         aspirin 81 MG chewable tablet  Chew 81 mg by mouth daily.     atorvastatin 40 MG tablet  Commonly known as:  LIPITOR  Take 20 mg by mouth daily.     atorvastatin 20 MG tablet  Commonly known as:  LIPITOR  take 1/2 tablet by mouth once daily      HYDROcodone-acetaminophen 5-325 MG per tablet  Commonly known as:  NORCO/VICODIN  Take 0.5-2 tablets by mouth every 4 (four) hours as needed.     losartan 50 MG tablet  Commonly known as:  COZAAR  Take 50 mg by mouth daily.     meclizine 25 MG tablet  Commonly known as:  ANTIVERT  Take 25 mg by mouth daily as needed for dizziness.     multivitamin tablet  Take 1 tablet by mouth daily.     omeprazole 20 MG capsule  Commonly known as:  PRILOSEC  Take 20 mg by mouth daily.     tadalafil 5 MG tablet  Commonly known as:  CIALIS  Take 5 mg by mouth daily as needed for erectile dysfunction.           Follow-up Information   Follow up with Randall Covert, Reeves. Schedule an appointment as soon as possible for a visit in 7 days. (we incidentally found a aneursym/weakness in your spleen artery.  you will need to follow up with a vascular surgeon.)    Specialty:  Orthopedic Surgery   Contact information:   [address] [place] [address] [phone]       Follow up with Randall Reeves. (As needed)    Contact information:   [address] [place] [address] [phone]       Follow up with vascular surgery.   Contact information:   [phone]      Follow up with Randall Cassis, Reeves. Schedule an appointment as soon as possible for a visit in 4 weeks.   Specialty:  Neurosurgery   Contact information:   [address] [place] [address] [phone]        The results of significant diagnostics from this hospitalization (including imaging, microbiology, ancillary and laboratory) are listed below for reference.    Significant Diagnostic Studies: Dg Wrist Complete Right  11/23/2012   CLINICAL DATA:  Pain post fall  EXAM: RIGHT WRIST - COMPLETE 3+ VIEW  COMPARISON:  None.  FINDINGS: Four views of the right wrist submitted. There is nondisplaced fracture in distal right radius.  Fracture line is involving the articular surface of the radius.  IMPRESSION: Nondisplaced fracture in distal right radius.   Electronically Signed   By: Natasha Mead M.D.   On: 11/23/2012 15:44   Ct Head Wo Contrast  11/24/2012   CLINICAL DATA:  Followup intracranial hemorrhage  EXAM: CT HEAD WITHOUT CONTRAST  TECHNIQUE: Contiguous axial images were obtained from the base of the skull through the vertex without  intravenous contrast.  COMPARISON:  Prior CT from 11/23/2012  FINDINGS: Previously identified small parenchymal hemorrhage within the right parietal lobe is stable in size and appearance measuring 8 mm. No new intracranial hemorrhage is identified. No mass effect or midline shift. There is no intraventricular or extra-axial hemorrhage. No hydrocephalus. No acute intracranial infarct. Chronic microvascular ischemic changes are again noted, unchanged.  Calvarium remains intact.  Orbits are normal.  Paranasal sinuses and mastoid air cells are clear.  IMPRESSION: Unchanged 8 mm parenchymal hemorrhage within the right parietal lobe. No new intracranial hemorrhage identified. No hydrocephalus or midline shift.   Electronically Signed   By: Rise Mu M.D.   On: 11/24/2012 04:52   Ct Head Wo Contrast  11/23/2012   CLINICAL DATA:  Fall, left side headache, left shoulder pain  EXAM: CT HEAD WITHOUT CONTRAST  CT CERVICAL SPINE WITHOUT CONTRAST  TECHNIQUE: Multidetector CT imaging of the head and cervical spine was performed following the standard protocol without intravenous contrast. Multiplanar CT image reconstructions of the cervical spine were also generated.  COMPARISON:  None.  FINDINGS: CT HEAD FINDINGS  No skull fracture is noted. Paranasal sinuses and mastoid air cells are unremarkable.  No mass effect or midline shift. There is a single focus of parenchymal petechial hemorrhage in right parietal lobe posteriorly axial image 24 measures about 8 mm. No intraventricular hemorrhage. Mild  periventricular white matter decreased attenuation probable due to chronic small vessel ischemic changes. No acute cortical infarction.  CT CERVICAL SPINE FINDINGS  Axial images of the cervical spine shows no acute fracture or subluxation. Mild degenerative changes C1-C2 articulation. There is disc space flattening with mild anterior and mild posterior spurring at C6-C7 level. Mild disc space flattening with mild anterior and mild posterior spurring at C7-T1 level. No prevertebral soft tissue swelling. There is no pneumothorax in visualized lung apices.  IMPRESSION: 1. There is a single small focus of petechial parenchymal hemorrhage in right parietal lobe posteriorly axial image 24 measures about 8 mm. No intraventricular hemorrhage. No mass effect or midline shift. Mild periventricular white matter decreased attenuation probable due to chronic small vessel ischemic changes. 2. No cervical spine acute fracture or subluxation. Degenerative changes C6-C7 and C7-T1 level. No prevertebral soft tissue swelling. Critical Value/emergent results were called by telephone at the time of interpretation on 11/23/2012 at 3:39 PM to [facility] , who verbally acknowledged these results.   Electronically Signed   By: Natasha Mead M.D.   On: 11/23/2012 15:39   Ct Chest W Contrast  11/23/2012   CLINICAL DATA:  Fall. Coronary artery atherosclerosis. Hypertension. Left-sided shoulder pain.  EXAM: CT CHEST WITH CONTRAST  TECHNIQUE: Multidetector CT imaging of the chest was performed during intravenous contrast administration.  CONTRAST:  80mL OMNIPAQUE IOHEXOL 300 MG/ML  SOLN  COMPARISON:  Plain films of earlier today.  FINDINGS: Lungs/Pleura: Minimal linear atelectasis or scarring in the right upper lobe on image 19/ series 3. 3 mm probable subpleural lymph node along the right minor fissure on image 28/series 3. Dependent subsegmental atelectasis within both lung bases. No pneumothorax. Minimal left-sided pleural thickening  adjacent to fractures, likely due to trace hemothorax.  Heart/Mediastinum: Aortic atherosclerosis. Normal heart size, without pericardial effusion. Normal appearance of the thoracic aorta, without evidence of mediastinal hematoma.  No mediastinal or hilar adenopathy.  Upper Abdomen: Degradation secondary to overlying wire air and lead artifact. Suspect a splenic artery aneurysm of 8 mm on image 53/ series 2. No upper abdominal free intraperitoneal air.  Bones/Musculoskeletal: Multiple left-sided rib fractures laterally. Minimally displaced 4th anterior. Displaced 5th and 6th lateral. Mildly comminuted lateral 7th rib fracture. Incompletely imaged minimally displaced left 8th rib fracture. Overlying soft tissue hematoma.  IMPRESSION: 1. Multiple left-sided rib fractures with overlying soft tissue swelling and minimal adjacent left hemothorax. 2. No other posttraumatic deformity identified. 3. Suspicion of an 8 mm splenic artery aneurysm.   Electronically Signed   By: Jeronimo Greaves M.D.   On: 11/23/2012 15:48   Ct Cervical Spine Wo Contrast  11/23/2012   CLINICAL DATA:  Fall, left side headache, left shoulder pain  EXAM: CT HEAD WITHOUT CONTRAST  CT CERVICAL SPINE WITHOUT CONTRAST  TECHNIQUE: Multidetector CT imaging of the head and cervical spine was performed following the standard protocol without intravenous contrast. Multiplanar CT image reconstructions of the cervical spine were also generated.  COMPARISON:  None.  FINDINGS: CT HEAD FINDINGS  No skull fracture is noted. Paranasal sinuses and mastoid air cells are unremarkable.  No mass effect or midline shift. There is a single focus of parenchymal petechial hemorrhage in right parietal lobe posteriorly axial image 24 measures about 8 mm. No intraventricular hemorrhage. Mild periventricular white matter decreased attenuation probable due to chronic small vessel ischemic changes. No acute cortical infarction.  CT CERVICAL SPINE FINDINGS  Axial images of the  cervical spine shows no acute fracture or subluxation. Mild degenerative changes C1-C2 articulation. There is disc space flattening with mild anterior and mild posterior spurring at C6-C7 level. Mild disc space flattening with mild anterior and mild posterior spurring at C7-T1 level. No prevertebral soft tissue swelling. There is no pneumothorax in visualized lung apices.  IMPRESSION: 1. There is a single small focus of petechial parenchymal hemorrhage in right parietal lobe posteriorly axial image 24 measures about 8 mm. No intraventricular hemorrhage. No mass effect or midline shift. Mild periventricular white matter decreased attenuation probable due to chronic small vessel ischemic changes. 2. No cervical spine acute fracture or subluxation. Degenerative changes C6-C7 and C7-T1 level. No prevertebral soft tissue swelling. Critical Value/emergent results were called by telephone at the time of interpretation on 11/23/2012 at 3:39 PM to [facility] , who verbally acknowledged these results.   Electronically Signed   By: Natasha Mead M.D.   On: 11/23/2012 15:39   Dg Pelvis Portable  11/23/2012   CLINICAL DATA:  Larey Seat.  Pelvic pain.  EXAM: PORTABLE PELVIS  COMPARISON:  None.  FINDINGS: The hips are normally located. No acute hip fracture. The pubic symphysis and SI joints are intact. No definite pelvic fractures.  IMPRESSION: No acute bony findings.   Electronically Signed   By: Loralie Champagne M.D.   On: 11/23/2012 14:11   Dg Chest Port 1 View  11/24/2012   CLINICAL DATA:  Rib fractures.  EXAM: PORTABLE CHEST - 1 VIEW  COMPARISON:  None.  FINDINGS: The heart size is exaggerated by low lung volumes. Lung volumes have decreased since the prior study. Mild pulmonary vascular congestion is now evident. Left-sided rib fractures are again seen. Mild pleural thickening is noted. There is no definite pneumothorax. Minimal bibasilar atelectasis is present.  IMPRESSION: 1. Left-sided rib fractures with slight  pleural thickening that no definite pneumothorax. 2. Decreasing lung volumes and developing mild pulmonary vascular congestion.   Electronically Signed   By: Gennette Pac M.D.   On: 11/24/2012 07:20   Dg Chest Portable 1 View  11/23/2012   CLINICAL DATA:  History of trauma from a fall.  EXAM: PORTABLE CHEST -  1 VIEW  COMPARISON:  CHEST x-ray 10/17/2009.  FINDINGS: Lung volumes are slightly low. No consolidative airspace disease. No pleural effusions. No pneumothorax. No pulmonary nodule or mass noted. Pulmonary vasculature and the cardiomediastinal silhouette are within normal limits. Atherosclerosis in the thoracic aorta. Multiple acute left-sided rib fractures involving the lateral aspects of at least the left 5th through 7th ribs are noted.  IMPRESSION: 1. Multiple acute displaced left-sided rib fractures involving at least ribs left 5-7. No definite associated pneumothorax at this time. 2. Atherosclerosis.   Electronically Signed   By: Trudie Reed M.D.   On: 11/23/2012 14:11    Microbiology: Recent Results (from the past 240 hour(s))  MRSA PCR SCREENING     Status: Abnormal   Collection Time    11/23/12  5:34 PM      Result Value Range Status   MRSA by PCR POSITIVE (*) NEGATIVE Final   Comment:            The GeneXpert MRSA Assay (FDA     approved for NASAL specimens     only), is one component of a     comprehensive MRSA colonization     surveillance program. It is not     intended to diagnose MRSA     infection nor to guide or     monitor treatment for     MRSA infections.     RESULT CALLED TO, READ BACK BY AND VERIFIED WITHFestus Holts RN 1907 11/23/12 A BROWNING     Labs: Basic Metabolic Panel:  Recent Labs Lab 11/23/12 1350 11/23/12 1405 11/24/12 0520 11/25/12 0618  NA 136 140 136 133*  K 4.6 4.4 3.9 4.1  CL 101 105 102 99  CO2 25  --  24 21  GLUCOSE 101* 98 105* 107*  BUN 30* 31* 23 21  CREATININE 1.40* 1.70* 1.15 1.08  CALCIUM 9.2  --  8.7 8.6    Liver Function Tests: No results found for this basename: AST, ALT, ALKPHOS, BILITOT, PROT, ALBUMIN,  in the last 168 hours No results found for this basename: LIPASE, AMYLASE,  in the last 168 hours No results found for this basename: AMMONIA,  in the last 168 hours CBC:  Recent Labs Lab 11/23/12 1350 11/23/12 1405 11/24/12 0520  WBC 9.6  --  8.9  HGB 15.5 16.0 14.7  HCT 43.6 47.0 41.5  MCV 91.0  --  90.4  PLT 208  --  180   Cardiac Enzymes:  Recent Labs Lab 11/23/12 1836 11/24/12 0100 11/24/12 0520  TROPONINI <0.30 <0.30 <0.30    Principal Problem:   Syncope and collapse Active Problems:   HYPERTENSION   CAD (coronary artery disease)   Fall   Multiple fractures of ribs of left side   Right wrist fracture   TBI (traumatic brain injury)   AKI (acute kidney injury)   Splenic artery aneurysm   Time coordinating discharge: <30 mins  Signed:  Andreas Sobolewski, ANP-BC

## 2012-11-25 NOTE — Progress Notes (Signed)
Chart reviewed. Discussed with Dr. Sharon Seller.  Plans for discharge noted.  Echo, telemetry labs ok. No further medical workup.  Available as needed  Crista Curb, M.D. [facility] [phone]

## 2013-01-25 DIAGNOSIS — I219 Acute myocardial infarction, unspecified: Secondary | ICD-10-CM

## 2013-01-25 HISTORY — DX: Acute myocardial infarction, unspecified: I21.9

## 2013-02-25 HISTORY — PX: CARPAL TUNNEL RELEASE: SHX101

## 2013-05-01 ENCOUNTER — Telehealth: Payer: Self-pay | Admitting: Cardiology

## 2013-05-01 NOTE — Telephone Encounter (Signed)
Will forward to MD for review and orders

## 2013-05-01 NOTE — Telephone Encounter (Signed)
Yes. If not done at PCP please check lipid profile, ALT, BMET and CBC. Thanks.

## 2013-05-01 NOTE — Telephone Encounter (Signed)
New message          Pt wants to know if he needs a lab done before appt

## 2013-05-02 ENCOUNTER — Telehealth: Payer: Self-pay | Admitting: Cardiology

## 2013-05-02 DIAGNOSIS — E785 Hyperlipidemia, unspecified: Secondary | ICD-10-CM

## 2013-05-02 DIAGNOSIS — I10 Essential (primary) hypertension: Secondary | ICD-10-CM

## 2013-05-02 DIAGNOSIS — I2510 Atherosclerotic heart disease of native coronary artery without angina pectoris: Secondary | ICD-10-CM

## 2013-05-02 NOTE — Telephone Encounter (Signed)
Spoke with spouse, spouse aware that labs are need, patient will call back to schedule the labs. Orders placed

## 2013-05-02 NOTE — Telephone Encounter (Signed)
Lad order placed,spouse aware, patient will call back to make the appointment

## 2013-05-22 ENCOUNTER — Other Ambulatory Visit: Payer: Self-pay | Admitting: Interventional Cardiology

## 2013-06-05 ENCOUNTER — Other Ambulatory Visit (INDEPENDENT_AMBULATORY_CARE_PROVIDER_SITE_OTHER): Payer: Commercial Managed Care - HMO

## 2013-06-05 DIAGNOSIS — E785 Hyperlipidemia, unspecified: Secondary | ICD-10-CM

## 2013-06-05 DIAGNOSIS — I10 Essential (primary) hypertension: Secondary | ICD-10-CM

## 2013-06-05 DIAGNOSIS — I2510 Atherosclerotic heart disease of native coronary artery without angina pectoris: Secondary | ICD-10-CM

## 2013-06-05 LAB — CBC WITH DIFFERENTIAL/PLATELET
BASOS ABS: 0 10*3/uL (ref 0.0–0.1)
Basophils Relative: 0.9 % (ref 0.0–3.0)
EOS ABS: 0.1 10*3/uL (ref 0.0–0.7)
Eosinophils Relative: 2.7 % (ref 0.0–5.0)
HEMATOCRIT: 44.7 % (ref 39.0–52.0)
Hemoglobin: 15.1 g/dL (ref 13.0–17.0)
LYMPHS ABS: 1.4 10*3/uL (ref 0.7–4.0)
Lymphocytes Relative: 26.5 % (ref 12.0–46.0)
MCHC: 33.9 g/dL (ref 30.0–36.0)
MCV: 94.6 fl (ref 78.0–100.0)
Monocytes Absolute: 0.8 10*3/uL (ref 0.1–1.0)
Monocytes Relative: 15 % — ABNORMAL HIGH (ref 3.0–12.0)
Neutro Abs: 2.9 10*3/uL (ref 1.4–7.7)
Neutrophils Relative %: 54.9 % (ref 43.0–77.0)
PLATELETS: 230 10*3/uL (ref 150.0–400.0)
RBC: 4.72 Mil/uL (ref 4.22–5.81)
RDW: 13.3 % (ref 11.5–15.5)
WBC: 5.2 10*3/uL (ref 4.0–10.5)

## 2013-06-05 LAB — BASIC METABOLIC PANEL
BUN: 23 mg/dL (ref 6–23)
CALCIUM: 9.3 mg/dL (ref 8.4–10.5)
CO2: 26 mEq/L (ref 19–32)
Chloride: 105 mEq/L (ref 96–112)
Creatinine, Ser: 1.5 mg/dL (ref 0.4–1.5)
GFR: 51.81 mL/min — ABNORMAL LOW (ref >60.00)
GLUCOSE: 99 mg/dL (ref 70–99)
POTASSIUM: 3.9 meq/L (ref 3.5–5.1)
Sodium: 139 mEq/L (ref 135–145)

## 2013-06-05 LAB — LIPID PANEL
CHOLESTEROL: 144 mg/dL (ref 0–200)
HDL: 45.6 mg/dL (ref >39.00)
LDL Cholesterol: 68 mg/dL (ref 0–99)
Total CHOL/HDL Ratio: 3
Triglycerides: 151 mg/dL — ABNORMAL HIGH (ref 0.0–149.0)
VLDL: 30.2 mg/dL (ref 0.0–40.0)

## 2013-06-07 ENCOUNTER — Other Ambulatory Visit: Payer: Self-pay | Admitting: Internal Medicine

## 2013-06-08 ENCOUNTER — Ambulatory Visit (INDEPENDENT_AMBULATORY_CARE_PROVIDER_SITE_OTHER): Payer: Commercial Managed Care - HMO | Admitting: Cardiology

## 2013-06-08 ENCOUNTER — Encounter: Payer: Self-pay | Admitting: Cardiology

## 2013-06-08 VITALS — BP 138/82 | HR 84 | Ht 70.0 in | Wt 195.0 lb

## 2013-06-08 DIAGNOSIS — E785 Hyperlipidemia, unspecified: Secondary | ICD-10-CM

## 2013-06-08 DIAGNOSIS — R0781 Pleurodynia: Secondary | ICD-10-CM

## 2013-06-08 DIAGNOSIS — I252 Old myocardial infarction: Secondary | ICD-10-CM

## 2013-06-08 DIAGNOSIS — R079 Chest pain, unspecified: Secondary | ICD-10-CM

## 2013-06-08 DIAGNOSIS — I10 Essential (primary) hypertension: Secondary | ICD-10-CM

## 2013-06-08 DIAGNOSIS — I2510 Atherosclerotic heart disease of native coronary artery without angina pectoris: Secondary | ICD-10-CM

## 2013-06-08 NOTE — Patient Instructions (Signed)
Your physician recommends that you continue on your current medications as directed. Please refer to the Current Medication list given to you today.  Your physician wants you to follow-up in: 1 YEAR OV You will receive a reminder letter in the mail two months in advance. If you don't receive a letter, please call our office to schedule the follow-up appointment.  

## 2013-06-08 NOTE — Telephone Encounter (Signed)
rx sent to pharmacy by e-script  

## 2013-06-08 NOTE — Telephone Encounter (Signed)
Ok to fill 

## 2013-06-08 NOTE — Progress Notes (Signed)
[place] [facility], [address] [place], [place]  [address] Phone: [phone] Fax:  [phone]  Date:  06/08/2013   ID:  Randall Reeves, DOB [birth year], MRN [id number]  PCP:  Viviana Simpler, MD   History of Present Illness: Randall Reeves is a 68 y.o. male with prior non-ST elevation myocardial infarction and 2011 with catheterization showing no significant coronary artery disease here for followup.  Previously, was previously having difficulty with dizziness, saw ENT. I have changed him/discontinued HCT portion of losartan in the past because I was concerned about orthostatic hypotension. He will feel this when bending over and picking up a heavy object. Lawnmower for instance. When he feels dizzy, his blood pressures are usually in the low 100s, for instance.  Had a fall and has some low back pain. 20 foot ladder with chainsaw. 2 days in hospital. 11/23/12.     Wt Readings from Last 3 Encounters:  06/08/13 195 lb (88.451 kg)  11/24/12 187 lb 2.7 oz (84.9 kg)  10/30/12 192 lb (87.091 kg)     Past Medical History  Diagnosis Date  . Allergy   . GERD (gastroesophageal reflux disease)   . Hypertension   . Hyperlipidemia   . Obstructive sleep apnea   . Coronary atherosclerosis of unspecified type of vessel, native or graft 2011    MI but cath benign. Dr Otis Brace    Past Surgical History  Procedure Laterality Date  . Spermatocele repair      Current Outpatient Prescriptions  Medication Sig Dispense Refill  . aspirin 81 MG chewable tablet Chew 81 mg by mouth daily.      Marland Kitchen atorvastatin (LIPITOR) 20 MG tablet take 1/2 tablet by mouth once daily  15 tablet  12  . CIALIS 5 MG tablet take 1 tablet by mouth daily or as directed by prescriber  30 tablet  11  . HYDROcodone-acetaminophen (NORCO/VICODIN) 5-325 MG per tablet Take 0.5-2 tablets by mouth every 4 (four) hours as needed.  30 tablet  0  . losartan (COZAAR) 50 MG tablet take 1 tablet by mouth once daily  30 tablet  1  .  meclizine (ANTIVERT) 25 MG tablet Take 25 mg by mouth daily as needed for dizziness.      . Multiple Vitamin (MULTIVITAMIN) tablet Take 1 tablet by mouth daily.       Marland Kitchen omeprazole (PRILOSEC) 20 MG capsule Take 20 mg by mouth daily.         No current facility-administered medications for this visit.    Allergies:    Allergies  Allergen Reactions  . Bisoprolol-Hydrochlorothiazide     REACTION: muscle pain, cramps  . Codeine Nausea Only    Social History:  The patient  reports that he has never smoked. He has never used smokeless tobacco. He reports that he drinks alcohol. He reports that he does not use illicit drugs.   Family History  Problem Relation Age of Onset  . Stroke Mother   . Hypertension Mother   . Diabetes Father   . Asthma Father   . Heart failure Father   . Hypertension Sister   . Neuropathy Brother   . Hypertension Brother   . Hypertension Brother   . Hypertension Brother   . Hypertension Brother   . Cancer Brother     prostate    ROS:  Please see the history of present illness.   Denies any fevers, chills, orthopnea, PND, bleeding. After broken ribs from fall  off of ladder, some residual rib pain.   All other systems reviewed and negative.   PHYSICAL EXAM: VS:  BP 138/82  Pulse 84  Ht 5\' 10"  (1.778 m)  Wt 195 lb (88.451 kg)  BMI 27.98 kg/m2 Well nourished, well developed, in no acute distress HEENT: normal, [place]/AT, EOMI Neck: no JVD, normal carotid upstroke, no bruit Cardiac:  normal S1, S2; RRR; no murmur Lungs:  clear to auscultation bilaterally, no wheezing, rhonchi or rales Abd: soft, nontender, no hepatomegaly, no bruits Ext: no edema, 2+ distal pulses Skin: warm and dry GU: deferred Neuro: no focal abnormalities noted, AAO x 3  EKG:  11/23/12-sinus rhythm, no other significant abnormalities.    ECHO: 11/24/12 - normal EF, grade 1 diastolic dysfunction  ASSESSMENT AND PLAN:  1. Old MI-doing well, no anginal symptoms. Secondary  prevention. 2. Hyperlipidemia-continue with low-dose atorvastatin. I would like for him to continue with this for secondary prevention reasons. 3. Rib pain-residual from fractures. 4. Hypertension-reasonably controlled. Angiotensin receptor blocker. In the past, HCT portion had been discontinued because of dizziness. He is no longer dizzy. 5. Mildly elevated creatinine-1.5. Told him to avoid NSAIDs. 6. Annual followup  Signed, Candee Furbish, MD [facility]  06/08/2013 8:35 AM

## 2013-06-08 NOTE — Telephone Encounter (Signed)
Okay #30 x 11

## 2013-06-12 ENCOUNTER — Ambulatory Visit: Payer: BC Managed Care – PPO | Admitting: Cardiology

## 2013-06-14 ENCOUNTER — Ambulatory Visit: Payer: Commercial Managed Care - HMO | Admitting: Cardiology

## 2013-08-06 ENCOUNTER — Other Ambulatory Visit: Payer: Self-pay | Admitting: Cardiology

## 2013-11-06 ENCOUNTER — Ambulatory Visit: Payer: Commercial Managed Care - HMO

## 2013-11-09 ENCOUNTER — Other Ambulatory Visit: Payer: Self-pay

## 2013-12-05 ENCOUNTER — Ambulatory Visit (INDEPENDENT_AMBULATORY_CARE_PROVIDER_SITE_OTHER): Payer: Commercial Managed Care - HMO | Admitting: Internal Medicine

## 2013-12-05 ENCOUNTER — Encounter: Payer: Self-pay | Admitting: Internal Medicine

## 2013-12-05 VITALS — BP 140/80 | HR 82 | Temp 97.5°F | Wt 193.0 lb

## 2013-12-05 DIAGNOSIS — L309 Dermatitis, unspecified: Secondary | ICD-10-CM | POA: Insufficient documentation

## 2013-12-05 DIAGNOSIS — I728 Aneurysm of other specified arteries: Secondary | ICD-10-CM

## 2013-12-05 MED ORDER — TADALAFIL 5 MG PO TABS: 5.0000 mg | ORAL_TABLET | Freq: Every day | ORAL | Status: DC

## 2013-12-05 MED ORDER — TRIAMCINOLONE ACETONIDE 0.1 % EX CREA: 1.0000 "application " | TOPICAL_CREAM | Freq: Two times a day (BID) | CUTANEOUS | Status: DC | PRN

## 2013-12-05 NOTE — Assessment & Plan Note (Signed)
No drainage or evidence of issue with retained suture, etc Will try triamcinolone cream

## 2013-12-05 NOTE — Assessment & Plan Note (Signed)
Will set up with vascular surgeon This seems small but would like assessment to see if regular surveillance is appropriate

## 2013-12-05 NOTE — Progress Notes (Signed)
Pre visit review using our clinic review tool, if applicable. No additional management support is needed unless otherwise documented below in the visit note. 

## 2013-12-05 NOTE — Progress Notes (Signed)
   Subjective:    Patient ID: Randall Reeves, male    DOB: 10/18/1945, 68 y.o.   MRN: [id number]  HPI Reviewed his history of fall and trauma last year He had a splenic aneurysm found then Told he needs referral for vascular evaluation  Had CTS surgery on right wrist (that was broken in the fall)--Dr [facility] Since the surgery, he noticed tiny rash at center of the incision Seems to be bigger now More "intense" now---reminds him of a concrete scraping burn No pain No drainage  Current Outpatient Prescriptions on File Prior to Visit  Medication Sig Dispense Refill  . aspirin 81 MG chewable tablet Chew 81 mg by mouth daily.    Marland Kitchen CIALIS 5 MG tablet take 1 tablet by mouth daily or as directed by prescriber 30 tablet 11  . losartan (COZAAR) 50 MG tablet take 1 tablet by mouth once daily 30 tablet 5  . Multiple Vitamin (MULTIVITAMIN) tablet Take 1 tablet by mouth daily.     Marland Kitchen omeprazole (PRILOSEC) 20 MG capsule Take 20 mg by mouth daily.       No current facility-administered medications on file prior to visit.    Allergies  Allergen Reactions  . Bisoprolol-Hydrochlorothiazide     REACTION: muscle pain, cramps  . Codeine Nausea Only    Past Medical History  Diagnosis Date  . Allergy   . GERD (gastroesophageal reflux disease)   . Hypertension   . Hyperlipidemia   . Obstructive sleep apnea   . Coronary atherosclerosis of unspecified type of vessel, native or graft 2011    MI but cath benign. Dr Otis Brace    Past Surgical History  Procedure Laterality Date  . Spermatocele repair      Family History  Problem Relation Age of Onset  . Stroke Mother   . Hypertension Mother   . Diabetes Father   . Asthma Father   . Heart failure Father   . Hypertension Sister   . Neuropathy Brother   . Hypertension Brother   . Hypertension Brother   . Hypertension Brother   . Hypertension Brother   . Cancer Brother     prostate    History   Social History  . Marital Status: Married   Spouse Name: N/A    Number of Children: 2  . Years of Education: N/A   Occupational History  . landscaping    Social History Main Topics  . Smoking status: Never Smoker   . Smokeless tobacco: Never Used  . Alcohol Use: Yes     Comment: a beer a day  . Drug Use: No  . Sexual Activity: Not on file   Other Topics Concern  . Not on file   Social History Narrative   Has living will   Requests wife as health care POA.   Would accept resuscitation attempts.   Would leave feeding tube decision to his wife   Review of Systems Feels well otherwise No other rash    Objective:   Physical Exam  Constitutional: He appears well-developed and well-nourished. No distress.  Skin:  Eczematous type rash over incision on flexor right wrist          Assessment & Plan:

## 2014-01-04 ENCOUNTER — Telehealth: Payer: Self-pay | Admitting: Internal Medicine

## 2014-01-04 NOTE — Telephone Encounter (Signed)
LVM to see if pt wants to r/s CPE to 12/15 @ 10:30am

## 2014-01-08 ENCOUNTER — Encounter: Payer: Self-pay | Admitting: Internal Medicine

## 2014-01-08 ENCOUNTER — Ambulatory Visit (INDEPENDENT_AMBULATORY_CARE_PROVIDER_SITE_OTHER): Payer: Commercial Managed Care - HMO | Admitting: Internal Medicine

## 2014-01-08 VITALS — BP 130/80 | HR 87 | Temp 98.1°F | Ht 69.0 in | Wt 197.0 lb

## 2014-01-08 DIAGNOSIS — R200 Anesthesia of skin: Secondary | ICD-10-CM | POA: Insufficient documentation

## 2014-01-08 DIAGNOSIS — K219 Gastro-esophageal reflux disease without esophagitis: Secondary | ICD-10-CM

## 2014-01-08 DIAGNOSIS — Z7189 Other specified counseling: Secondary | ICD-10-CM

## 2014-01-08 DIAGNOSIS — I2510 Atherosclerotic heart disease of native coronary artery without angina pectoris: Secondary | ICD-10-CM

## 2014-01-08 DIAGNOSIS — R208 Other disturbances of skin sensation: Secondary | ICD-10-CM

## 2014-01-08 DIAGNOSIS — Z23 Encounter for immunization: Secondary | ICD-10-CM

## 2014-01-08 DIAGNOSIS — N138 Other obstructive and reflux uropathy: Secondary | ICD-10-CM | POA: Insufficient documentation

## 2014-01-08 DIAGNOSIS — N400 Enlarged prostate without lower urinary tract symptoms: Secondary | ICD-10-CM

## 2014-01-08 DIAGNOSIS — I10 Essential (primary) hypertension: Secondary | ICD-10-CM

## 2014-01-08 DIAGNOSIS — Z0000 Encounter for general adult medical examination without abnormal findings: Secondary | ICD-10-CM

## 2014-01-08 DIAGNOSIS — E785 Hyperlipidemia, unspecified: Secondary | ICD-10-CM

## 2014-01-08 DIAGNOSIS — N401 Enlarged prostate with lower urinary tract symptoms: Secondary | ICD-10-CM

## 2014-01-08 LAB — RENAL FUNCTION PANEL
Albumin: 4.1 g/dL (ref 3.5–5.2)
BUN: 21 mg/dL (ref 6–23)
CO2: 26 mEq/L (ref 19–32)
Calcium: 9.4 mg/dL (ref 8.4–10.5)
Chloride: 104 mEq/L (ref 96–112)
Creatinine, Ser: 1.3 mg/dL (ref 0.4–1.5)
GFR: 58.66 mL/min — AB (ref >60.00)
GLUCOSE: 99 mg/dL (ref 70–99)
PHOSPHORUS: 3.5 mg/dL (ref 2.3–4.6)
Potassium: 4.4 mEq/L (ref 3.5–5.1)
Sodium: 135 mEq/L (ref 135–145)

## 2014-01-08 LAB — LIPID PANEL
CHOL/HDL RATIO: 5
Cholesterol: 204 mg/dL — ABNORMAL HIGH (ref 0–200)
HDL: 37.5 mg/dL — AB (ref >39.00)
NONHDL: 166.5
Triglycerides: 292 mg/dL — ABNORMAL HIGH (ref 0.0–149.0)
VLDL: 58.4 mg/dL — AB (ref 0.0–40.0)

## 2014-01-08 LAB — T4, FREE: FREE T4: 0.77 ng/dL (ref 0.60–1.60)

## 2014-01-08 LAB — VITAMIN B12: Vitamin B-12: 555 pg/mL (ref 211–911)

## 2014-01-08 MED ORDER — TAMSULOSIN HCL 0.4 MG PO CAPS: 0.4000 mg | ORAL_CAPSULE | Freq: Every day | ORAL | Status: DC

## 2014-01-08 NOTE — Assessment & Plan Note (Signed)
Discussed this If LDL back over 100, will restart atorvastatin 10 or 20

## 2014-01-08 NOTE — Progress Notes (Signed)
Subjective:    Patient ID: Randall Reeves, male    DOB: 1945/03/21, 68 y.o.   MRN: [id number]  HPI Here for Medicare wellness visit and follow up of CAD and other chronic medical conditions Reviewed advanced directives Sees Dr Otis Brace and due to see vascular surgeon later this week. Dentist Dr Jacelyn Grip, eye doctor --[facility] No tobacco Drinks about 1 beer a day Tries to exercise at times---mostly walking 3-4 miles at a time. 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 Vision and hearing are okay--just mild hearing issues Independent in all instrumental ADLs No procedures or hospitalizations in past year No sig cognitive changes No falls in past year---balance not as good No depression or anhedonia  Keeps up with cardiologist No chest pain No SOB or change or exercise tolerance Not really dizzy but aware of his balance--no syncope No edema  He decided to go off the statin He was concerned about possible side effects--though he wasn't having any Discussed---if LDL over 100, he will restart  No problems with heartburn--even eating [place] No dysphagia Continues on the omeprazole  He has noticed some numbness in his feet Worried---brother has neuropathy  Current Outpatient Prescriptions on File Prior to Visit  Medication Sig Dispense Refill  . aspirin 81 MG chewable tablet Chew 81 mg by mouth daily.    Marland Kitchen losartan (COZAAR) 50 MG tablet take 1 tablet by mouth once daily 30 tablet 5  . Multiple Vitamin (MULTIVITAMIN) tablet Take 1 tablet by mouth daily.     Marland Kitchen omeprazole (PRILOSEC) 20 MG capsule Take 20 mg by mouth daily.      . tadalafil (CIALIS) 5 MG tablet Take 1 tablet (5 mg total) by mouth daily. 30 tablet 11  . triamcinolone cream (KENALOG) 0.1 % Apply 1 application topically 2 (two) times daily as needed. 30 g 2   No current facility-administered medications on file prior to visit.    Allergies  Allergen Reactions  .  Bisoprolol-Hydrochlorothiazide     REACTION: muscle pain, cramps  . Codeine Nausea Only    Past Medical History  Diagnosis Date  . Allergy   . GERD (gastroesophageal reflux disease)   . Hypertension   . Hyperlipidemia   . Obstructive sleep apnea   . Coronary atherosclerosis of unspecified type of vessel, native or graft 2011    MI but cath benign. Dr Otis Brace    Past Surgical History  Procedure Laterality Date  . Spermatocele repair      Family History  Problem Relation Age of Onset  . Stroke Mother   . Hypertension Mother   . Diabetes Father   . Asthma Father   . Heart failure Father   . Hypertension Sister   . Neuropathy Brother   . Hypertension Brother   . Hypertension Brother   . Hypertension Brother   . Hypertension Brother   . Cancer Brother     prostate    History   Social History  . Marital Status: Married    Spouse Name: N/A    Number of Children: 2  . Years of Education: N/A   Occupational History  . landscaping    Social History Main Topics  . Smoking status: Never Smoker   . Smokeless tobacco: Never Used  . Alcohol Use: Yes     Comment: a beer a day  . Drug Use: No  . Sexual Activity: Not on file   Other Topics Concern  . Not on file  Social History Narrative   Has living will   Wife is his health care POA.   Would accept resuscitation attempts.   Would leave feeding tube decision to his wife   Review of Systems Appetite is fine Weight is up a few pounds Uses the cialis prn--- has helped in past Voids okay--some increased frequency. Nocturia is variable-- 1-3 times Sleeps well in general Some stiffness in joints--not as flexible--but no major pain issues Rare vertigo    Objective:   Physical Exam  Constitutional: He is oriented to person, place, and time. He appears well-developed and well-nourished. No distress.  HENT:  Mouth/Throat: Oropharynx is clear and moist.  Eyes: Conjunctivae and EOM are normal. Pupils are equal,  round, and reactive to light.  Neck: Normal range of motion. Neck supple. No thyromegaly present.  Cardiovascular: Normal rate, regular rhythm, normal heart sounds and intact distal pulses.  Exam reveals no gallop.   No murmur heard. Musculoskeletal: He exhibits no edema or tenderness.  Lymphadenopathy:    He has no cervical adenopathy.  Neurological: He is alert and oriented to person, place, and time.  President-- "Elyn Peers, [place]" [phone] [id number] Recall 2/3  Skin: No rash noted. No erythema.  Psychiatric: He has a normal mood and affect. His behavior is normal.          Assessment & Plan:

## 2014-01-08 NOTE — Progress Notes (Signed)
Pre visit review using our clinic review tool, if applicable. No additional management support is needed unless otherwise documented below in the visit note. 

## 2014-01-08 NOTE — Assessment & Plan Note (Signed)
Variable but bothers him more Will try tamsulosin

## 2014-01-08 NOTE — Assessment & Plan Note (Signed)
I have personally reviewed the Medicare Annual Wellness questionnaire and have noted 1. The patient's medical and social history 2. Their use of alcohol, tobacco or illicit drugs 3. Their current medications and supplements 4. The patient's functional ability including ADL's, fall risks, home safety risks and hearing or visual             impairment. 5. Diet and physical activities 6. Evidence for depression or mood disorders  The patients weight, height, BMI and visual acuity have been recorded in the chart I have made referrals, counseling and provided education to the patient based review of the above and I have provided the pt with a written personalized care plan for preventive services.  I have provided you with a copy of your personalized plan for preventive services. Please take the time to review along with your updated medication list.  Doing well Due for prevnar Defer PSA till at least next year Colonoscopy not due till 2019

## 2014-01-08 NOTE — Addendum Note (Signed)
Addended by: Despina Hidden on: 01/08/2014 02:33 PM   Modules accepted: Orders

## 2014-01-08 NOTE — Assessment & Plan Note (Signed)
[place] have idiopathic neuropathy Will check labs

## 2014-01-08 NOTE — Assessment & Plan Note (Signed)
See social history 

## 2014-01-08 NOTE — Assessment & Plan Note (Signed)
With negative cath No recent symptoms Continues with the cardiologist

## 2014-01-08 NOTE — Assessment & Plan Note (Signed)
Quiet on the PPI 

## 2014-01-08 NOTE — Assessment & Plan Note (Signed)
BP Readings from Last 3 Encounters:  01/08/14 130/80  12/05/13 140/80  06/08/13 138/82   Good control on losartan Will recheck renal function

## 2014-01-09 ENCOUNTER — Telehealth: Payer: Self-pay | Admitting: Internal Medicine

## 2014-01-09 ENCOUNTER — Encounter: Payer: Self-pay | Admitting: Vascular Surgery

## 2014-01-09 LAB — LDL CHOLESTEROL, DIRECT: Direct LDL: 115.2 mg/dL

## 2014-01-09 NOTE — Telephone Encounter (Signed)
emmi emailed °

## 2014-01-10 ENCOUNTER — Encounter: Payer: Self-pay | Admitting: Vascular Surgery

## 2014-01-10 ENCOUNTER — Telehealth: Payer: Self-pay | Admitting: Vascular Surgery

## 2014-01-10 ENCOUNTER — Ambulatory Visit (INDEPENDENT_AMBULATORY_CARE_PROVIDER_SITE_OTHER): Payer: Commercial Managed Care - HMO | Admitting: Vascular Surgery

## 2014-01-10 ENCOUNTER — Other Ambulatory Visit: Payer: Self-pay | Admitting: Vascular Surgery

## 2014-01-10 VITALS — BP 149/94 | HR 87 | Ht 69.0 in | Wt 198.0 lb

## 2014-01-10 DIAGNOSIS — I728 Aneurysm of other specified arteries: Secondary | ICD-10-CM

## 2014-01-10 DIAGNOSIS — Z01818 Encounter for other preprocedural examination: Secondary | ICD-10-CM

## 2014-01-10 LAB — CREATININE, SERUM: Creat: 1.38 mg/dL — ABNORMAL HIGH (ref 0.50–1.35)

## 2014-01-10 LAB — BUN: BUN: 22 mg/dL (ref 6–23)

## 2014-01-10 NOTE — Progress Notes (Signed)
[facility] [place] HISTORY AND PHYSICAL   History of Present Illness:  Patient is a 68 y.o. year old male who presents for evaluation of an incidental splenic artery aneurysm found on a CT scan in October 2014 after a trauma. The patient denies any abdominal or back pain. He has no family history of aneurysms. He denies any prior history of pancreatitis.  Other medical problems include reflux hypertension hyperlipidemia sleep apnea coronary artery disease all of which are currently stable.  Past Medical History  Diagnosis Date  . Allergy   . GERD (gastroesophageal reflux disease)   . Hypertension   . Hyperlipidemia   . Obstructive sleep apnea   . Coronary atherosclerosis of unspecified type of vessel, native or graft 2011    MI but cath benign. Dr Otis Brace    Past Surgical History  Procedure Laterality Date  . Spermatocele repair    . Cardiac catheterization    . Carpal tunnel release      Social History History  Substance Use Topics  . Smoking status: Never Smoker   . Smokeless tobacco: Never Used  . Alcohol Use: 1.8 - 3.0 oz/week    3-5 Cans of beer per week     Comment: a beer a day    Family History Family History  Problem Relation Age of Onset  . Stroke Mother   . Hypertension Mother   . Diabetes Father   . Asthma Father   . Heart failure Father   . Heart disease Father   . Hypertension Sister   . Diabetes Sister   . Neuropathy Brother   . Hypertension Brother   . Cancer Brother   . Diabetes Brother   . Heart disease Brother   . Hypertension Brother   . Hypertension Brother   . Hypertension Brother   . Cancer Brother     prostate    Allergies  Allergies  Allergen Reactions  . Bisoprolol-Hydrochlorothiazide     REACTION: muscle pain, cramps  . Codeine Nausea Only     Current Outpatient Prescriptions  Medication Sig Dispense Refill  . aspirin 81 MG chewable tablet Chew 81 mg by mouth daily.    Marland Kitchen losartan (COZAAR) 50 MG  tablet take 1 tablet by mouth once daily 30 tablet 5  . Multiple Vitamin (MULTIVITAMIN) tablet Take 1 tablet by mouth daily.     Marland Kitchen omeprazole (PRILOSEC) 20 MG capsule Take 20 mg by mouth daily.      . tadalafil (CIALIS) 5 MG tablet Take 1 tablet (5 mg total) by mouth daily. 30 tablet 11  . tamsulosin (FLOMAX) 0.4 MG CAPS capsule Take 1 capsule (0.4 mg total) by mouth daily. 30 capsule 11  . triamcinolone cream (KENALOG) 0.1 % Apply 1 application topically 2 (two) times daily as needed. 30 g 2   No current facility-administered medications for this visit.    ROS:   General:  No weight loss, Fever, chills  HEENT: No recent headaches, no nasal bleeding, no visual changes, no sore throat  Neurologic: No dizziness, blackouts, seizures. No recent symptoms of stroke or mini- stroke. No recent episodes of slurred speech, or temporary blindness, he does complain of bilateral numbness in his feet.  Cardiac: No recent episodes of chest pain/pressure, no shortness of breath at rest.  No shortness of breath with exertion.  Denies history of atrial fibrillation or irregular heartbeat  Vascular: No history of rest pain in feet.  No history of claudication.  No history of  non-healing ulcer, No history of DVT   Pulmonary: No home oxygen, no productive cough, no hemoptysis,  No asthma or wheezing  Musculoskeletal:  [ ]  Arthritis, [ ]  Low back pain,  [ ]  Joint pain  Hematologic:No history of hypercoagulable state.  No history of easy bleeding.  No history of anemia  Gastrointestinal: No hematochezia or melena,  No gastroesophageal reflux, no trouble swallowing  Urinary: [ ]  chronic Kidney disease, [ ]  on HD - [ ]  MWF or [ ]  TTHS, [ ]  Burning with urination, [ ]  Frequent urination, [ ]  Difficulty urinating;   Skin: No rashes  Psychological: No history of anxiety,  No history of depression   Physical Examination  Filed Vitals:   01/10/14 1010  BP: 149/94  Pulse: 87  Height: 5\' 9"  (1.753 m)   Weight: 198 lb (89.812 kg)  SpO2: 95%    Body mass index is 29.23 kg/(m^2).  General:  Alert and oriented, no acute distress HEENT: Normal Neck: No bruit or JVD Pulmonary: Clear to auscultation bilaterally Cardiac: Regular Rate and Rhythm without murmur Abdomen: Soft, non-tender, non-distended, no mass, no scars Skin: No rash Extremity Pulses:  2+ radial, brachial, femoral, dorsalis pedis pulses bilaterally Musculoskeletal: No deformity or edema  Neurologic: Upper and lower extremity motor 5/5 and symmetric  DATA:  CT scan of abdomen and pelvis dated October 2014 is reviewed today. This shows an 8 mm calcified splenic artery aneurysm adjacent to the hilum.   ASSESSMENT:  Patient with known 8 mm splenic artery aneurysm   PLAN:  We will schedule him for a repeat CT angiogram to further evaluate his splenic artery aneurysm to make sure there is been no interval change. I will call him with the results of this at the CT scan. He will most likely need yearly follow-up.  We would consider repair of the aneurysm if he reaches greater than 2 cm in diameter. Signs and symptoms of rupture were explained to the patient today. He was informed to go to the emergency room if he has severe onset of abdominal pain and informed them that he has a splenic artery aneurysm  Ruta Hinds, MD [facility] [place]: [phone] Pager: [phone]

## 2014-01-11 NOTE — Telephone Encounter (Signed)
scheduled cta, gave patient info, dr. fields to call with results

## 2014-01-16 ENCOUNTER — Other Ambulatory Visit: Payer: Self-pay | Admitting: *Deleted

## 2014-01-16 MED ORDER — ATORVASTATIN CALCIUM 10 MG PO TABS: 10.0000 mg | ORAL_TABLET | Freq: Every day | ORAL | Status: DC

## 2014-01-23 ENCOUNTER — Encounter: Payer: Commercial Managed Care - HMO | Admitting: Internal Medicine

## 2014-01-31 ENCOUNTER — Ambulatory Visit
Admission: RE | Admit: 2014-01-31 | Discharge: 2014-01-31 | Disposition: A | Discharge status: Home / Self Care | Payer: PPO | Source: Ambulatory Visit | Attending: Vascular Surgery | Admitting: Vascular Surgery

## 2014-01-31 DIAGNOSIS — Z01818 Encounter for other preprocedural examination: Secondary | ICD-10-CM

## 2014-01-31 DIAGNOSIS — I728 Aneurysm of other specified arteries: Secondary | ICD-10-CM

## 2014-01-31 MED ORDER — IOHEXOL 350 MG/ML SOLN
75.0000 mL | Freq: Once | INTRAVENOUS | Status: AC | PRN
  Administered 2014-01-31: 75 mL via INTRAVENOUS

## 2014-02-08 ENCOUNTER — Other Ambulatory Visit: Payer: Self-pay | Admitting: Cardiology

## 2014-03-19 IMAGING — CT CT CHEST W/ CM
2 of 4 series · 15 of 36 positions shown, 18 images · IV contrast (omnipaque)
Comparison: Plain films of earlier today.

CLINICAL DATA: Fall. Coronary artery atherosclerosis. Hypertension.
Left-sided shoulder pain.

EXAM:
CT CHEST WITH CONTRAST
TECHNIQUE: Multidetector CT imaging of the chest was performed during
intravenous contrast administration.
CONTRAST:  80mL OMNIPAQUE IOHEXOL 300 MG/ML  SOLN

[Series 2: thorax 5.0 i31f 1 · axial · 0.80mm/px · z∈[-487,-247]mm · 12 of 57 slices shown, 15 images]
[im 5/57  mediastinal]
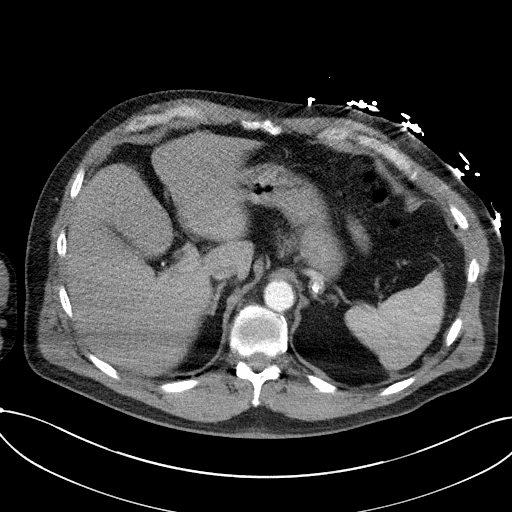
[im 5/57  lung]
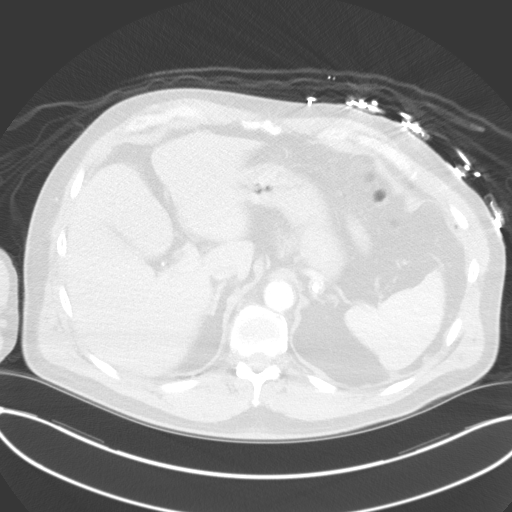
[im 9/57  lung]
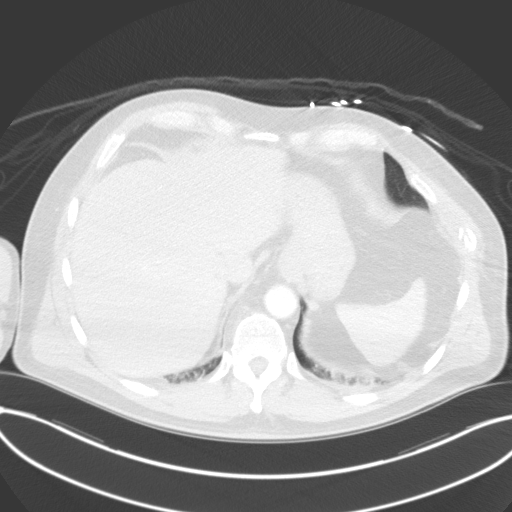
[im 13/57  lung]
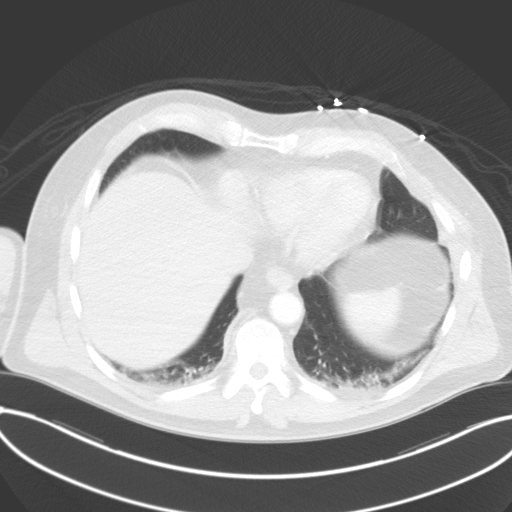
[im 17/57  lung]
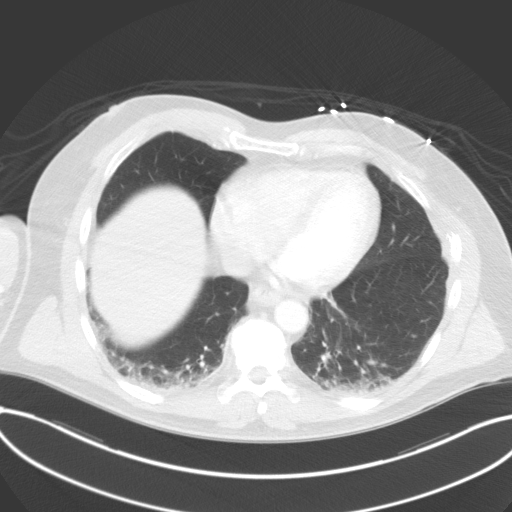
[im 21/57  mediastinal]
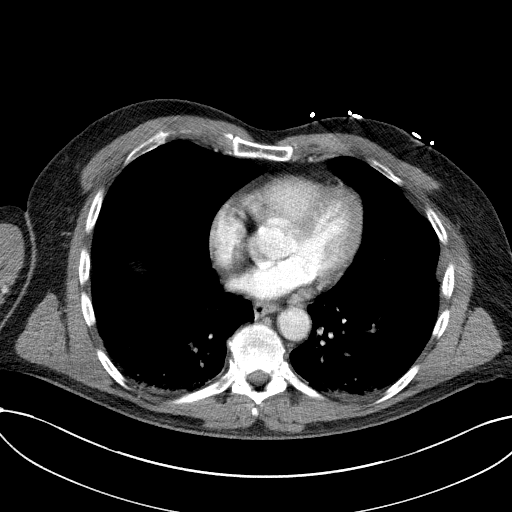
[im 21/57  lung]
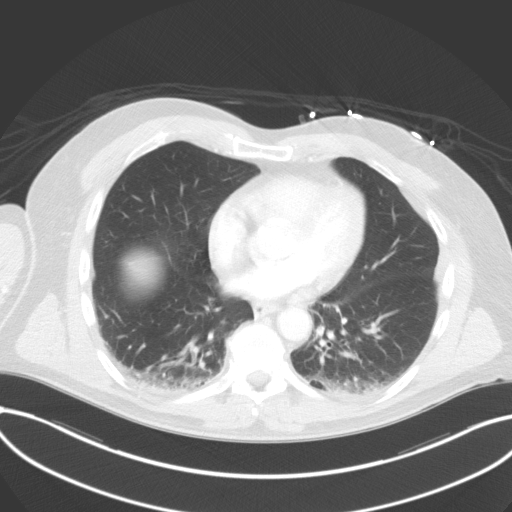
[im 25/57  lung]
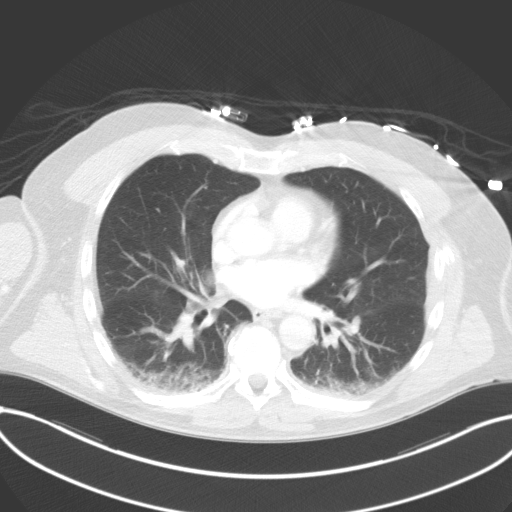
[im 33/57  lung]
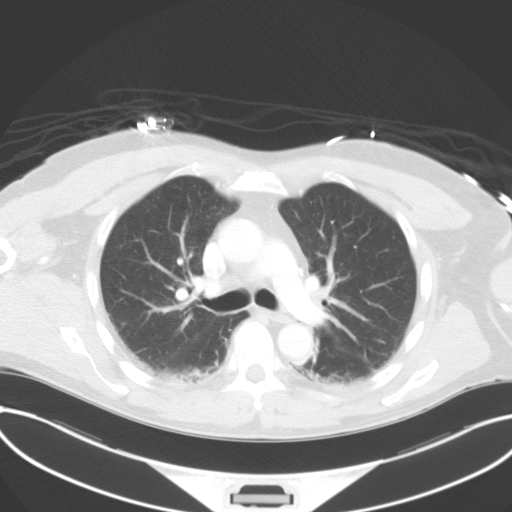
[im 37/57  lung]
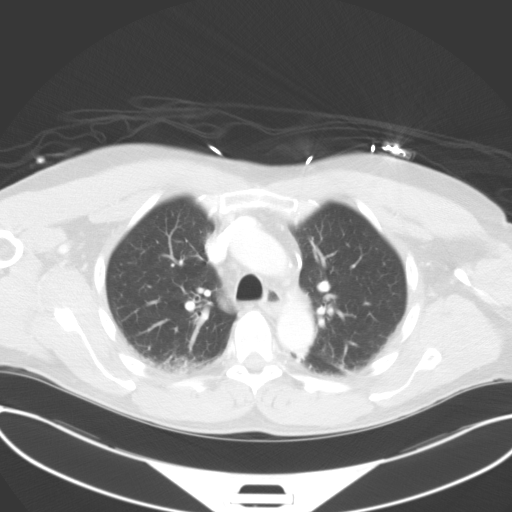
[im 41/57  mediastinal]
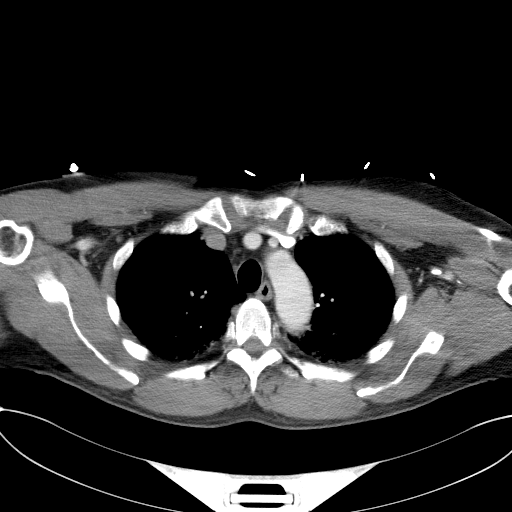
[im 41/57  lung]
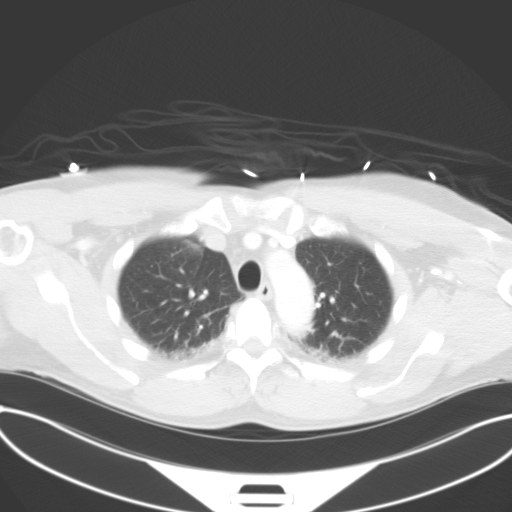
[im 45/57  lung]
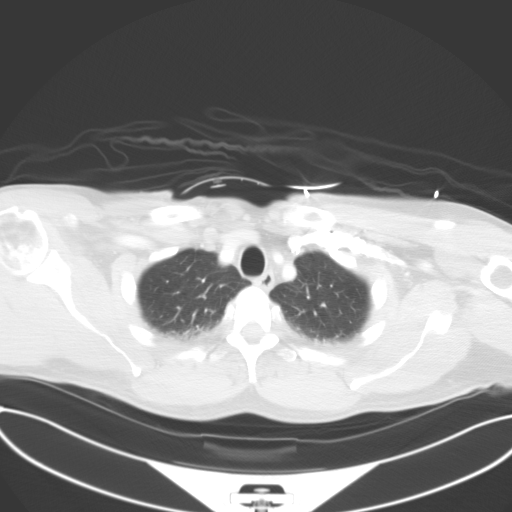
[im 49/57  lung]
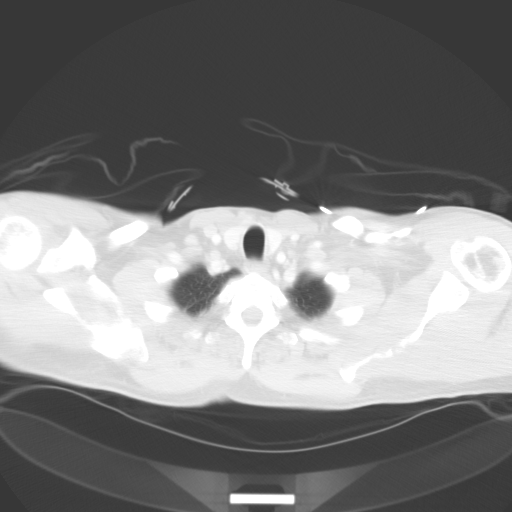
[im 53/57  lung]
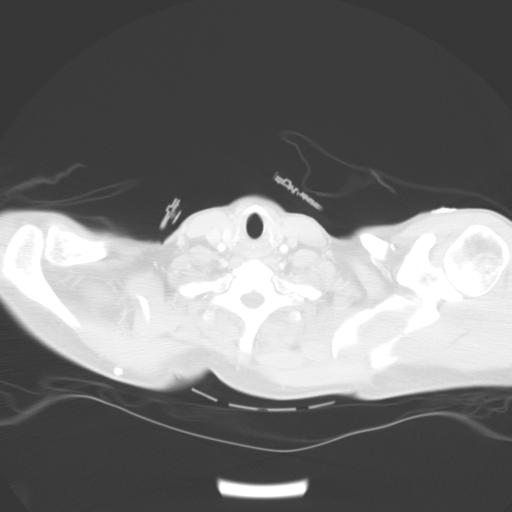

[Series 5: coronal · coronal · 0.74mm/px · 3 of 101 slices shown]
[im 21/101  lung]
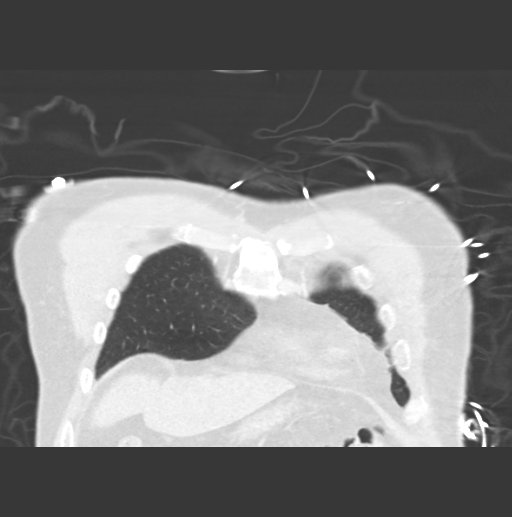
[im 41/101  lung]
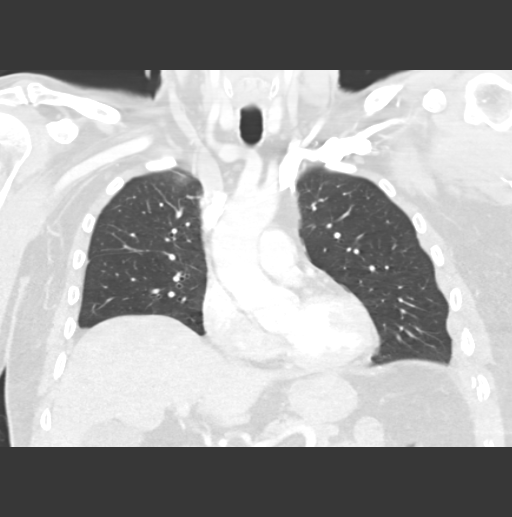
[im 61/101  lung]
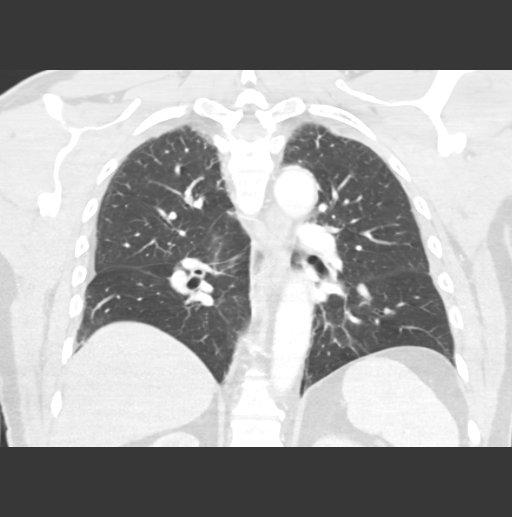

[15 of 36 positions shown; findings below may reference images not displayed]

FINDINGS: Lungs/Pleura: Minimal linear atelectasis or scarring in the right
upper lobe on image 19/ series 3. 3 mm probable subpleural lymph
node along the right minor fissure on image 28/series 3. Dependent
subsegmental atelectasis within both lung bases. No pneumothorax.
Minimal left-sided pleural thickening adjacent to fractures, likely
due to trace hemothorax.

Heart/Mediastinum: Aortic atherosclerosis. Normal heart size,
without pericardial effusion. Normal appearance of the thoracic
aorta, without evidence of mediastinal hematoma.

No mediastinal or hilar adenopathy.

Upper Abdomen: Degradation secondary to overlying wire air and lead
artifact. Suspect a splenic artery aneurysm of 8 mm on image 53/
series 2. No upper abdominal free intraperitoneal air.

Bones/Musculoskeletal: Multiple left-sided rib fractures laterally.
Minimally displaced 4th anterior. Displaced 5th and 6th lateral.
Mildly comminuted lateral 7th rib fracture. Incompletely imaged
minimally displaced left 8th rib fracture. Overlying soft tissue
hematoma.
IMPRESSION: 1. Multiple left-sided rib fractures with overlying soft tissue
swelling and minimal adjacent left hemothorax.
2. No other posttraumatic deformity identified.
3. Suspicion of an 8 mm splenic artery aneurysm.

## 2014-03-20 IMAGING — CT CT HEAD W/O CM
1 series · 16 of 30 positions shown, 20 images · non-contrast
Comparison: Prior CT from 11/23/2012

CLINICAL DATA: Followup intracranial hemorrhage

EXAM:
CT HEAD WITHOUT CONTRAST
TECHNIQUE: Contiguous axial images were obtained from the base of the skull
through the vertex without intravenous contrast.

[Series 2: head 5.0 h30s · axial · 0.47mm/px · z∈[-154,-9]mm · 16 of 33 slices shown, 20 images]
[im 2/33  brain]
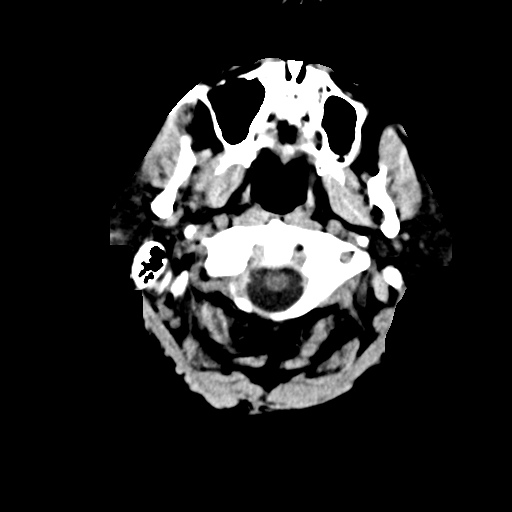
[im 2/33  bone]
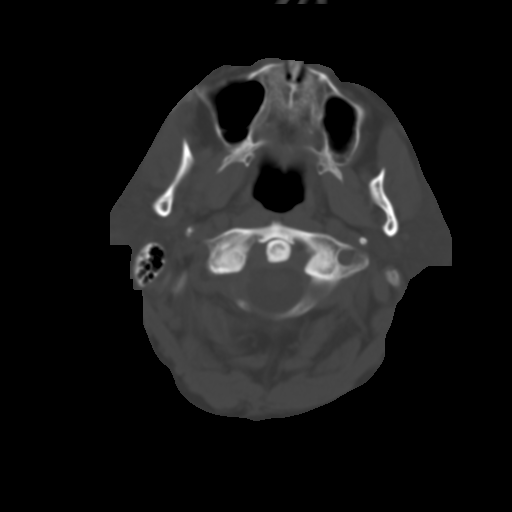
[im 4/33  brain]
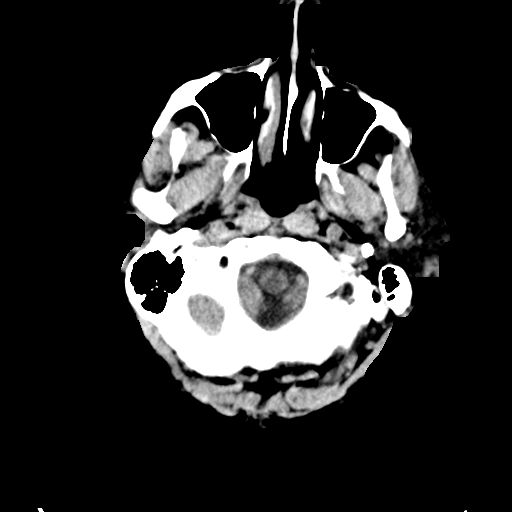
[im 6/33  brain]
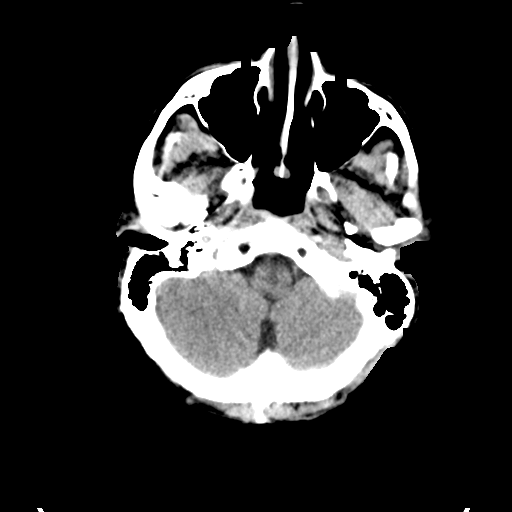
[im 8/33  brain]
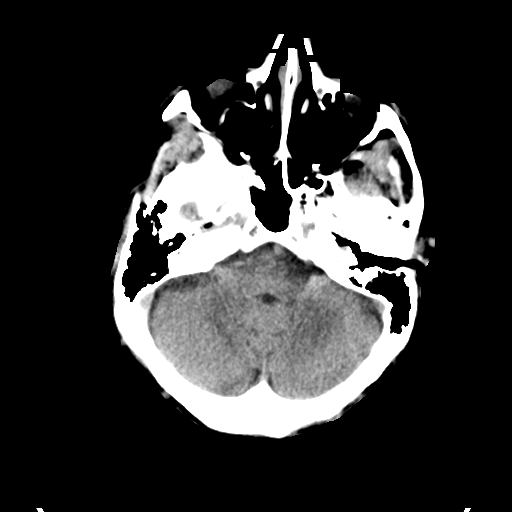
[im 9/33  brain]
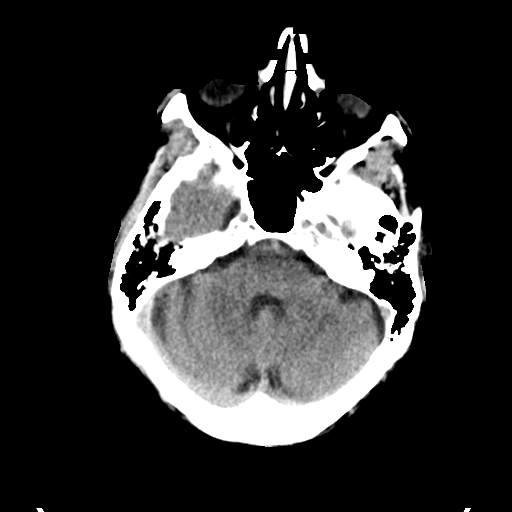
[im 9/33  bone]
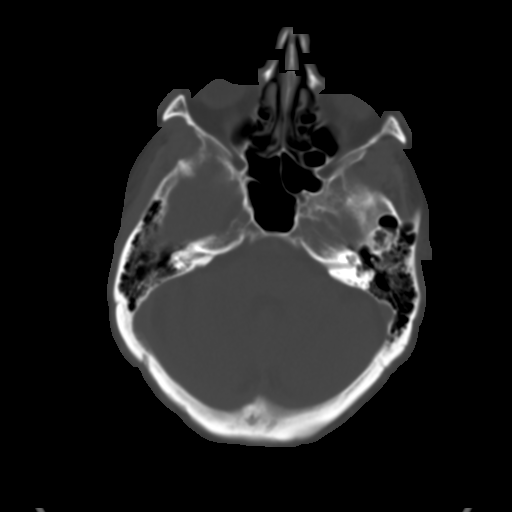
[im 12/33  brain]
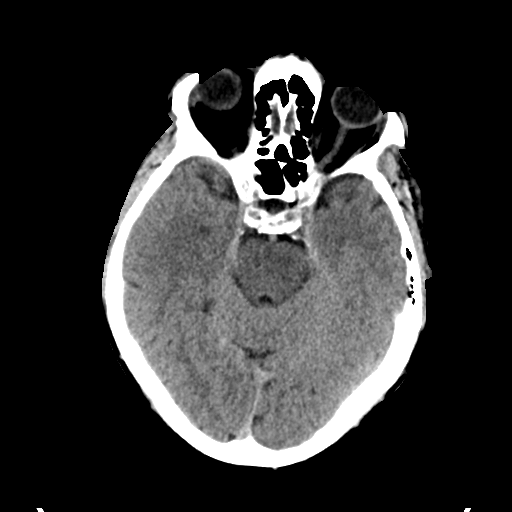
[im 14/33  brain]
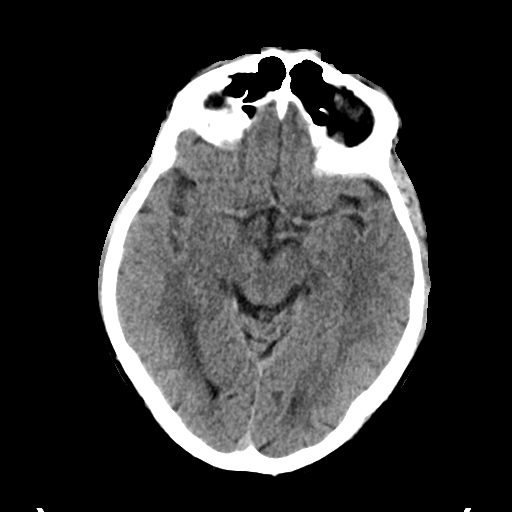
[im 16/33  brain]
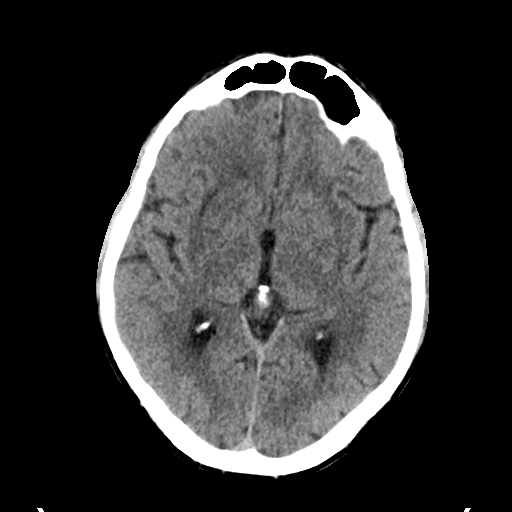
[im 17/33  brain]
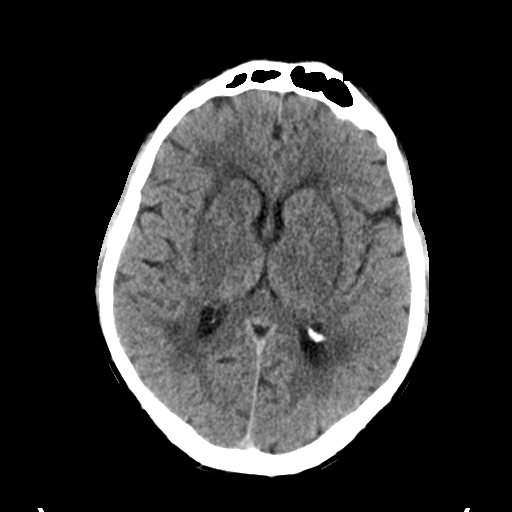
[im 17/33  bone]
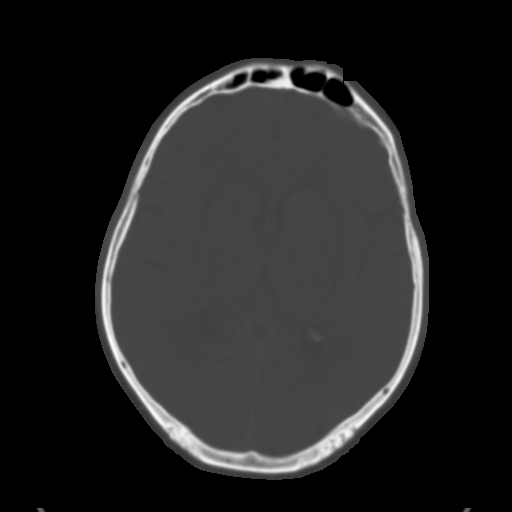
[im 19/33  brain]
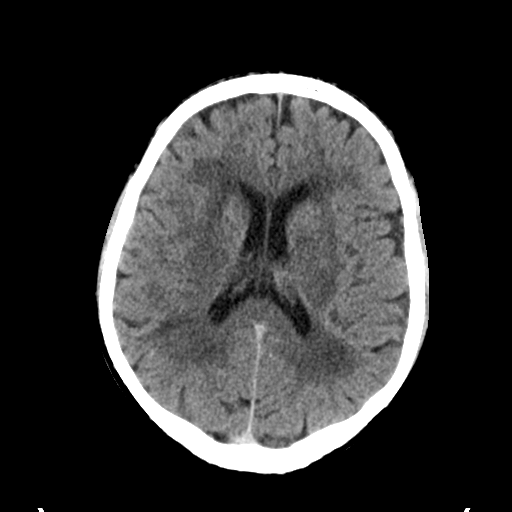
[im 21/33  brain]
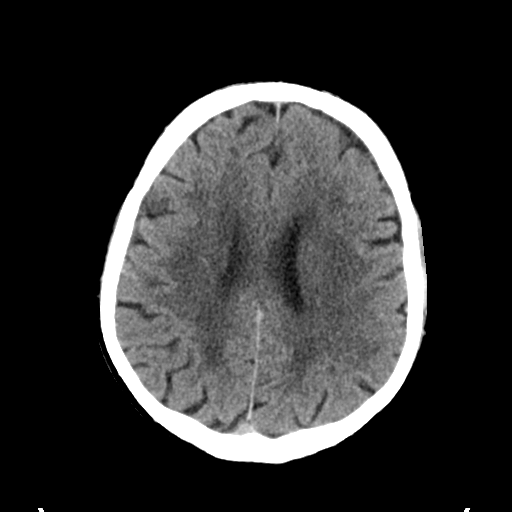
[im 24/33  brain]
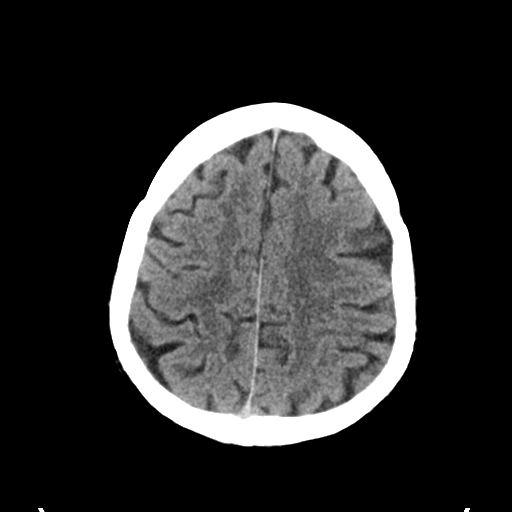
[im 25/33  brain]
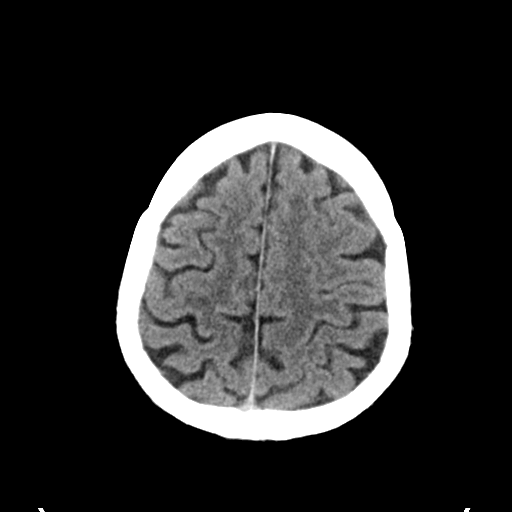
[im 25/33  bone]
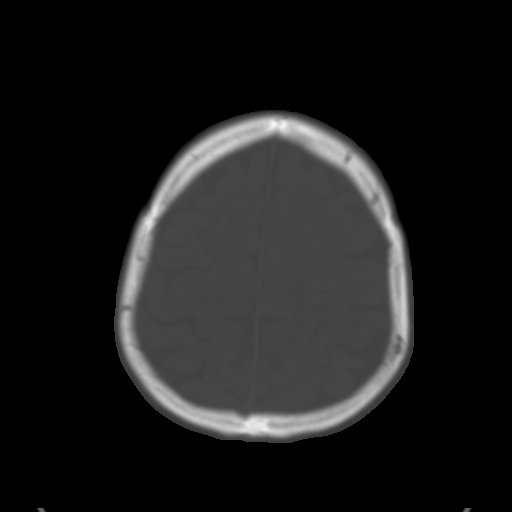
[im 27/33  brain]
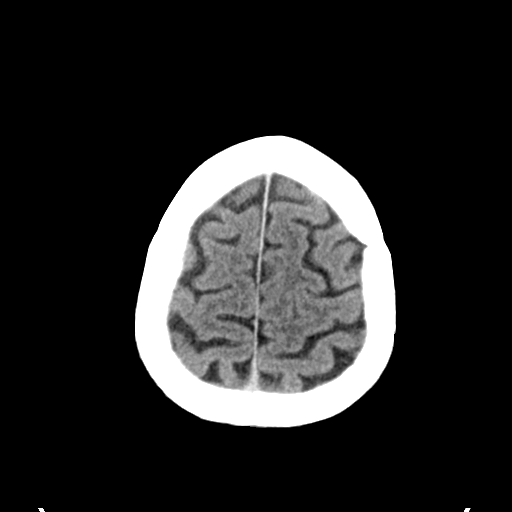
[im 29/33  brain]
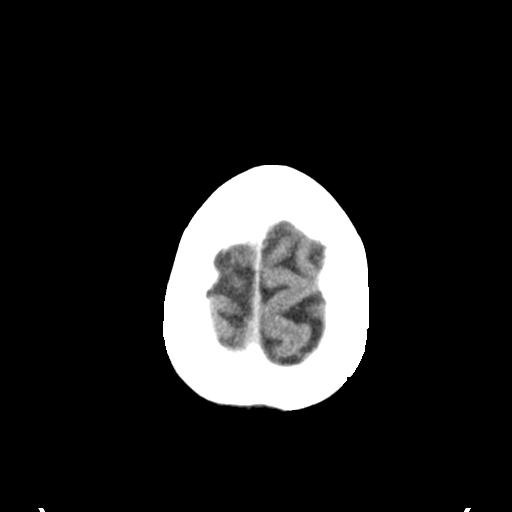
[im 31/33  brain]
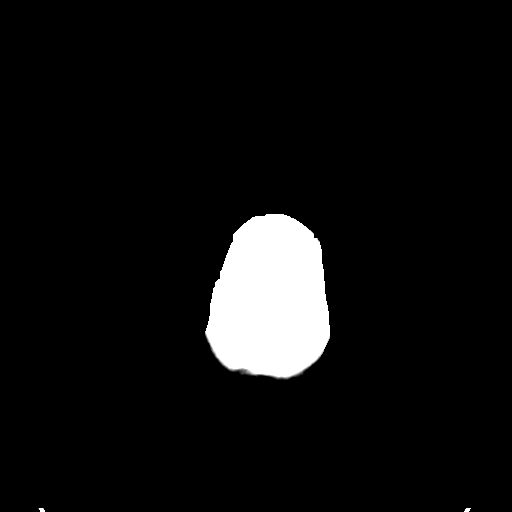

[16 of 30 positions shown; findings below may reference images not displayed]

FINDINGS: Previously identified small parenchymal hemorrhage within the right
parietal lobe is stable in size and appearance measuring 8 mm. No
new intracranial hemorrhage is identified. No mass effect or midline
shift. There is no intraventricular or extra-axial hemorrhage. No
hydrocephalus. No acute intracranial infarct. Chronic microvascular
ischemic changes are again noted, unchanged.

Calvarium remains intact.  Orbits are normal.

Paranasal sinuses and mastoid air cells are clear.
IMPRESSION: Unchanged 8 mm parenchymal hemorrhage within the right parietal
lobe. No new intracranial hemorrhage identified. No hydrocephalus or
midline shift.

## 2014-05-22 ENCOUNTER — Ambulatory Visit (INDEPENDENT_AMBULATORY_CARE_PROVIDER_SITE_OTHER): Payer: PPO | Admitting: Internal Medicine

## 2014-05-22 ENCOUNTER — Encounter: Payer: Self-pay | Admitting: Internal Medicine

## 2014-05-22 VITALS — BP 130/80 | HR 82 | Temp 97.9°F | Wt 196.0 lb

## 2014-05-22 DIAGNOSIS — G629 Polyneuropathy, unspecified: Secondary | ICD-10-CM | POA: Diagnosis not present

## 2014-05-22 NOTE — Progress Notes (Signed)
Subjective:    Patient ID: Randall Reeves, male    DOB: [birth year], 69 y.o.   MRN: [id number]  HPI Here due to worsening numbness in his feet Has progressed over time Now having some pain in the evenings--more noticeable after doing a lot of walking Right foot somewhat worse then left Doesn't notice it as much most of the day--but more noticeable in evening after sitting down after dinner  Pustejovsky stumble some-- due to the numbness Has fallen 3 times since my last visit--but always due to improper safety (stepping on cardboard that slipped, night time getting feet tangled in brush while gathering wood)  Current Outpatient Prescriptions on File Prior to Visit  Medication Sig Dispense Refill  . aspirin 81 MG chewable tablet Chew 81 mg by mouth daily.    Marland Kitchen atorvastatin (LIPITOR) 10 MG tablet Take 1 tablet (10 mg total) by mouth daily. 90 tablet 3  . losartan (COZAAR) 50 MG tablet take 1 tablet by mouth once daily 30 tablet 5  . Multiple Vitamin (MULTIVITAMIN) tablet Take 1 tablet by mouth daily.     Marland Kitchen omeprazole (PRILOSEC) 20 MG capsule Take 20 mg by mouth daily.      . tadalafil (CIALIS) 5 MG tablet Take 1 tablet (5 mg total) by mouth daily. 30 tablet 11   No current facility-administered medications on file prior to visit.    Allergies  Allergen Reactions  . Bisoprolol-Hydrochlorothiazide     REACTION: muscle pain, cramps  . Codeine Nausea Only    Past Medical History  Diagnosis Date  . Allergy   . GERD (gastroesophageal reflux disease)   . Hypertension   . Hyperlipidemia   . Obstructive sleep apnea   . Coronary atherosclerosis of unspecified type of vessel, native or graft 2011    MI but cath benign. Dr Otis Brace    Past Surgical History  Procedure Laterality Date  . Spermatocele repair    . Cardiac catheterization    . Carpal tunnel release      Family History  Problem Relation Age of Onset  . Stroke Mother   . Hypertension Mother   . Diabetes Father   . Asthma Father    . Heart failure Father   . Heart disease Father   . Hypertension Sister   . Diabetes Sister   . Neuropathy Brother   . Hypertension Brother   . Cancer Brother   . Diabetes Brother   . Heart disease Brother   . Hypertension Brother   . Hypertension Brother   . Hypertension Brother   . Cancer Brother     prostate    History   Social History  . Marital Status: Married    Spouse Name: N/A  . Number of Children: 2  . Years of Education: N/A   Occupational History  . landscaping    Social History Main Topics  . Smoking status: Never Smoker   . Smokeless tobacco: Never Used  . Alcohol Use: 1.8 - 3.0 oz/week    3-5 Cans of beer per week     Comment: a beer a day  . Drug Use: No  . Sexual Activity: Not on file   Other Topics Concern  . Not on file   Social History Narrative   Has living will   Wife is his health care POA.   Would accept resuscitation attempts.   Would leave feeding tube decision to his wife   Review of Systems  Feels he has some leg  weakness--but perhaps just over many years, not recently No fever Has felt well No significant numbness in hands--perhaps very mild symptoms there     Objective:   Physical Exam  Constitutional: He appears well-developed and well-nourished. No distress.  Neurological:  Mildly decreased sensation on plantar feet. ?slight difference on palms Normal strength, gait, etc          Assessment & Plan:

## 2014-05-22 NOTE — Progress Notes (Signed)
Pre visit review using our clinic review tool, if applicable. No additional management support is needed unless otherwise documented below in the visit note. 

## 2014-05-22 NOTE — Assessment & Plan Note (Signed)
Has progressed B12 and glucose normal Will have him stop the statin in case that could be implicated He has 1 brother who had neuropathy that was severe (also some intracranial process requiring shunt---- NPH?) Recommended neurologist-- he wishes to hold off for now

## 2014-05-22 NOTE — Patient Instructions (Signed)
Please stop the atorvastatin. If your numbness worsens, let me know and I will set you up with a neurologist.

## 2014-06-14 ENCOUNTER — Telehealth: Payer: Self-pay | Admitting: Cardiology

## 2014-06-14 NOTE — Telephone Encounter (Signed)
Aware he doesn't need labs at this time.

## 2014-06-14 NOTE — Telephone Encounter (Signed)
Nothing else needed. Candee Furbish, MD

## 2014-06-14 NOTE — Telephone Encounter (Signed)
New Prob    Calling to see if lab work is necessary prior to next follow up appointment. Please call.

## 2014-06-14 NOTE — Telephone Encounter (Signed)
Lipid, T4 and BMP checked in 12/15 by Dr Silvio Pate.  Will ask Dr Marlou Porch if there is anything the pt needs.

## 2014-06-20 ENCOUNTER — Ambulatory Visit (INDEPENDENT_AMBULATORY_CARE_PROVIDER_SITE_OTHER): Payer: PPO | Admitting: Cardiology

## 2014-06-20 ENCOUNTER — Encounter: Payer: Self-pay | Admitting: Cardiology

## 2014-06-20 VITALS — BP 120/76 | HR 80 | Ht 69.0 in | Wt 193.8 lb

## 2014-06-20 DIAGNOSIS — I2510 Atherosclerotic heart disease of native coronary artery without angina pectoris: Secondary | ICD-10-CM | POA: Diagnosis not present

## 2014-06-20 DIAGNOSIS — E785 Hyperlipidemia, unspecified: Secondary | ICD-10-CM

## 2014-06-20 DIAGNOSIS — I10 Essential (primary) hypertension: Secondary | ICD-10-CM

## 2014-06-20 DIAGNOSIS — I2583 Coronary atherosclerosis due to lipid rich plaque: Principal | ICD-10-CM

## 2014-06-20 NOTE — Patient Instructions (Signed)
Medication Instructions:  Your physician recommends that you continue on your current medications as directed. Please refer to the Current Medication list given to you today.  Follow-Up: As needed  Thank you for choosing [facility]!!

## 2014-06-20 NOTE — Progress Notes (Signed)
[facility]. [address] [place], [place]  [address] Phone: [phone] Fax:  [phone]  Date:  06/20/2014   ID:  Randall Reeves, DOB [birth year], MRN [id number]  PCP:  Viviana Simpler, MD   History of Present Illness: Randall Reeves is a 69 y.o. male with prior non-ST elevation myocardial infarction and 2011 with catheterization showing no significant coronary artery disease here for followup.  Previously, was previously having difficulty with dizziness, saw ENT. I have changed him/discontinued HCT portion of losartan in the past because I was concerned about orthostatic hypotension. He will feel this when bending over and picking up a heavy object. Lawnmower for instance. When he feels dizzy, his blood pressures are usually in the low 100s, for instance.  Had a fall and has some low back pain. 20 foot ladder with chainsaw. 2 days in hospital. 11/23/12.  Overall doing quite well. No changes, no chest pain, no shortness of breath. Leg numbness, peripheral neuropathy Oberman have improved after discontinuation of atorvastatin.      Wt Readings from Last 3 Encounters:  06/20/14 193 lb 12.8 oz (87.907 kg)  05/22/14 196 lb (88.905 kg)  01/10/14 198 lb (89.812 kg)     Past Medical History  Diagnosis Date  . Allergy   . GERD (gastroesophageal reflux disease)   . Hypertension   . Hyperlipidemia   . Obstructive sleep apnea   . Coronary atherosclerosis of unspecified type of vessel, native or graft 2011    MI but cath benign. Dr Otis Brace    Past Surgical History  Procedure Laterality Date  . Spermatocele repair    . Cardiac catheterization    . Carpal tunnel release      Current Outpatient Prescriptions  Medication Sig Dispense Refill  . aspirin 81 MG chewable tablet Chew 81 mg by mouth daily.    Marland Kitchen losartan (COZAAR) 50 MG tablet take 1 tablet by mouth once daily 30 tablet 5  . Multiple Vitamin (MULTIVITAMIN) tablet Take 1 tablet by mouth daily.     Marland Kitchen omeprazole (PRILOSEC) 20 MG  capsule Take 20 mg by mouth daily.      . tadalafil (CIALIS) 5 MG tablet Take 1 tablet (5 mg total) by mouth daily. (Patient taking differently: Take 5 mg by mouth daily as needed. ) 30 tablet 11   No current facility-administered medications for this visit.    Allergies:    Allergies  Allergen Reactions  . Bisoprolol-Hydrochlorothiazide     REACTION: muscle pain, cramps  . Codeine Nausea Only    Social History:  The patient  reports that he has never smoked. He has never used smokeless tobacco. He reports that he drinks about 1.8 - 3.0 oz of alcohol per week. He reports that he does not use illicit drugs.   Family History  Problem Relation Age of Onset  . Stroke Mother   . Hypertension Mother   . Diabetes Father   . Asthma Father   . Heart failure Father   . Heart disease Father   . Hypertension Sister   . Diabetes Sister   . Neuropathy Brother   . Hypertension Brother   . Cancer Brother   . Diabetes Brother   . Heart disease Brother   . Hypertension Brother   . Hypertension Brother   . Hypertension Brother   . Cancer Brother     prostate    ROS:  Please see the history of present illness.   Denies  any fevers, chills, orthopnea, PND, bleeding. After broken ribs from fall off of ladder, some residual rib pain.   All other systems reviewed and negative.   PHYSICAL EXAM: VS:  BP 120/76 mmHg  Pulse 80  Ht 5\' 9"  (1.753 m)  Wt 193 lb 12.8 oz (87.907 kg)  BMI 28.61 kg/m2 Well nourished, well developed, in no acute distress HEENT: normal, [place]/AT, EOMI Neck: no JVD, normal carotid upstroke, no bruit Cardiac:  normal S1, S2; RRR; no murmur Lungs:  clear to auscultation bilaterally, no wheezing, rhonchi or rales Abd: soft, nontender, no hepatomegaly, no bruits Ext: no edema, 2+ distal pulses Skin: warm and dry GU: deferred Neuro: no focal abnormalities noted, AAO x 3  EKG:  06/20/14-sinus rhythm, 80, no other abnormalities. 11/23/12-sinus rhythm, no other significant  abnormalities.     ECHO: 11/24/12 - normal EF, grade 1 diastolic dysfunction  ASSESSMENT AND PLAN:  1. Old MI-NSTEMI - possibly type II, demand ischemia, minimally elevated troponin of 0.18 Coronary arteries were reassuring. doing well, no anginal symptoms. Secondary prevention. 2. Hyperlipidemia-stopped taking atorvastatin secondary to leg numbness. If he desires, Bless wish to try a low dose for prevention reasons. 3. Hypertension-controlled. Angiotensin receptor blocker. In the past, HCT portion had been discontinued because of dizziness. He is no longer dizzy. 4. Mildly elevated/upper normal creatinine-1.3. Encouraged him to avoid NSAIDs. 5. Since he has been quite stable since 2011, I will see him back on as needed basis. If Dr. Silvio Pate wishes him to come back on an annual basis I'm more than happy to oblige. He is happy with this.  Signed, Candee Furbish, MD [facility]  06/20/2014 8:10 AM

## 2014-07-23 ENCOUNTER — Other Ambulatory Visit: Payer: Self-pay | Admitting: Internal Medicine

## 2014-08-29 ENCOUNTER — Telehealth: Payer: Self-pay | Admitting: Internal Medicine

## 2014-08-29 DIAGNOSIS — G629 Polyneuropathy, unspecified: Secondary | ICD-10-CM

## 2014-08-29 NOTE — Telephone Encounter (Signed)
Pt wife called requesting referral to neurologist. There was mention at 04/2014 appt if patients foot pain had worsened we could refer to neuro. They would like to proceed with referral. Pt wife would like [facility]. Informed wife once referral was entered we would attach to [facility] and they would call them directly to scheduled appt and could be a few weeks. Please advise

## 2014-09-04 ENCOUNTER — Encounter: Payer: Self-pay | Admitting: Neurology

## 2014-09-04 ENCOUNTER — Ambulatory Visit (INDEPENDENT_AMBULATORY_CARE_PROVIDER_SITE_OTHER): Payer: PPO | Admitting: Neurology

## 2014-09-04 VITALS — BP 132/96 | HR 76 | Resp 14 | Ht 69.0 in | Wt 195.4 lb

## 2014-09-04 DIAGNOSIS — N529 Male erectile dysfunction, unspecified: Secondary | ICD-10-CM | POA: Diagnosis not present

## 2014-09-04 DIAGNOSIS — G629 Polyneuropathy, unspecified: Secondary | ICD-10-CM | POA: Diagnosis not present

## 2014-09-04 DIAGNOSIS — G4700 Insomnia, unspecified: Secondary | ICD-10-CM | POA: Diagnosis not present

## 2014-09-04 DIAGNOSIS — R200 Anesthesia of skin: Secondary | ICD-10-CM

## 2014-09-04 DIAGNOSIS — R208 Other disturbances of skin sensation: Secondary | ICD-10-CM

## 2014-09-04 MED ORDER — IMIPRAMINE HCL 25 MG PO TABS: 25.0000 mg | ORAL_TABLET | Freq: Every day | ORAL | Status: DC

## 2014-09-04 NOTE — Progress Notes (Signed)
[facility]  PATIENT: Randall Reeves DOB: [birth year]  REFERRING DOCTOR OR PCP:  Viviana Simpler  SOURCE: patient and records from Dr. Silvio Pate in EMR, labs in EMR, images on PACS  _________________________________   HISTORICAL  CHIEF COMPLAINT:  Chief Complaint  Patient presents with  . Numbness    Sts. he has noticed increasing numbness in all toes of both feet, and both heels, over the last year.  By the end of the day, and esp. in air conditioning, he has pain in the bottoms of both feet. Sts. over the last month he has found himself holding onto things for balance.  Sts. he has had some mild back pain onset 2014 after a 20 ft. fall from a ladder--he sustained a tbi and 5 fx. ribs in that fall, was hospitalized at [facility].  Sts. has occasional tingling in back of left leg, sometimes  . Back Pain    feels as if someone is pouring warm water on the back of left leg.  He has never seen anyone for his back pain./fim    HISTORY OF PRESENT ILLNESS:  I had the pleasure of seeing you patient, Randall Reeves, at [facility] for a neurologic consultation regarding his numbness.  As you know, he is a 69 year old man,  who began to experience numbness in the feet about 9 or 10  months ago.   Initially he had numbness without pain. However, as the months gone by he is experiencing more pain. Typically, he will wake up with very little pain in the feet. As the day goes on, especially if he is standing on his feet are long time he will notice more pain. Pain also increases if he is standing on a ladder or after sitting for a while when he stands up. He describes the pain as a pins and needles sensation. The numbness is in the toes and heels, mostly. He does not note any symptoms above the ankles on either foot. For the most part, the symptoms are fairly symmetric.   More recently, he has noted just a little numbness in the fingertips of both hands. He denies any  weakness.  He does note some urinary frequency and urgency. However, this has been a long time problem and there has not been any changes recently. He has erectile dysfunction since a myocardial infarction 4 years ago. He denies any difficulty with his vision. He denies any history of diabetes.  He has sleep maintenance insomnia. He usually falls asleep easily but when he wakes up he will sometimes have difficulty falling back asleep.   He wakes up about 3 times at night to use the bathroom. He has not been told that his prostate is large.  In October 2014, he was cutting a large limb from a tree when it fell off, knocking him off the ladder. Then the 300 pound limb landed on him. Had a small right parietal hemorrhage and broke 5 ribs and his wrist. He had some more back pain afterwards.  I personally reviewed CT scans from 11/23/2012 and 11/24/2012. He had a small right parietal hemorrhage was already better the next day. Additionally, there was more than expected white matter hypodense foci consistent with chronic microvascular ischemic change   REVIEW OF SYSTEMS: Constitutional: No fevers, chills, sweats, or change in appetite Eyes: No visual changes, double vision, eye pain Ear, nose and throat: No hearing loss, ear pain, nasal congestion, sore throat Cardiovascular: No chest pain,  palpitations Respiratory: No shortness of breath at rest or with exertion.   No wheezes GastrointestinaI: No nausea, vomiting, diarrhea, abdominal pain, fecal incontinence Genitourinary: urinary frequency and nocturia.   ED Musculoskeletal: No neck pain, back pain Integumentary: No rash, pruritus, skin lesions Neurological: as above Psychiatric: No depression at this time.  No anxiety Endocrine: No palpitations, diaphoresis, change in appetite, change in weigh or increased thirst Hematologic/Lymphatic: No anemia, purpura, petechiae. Allergic/Immunologic: No itchy/runny eyes, nasal congestion, recent allergic  reactions, rashes  ALLERGIES: Allergies  Allergen Reactions  . Bisoprolol-Hydrochlorothiazide     REACTION: muscle pain, cramps  . Codeine Nausea Only    HOME MEDICATIONS:  Current outpatient prescriptions:  .  aspirin 81 MG chewable tablet, Chew 81 mg by mouth daily., Disp: , Rfl:  .  losartan (COZAAR) 50 MG tablet, take 1 tablet by mouth once daily, Disp: 90 tablet, Rfl: 3 .  Multiple Vitamin (MULTIVITAMIN) tablet, Take 1 tablet by mouth daily. , Disp: , Rfl:  .  omeprazole (PRILOSEC) 20 MG capsule, Take 20 mg by mouth daily.  , Disp: , Rfl:  .  tadalafil (CIALIS) 5 MG tablet, Take 1 tablet (5 mg total) by mouth daily. (Patient taking differently: Take 5 mg by mouth daily as needed. ), Disp: 30 tablet, Rfl: 11  PAST MEDICAL HISTORY: Past Medical History  Diagnosis Date  . Allergy   . GERD (gastroesophageal reflux disease)   . Hypertension   . Hyperlipidemia   . Obstructive sleep apnea   . Coronary atherosclerosis of unspecified type of vessel, native or graft 2011    MI but cath benign. Dr Otis Brace    PAST SURGICAL HISTORY: Past Surgical History  Procedure Laterality Date  . Spermatocele repair    . Cardiac catheterization    . Carpal tunnel release      FAMILY HISTORY: Family History  Problem Relation Age of Onset  . Stroke Mother   . Hypertension Mother   . Diabetes Father   . Asthma Father   . Heart failure Father   . Heart disease Father   . Hypertension Sister   . Diabetes Sister   . Neuropathy Brother   . Hypertension Brother   . Cancer Brother   . Diabetes Brother   . Heart disease Brother   . Hypertension Brother   . Hypertension Brother   . Hypertension Brother   . Cancer Brother     prostate    SOCIAL HISTORY:  Social History   Social History  . Marital Status: Married    Spouse Name: N/A  . Number of Children: 2  . Years of Education: N/A   Occupational History  . landscaping    Social History Main Topics  . Smoking status: Never  Smoker   . Smokeless tobacco: Never Used  . Alcohol Use: 1.8 - 3.0 oz/week    3-5 Cans of beer per week     Comment: a beer a day  . Drug Use: No  . Sexual Activity: Not on file   Other Topics Concern  . Not on file   Social History Narrative   Has living will   Wife is his health care POA.   Would accept resuscitation attempts.   Would leave feeding tube decision to his wife     PHYSICAL EXAM  Filed Vitals:   09/04/14 1036  BP: 132/96  Pulse: 76  Resp: 14  Height: 5' 9"  (1.753 m)  Weight: 195 lb 6.4 oz (88.633 kg)  Body mass index is 28.84 kg/(m^2).   General: The patient is well-developed and well-nourished and in no acute distress  Neck: The neck is supple, no carotid bruits are noted.  The neck is nontender.  Cardiovascular: The heart has a regular rate and rhythm with a normal S1 and S2. There were no murmurs, gallops or rubs.   Skin: Extremities are without significant edema.   Neurologic Exam  Mental status: The patient is alert and oriented x 3 at the time of the examination. The patient has apparent normal recent and remote memory, with an apparently normal attention span and concentration ability.   Speech is normal.  Cranial nerves: Extraocular movements are full. Pupils are equal, round, and reactive to light and accomodation.  Visual fields are full.  Facial symmetry is present. There is good facial sensation to soft touch bilaterally.Facial strength is normal.  Trapezius and sternocleidomastoid strength is normal. No dysarthria is noted.  The tongue is midline, and the patient has symmetric elevation of the soft palate. No obvious hearing deficits are noted.  Motor:  Muscle bulk is normal.   Tone is normal. Strength is  5 / 5 in all 4 extremities.   Sensory: Sensory testing is intact to touch and vibration sensation in all 4 extremities but reduced temperature sensation in both feet..  Coordination: Cerebellar testing reveals good finger-nose-finger  bilaterally.  Gait and station: Station is normal.   Gait is normal. Tandem gait is normal. Romberg is negative.   Reflexes: Deep tendon reflexes are symmetric and normal bilaterally.   Plantar responses are flexor.    DIAGNOSTIC DATA (LABS, IMAGING, TESTING) - I reviewed patient records, labs, notes, testing and imaging myself where available.  Lab Results  Component Value Date   WBC 5.2 06/05/2013   HGB 15.1 06/05/2013   HCT 44.7 06/05/2013   MCV 94.6 06/05/2013   PLT 230.0 06/05/2013      Component Value Date/Time   NA 135 01/08/2014 1157   K 4.4 01/08/2014 1157   CL 104 01/08/2014 1157   CO2 26 01/08/2014 1157   GLUCOSE 99 01/08/2014 1157   BUN 22 01/10/2014 1121   CREATININE 1.38* 01/10/2014 1121   CREATININE 1.3 01/08/2014 1157   CALCIUM 9.4 01/08/2014 1157   PROT 7.3 10/30/2012 1604   ALBUMIN 4.1 01/08/2014 1157   AST 20 10/30/2012 1604   ALT 20 10/30/2012 1604   ALKPHOS 76 10/30/2012 1604   BILITOT 0.5 10/30/2012 1604   GFRNONAA 70* 11/25/2012 0618   GFRAA 81* 11/25/2012 0618   Lab Results  Component Value Date   CHOL 204* 01/08/2014   HDL 37.50* 01/08/2014   LDLCALC 68 06/05/2013   LDLDIRECT 115.2 01/08/2014   TRIG 292.0* 01/08/2014   CHOLHDL 5 01/08/2014   No results found for: HGBA1C Lab Results  Component Value Date   ZYSAYTKZ60 109 01/08/2014   Lab Results  Component Value Date   TSH 2.45 10/30/2012       ASSESSMENT AND PLAN  Peripheral neuropathy - Plan: NCV with EMG(electromyography), Sedimentation rate, Cryoglobulin, Multiple myeloma panel, serum  Sensory loss - Plan: NCV with EMG(electromyography)  Erectile dysfunction, unspecified erectile dysfunction type  Dysesthesia - Plan: NCV with EMG(electromyography), Sedimentation rate, Cryoglobulin, Multiple myeloma panel, serum  Insomnia   In summary, Mr. Garant is a 69 year old man with numbness and pain in his feet most consistent with a mild peripheral neuropathy. It is unclear  whether his erectile dysfunction could be related.   Vitamin B12 was normal  recently. Neck for other treatable causes of polyneuropathy by checking ESR, cryoglobulins, SPEP/IEF.  Additionally, we will get a nerve conduction studies/EMG.    To help with her pain I will add nighttime imipramine. If he has any issues with his medication we will consider gabapentin or lamotrigine.  He will return to see me for the NCV/EMG and call sooner if he has new or worsening neurologic symptoms. Based on the results of all of the studies, further follow-up [place] be needed.  Thank you for asking me to see Mr. Haymond for a neurologic consultation. Please let me know if I can be of further assistance with him or other patients in the future.   Rumaisa Schnetzer A. Felecia Shelling, MD, PhD 09/04/313, 94:58 AM Certified in Neurology, Clinical Neurophysiology, Sleep Medicine, Pain Medicine and Neuroimaging  [facility] [address] [place],  [address] [phone]

## 2014-09-09 LAB — MULTIPLE MYELOMA PANEL, SERUM
ALBUMIN SERPL ELPH-MCNC: 4 g/dL (ref 2.9–4.4)
ALPHA 1: 0.2 g/dL (ref 0.0–0.4)
ALPHA2 GLOB SERPL ELPH-MCNC: 0.6 g/dL (ref 0.4–1.0)
Albumin/Glob SerPl: 1.2 (ref 0.7–1.7)
B-GLOBULIN SERPL ELPH-MCNC: 1.1 g/dL (ref 0.7–1.3)
GLOBULIN, TOTAL: 3.5 g/dL (ref 2.2–3.9)
Gamma Glob SerPl Elph-Mcnc: 1.5 g/dL (ref 0.4–1.8)
IGM (IMMUNOGLOBULIN M), SRM: 41 mg/dL (ref 20–172)
IgA/Immunoglobulin A, Serum: 268 mg/dL (ref 61–437)
IgG (Immunoglobin G), Serum: 1599 mg/dL (ref 700–1600)
Total Protein: 7.5 g/dL (ref 6.0–8.5)

## 2014-09-09 LAB — CRYOGLOBULIN

## 2014-09-09 LAB — SEDIMENTATION RATE: Sed Rate: 3 mm/hr (ref 0–30)

## 2014-09-11 ENCOUNTER — Telehealth: Payer: Self-pay | Admitting: *Deleted

## 2014-09-11 NOTE — Telephone Encounter (Signed)
LMTC./fim 

## 2014-09-11 NOTE — Telephone Encounter (Signed)
-----   Message from Britt Bottom, MD sent at 09/09/2014  4:59 PM EDT ----- Please let him know that the blood work was normal.

## 2014-09-24 ENCOUNTER — Telehealth: Payer: Self-pay | Admitting: Neurology

## 2014-09-24 NOTE — Telephone Encounter (Signed)
He is stopping the medication today.

## 2014-09-24 NOTE — Telephone Encounter (Signed)
Patient called [phone] or cell ph [phone]  regarding side effects of imipramine (TOFRANIL) 25 MG tablet. Sweating, dizziness, has effected sexual activity.

## 2014-09-24 NOTE — Telephone Encounter (Signed)
Spoke to [place] - he has been taking imipramine 25mg , qhs for 17 days.  He has noted the following side effects since starting this medication:  Dry mouth, increased sweating, dizziness and decreased sexually ability (says Cilalis worked for him prior to starting imipramine and now it is ineffective).  He would like to discontinue this medication and discuss further treatment options with Dr. Felecia Shelling when he comes in on 10/03/14 for his EMG/NCV.

## 2014-09-25 NOTE — Telephone Encounter (Signed)
-----   Message from Richard A Sater, MD sent at 09/09/2014  4:59 PM EDT ----- Please let him know that the blood work was normal. 

## 2014-09-25 NOTE — Telephone Encounter (Signed)
I have spoken with wife [name] and per RAS, advised that labwork was normal.  She verbalized understanding of same/fim

## 2014-09-25 NOTE — Telephone Encounter (Signed)
Noted/fim 

## 2014-10-03 ENCOUNTER — Ambulatory Visit (INDEPENDENT_AMBULATORY_CARE_PROVIDER_SITE_OTHER): Payer: PPO | Admitting: Neurology

## 2014-10-03 ENCOUNTER — Ambulatory Visit (INDEPENDENT_AMBULATORY_CARE_PROVIDER_SITE_OTHER): Payer: Self-pay | Admitting: Neurology

## 2014-10-03 DIAGNOSIS — G4700 Insomnia, unspecified: Secondary | ICD-10-CM | POA: Diagnosis not present

## 2014-10-03 DIAGNOSIS — R200 Anesthesia of skin: Secondary | ICD-10-CM

## 2014-10-03 DIAGNOSIS — G629 Polyneuropathy, unspecified: Secondary | ICD-10-CM

## 2014-10-03 DIAGNOSIS — R208 Other disturbances of skin sensation: Secondary | ICD-10-CM

## 2014-10-03 DIAGNOSIS — R413 Other amnesia: Secondary | ICD-10-CM

## 2014-10-03 DIAGNOSIS — Z0289 Encounter for other administrative examinations: Secondary | ICD-10-CM

## 2014-10-03 NOTE — Progress Notes (Signed)
   [place]   10/03/2014  Referred by Delfino Lovett A. Felecia Shelling, MD. PhD  Nerve conduction and EMG studies  HISTORY: Randall Reeves is a 69 year old man with a two-year history of numbness in both feet. Symptoms started after a bad fall where he landed on his back. Over the next couple months he had further progression of numbness in the soles of his feet and his toes. However, over the last 18 months, symptoms have been more constant   NERVE CONDUCTION STUDIES:  Bilateral peroneal motor responses, tibial motor responses and sural sensory responses were normal. The peroneal and tibial F-wave responses were normal. H reflex latencies were normal.  EMG STUDIES:  Needle EMG of the vastus medialis, anterior tibialis and gluteus medius muscles were normal. There were a few polyphasic units of normal amplitude and duration and normal recruitment within the peroneus longus, gastrocnemius and FDIO foot muscles.  IMPRESSION:  This is a borderline normal NCV/EMG of the legs.  There is no evidence of large fiber polyneuropathy, significant radiculopathy or myopathy.   A minimal S1 radiculopathy cannot be completely ruled out.   Kristen Bushway A. Felecia Shelling, MD, PhD Certified in Neurology, [place] Neurophysiology, Sleep Medicine, Pain Medicine and Neuroimaging  [facility] [address], [place], [place] [address] [phone]

## 2014-10-10 ENCOUNTER — Encounter: Payer: Self-pay | Admitting: Neurology

## 2014-10-10 DIAGNOSIS — R413 Other amnesia: Secondary | ICD-10-CM | POA: Insufficient documentation

## 2014-10-10 NOTE — Progress Notes (Signed)
[facility]  PATIENT: Randall Reeves DOB: 1945-03-24  _________________________________   HISTORICAL  CHIEF COMPLAINT:  Memory loss and numbness.  HISTORY OF PRESENT ILLNESS:  Randall Reeves is a 69 year old man who notes more difficulty with his memory more recently and has had numbness in his feet for the past 10 months, since an injury.     Memory:  He notes that he has been more forgetful over the past year. He gives a few examples of not remembering to do things and needing to rely on lists more frequently.   He feels his focus is a little reduced. He has not had difficulty with driving and feels he is still able to do most cognitive tasks without difficulty.  Numbness:   She had an injury 10-11 months ago when a large tree limb fell on him knocking him off a ladder he landed on his back. Shortly after that he began to notice symmetric numbness in both feet.  Numbness is only in the soles and the toes and does not extend above the ankles. His hands are not involved. Although it progressed over a period of about one month at the onset, the numbness has been mostly stable since that time.   He reports that it can be slightly uncomfortable but is not painful. He tried imipramine after his last visit but he had difficulty tolerating it so he stopped. At this time, he would prefer not to initiate another medication as the numbness is not that uncomfortable.    At his last visit we checked lab work including ESR, cryoglobulins and SPEP/IEF.   All these tests were normal.  Insomnia:   He has sleep maintenance insomnia. He usually falls asleep easily but when he wakes up he will sometimes have difficulty falling back asleep. He has not been noted to have sleep disordered breathing.  He wakes up about 3 times at night to use the bathroom. He has not been told that his prostate is large.  I have reviewed CT scans from 11/23/2012 and 11/24/2012. He had a small right parietal hemorrhage  that was already better the next day. Additionally, there was more than expected white matter hypodense foci consistent with chronic microvascular ischemic change   REVIEW OF SYSTEMS: Constitutional: No fevers, chills, sweats, or change in appetite.   Sleeps well Eyes: No visual changes, double vision, eye pain Ear, nose and throat: No hearing loss, ear pain, nasal congestion, sore throat Cardiovascular: No chest pain, palpitations Respiratory: No shortness of breath at rest or with exertion.   No wheezes GastrointestinaI: No nausea, vomiting, diarrhea, abdominal pain, fecal incontinence Genitourinary: urinary frequency and nocturia.  Has ED Musculoskeletal: No neck pain, back pain Integumentary: No rash, pruritus, skin lesions Neurological: as above Psychiatric: No depression at this time.  No anxiety Endocrine: No palpitations, diaphoresis, change in appetite, change in weigh or increased thirst Hematologic/Lymphatic: No anemia, purpura, petechiae. Allergic/Immunologic: No itchy/runny eyes, nasal congestion, recent allergic reactions, rashes  ALLERGIES: Allergies  Allergen Reactions  . Bisoprolol-Hydrochlorothiazide     REACTION: muscle pain, cramps  . Codeine Nausea Only    HOME MEDICATIONS:  Current outpatient prescriptions:  .  aspirin 81 MG chewable tablet, Chew 81 mg by mouth daily., Disp: , Rfl:  .  imipramine (TOFRANIL) 25 MG tablet, Take 1 tablet (25 mg total) by mouth at bedtime., Disp: 30 tablet, Rfl: 5 .  losartan (COZAAR) 50 MG tablet, take 1 tablet by mouth once daily, Disp: 90 tablet, Rfl:  3 .  Multiple Vitamin (MULTIVITAMIN) tablet, Take 1 tablet by mouth daily. , Disp: , Rfl:  .  omeprazole (PRILOSEC) 20 MG capsule, Take 20 mg by mouth daily.  , Disp: , Rfl:  .  tadalafil (CIALIS) 5 MG tablet, Take 1 tablet (5 mg total) by mouth daily. (Patient taking differently: Take 5 mg by mouth daily as needed. ), Disp: 30 tablet, Rfl: 11  PAST MEDICAL HISTORY: Past  Medical History  Diagnosis Date  . Allergy   . GERD (gastroesophageal reflux disease)   . Hypertension   . Hyperlipidemia   . Obstructive sleep apnea   . Coronary atherosclerosis of unspecified type of vessel, native or graft 2011    MI but cath benign. Dr Otis Brace    PAST SURGICAL HISTORY: Past Surgical History  Procedure Laterality Date  . Spermatocele repair    . Cardiac catheterization    . Carpal tunnel release      FAMILY HISTORY: Family History  Problem Relation Age of Onset  . Stroke Mother   . Hypertension Mother   . Diabetes Father   . Asthma Father   . Heart failure Father   . Heart disease Father   . Hypertension Sister   . Diabetes Sister   . Neuropathy Brother   . Hypertension Brother   . Cancer Brother   . Diabetes Brother   . Heart disease Brother   . Hypertension Brother   . Hypertension Brother   . Hypertension Brother   . Cancer Brother     prostate    SOCIAL HISTORY:  Social History   Social History  . Marital Status: Married    Spouse Name: N/A  . Number of Children: 2  . Years of Education: N/A   Occupational History  . landscaping    Social History Main Topics  . Smoking status: Never Smoker   . Smokeless tobacco: Never Used  . Alcohol Use: 1.8 - 3.0 oz/week    3-5 Cans of beer per week     Comment: a beer a day  . Drug Use: No  . Sexual Activity: Not on file   Other Topics Concern  . Not on file   Social History Narrative   Has living will   Wife is his health care POA.   Would accept resuscitation attempts.   Would leave feeding tube decision to his wife     PHYSICAL EXAM  There were no vitals filed for this visit.  There is no weight on file to calculate BMI.   General: The patient is well-developed and well-nourished and in no acute distress  Neck: The neck is supple, no carotid bruits are noted.  The neck is nontender.  Cardiovascular: The heart has a regular rate and rhythm with a normal S1 and S2. There  were no murmurs, gallops or rubs.   Skin: Extremities are without significant edema.   Neurologic Exam  Mental status: The patient is alert and oriented x 3 at the time of the examination. The patient has apparent normal recent (3/3 without prompts) and remote memory, with an apparently normal attention span ([id number]) and concentration ability.   He draws a clock well. He continues a geometric pattern with only one error. He is able to draw a cube with one error.   Speech is normal.  Cranial nerves: Extraocular movements are full.   Facial symmetry is present. There is good facial sensation to soft touch bilaterally.Facial strength is normal.  Trapezius and sternocleidomastoid strength  is normal. No dysarthria is noted.   No obvious hearing deficits are noted.  Motor:  Muscle bulk is normal.   Tone is normal. Strength is  5 / 5 in all 4 extremities.   Sensory: Sensory testing is intact to touch and vibration sensation in arms but reduced temperature sensation in both feet..  Coordination: Cerebellar testing reveals good finger-nose-finger bilaterally.  Gait and station: Station is normal.   Gait is normal. Tandem gait is normal. Romberg is negative.   Reflexes: Deep tendon reflexes are symmetric and normal bilaterally.        DIAGNOSTIC DATA (LABS, IMAGING, TESTING) - I reviewed patient records, labs, notes, testing and imaging myself where available.  Lab Results  Component Value Date   WBC 5.2 06/05/2013   HGB 15.1 06/05/2013   HCT 44.7 06/05/2013   MCV 94.6 06/05/2013   PLT 230.0 06/05/2013      Component Value Date/Time   NA 135 01/08/2014 1157   K 4.4 01/08/2014 1157   CL 104 01/08/2014 1157   CO2 26 01/08/2014 1157   GLUCOSE 99 01/08/2014 1157   BUN 22 01/10/2014 1121   CREATININE 1.38* 01/10/2014 1121   CREATININE 1.3 01/08/2014 1157   CALCIUM 9.4 01/08/2014 1157   PROT 7.5 09/04/2014 1127   PROT 7.3 10/30/2012 1604   ALBUMIN 4.1 01/08/2014 1157   AST  20 10/30/2012 1604   ALT 20 10/30/2012 1604   ALKPHOS 76 10/30/2012 1604   BILITOT 0.5 10/30/2012 1604   GFRNONAA 70* 11/25/2012 0618   GFRAA 81* 11/25/2012 0618   Lab Results  Component Value Date   CHOL 204* 01/08/2014   HDL 37.50* 01/08/2014   LDLCALC 68 06/05/2013   LDLDIRECT 115.2 01/08/2014   TRIG 292.0* 01/08/2014   CHOLHDL 5 01/08/2014   No results found for: HGBA1C Lab Results  Component Value Date   BFXOVANV91 660 01/08/2014   Lab Results  Component Value Date   TSH 2.45 10/30/2012       ASSESSMENT AND PLAN  Memory loss  Insomnia  Dysesthesia   1.   Memory difficulties appear to be mild, though bothersome to him. 2.  We will reevaluate memory function again in 5-6 months to make sure that he is not worsening.   He is advised to continue to be mentally active as well as physically active. 3.  If the sensory changes become more painful, consider the addition of a centrally acting agent such as gabapentin or lamotrigine. He will return to see me in 6 months or sooner if there are new or worsening neurologic symptoms.  Richard A. Felecia Shelling, MD, PhD 6/00/4599, 77:41 AM Certified in Neurology, Clinical Neurophysiology, Sleep Medicine, Pain Medicine and Neuroimaging  [facility] [address] [place], [place] [address] [phone]

## 2014-12-02 ENCOUNTER — Ambulatory Visit (INDEPENDENT_AMBULATORY_CARE_PROVIDER_SITE_OTHER): Payer: PPO | Admitting: Podiatry

## 2014-12-02 ENCOUNTER — Encounter: Payer: Self-pay | Admitting: Podiatry

## 2014-12-02 ENCOUNTER — Ambulatory Visit (INDEPENDENT_AMBULATORY_CARE_PROVIDER_SITE_OTHER): Payer: PPO

## 2014-12-02 VITALS — BP 120/82 | HR 94 | Resp 12

## 2014-12-02 DIAGNOSIS — M2011 Hallux valgus (acquired), right foot: Secondary | ICD-10-CM | POA: Diagnosis not present

## 2014-12-02 DIAGNOSIS — M205X1 Other deformities of toe(s) (acquired), right foot: Secondary | ICD-10-CM

## 2014-12-02 NOTE — Progress Notes (Signed)
   Subjective:    Patient ID: Randall Reeves, male    DOB: 14-May-1945, 68 y.o.   MRN: [id number]  HPI: He presents today with a 4 months history of pain to the plantar aspect of the first metatarsophalangeal joint right foot. He is tried over-the-counter insoles to no avail. He does not take medication. He states that he injured this the toe when he was 69 years old.      Review of Systems  Constitutional: Positive for fatigue.  Genitourinary: Positive for frequency.  Musculoskeletal: Positive for back pain.  All other systems reviewed and are negative.      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 Pulses are strongly palpable. Neurologic sensorium is intact per Semmes-Weinstein monofilament. Deep tendon reflexes are intact. Muscle strength +5 over 5 dorsiflexion plantar flexors and inverters everters all intrinsic musculature is intact. Orthopedic evaluation demonstrates all joints distal to the ankle level full range of motion without crepitation with exception of the first metatarsophalangeal joint of the right foot. There is a large nonpulsatile nodule to the dorsal and dorsomedial aspect of the first metatarsophalangeal joint. The joint is enlarged hypertrophic and is very sensitive to range of motion. Radiographs taken in the office today 3 views of the right foot demonstrates an osseously mature foot rectus in nature with severe osteophytic spurring dorsal medial and lateral aspect. The joint space is narrowed and the joint and subchondral regions are flattened and sclerotic. This is consistent with osteoarthritis and hallux rigidus. Cutaneous evaluation and straight supple well-hydrated cutis no erythema edema cellulitis drainage or odor.        Assessment & Plan:  Assessment: Hallux rigidus with spurring first metatarsophalangeal joint of the right foot  secondary to trauma.  Plan: Discussed etiology pathology conservative versus surgical therapies. We went over options today in detail consisting of fusion of the first metatarsophalangeal joint or replacement of the first metatarsophalangeal joint. He would like to consider a joint replacement and would like to discuss this with his wife and son to determine the best time to have this performed. He will bring her with him on a future appointment for a surgical consult regarding Keller arthroplasty with a single silicone implant right foot.

## 2014-12-30 ENCOUNTER — Encounter: Payer: Self-pay | Admitting: Podiatry

## 2014-12-30 ENCOUNTER — Ambulatory Visit (INDEPENDENT_AMBULATORY_CARE_PROVIDER_SITE_OTHER): Payer: PPO | Admitting: Podiatry

## 2014-12-30 VITALS — BP 133/90 | HR 90 | Resp 12

## 2014-12-30 DIAGNOSIS — M205X1 Other deformities of toe(s) (acquired), right foot: Secondary | ICD-10-CM

## 2014-12-30 NOTE — Patient Instructions (Signed)
Pre-Operative Instructions  Congratulations, you have decided to take an important step to improving your quality of life.  You can be assured that the doctors of [facility] will be with you every step of the way.  1. Plan to be at the surgery center/hospital at least 1 (one) hour prior to your scheduled time unless otherwise directed by the surgical center/hospital staff.  You must have a responsible adult accompany you, remain during the surgery and drive you home.  Make sure you have directions to the surgical center/hospital and know how to get there on time. 2. For hospital based surgery you will need to obtain a history and physical form from your family physician within 1 month prior to the date of surgery- we will give you a form for you primary physician.  3. We make every effort to accommodate the date you request for surgery.  There are however, times where surgery dates or times have to be moved.  We will contact you as soon as possible if a change in schedule is required.   4. No Aspirin/Ibuprofen for one week before surgery.  If you are on aspirin, any non-steroidal anti-inflammatory medications (Mobic, Aleve, Ibuprofen) you should stop taking it 7 days prior to your surgery.  You make take Tylenol  For pain prior to surgery.  5. Medications- If you are taking daily heart and blood pressure medications, seizure, reflux, allergy, asthma, anxiety, pain or diabetes medications, make sure the surgery center/hospital is aware before the day of surgery so they Poon notify you which medications to take or avoid the day of surgery. 6. No food or drink after midnight the night before surgery unless directed otherwise by surgical center/hospital staff. 7. No alcoholic beverages 24 hours prior to surgery.  No smoking 24 hours prior to or 24 hours after surgery. 8. Wear loose pants or shorts- loose enough to fit over bandages, boots, and casts. 9. No slip on shoes, sneakers are best. 10. Bring  your boot with you to the surgery center/hospital.  Also bring crutches or a walker if your physician has prescribed it for you.  If you do not have this equipment, it will be provided for you after surgery. 11. If you have not been contracted by the surgery center/hospital by the day before your surgery, call to confirm the date and time of your surgery. 12. Leave-time from work [place] vary depending on the type of surgery you have.  Appropriate arrangements should be made prior to surgery with your employer. 13. Prescriptions will be provided immediately following surgery by your doctor.  Have these filled as soon as possible after surgery and take the medication as directed. 14. Remove nail polish on the operative foot. 15. Wash the night before surgery.  The night before surgery wash the foot and leg well with the antibacterial soap provided and water paying special attention to beneath the toenails and in between the toes.  Rinse thoroughly with water and dry well with a towel.  Perform this wash unless told not to do so by your physician.  Enclosed: 1 Ice pack (please put in freezer the night before surgery)   1 Hibiclens skin cleaner   Pre-op Instructions  If you have any questions regarding the instructions, do not hesitate to call our office.  [place]: [address] [place], [place] [address] [phone]  [place]: [address] [place], [place] [address] [phone]  [place]: [address]  [place], [place] [address] [phone]  Dr. Richard   Tuchman DPM, Dr. Norman Regal DPM Dr. Richard Sikora DPM, Dr. M. Todd Hyatt DPM, Dr. Kathryn Egerton DPM 

## 2014-12-30 NOTE — Progress Notes (Signed)
He presents today for surgical consult regarding his hallux limitus/hallux rigidus right foot. He states that at this point conservative therapies have failed and he would like to consider surgical intervention. He presents with his wife grandson and granddaughter today.  Objective: Vital signs are stable alert and oriented 3. Pulses are strongly palpable neurologic sensorium is intact. Denies changes in his past medical history medications allergies surgery social history and review of systems. Orthopedics indicate hallux limitus first metatarsophalangeal joint right foot.  Assessment: Hallux rigidus right foot.  Plan: Discussed etiology pathology conservative versus surgical therapies. At this point we have decided to perform a Keller arthroplasty with single silicone implant right foot. I answered all of the questions regarding these procedures to the best of my ability in layman's terms. We discussed in great detail the possible complications which Mazzarella include are not limited to postop pain bleeding swelling infection recurrence the need for further surgery. I answered all of these questions. We dispensed a Cam Walker today as well as instructions for surgical intervention. I will follow up with him in January.

## 2014-12-31 ENCOUNTER — Telehealth: Payer: Self-pay | Admitting: *Deleted

## 2014-12-31 NOTE — Telephone Encounter (Signed)
"  I'd like to schedule my surgery.  I saw Dr. Milinda Pointer yesterday."  When would you like to schedule surgery?  "I'd like to schedule it on 01/31/2015."  Okay, I'll get it scheduled.  Surgical center will call you a day or two prior to surgery date and give you the arrival time.  Go ahead and register with the surgical center, online or via phone.  You should have been given a brochure with the instructions.  "Okay, I will.  Thank you."

## 2015-01-14 ENCOUNTER — Ambulatory Visit (INDEPENDENT_AMBULATORY_CARE_PROVIDER_SITE_OTHER): Payer: PPO | Admitting: Internal Medicine

## 2015-01-14 ENCOUNTER — Encounter: Payer: Self-pay | Admitting: Internal Medicine

## 2015-01-14 VITALS — BP 122/80 | HR 79 | Temp 98.3°F | Ht 69.0 in | Wt 195.0 lb

## 2015-01-14 DIAGNOSIS — N400 Enlarged prostate without lower urinary tract symptoms: Secondary | ICD-10-CM | POA: Diagnosis not present

## 2015-01-14 DIAGNOSIS — Z0000 Encounter for general adult medical examination without abnormal findings: Secondary | ICD-10-CM | POA: Diagnosis not present

## 2015-01-14 DIAGNOSIS — I722 Aneurysm of renal artery: Secondary | ICD-10-CM

## 2015-01-14 DIAGNOSIS — I728 Aneurysm of other specified arteries: Secondary | ICD-10-CM | POA: Diagnosis not present

## 2015-01-14 DIAGNOSIS — Z7189 Other specified counseling: Secondary | ICD-10-CM

## 2015-01-14 DIAGNOSIS — I10 Essential (primary) hypertension: Secondary | ICD-10-CM

## 2015-01-14 DIAGNOSIS — E785 Hyperlipidemia, unspecified: Secondary | ICD-10-CM | POA: Diagnosis not present

## 2015-01-14 LAB — CBC WITH DIFFERENTIAL/PLATELET
BASOS ABS: 0.1 10*3/uL (ref 0.0–0.1)
Basophils Relative: 1 % (ref 0.0–3.0)
EOS ABS: 0.2 10*3/uL (ref 0.0–0.7)
Eosinophils Relative: 3.3 % (ref 0.0–5.0)
HCT: 46.8 % (ref 39.0–52.0)
Hemoglobin: 15.6 g/dL (ref 13.0–17.0)
LYMPHS ABS: 1.2 10*3/uL (ref 0.7–4.0)
Lymphocytes Relative: 23.9 % (ref 12.0–46.0)
MCHC: 33.4 g/dL (ref 30.0–36.0)
MCV: 93.6 fl (ref 78.0–100.0)
MONO ABS: 0.6 10*3/uL (ref 0.1–1.0)
Monocytes Relative: 12.8 % — ABNORMAL HIGH (ref 3.0–12.0)
NEUTROS ABS: 3 10*3/uL (ref 1.4–7.7)
Neutrophils Relative %: 59 % (ref 43.0–77.0)
Platelets: 243 10*3/uL (ref 150.0–400.0)
RBC: 5 Mil/uL (ref 4.22–5.81)
RDW: 13.2 % (ref 11.5–15.5)
WBC: 5 10*3/uL (ref 4.0–10.5)

## 2015-01-14 LAB — COMPREHENSIVE METABOLIC PANEL
ALT: 20 U/L (ref 0–53)
AST: 18 U/L (ref 0–37)
Albumin: 4.5 g/dL (ref 3.5–5.2)
Alkaline Phosphatase: 69 U/L (ref 39–117)
BILIRUBIN TOTAL: 0.6 mg/dL (ref 0.2–1.2)
BUN: 31 mg/dL — ABNORMAL HIGH (ref 6–23)
CO2: 30 meq/L (ref 19–32)
CREATININE: 1.43 mg/dL (ref 0.40–1.50)
Calcium: 10.1 mg/dL (ref 8.4–10.5)
Chloride: 103 mEq/L (ref 96–112)
GFR: 52.39 mL/min — ABNORMAL LOW (ref >60.00)
GLUCOSE: 98 mg/dL (ref 70–99)
Potassium: 4.6 mEq/L (ref 3.5–5.1)
Sodium: 138 mEq/L (ref 135–145)
Total Protein: 7.6 g/dL (ref 6.0–8.3)

## 2015-01-14 LAB — PSA: PSA: 0.4 ng/mL (ref 0.10–4.00)

## 2015-01-14 LAB — LIPID PANEL
CHOLESTEROL: 217 mg/dL — AB (ref 0–200)
HDL: 48.5 mg/dL (ref >39.00)
LDL Cholesterol: 137 mg/dL — ABNORMAL HIGH (ref 0–99)
NonHDL: 168.57
Total CHOL/HDL Ratio: 4
Triglycerides: 158 mg/dL — ABNORMAL HIGH (ref 0.0–149.0)
VLDL: 31.6 mg/dL (ref 0.0–40.0)

## 2015-01-14 NOTE — Assessment & Plan Note (Signed)
Adverse reactions to statin Nothing for now Consider trying a different one if very high

## 2015-01-14 NOTE — Assessment & Plan Note (Signed)
On right Will continue yearly checks and check renal function

## 2015-01-14 NOTE — Assessment & Plan Note (Signed)
I have personally reviewed the Medicare Annual Wellness questionnaire and have noted 1. The patient's medical and social history 2. Their use of alcohol, tobacco or illicit drugs 3. Their current medications and supplements 4. The patient's functional ability including ADL's, fall risks, home safety risks and hearing or visual             impairment. 5. Diet and physical activities 6. Evidence for depression or mood disorders  The patients weight, height, BMI and visual acuity have been recorded in the chart I have made referrals, counseling and provided education to the patient based review of the above and I have provided the pt with a written personalized care plan for preventive services.  I have provided you with a copy of your personalized plan for preventive services. Please take the time to review along with your updated medication list.  Will check PSA after discussion Due for colon 2019 Discussed fitness ---non weight bearing options discussed due to foot problems

## 2015-01-14 NOTE — Assessment & Plan Note (Signed)
No symptoms Gets yearly testing

## 2015-01-14 NOTE — Assessment & Plan Note (Signed)
BP Readings from Last 3 Encounters:  01/14/15 122/80  12/30/14 133/90  12/02/14 120/82   Good control No change needed

## 2015-01-14 NOTE — Assessment & Plan Note (Signed)
Minimal symptoms No Rx 

## 2015-01-14 NOTE — Progress Notes (Signed)
Subjective:    Patient ID: Randall Reeves, male    DOB: 10-26-1945, 69 y.o.   MRN: [id number]  HPI Here for Medicare wellness and follow up of chronic medical conditions Reviewed form and advanced directives Reviewed other doctors Occasional alcohol drink No tobacco Tries to exercise--limited by foot pain Vision fine. Hearing mildly decreased but nothing major (wife feels there is an issue) No falls No depression or anhedonia Independent with instrumental ADLs Cognition is okay---recent neurologic evaluation for this  Has been seeing a podiatrist about a knot on his foot Diagnosed with bone spurs and arthritis Suggested replacement at 1st MTP Pain is significant when on feet all day--especially on concrete Affecting his walking also Still trying to decide about this Uses ibuprofen 400mg  twice a week or so  Done with the neurologist Memory is not really any worse Nerve conduction studies were normal Danielski be some better  Dismissed by cardiologist No chest pain or DOE No dizziness or syncope No edema No palpitations  Still goes annually for checks of his aneurysms No abdominal pain, claudication, etc  Has stayed off the atorvastatin Not sure if it was causing his sensory problems  Current Outpatient Prescriptions on File Prior to Visit  Medication Sig Dispense Refill  . aspirin 81 MG chewable tablet Chew 81 mg by mouth daily.    Marland Kitchen losartan (COZAAR) 50 MG tablet take 1 tablet by mouth once daily 90 tablet 3  . Multiple Vitamin (MULTIVITAMIN) tablet Take 1 tablet by mouth daily.     Marland Kitchen omeprazole (PRILOSEC) 20 MG capsule Take 20 mg by mouth daily.       No current facility-administered medications on file prior to visit.    Allergies  Allergen Reactions  . Bisoprolol-Hydrochlorothiazide     REACTION: muscle pain, cramps  . Codeine Nausea Only    Past Medical History  Diagnosis Date  . Allergy   . GERD (gastroesophageal reflux disease)   . Hypertension   .  Hyperlipidemia   . Obstructive sleep apnea   . Coronary atherosclerosis of unspecified type of vessel, native or graft 2011    MI but cath benign. Dr Otis Brace    Past Surgical History  Procedure Laterality Date  . Spermatocele repair    . Cardiac catheterization    . Carpal tunnel release      Family History  Problem Relation Age of Onset  . Stroke Mother   . Hypertension Mother   . Diabetes Father   . Asthma Father   . Heart failure Father   . Heart disease Father   . Hypertension Sister   . Diabetes Sister   . Neuropathy Brother   . Hypertension Brother   . Cancer Brother   . Diabetes Brother   . Heart disease Brother   . Hypertension Brother   . Hypertension Brother   . Hypertension Brother   . Cancer Brother     prostate    Social History   Social History  . Marital Status: Married    Spouse Name: N/A  . Number of Children: 2  . Years of Education: N/A   Occupational History  . landscaping    Social History Main Topics  . Smoking status: Never Smoker   . Smokeless tobacco: Never Used  . Alcohol Use: 1.8 - 3.0 oz/week    3-5 Cans of beer per week     Comment: a beer a day  . Drug Use: No  . Sexual Activity: Not on file  Other Topics Concern  . Not on file   Social History Narrative   Has living will   Wife is his health care POA.   Would accept resuscitation attempts.   Would leave feeding tube decision to his wife   Review of Systems Appetite is fine Weight is up a few pounds Sleeps fairly well-- uses CPAP regularly (not sure of pressure setting) Wears seat belt Regular with dentist Bowels are okay--rare rectal burning but no blood Voids fairly well-- occasional dribbling. Some urgency at times after coffee. Nocturia x 1 usually Skin is okay--keeps up with dermatologist. BCC removed in this past year    Objective:   Physical Exam  Constitutional: He appears well-developed and well-nourished. No distress.  HENT:  Mouth/Throat:  Oropharynx is clear and moist. No oropharyngeal exudate.  Neck: Normal range of motion. Neck supple. No thyromegaly present.  Cardiovascular: Normal rate, regular rhythm, normal heart sounds and intact distal pulses.  Exam reveals no gallop.   No murmur heard. Pulmonary/Chest: Effort normal and breath sounds normal. No respiratory distress. He has no wheezes. He has no rales.  Abdominal: Soft. There is no tenderness.  Musculoskeletal: He exhibits no edema or tenderness.  Lymphadenopathy:    He has no cervical adenopathy.  Skin: No rash noted. No erythema.  Psychiatric: He has a normal mood and affect. His behavior is normal.          Assessment & Plan:

## 2015-01-14 NOTE — Progress Notes (Signed)
Pre visit review using our clinic review tool, if applicable. No additional management support is needed unless otherwise documented below in the visit note. 

## 2015-01-14 NOTE — Assessment & Plan Note (Signed)
See social history 

## 2015-01-21 ENCOUNTER — Telehealth: Payer: Self-pay | Admitting: *Deleted

## 2015-01-21 NOTE — Telephone Encounter (Signed)
"  I want to postpone my surgery scheduled for 01/31/2015."  When would you like to reschedule?  "I don't want to reschedule at this time.  I will call back later to reschedule."  I will let Dr. Milinda Pointer know as well as the surgical center.  I called and left a message for Joseph Art at [facility] asking her to cancel surgery for 01/31/2015.

## 2015-01-21 NOTE — Telephone Encounter (Signed)
ok 

## 2015-01-31 ENCOUNTER — Encounter: Payer: Self-pay | Admitting: Internal Medicine

## 2015-03-06 DIAGNOSIS — Z08 Encounter for follow-up examination after completed treatment for malignant neoplasm: Secondary | ICD-10-CM | POA: Diagnosis not present

## 2015-03-06 DIAGNOSIS — Z85828 Personal history of other malignant neoplasm of skin: Secondary | ICD-10-CM | POA: Diagnosis not present

## 2015-03-06 DIAGNOSIS — D225 Melanocytic nevi of trunk: Secondary | ICD-10-CM | POA: Diagnosis not present

## 2015-03-06 DIAGNOSIS — X32XXXA Exposure to sunlight, initial encounter: Secondary | ICD-10-CM | POA: Diagnosis not present

## 2015-03-06 DIAGNOSIS — L570 Actinic keratosis: Secondary | ICD-10-CM | POA: Diagnosis not present

## 2015-05-16 ENCOUNTER — Ambulatory Visit (INDEPENDENT_AMBULATORY_CARE_PROVIDER_SITE_OTHER): Payer: PPO | Admitting: Internal Medicine

## 2015-05-16 ENCOUNTER — Encounter: Payer: Self-pay | Admitting: Internal Medicine

## 2015-05-16 VITALS — BP 102/80 | HR 84 | Temp 97.6°F | Wt 195.0 lb

## 2015-05-16 DIAGNOSIS — K2100 Gastro-esophageal reflux disease with esophagitis, without bleeding: Secondary | ICD-10-CM

## 2015-05-16 DIAGNOSIS — N400 Enlarged prostate without lower urinary tract symptoms: Secondary | ICD-10-CM

## 2015-05-16 MED ORDER — TADALAFIL 5 MG PO TABS: 5.0000 mg | ORAL_TABLET | Freq: Every day | ORAL | Status: DC

## 2015-05-16 NOTE — Progress Notes (Signed)
Subjective:    Patient ID: Randall Reeves, male    DOB: 08/09/1945, 70 y.o.   MRN: [id number]  HPI Here due to heartburn issues  He notes that he has been taking the omeprazole for years Color of tablet changed some time ago--gets at [facility] uncomfortable stools a while back --- "burny". Though it might be related to the different omeprazole (purple now and grape like flavor)  Tried probiotic without any help Tried cutting omeprazole in half--[place] have helped a bit Stopped it 2 weeks ago--- and "burny" feeling resolved Acid reflux worsened though Pharmacist recommended ranitidine 150 bid. This has helped but still has symptoms  No swallowing problems No throat symptoms No chronic cough  Current Outpatient Prescriptions on File Prior to Visit  Medication Sig Dispense Refill  . aspirin 81 MG chewable tablet Chew 81 mg by mouth daily.    Marland Kitchen losartan (COZAAR) 50 MG tablet take 1 tablet by mouth once daily 90 tablet 3  . Multiple Vitamin (MULTIVITAMIN) tablet Take 1 tablet by mouth daily.     . tadalafil (CIALIS) 5 MG tablet Take 5 mg by mouth daily as needed for erectile dysfunction.     No current facility-administered medications on file prior to visit.    Allergies  Allergen Reactions  . Bisoprolol-Hydrochlorothiazide     REACTION: muscle pain, cramps  . Codeine Nausea Only    Past Medical History  Diagnosis Date  . Allergy   . GERD (gastroesophageal reflux disease)   . Hypertension   . Hyperlipidemia   . Obstructive sleep apnea   . Coronary atherosclerosis of unspecified type of vessel, native or graft 2011    MI but cath benign. Dr Otis Brace    Past Surgical History  Procedure Laterality Date  . Spermatocele repair    . Cardiac catheterization    . Carpal tunnel release      Family History  Problem Relation Age of Onset  . Stroke Mother   . Hypertension Mother   . Diabetes Father   . Asthma Father   . Heart failure Father   . Heart disease Father   .  Hypertension Sister   . Diabetes Sister   . Neuropathy Brother   . Hypertension Brother   . Cancer Brother   . Diabetes Brother   . Heart disease Brother   . Hypertension Brother   . Hypertension Brother   . Hypertension Brother   . Cancer Brother     prostate    Social History   Social History  . Marital Status: Married    Spouse Name: N/A  . Number of Children: 2  . Years of Education: N/A   Occupational History  . landscaping    Social History Main Topics  . Smoking status: Never Smoker   . Smokeless tobacco: Never Used  . Alcohol Use: 1.8 - 3.0 oz/week    3-5 Cans of beer per week     Comment: a beer a day  . Drug Use: No  . Sexual Activity: Not on file   Other Topics Concern  . Not on file   Social History Narrative   Has living will   Wife is his health care POA.   Would accept resuscitation attempts.   Would leave feeding tube decision to his wife   Review of Systems  Does have post nasal drip this time of year--has to spit that out frequently (burns in throat)--this seems worse since off omeprazole Appetite is fine  Objective:   Physical Exam  Constitutional: He appears well-developed and well-nourished. No distress.  HENT:  Mouth/Throat: Oropharynx is clear and moist. No oropharyngeal exudate.  Pulmonary/Chest: Effort normal and breath sounds normal. No respiratory distress. He has no wheezes. He has no rales.  Abdominal: Soft. He exhibits no distension. There is no tenderness. There is no rebound and no guarding.          Assessment & Plan:

## 2015-05-16 NOTE — Assessment & Plan Note (Signed)
Has done well long time with omeprazole but had rectal side effects with new manufacturer. Failed attempt with ranitidine Will go back to omeprazole--- Primiano want to just take the brand prilosec

## 2015-05-16 NOTE — Patient Instructions (Signed)
Please take the brand prilosec---or inspect any generic to be sure that it isn't the objectionable purple one.

## 2015-05-16 NOTE — Assessment & Plan Note (Signed)
He is having ongoing symptoms Didn't tolerate tamsulosin Has noticed tremendous improvement with cialis ----will go ahead and start daily Rx

## 2015-05-16 NOTE — Progress Notes (Signed)
Pre visit review using our clinic review tool, if applicable. No additional management support is needed unless otherwise documented below in the visit note. 

## 2015-05-19 ENCOUNTER — Telehealth: Payer: Self-pay

## 2015-05-19 NOTE — Telephone Encounter (Signed)
Received a fax stating his Cialis 5mg  has been approved for the lifetime of his plan. Faxed info to pharmacy

## 2015-05-21 ENCOUNTER — Telehealth: Payer: Self-pay | Admitting: Podiatry

## 2015-05-21 NOTE — Telephone Encounter (Signed)
05/21/2015- patients wife came in and returned the Airwalker boot that was dispensed to him for surgery. He cancelled surgery, and would like [place] to refund the insurance company any money paid for this boot, as well as remove the balance owed for the boot that is showing as patient liability. The boot was unopened/ unused.  Thank you !

## 2015-07-11 DIAGNOSIS — H40003 Preglaucoma, unspecified, bilateral: Secondary | ICD-10-CM | POA: Diagnosis not present

## 2015-08-04 ENCOUNTER — Other Ambulatory Visit: Payer: Self-pay | Admitting: Internal Medicine

## 2015-08-05 ENCOUNTER — Other Ambulatory Visit: Payer: Self-pay

## 2015-08-05 MED ORDER — TADALAFIL 5 MG PO TABS: 5.0000 mg | ORAL_TABLET | Freq: Every day | ORAL | Status: DC

## 2015-08-05 NOTE — Telephone Encounter (Signed)
Approved: okay #[id number] x 3

## 2015-08-05 NOTE — Telephone Encounter (Signed)
[name] (DPR signed) left v/m requesting cialis to be sent to [facility] [address] with new quantity; presently pt getting # 50 and pt can get # 90 for the same cost. [name] request cb.Please advise. Have not changed quantity until approved by Dr Silvio Pate.

## 2015-09-01 ENCOUNTER — Ambulatory Visit (INDEPENDENT_AMBULATORY_CARE_PROVIDER_SITE_OTHER): Payer: PPO | Admitting: Internal Medicine

## 2015-09-01 ENCOUNTER — Encounter: Payer: Self-pay | Admitting: Internal Medicine

## 2015-09-01 DIAGNOSIS — K6289 Other specified diseases of anus and rectum: Secondary | ICD-10-CM | POA: Diagnosis not present

## 2015-09-01 DIAGNOSIS — R208 Other disturbances of skin sensation: Secondary | ICD-10-CM | POA: Insufficient documentation

## 2015-09-01 MED ORDER — HYDROCORTISONE 2.5 % EX CREA: TOPICAL_CREAM | Freq: Three times a day (TID) | CUTANEOUS | 3 refills | Status: DC | PRN

## 2015-09-01 NOTE — Progress Notes (Signed)
Pre visit review using our clinic review tool, if applicable. No additional management support is needed unless otherwise documented below in the visit note. 

## 2015-09-01 NOTE — Progress Notes (Signed)
Subjective:    Patient ID: Randall Reeves, male    DOB: 29-Jan-1945, 70 y.o.   MRN: [id number]  HPI Here due to anal itching He had thought his omeprazole was causing some irritation--burning with stools This goes back for months  Tried preparation H--no help (cream and suppository) Changed omeprazole maker---- seemed to get better briefly (but no progressively worsening) Tried probiotic--no help aquafor and vagisil wash no help Tried medicated powder--- made it worse  Has itching and burning frequently--not even with BM Will even awaken him at night  Has had some blood after increasing soreness (just when wiping)  Current Outpatient Prescriptions on File Prior to Visit  Medication Sig Dispense Refill  . aspirin 81 MG chewable tablet Chew 81 mg by mouth daily.    Marland Kitchen losartan (COZAAR) 50 MG tablet take 1 tablet by mouth once daily 90 tablet 1  . Multiple Vitamin (MULTIVITAMIN) tablet Take 1 tablet by mouth daily.     Marland Kitchen omeprazole (PRILOSEC) 20 MG capsule Take 20 mg by mouth daily.    . tadalafil (CIALIS) 5 MG tablet Take 1 tablet (5 mg total) by mouth daily. 90 tablet 3   No current facility-administered medications on file prior to visit.     Allergies  Allergen Reactions  . Bisoprolol-Hydrochlorothiazide     REACTION: muscle pain, cramps  . Codeine Nausea Only    Past Medical History:  Diagnosis Date  . Allergy   . Coronary atherosclerosis of unspecified type of vessel, native or graft 2011   MI but cath benign. Dr Otis Brace  . GERD (gastroesophageal reflux disease)   . Hyperlipidemia   . Hypertension   . Obstructive sleep apnea     Past Surgical History:  Procedure Laterality Date  . CARDIAC CATHETERIZATION    . CARPAL TUNNEL RELEASE    . spermatocele repair      Family History  Problem Relation Age of Onset  . Stroke Mother   . Hypertension Mother   . Diabetes Father   . Asthma Father   . Heart failure Father   . Heart disease Father   . Hypertension  Sister   . Diabetes Sister   . Neuropathy Brother   . Hypertension Brother   . Cancer Brother   . Diabetes Brother   . Heart disease Brother   . Hypertension Brother   . Hypertension Brother   . Hypertension Brother   . Cancer Brother     prostate    Social History   Social History  . Marital status: Married    Spouse name: N/A  . Number of children: 2  . Years of education: N/A   Occupational History  . landscaping    Social History Main Topics  . Smoking status: Never Smoker  . Smokeless tobacco: Never Used  . Alcohol use 1.8 - 3.0 oz/week    3 - 5 Cans of beer per week     Comment: a beer a day  . Drug use: No  . Sexual activity: Not on file   Other Topics Concern  . Not on file   Social History Narrative   Has living will   Wife is his health care POA.   Would accept resuscitation attempts.   Would leave feeding tube decision to his wife   Review of Systems Voiding better with the cialis--much less urgency Bowels regular---every morning    Objective:   Physical Exam  Genitourinary:  Genitourinary Comments: Perianal inflammation--circumferential No sig hemorrhoids Rectal  shows normal prostate, no mass and brown heme negative stool          Assessment & Plan:

## 2015-09-01 NOTE — Assessment & Plan Note (Signed)
Looks topical only No fistula No sign of internal problems Will try 2.5% hydrocortisone Consider lotrisone if that doesn't help

## 2015-09-01 NOTE — Patient Instructions (Signed)
Let me know if you are not mostly, or all, better within 2 weeks. Use the witch hazel wipes instead of toilet paper until it is no longer painful.

## 2015-11-13 DIAGNOSIS — D2271 Melanocytic nevi of right lower limb, including hip: Secondary | ICD-10-CM | POA: Diagnosis not present

## 2015-11-13 DIAGNOSIS — L570 Actinic keratosis: Secondary | ICD-10-CM | POA: Diagnosis not present

## 2015-11-13 DIAGNOSIS — Z85828 Personal history of other malignant neoplasm of skin: Secondary | ICD-10-CM | POA: Diagnosis not present

## 2015-11-13 DIAGNOSIS — D2262 Melanocytic nevi of left upper limb, including shoulder: Secondary | ICD-10-CM | POA: Diagnosis not present

## 2015-11-13 DIAGNOSIS — X32XXXA Exposure to sunlight, initial encounter: Secondary | ICD-10-CM | POA: Diagnosis not present

## 2015-11-13 DIAGNOSIS — D225 Melanocytic nevi of trunk: Secondary | ICD-10-CM | POA: Diagnosis not present

## 2015-12-27 DIAGNOSIS — S098XXA Other specified injuries of head, initial encounter: Secondary | ICD-10-CM | POA: Diagnosis not present

## 2015-12-27 DIAGNOSIS — S0181XA Laceration without foreign body of other part of head, initial encounter: Secondary | ICD-10-CM | POA: Diagnosis not present

## 2015-12-28 ENCOUNTER — Encounter (HOSPITAL_COMMUNITY): Payer: Self-pay | Admitting: Emergency Medicine

## 2015-12-28 ENCOUNTER — Observation Stay (HOSPITAL_COMMUNITY)
Admission: EM | Admit: 2015-12-28 | Discharge: 2015-12-29 | Disposition: A | Discharge status: Home / Self Care | Payer: PPO | Attending: Internal Medicine | Admitting: Internal Medicine

## 2015-12-28 ENCOUNTER — Emergency Department (HOSPITAL_COMMUNITY): Payer: PPO

## 2015-12-28 ENCOUNTER — Observation Stay (HOSPITAL_COMMUNITY): Payer: PPO

## 2015-12-28 DIAGNOSIS — G4733 Obstructive sleep apnea (adult) (pediatric): Secondary | ICD-10-CM | POA: Insufficient documentation

## 2015-12-28 DIAGNOSIS — R4701 Aphasia: Secondary | ICD-10-CM | POA: Diagnosis present

## 2015-12-28 DIAGNOSIS — I2510 Atherosclerotic heart disease of native coronary artery without angina pectoris: Secondary | ICD-10-CM | POA: Diagnosis not present

## 2015-12-28 DIAGNOSIS — G458 Other transient cerebral ischemic attacks and related syndromes: Secondary | ICD-10-CM | POA: Diagnosis not present

## 2015-12-28 DIAGNOSIS — R410 Disorientation, unspecified: Secondary | ICD-10-CM | POA: Diagnosis not present

## 2015-12-28 DIAGNOSIS — Y998 Other external cause status: Secondary | ICD-10-CM | POA: Diagnosis not present

## 2015-12-28 DIAGNOSIS — N529 Male erectile dysfunction, unspecified: Secondary | ICD-10-CM | POA: Diagnosis not present

## 2015-12-28 DIAGNOSIS — E785 Hyperlipidemia, unspecified: Secondary | ICD-10-CM | POA: Diagnosis present

## 2015-12-28 DIAGNOSIS — Z823 Family history of stroke: Secondary | ICD-10-CM | POA: Diagnosis not present

## 2015-12-28 DIAGNOSIS — Z885 Allergy status to narcotic agent status: Secondary | ICD-10-CM | POA: Diagnosis not present

## 2015-12-28 DIAGNOSIS — R55 Syncope and collapse: Secondary | ICD-10-CM | POA: Diagnosis not present

## 2015-12-28 DIAGNOSIS — N1831 Chronic kidney disease, stage 3a: Secondary | ICD-10-CM | POA: Diagnosis present

## 2015-12-28 DIAGNOSIS — I252 Old myocardial infarction: Secondary | ICD-10-CM | POA: Diagnosis not present

## 2015-12-28 DIAGNOSIS — R404 Transient alteration of awareness: Secondary | ICD-10-CM | POA: Diagnosis not present

## 2015-12-28 DIAGNOSIS — Z825 Family history of asthma and other chronic lower respiratory diseases: Secondary | ICD-10-CM | POA: Diagnosis not present

## 2015-12-28 DIAGNOSIS — W19XXXA Unspecified fall, initial encounter: Secondary | ICD-10-CM | POA: Diagnosis not present

## 2015-12-28 DIAGNOSIS — Z833 Family history of diabetes mellitus: Secondary | ICD-10-CM | POA: Diagnosis not present

## 2015-12-28 DIAGNOSIS — Z888 Allergy status to other drugs, medicaments and biological substances status: Secondary | ICD-10-CM | POA: Insufficient documentation

## 2015-12-28 DIAGNOSIS — N183 Chronic kidney disease, stage 3 (moderate): Secondary | ICD-10-CM | POA: Insufficient documentation

## 2015-12-28 DIAGNOSIS — I672 Cerebral atherosclerosis: Secondary | ICD-10-CM | POA: Insufficient documentation

## 2015-12-28 DIAGNOSIS — G459 Transient cerebral ischemic attack, unspecified: Secondary | ICD-10-CM | POA: Diagnosis not present

## 2015-12-28 DIAGNOSIS — Y92002 Bathroom of unspecified non-institutional (private) residence single-family (private) house as the place of occurrence of the external cause: Secondary | ICD-10-CM | POA: Diagnosis not present

## 2015-12-28 DIAGNOSIS — G3189 Other specified degenerative diseases of nervous system: Secondary | ICD-10-CM | POA: Insufficient documentation

## 2015-12-28 DIAGNOSIS — M47812 Spondylosis without myelopathy or radiculopathy, cervical region: Secondary | ICD-10-CM | POA: Diagnosis not present

## 2015-12-28 DIAGNOSIS — I129 Hypertensive chronic kidney disease with stage 1 through stage 4 chronic kidney disease, or unspecified chronic kidney disease: Secondary | ICD-10-CM | POA: Insufficient documentation

## 2015-12-28 DIAGNOSIS — Y93E1 Activity, personal bathing and showering: Secondary | ICD-10-CM | POA: Diagnosis not present

## 2015-12-28 DIAGNOSIS — I728 Aneurysm of other specified arteries: Secondary | ICD-10-CM | POA: Diagnosis not present

## 2015-12-28 DIAGNOSIS — Z8249 Family history of ischemic heart disease and other diseases of the circulatory system: Secondary | ICD-10-CM | POA: Insufficient documentation

## 2015-12-28 DIAGNOSIS — S0990XA Unspecified injury of head, initial encounter: Secondary | ICD-10-CM | POA: Diagnosis not present

## 2015-12-28 DIAGNOSIS — Z8042 Family history of malignant neoplasm of prostate: Secondary | ICD-10-CM | POA: Insufficient documentation

## 2015-12-28 DIAGNOSIS — I10 Essential (primary) hypertension: Secondary | ICD-10-CM | POA: Diagnosis present

## 2015-12-28 DIAGNOSIS — W010XXA Fall on same level from slipping, tripping and stumbling without subsequent striking against object, initial encounter: Secondary | ICD-10-CM | POA: Insufficient documentation

## 2015-12-28 DIAGNOSIS — K219 Gastro-esophageal reflux disease without esophagitis: Secondary | ICD-10-CM | POA: Diagnosis present

## 2015-12-28 DIAGNOSIS — E871 Hypo-osmolality and hyponatremia: Secondary | ICD-10-CM | POA: Diagnosis not present

## 2015-12-28 DIAGNOSIS — S199XXA Unspecified injury of neck, initial encounter: Secondary | ICD-10-CM | POA: Diagnosis not present

## 2015-12-28 DIAGNOSIS — Z7982 Long term (current) use of aspirin: Secondary | ICD-10-CM | POA: Diagnosis not present

## 2015-12-28 HISTORY — DX: Chronic kidney disease, stage 3 (moderate): N18.3

## 2015-12-28 HISTORY — DX: Chronic kidney disease, stage 3 unspecified: N18.30

## 2015-12-28 LAB — URINALYSIS, ROUTINE W REFLEX MICROSCOPIC
Bilirubin Urine: NEGATIVE
GLUCOSE, UA: NEGATIVE mg/dL
Hgb urine dipstick: NEGATIVE
Ketones, ur: NEGATIVE mg/dL
LEUKOCYTES UA: NEGATIVE
Nitrite: NEGATIVE
PH: 6 (ref 5.0–8.0)
PROTEIN: NEGATIVE mg/dL
Specific Gravity, Urine: 1.019 (ref 1.005–1.030)

## 2015-12-28 LAB — CBC WITH DIFFERENTIAL/PLATELET
Basophils Absolute: 0 10*3/uL (ref 0.0–0.1)
Basophils Relative: 1 %
Eosinophils Absolute: 0.1 10*3/uL (ref 0.0–0.7)
Eosinophils Relative: 2 %
HEMATOCRIT: 42.1 % (ref 39.0–52.0)
HEMOGLOBIN: 14.4 g/dL (ref 13.0–17.0)
Lymphocytes Relative: 24 %
Lymphs Abs: 1.3 10*3/uL (ref 0.7–4.0)
MCH: 31.3 pg (ref 26.0–34.0)
MCHC: 34.2 g/dL (ref 30.0–36.0)
MCV: 91.5 fL (ref 78.0–100.0)
MONO ABS: 0.8 10*3/uL (ref 0.1–1.0)
MONOS PCT: 15 %
NEUTROS ABS: 3.1 10*3/uL (ref 1.7–7.7)
Neutrophils Relative %: 58 %
Platelets: 169 10*3/uL (ref 150–400)
RBC: 4.6 MIL/uL (ref 4.22–5.81)
RDW: 12.7 % (ref 11.5–15.5)
WBC: 5.3 10*3/uL (ref 4.0–10.5)

## 2015-12-28 LAB — BASIC METABOLIC PANEL
Anion gap: 9 (ref 5–15)
BUN: 28 mg/dL — AB (ref 6–20)
CO2: 23 mmol/L (ref 22–32)
CREATININE: 1.34 mg/dL — AB (ref 0.61–1.24)
Calcium: 9.1 mg/dL (ref 8.9–10.3)
Chloride: 104 mmol/L (ref 101–111)
GFR calc Af Amer: >60 mL/min (ref >60)
GFR, EST NON AFRICAN AMERICAN: 53 mL/min — AB (ref >60)
Glucose, Bld: 138 mg/dL — ABNORMAL HIGH (ref 65–99)
Potassium: 4.1 mmol/L (ref 3.5–5.1)
SODIUM: 136 mmol/L (ref 135–145)

## 2015-12-28 LAB — ETHANOL: Alcohol, Ethyl (B): <5 mg/dL (ref <5)

## 2015-12-28 LAB — RAPID URINE DRUG SCREEN, HOSP PERFORMED
Amphetamines: NOT DETECTED
BARBITURATES: NOT DETECTED
Benzodiazepines: NOT DETECTED
COCAINE: NOT DETECTED
OPIATES: NOT DETECTED
Tetrahydrocannabinol: NOT DETECTED

## 2015-12-28 LAB — CBC
HCT: 42 % (ref 39.0–52.0)
Hemoglobin: 14.3 g/dL (ref 13.0–17.0)
MCH: 31.1 pg (ref 26.0–34.0)
MCHC: 34 g/dL (ref 30.0–36.0)
MCV: 91.3 fL (ref 78.0–100.0)
PLATELETS: 215 10*3/uL (ref 150–400)
RBC: 4.6 MIL/uL (ref 4.22–5.81)
RDW: 12.5 % (ref 11.5–15.5)
WBC: 6.9 10*3/uL (ref 4.0–10.5)

## 2015-12-28 LAB — LIPID PANEL
Cholesterol: 191 mg/dL (ref 0–200)
HDL: 43 mg/dL (ref >40)
LDL Cholesterol: 108 mg/dL — ABNORMAL HIGH (ref 0–99)
Total CHOL/HDL Ratio: 4.4 RATIO
Triglycerides: 199 mg/dL — ABNORMAL HIGH (ref <150)
VLDL: 40 mg/dL (ref 0–40)

## 2015-12-28 LAB — COMPREHENSIVE METABOLIC PANEL
ALK PHOS: 64 U/L (ref 38–126)
ALT: 21 U/L (ref 17–63)
AST: 24 U/L (ref 15–41)
Albumin: 3.8 g/dL (ref 3.5–5.0)
Anion gap: 10 (ref 5–15)
BILIRUBIN TOTAL: 0.5 mg/dL (ref 0.3–1.2)
BUN: 31 mg/dL — AB (ref 6–20)
CO2: 23 mmol/L (ref 22–32)
CREATININE: 1.45 mg/dL — AB (ref 0.61–1.24)
Calcium: 9 mg/dL (ref 8.9–10.3)
Chloride: 99 mmol/L — ABNORMAL LOW (ref 101–111)
GFR calc Af Amer: 56 mL/min — ABNORMAL LOW (ref >60)
GFR, EST NON AFRICAN AMERICAN: 48 mL/min — AB (ref >60)
Glucose, Bld: 115 mg/dL — ABNORMAL HIGH (ref 65–99)
POTASSIUM: 4 mmol/L (ref 3.5–5.1)
Sodium: 132 mmol/L — ABNORMAL LOW (ref 135–145)
TOTAL PROTEIN: 6.6 g/dL (ref 6.5–8.1)

## 2015-12-28 LAB — I-STAT TROPONIN, ED: Troponin i, poc: 0 ng/mL (ref 0.00–0.08)

## 2015-12-28 LAB — PROTIME-INR
INR: 0.98
Prothrombin Time: 13 seconds (ref 11.4–15.2)

## 2015-12-28 LAB — VITAMIN B12: Vitamin B-12: 723 pg/mL (ref 180–914)

## 2015-12-28 LAB — MRSA PCR SCREENING: MRSA BY PCR: NEGATIVE

## 2015-12-28 LAB — TSH: TSH: 2.983 u[IU]/mL (ref 0.350–4.500)

## 2015-12-28 MED ORDER — ONDANSETRON HCL 4 MG/2ML IJ SOLN: 4.0000 mg | Freq: Four times a day (QID) | INTRAMUSCULAR | Status: DC | PRN

## 2015-12-28 MED ORDER — ASPIRIN 325 MG PO TABS: 325.0000 mg | ORAL_TABLET | Freq: Every day | ORAL | Status: DC

## 2015-12-28 MED ORDER — ACETAMINOPHEN 325 MG PO TABS: 650.0000 mg | ORAL_TABLET | Freq: Four times a day (QID) | ORAL | Status: DC | PRN

## 2015-12-28 MED ORDER — HYDRALAZINE HCL 20 MG/ML IJ SOLN: 5.0000 mg | INTRAMUSCULAR | Status: DC | PRN

## 2015-12-28 MED ORDER — ENOXAPARIN SODIUM 40 MG/0.4ML ~~LOC~~ SOLN
40.0000 mg | SUBCUTANEOUS | Status: DC
  Administered 2015-12-28: 40 mg via SUBCUTANEOUS
  Filled 2015-12-28: qty 0.4

## 2015-12-28 MED ORDER — LUTEIN-ZEAXANTHIN 20-0.8 MG PO CAPS: 1.0000 | ORAL_CAPSULE | Freq: Every day | ORAL | Status: DC

## 2015-12-28 MED ORDER — PANTOPRAZOLE SODIUM 40 MG PO TBEC
40.0000 mg | DELAYED_RELEASE_TABLET | Freq: Every day | ORAL | Status: DC
  Administered 2015-12-28 – 2015-12-29 (×2): 40 mg via ORAL
  Filled 2015-12-28 (×2): qty 1

## 2015-12-28 MED ORDER — ASPIRIN 300 MG RE SUPP: 300.0000 mg | Freq: Every day | RECTAL | Status: DC

## 2015-12-28 MED ORDER — ASPIRIN 81 MG PO CHEW: 81.0000 mg | CHEWABLE_TABLET | Freq: Every day | ORAL | Status: DC

## 2015-12-28 MED ORDER — ONDANSETRON HCL 4 MG PO TABS: 4.0000 mg | ORAL_TABLET | Freq: Four times a day (QID) | ORAL | Status: DC | PRN

## 2015-12-28 MED ORDER — ADULT MULTIVITAMIN W/MINERALS CH
1.0000 | ORAL_TABLET | Freq: Every day | ORAL | Status: DC
  Administered 2015-12-28 – 2015-12-29 (×2): 1 via ORAL
  Filled 2015-12-28 (×2): qty 1

## 2015-12-28 MED ORDER — ATORVASTATIN CALCIUM 40 MG PO TABS
40.0000 mg | ORAL_TABLET | Freq: Every day | ORAL | Status: DC
  Administered 2015-12-28: 40 mg via ORAL
  Filled 2015-12-28: qty 1

## 2015-12-28 MED ORDER — SODIUM CHLORIDE 0.9% FLUSH
3.0000 mL | Freq: Two times a day (BID) | INTRAVENOUS | Status: DC
  Administered 2015-12-28 – 2015-12-29 (×3): 3 mL via INTRAVENOUS

## 2015-12-28 MED ORDER — ATORVASTATIN CALCIUM 40 MG PO TABS
40.0000 mg | ORAL_TABLET | Freq: Every day | ORAL | Status: DC
  Filled 2015-12-28: qty 1

## 2015-12-28 MED ORDER — ASPIRIN EC 81 MG PO TBEC
81.0000 mg | DELAYED_RELEASE_TABLET | Freq: Every day | ORAL | Status: DC
  Administered 2015-12-28 – 2015-12-29 (×2): 81 mg via ORAL
  Filled 2015-12-28 (×2): qty 1

## 2015-12-28 MED ORDER — SODIUM CHLORIDE 0.9 % IV SOLN
INTRAVENOUS | Status: DC
  Administered 2015-12-28: 06:00:00 via INTRAVENOUS

## 2015-12-28 MED ORDER — STROKE: EARLY STAGES OF RECOVERY BOOK
Freq: Once | Status: AC
  Administered 2015-12-28: 06:00:00
  Filled 2015-12-28: qty 1

## 2015-12-28 MED ORDER — ZOLPIDEM TARTRATE 5 MG PO TABS: 5.0000 mg | ORAL_TABLET | Freq: Every evening | ORAL | Status: DC | PRN

## 2015-12-28 MED ORDER — TADALAFIL 5 MG PO TABS
5.0000 mg | ORAL_TABLET | Freq: Every day | ORAL | Status: DC
Start: 2015-12-28 — End: 2015-12-28

## 2015-12-28 MED ORDER — LOSARTAN POTASSIUM 50 MG PO TABS: 50.0000 mg | ORAL_TABLET | Freq: Every day | ORAL | Status: DC

## 2015-12-28 MED ORDER — ACETAMINOPHEN 650 MG RE SUPP: 650.0000 mg | Freq: Four times a day (QID) | RECTAL | Status: DC | PRN

## 2015-12-28 NOTE — ED Notes (Signed)
Pt was found down at home face down after family heard a thud. Family stated patient was unresponsive and not himself. Pt is A+O x4 no weakness or deficits. Admitting for observation. Family went home and will return in the morning.

## 2015-12-28 NOTE — ED Triage Notes (Signed)
2315 went to restroom. Family heard a bang, found him face down with a snoreous resp. Still altered per family and fire. Hematoma to forehead

## 2015-12-28 NOTE — Progress Notes (Signed)
PHARMACIST - PHYSICIAN ORDER COMMUNICATION  CONCERNING: P&T Medication Policy on Herbal Medications  DESCRIPTION:  This patient's order for: lutein zeaxanthin  has been noted.  This product(s) is classified as an "herbal" or natural product. Due to a lack of definitive safety studies or FDA approval, nonstandard manufacturing practices, plus the potential risk of unknown drug-drug interactions while on inpatient medications, the Pharmacy and Therapeutics Committee does not permit the use of "herbal" or natural products of this type within [facility].   ACTION TAKEN: The pharmacy department is unable to verify this order at this time and your patient has been informed of this safety policy. Please reevaluate patient's clinical condition at discharge and address if the herbal or natural product(s) should be resumed at that time.  Sherlon Handing, PharmD, BCPS Clinical pharmacist, pager [phone] 12/28/2015 4:23 AM

## 2015-12-28 NOTE — Consult Note (Signed)
Admission H&P    Chief Complaint: altered mental status with possible syncopal episode.  HPI: Randall Reeves is an 70 y.o. male history of hypertension, hyperlipidemia, coronary artery disease and chronic kidney disease, brought to the ED following a fall at home and subsequent confusion. Patient reportedly was in his bathroom when he fell. His wife heard him fall, and when she found him he was about 25 feet into the bedroom, but face down on the floor. He appeared to be unable to speak. He could understand what was being said to him. He does not recall hitting the floor nor crawling from the bathroom. CT scan of his head showed no acute abnormality. There was no report of facial droop nor extremity weakness.  Past Medical History:  Diagnosis Date  . Allergy   . CKD (chronic kidney disease), stage III   . Coronary atherosclerosis of unspecified type of vessel, native or graft 2011   MI but cath benign. Dr Otis Brace  . GERD (gastroesophageal reflux disease)   . Hyperlipidemia   . Hypertension   . Obstructive sleep apnea     Past Surgical History:  Procedure Laterality Date  . CARDIAC CATHETERIZATION    . CARPAL TUNNEL RELEASE    . spermatocele repair      Family History  Problem Relation Age of Onset  . Stroke Mother   . Hypertension Mother   . Diabetes Father   . Asthma Father   . Heart failure Father   . Heart disease Father   . Hypertension Sister   . Diabetes Sister   . Neuropathy Brother   . Hypertension Brother   . Cancer Brother   . Diabetes Brother   . Heart disease Brother   . Hypertension Brother   . Hypertension Brother   . Hypertension Brother   . Cancer Brother     prostate   Social History:  reports that he has never smoked. He has never used smokeless tobacco. He reports that he drinks about 1.8 - 3.0 oz of alcohol per week . He reports that he does not use drugs.  Allergies:  Allergies  Allergen Reactions  . Bisoprolol-Hydrochlorothiazide     REACTION:  muscle pain, cramps  . Codeine Nausea Only    Medications: Preadmission medications were reviewed by me.  ROS: History obtained from chart review and the patient  General ROS: negative for - chills, fatigue, fever, night sweats, weight gain or weight loss Psychological ROS: negative for - behavioral disorder, hallucinations, memory difficulties, mood swings or suicidal ideation Ophthalmic ROS: negative for - blurry vision, double vision, eye pain or loss of vision ENT ROS: negative for - epistaxis, nasal discharge, oral lesions, sore throat, tinnitus or vertigo Allergy and Immunology ROS: negative for - hives or itchy/watery eyes Hematological and Lymphatic ROS: negative for - bleeding problems, bruising or swollen lymph nodes Endocrine ROS: negative for - galactorrhea, hair pattern changes, polydipsia/polyuria or temperature intolerance Respiratory ROS: negative for - cough, hemoptysis, shortness of breath or wheezing Cardiovascular ROS: negative for - chest pain, dyspnea on exertion, edema or irregular heartbeat Gastrointestinal ROS: negative for - abdominal pain, diarrhea, hematemesis, nausea/vomiting or stool incontinence Genito-Urinary ROS: negative for - dysuria, hematuria, incontinence or urinary frequency/urgency Musculoskeletal ROS: negative for - joint swelling or muscular weakness Neurological ROS: as noted in HPI Dermatological ROS: negative for rash and skin lesion changes  Physical Examination: Blood pressure 121/86, pulse 87, temperature 97.8 F (36.6 C), temperature source Oral, resp. rate 16, SpO2 95 %.  HEENT-  Normocephalic, no lesions, without obvious abnormality.  Normal external eye and conjunctiva.  Normal TM's bilaterally.  Normal auditory canals and external ears. Normal external nose, mucus membranes and septum.  Normal pharynx. Neck supple with no masses, nodes, nodules or enlargement. Cardiovascular - regular rate and rhythm, S1, S2 normal, no murmur, click,  rub or gallop Lungs - chest clear, no wheezing, rales, normal symmetric air entry Abdomen - soft, non-tender; bowel sounds normal; no masses,  no organomegaly Extremities - no joint deformities, effusion, or inflammation and no edema  Neurologic Examination: Mental Status: Alert, oriented, no acute distress.  Speech fluent without evidence of aphasia. Able to follow commands without difficulty. Cranial Nerves: II-Visual fields were normal. III/IV/VI-Pupils were equal and reacted normally to light. Extraocular movements were full and conjugate.    V/VII-no facial numbness and no facial weakness. VIII-normal. X-normal speech and symmetrical palatal movement. XI: trapezius strength/neck flexion strength normal bilaterally XII-midline tongue extension with normal strength. Motor: 5/5 bilaterally with normal tone and bulk Sensory: Normal throughout. Deep Tendon Reflexes: 1+ and symmetric. Plantars: mute bilaterally Cerebellar: Normal finger-to-nose testing. Carotid auscultation: Normal  Results for orders placed or performed during the hospital encounter of 12/28/15 (from the past 48 hour(s))  CBC with Differential     Status: None   Collection Time: 12/28/15 12:35 AM  Result Value Ref Range   WBC 5.3 4.0 - 10.5 K/uL   RBC 4.60 4.22 - 5.81 MIL/uL   Hemoglobin 14.4 13.0 - 17.0 g/dL   HCT 42.1 39.0 - 52.0 %   MCV 91.5 78.0 - 100.0 fL   MCH 31.3 26.0 - 34.0 pg   MCHC 34.2 30.0 - 36.0 g/dL   RDW 12.7 11.5 - 15.5 %   Platelets 169 150 - 400 K/uL   Neutrophils Relative % 58 %   Neutro Abs 3.1 1.7 - 7.7 K/uL   Lymphocytes Relative 24 %   Lymphs Abs 1.3 0.7 - 4.0 K/uL   Monocytes Relative 15 %   Monocytes Absolute 0.8 0.1 - 1.0 K/uL   Eosinophils Relative 2 %   Eosinophils Absolute 0.1 0.0 - 0.7 K/uL   Basophils Relative 1 %   Basophils Absolute 0.0 0.0 - 0.1 K/uL  Comprehensive metabolic panel     Status: Abnormal   Collection Time: 12/28/15 12:35 AM  Result Value Ref Range    Sodium 132 (L) 135 - 145 mmol/L   Potassium 4.0 3.5 - 5.1 mmol/L   Chloride 99 (L) 101 - 111 mmol/L   CO2 23 22 - 32 mmol/L   Glucose, Bld 115 (H) 65 - 99 mg/dL   BUN 31 (H) 6 - 20 mg/dL   Creatinine, Ser 1.45 (H) 0.61 - 1.24 mg/dL   Calcium 9.0 8.9 - 10.3 mg/dL   Total Protein 6.6 6.5 - 8.1 g/dL   Albumin 3.8 3.5 - 5.0 g/dL   AST 24 15 - 41 U/L   ALT 21 17 - 63 U/L   Alkaline Phosphatase 64 38 - 126 U/L   Total Bilirubin 0.5 0.3 - 1.2 mg/dL   GFR calc non Af Amer 48 (L) >60 mL/min   GFR calc Af Amer 56 (L) >60 mL/min    Comment: (NOTE) The eGFR has been calculated using the CKD EPI equation. This calculation has not been validated in all clinical situations. eGFR's persistently <60 mL/min signify possible Chronic Kidney Disease.    Anion gap 10 5 - 15  Ethanol     Status: None  Collection Time: 12/28/15 12:35 AM  Result Value Ref Range   Alcohol, Ethyl (B) <5 <5 mg/dL    Comment:        LOWEST DETECTABLE LIMIT FOR SERUM ALCOHOL IS 5 mg/dL FOR MEDICAL PURPOSES ONLY   I-Stat Troponin, ED (not at [facility])     Status: None   Collection Time: 12/28/15 12:39 AM  Result Value Ref Range   Troponin i, poc 0.00 0.00 - 0.08 ng/mL   Comment 3            Comment: Due to the release kinetics of cTnI, a negative result within the first hours of the onset of symptoms does not rule out myocardial infarction with certainty. If myocardial infarction is still suspected, repeat the test at appropriate intervals.   Rapid urine drug screen (hospital performed)     Status: None   Collection Time: 12/28/15  2:09 AM  Result Value Ref Range   Opiates NONE DETECTED NONE DETECTED   Cocaine NONE DETECTED NONE DETECTED   Benzodiazepines NONE DETECTED NONE DETECTED   Amphetamines NONE DETECTED NONE DETECTED   Tetrahydrocannabinol NONE DETECTED NONE DETECTED   Barbiturates NONE DETECTED NONE DETECTED    Comment:        DRUG SCREEN FOR MEDICAL PURPOSES ONLY.  IF CONFIRMATION IS NEEDED FOR ANY  PURPOSE, NOTIFY LAB WITHIN 5 DAYS.        LOWEST DETECTABLE LIMITS FOR URINE DRUG SCREEN Drug Class       Cutoff (ng/mL) Amphetamine      1000 Barbiturate      200 Benzodiazepine   259 Tricyclics       563 Opiates          300 Cocaine          300 THC              50   Urinalysis, Routine w reflex microscopic (not at [facility])     Status: None   Collection Time: 12/28/15  2:09 AM  Result Value Ref Range   Color, Urine YELLOW YELLOW   APPearance CLEAR CLEAR   Specific Gravity, Urine 1.019 1.005 - 1.030   pH 6.0 5.0 - 8.0   Glucose, UA NEGATIVE NEGATIVE mg/dL   Hgb urine dipstick NEGATIVE NEGATIVE   Bilirubin Urine NEGATIVE NEGATIVE   Ketones, ur NEGATIVE NEGATIVE mg/dL   Protein, ur NEGATIVE NEGATIVE mg/dL   Nitrite NEGATIVE NEGATIVE   Leukocytes, UA NEGATIVE NEGATIVE    Comment: MICROSCOPIC NOT DONE ON URINES WITH NEGATIVE PROTEIN, BLOOD, LEUKOCYTES, NITRITE, OR GLUCOSE <1000 mg/dL.   Ct Head Wo Contrast  Result Date: 12/28/2015 CLINICAL DATA:  Initial evaluation for acute trauma, fall. EXAM: CT HEAD WITHOUT CONTRAST CT CERVICAL SPINE WITHOUT CONTRAST TECHNIQUE: Multidetector CT imaging of the head and cervical spine was performed following the standard protocol without intravenous contrast. Multiplanar CT image reconstructions of the cervical spine were also generated. COMPARISON:  Prior study from 11/24/2012. FINDINGS: CT HEAD FINDINGS Brain: Age-related cerebral atrophy with moderate chronic microvascular ischemic disease. No acute intracranial hemorrhage. No findings to suggest acute large vessel territory infarct. No mass lesion, midline shift, or mass effect. No hydrocephalus. No extra-axial fluid collection. Vascular: No hyperdense vessel. Skull: Scalp soft tissues within normal limits.  Calvarium intact. Sinuses/Orbits: Globes and orbital soft tissues within normal limits. Mild scattered mucosal thickening within the ethmoidal air cells and sphenoid sinuses. Paranasal sinuses are  otherwise clear. No mastoid effusion. CT CERVICAL SPINE FINDINGS Alignment: Vertebral bodies normally aligned with  preservation of the normal cervical lordosis. No listhesis. Skull base and vertebrae: Skullbase intact. Normal C1-2 articulations preserved. Dens is intact. Vertebral body heights maintained. No acute fracture identified. Soft tissues and spinal canal: Visualized soft tissues of the neck demonstrate no acute abnormality. No prevertebral edema. Calcified tonsilliths noted. Disc levels: Moderate degenerative spondylolysis as evidenced by intervertebral disc space narrowing, endplate sclerosis, and osteophytosis present at C5-6 and C6-7. Upper chest: Visualized upper mediastinum within normal limits. Visualized lung apices are clear. IMPRESSION: 1. No acute intracranial process. 2. Age-related cerebral atrophy with moderate chronic microvascular ischemic disease. 3. No acute traumatic injury within the cervical spine. Electronically Signed   By: Jeannine Boga M.D.   On: 12/28/2015 01:38   Ct Cervical Spine Wo Contrast  Result Date: 12/28/2015 CLINICAL DATA:  Initial evaluation for acute trauma, fall. EXAM: CT HEAD WITHOUT CONTRAST CT CERVICAL SPINE WITHOUT CONTRAST TECHNIQUE: Multidetector CT imaging of the head and cervical spine was performed following the standard protocol without intravenous contrast. Multiplanar CT image reconstructions of the cervical spine were also generated. COMPARISON:  Prior study from 11/24/2012. FINDINGS: CT HEAD FINDINGS Brain: Age-related cerebral atrophy with moderate chronic microvascular ischemic disease. No acute intracranial hemorrhage. No findings to suggest acute large vessel territory infarct. No mass lesion, midline shift, or mass effect. No hydrocephalus. No extra-axial fluid collection. Vascular: No hyperdense vessel. Skull: Scalp soft tissues within normal limits.  Calvarium intact. Sinuses/Orbits: Globes and orbital soft tissues within normal limits.  Mild scattered mucosal thickening within the ethmoidal air cells and sphenoid sinuses. Paranasal sinuses are otherwise clear. No mastoid effusion. CT CERVICAL SPINE FINDINGS Alignment: Vertebral bodies normally aligned with preservation of the normal cervical lordosis. No listhesis. Skull base and vertebrae: Skullbase intact. Normal C1-2 articulations preserved. Dens is intact. Vertebral body heights maintained. No acute fracture identified. Soft tissues and spinal canal: Visualized soft tissues of the neck demonstrate no acute abnormality. No prevertebral edema. Calcified tonsilliths noted. Disc levels: Moderate degenerative spondylolysis as evidenced by intervertebral disc space narrowing, endplate sclerosis, and osteophytosis present at C5-6 and C6-7. Upper chest: Visualized upper mediastinum within normal limits. Visualized lung apices are clear. IMPRESSION: 1. No acute intracranial process. 2. Age-related cerebral atrophy with moderate chronic microvascular ischemic disease. 3. No acute traumatic injury within the cervical spine. Electronically Signed   By: Jeannine Boga M.D.   On: 12/28/2015 01:38    Assessment/Plan 70 year old man presenting following probable syncopal episode. Seizure is less likely, as well as TIA.  Recommendations: 1. MRI of the brain without contrast to rule out acute stroke 2. Stroke workup with risk assessment if MRI shows acute stroke 3. EEG, routine about study 4. Cardiac telemetry 5. Continue aspirin 81 mg per day  We will continue to follow this patient with you.  C.R. Nicole Kindred, MD [facility] [phone]  12/28/2015, 4:44 AM

## 2015-12-28 NOTE — Progress Notes (Signed)
PROGRESS NOTE        PATIENT DETAILS Name: Randall Reeves Age: 70 y.o. Sex: male Date of Birth: 13-Feb-1945 Admit Date: 12/28/2015 Admitting Physician Randall Costa, MD [place]:[id number] Randall Pate, MD  Brief Narrative: Patient is a 70 y.o. male with past medical history of hypertension, dyslipidemia who presented to the hospital following a possible syncopal episode, this was followed by a transient episode of aphasia and confusion. He was subsequently admitted for further evaluation and treatment  Subjective: Is awake and alert.  Assessment/Plan: Syncope: Possible loss of consciousness when he fell in the bathroom prior to admission, I suspect this was probably neurocardiogenic mediated in the setting of either orthostatic hypotension or vasovagal syncope. He did have prodromal symptoms of lightheadedness before he fell/syncopized. EKG/telemetry-negative, await echocardiogram and further workup.  TIA: Not sure if she had TIA or cerebral hypoperfusion due to syncope. MRI brain negative for acute CVA. Awaiting echo and carotid. LDL elevated at 108,  start high intensity statin. A1c pending. Completely nonfocal exam he did await further recommendations from neurology  [id number] Losartan over the next few days  GERD:continue PPI  CKD stage [id number] close to usual baseline-follow  DVT Prophylaxis: Prophylactic Lovenox   Code Status: Full code   Family Communication: None at bedside  Disposition Plan: Remain inpatient-home likely 12/4  Antimicrobial agents: None   Procedures: None  CONSULTS: Neurology  Time spent: 25-minutes-Greater than 50% of this time was spent in counseling, explanation of diagnosis, planning of further management, and coordination of care.  MEDICATIONS: Anti-infectives    None      Scheduled Meds: . aspirin EC  81 mg Oral Daily  . enoxaparin (LOVENOX) injection  40 mg Subcutaneous Q24H  . multivitamin with  minerals  1 tablet Oral Daily  . pantoprazole  40 mg Oral Daily  . sodium chloride flush  3 mL Intravenous Q12H   Continuous Infusions: PRN Meds:.acetaminophen **OR** acetaminophen, hydrALAZINE, ondansetron **OR** ondansetron (ZOFRAN) IV, zolpidem   PHYSICAL EXAM: Vital signs: Vitals:   12/28/15 0811 12/28/15 0903 12/28/15 1120 12/28/15 1320  BP: 131/73  124/84 125/74  Pulse: 80  81 82  Resp: 16  16 15   Temp: 98.1 F (36.7 C) 98 F (36.7 C) 98.6 F (37 C) 98.1 F (36.7 C)  TempSrc: Oral Oral Oral Oral  SpO2: 98% 97% 98% 99%   There were no vitals filed for this visit. There is no height or weight on file to calculate BMI.   General appearance :Awake, alert, not in any distress. Speech Clear. Not toxic Looking Eyes:, pupils equally reactive to light and accomodation,no scleral icterus.Pink conjunctiva HEENT: Atraumatic and Normocephalic Neck: supple, no JVD. No cervical lymphadenopathy. No thyromegaly Resp:Good air entry bilaterally, no added sounds  CVS: S1 S2 regular, no murmurs.  GI: Bowel sounds present, Non tender and not distended with no gaurding, rigidity or rebound.No organomegaly Extremities: B/L Lower Ext shows no edema, both legs are warm to touch Neurology:  speech clear,Non focal, sensation is grossly intact. Psychiatric: Normal judgment and insight. Alert and oriented x 3. Normal mood. Musculoskeletal:gait appears to be normal.No digital cyanosis Skin:No Rash, warm and dry Wounds:N/A  I have personally reviewed following labs and imaging studies  LABORATORY DATA: CBC:  Recent Labs Lab 12/28/15 0035 12/28/15 0438  WBC 5.3 6.9  NEUTROABS 3.1  --   HGB 14.4 14.3  HCT  42.1 42.0  MCV 91.5 91.3  PLT 169 [id number]    Basic Metabolic Panel:  Recent Labs Lab 12/28/15 0035 12/28/15 0438  NA 132* 136  K 4.0 4.1  CL 99* 104  CO2 23 23  GLUCOSE 115* 138*  BUN 31* 28*  CREATININE 1.45* 1.34*  CALCIUM 9.0 9.1    GFR: CrCl cannot be calculated  (Unknown ideal weight.).  Liver Function Tests:  Recent Labs Lab 12/28/15 0035  AST 24  ALT 21  ALKPHOS 64  BILITOT 0.5  PROT 6.6  ALBUMIN 3.8   No results for input(s): LIPASE, AMYLASE in the last 168 hours. No results for input(s): AMMONIA in the last 168 hours.  Coagulation Profile:  Recent Labs Lab 12/28/15 0438  INR 0.98    Cardiac Enzymes: No results for input(s): CKTOTAL, CKMB, CKMBINDEX, TROPONINI in the last 168 hours.  BNP (last 3 results) No results for input(s): PROBNP in the last 8760 hours.  HbA1C: No results for input(s): HGBA1C in the last 72 hours.  CBG: No results for input(s): GLUCAP in the last 168 hours.  Lipid Profile:  Recent Labs  12/28/15 0439  CHOL 191  HDL 43  LDLCALC 108*  TRIG 199*  CHOLHDL 4.4    Thyroid Function Tests:  Recent Labs  12/28/15 0649  TSH 2.983    Anemia Panel:  Recent Labs  12/28/15 0438  VITAMINB12 723    Urine analysis:    Component Value Date/Time   COLORURINE YELLOW 12/28/2015 0209   APPEARANCEUR CLEAR 12/28/2015 0209   LABSPEC 1.019 12/28/2015 0209   PHURINE 6.0 12/28/2015 0209   GLUCOSEU NEGATIVE 12/28/2015 0209   HGBUR NEGATIVE 12/28/2015 0209   BILIRUBINUR NEGATIVE 12/28/2015 0209   KETONESUR NEGATIVE 12/28/2015 0209   PROTEINUR NEGATIVE 12/28/2015 0209   NITRITE NEGATIVE 12/28/2015 0209   LEUKOCYTESUR NEGATIVE 12/28/2015 0209    Sepsis Labs: Lactic Acid, Venous No results found for: LATICACIDVEN  MICROBIOLOGY: No results found for this or any previous visit (from the past 240 hour(s)).  RADIOLOGY STUDIES/RESULTS: Ct Head Wo Contrast  Result Date: 12/28/2015 CLINICAL DATA:  Initial evaluation for acute trauma, fall. EXAM: CT HEAD WITHOUT CONTRAST CT CERVICAL SPINE WITHOUT CONTRAST TECHNIQUE: Multidetector CT imaging of the head and cervical spine was performed following the standard protocol without intravenous contrast. Multiplanar CT image reconstructions of the cervical  spine were also generated. COMPARISON:  Prior study from 11/24/2012. FINDINGS: CT HEAD FINDINGS Brain: Age-related cerebral atrophy with moderate chronic microvascular ischemic disease. No acute intracranial hemorrhage. No findings to suggest acute large vessel territory infarct. No mass lesion, midline shift, or mass effect. No hydrocephalus. No extra-axial fluid collection. Vascular: No hyperdense vessel. Skull: Scalp soft tissues within normal limits.  Calvarium intact. Sinuses/Orbits: Globes and orbital soft tissues within normal limits. Mild scattered mucosal thickening within the ethmoidal air cells and sphenoid sinuses. Paranasal sinuses are otherwise clear. No mastoid effusion. CT CERVICAL SPINE FINDINGS Alignment: Vertebral bodies normally aligned with preservation of the normal cervical lordosis. No listhesis. Skull base and vertebrae: Skullbase intact. Normal C1-2 articulations preserved. Dens is intact. Vertebral body heights maintained. No acute fracture identified. Soft tissues and spinal canal: Visualized soft tissues of the neck demonstrate no acute abnormality. No prevertebral edema. Calcified tonsilliths noted. Disc levels: Moderate degenerative spondylolysis as evidenced by intervertebral disc space narrowing, endplate sclerosis, and osteophytosis present at C5-6 and C6-7. Upper chest: Visualized upper mediastinum within normal limits. Visualized lung apices are clear. IMPRESSION: 1. No acute intracranial process. 2. Age-related  cerebral atrophy with moderate chronic microvascular ischemic disease. 3. No acute traumatic injury within the cervical spine. Electronically Signed   By: Jeannine Boga M.D.   On: 12/28/2015 01:38   Ct Cervical Spine Wo Contrast  Result Date: 12/28/2015 CLINICAL DATA:  Initial evaluation for acute trauma, fall. EXAM: CT HEAD WITHOUT CONTRAST CT CERVICAL SPINE WITHOUT CONTRAST TECHNIQUE: Multidetector CT imaging of the head and cervical spine was performed  following the standard protocol without intravenous contrast. Multiplanar CT image reconstructions of the cervical spine were also generated. COMPARISON:  Prior study from 11/24/2012. FINDINGS: CT HEAD FINDINGS Brain: Age-related cerebral atrophy with moderate chronic microvascular ischemic disease. No acute intracranial hemorrhage. No findings to suggest acute large vessel territory infarct. No mass lesion, midline shift, or mass effect. No hydrocephalus. No extra-axial fluid collection. Vascular: No hyperdense vessel. Skull: Scalp soft tissues within normal limits.  Calvarium intact. Sinuses/Orbits: Globes and orbital soft tissues within normal limits. Mild scattered mucosal thickening within the ethmoidal air cells and sphenoid sinuses. Paranasal sinuses are otherwise clear. No mastoid effusion. CT CERVICAL SPINE FINDINGS Alignment: Vertebral bodies normally aligned with preservation of the normal cervical lordosis. No listhesis. Skull base and vertebrae: Skullbase intact. Normal C1-2 articulations preserved. Dens is intact. Vertebral body heights maintained. No acute fracture identified. Soft tissues and spinal canal: Visualized soft tissues of the neck demonstrate no acute abnormality. No prevertebral edema. Calcified tonsilliths noted. Disc levels: Moderate degenerative spondylolysis as evidenced by intervertebral disc space narrowing, endplate sclerosis, and osteophytosis present at C5-6 and C6-7. Upper chest: Visualized upper mediastinum within normal limits. Visualized lung apices are clear. IMPRESSION: 1. No acute intracranial process. 2. Age-related cerebral atrophy with moderate chronic microvascular ischemic disease. 3. No acute traumatic injury within the cervical spine. Electronically Signed   By: Jeannine Boga M.D.   On: 12/28/2015 01:38   Mr Jodene Nam Head Wo Contrast  Result Date: 12/28/2015 CLINICAL DATA:  Initial evaluation for acute fall, confusion. EXAM: MRI HEAD WITHOUT CONTRAST MRA HEAD  WITHOUT CONTRAST TECHNIQUE: Multiplanar, multiecho pulse sequences of the brain and surrounding structures were obtained without intravenous contrast. Angiographic images of the head were obtained using MRA technique without contrast. COMPARISON:  Prior CT from earlier same day. FINDINGS: MRI HEAD FINDINGS Brain: Diffuse prominence of the CSF containing spaces is compatible with generalized cerebral atrophy. Patchy and confluent T2/FLAIR hyperintensity involving the periventricular, deep, and subcortical white matter both cerebral hemispheres most likely related to chronic microvascular ischemic disease, moderate to advanced in nature. Chronic microvascular ischemic changes present within the pons as well. No abnormal foci of restricted diffusion to suggest acute or subacute ischemia. Gray-white matter differentiation is maintained. No evidence for acute intracranial hemorrhage. Punctate chronic micro hemorrhage noted within the right occipital lobe. Additional small chronic micro hemorrhages noted within the bilateral thalami. Findings likely related of chronic underlying hypertension. No remote cortical infarcts identified. No mass lesion, midline shift or mass effect. No hydrocephalus. No extra-axial fluid collection. Major dural sinuses are grossly patent. Pituitary gland and suprasellar region within normal limits. Midline structures intact. Vascular: Major intracranial vascular flow voids are maintained. Skull and upper cervical spine: Craniocervical junction normal. Visualized upper cervical spine unremarkable. Bone marrow signal intensity normal. No scalp soft tissue abnormality. Sinuses/Orbits: Globes and orbital soft tissues within normal limits. Mild scattered mucosal thickening within the ethmoidal air cells. Paranasal sinuses are otherwise clear. No mastoid effusion. Inner ear structures normal. MRA HEAD FINDINGS ANTERIOR CIRCULATION: Distal cervical segments of the internal carotid arteries are patent  with antegrade flow. Petrous, cavernous, and supraclinoid segments are widely patent without flow-limiting stenosis. A1 segments patent. Anterior communicating artery normal. Anterior cerebral arteries patent to their distal aspects. M1 segments patent without stenosis or occlusion. MCA bifurcations normal. Distal MCA branches well opacified and symmetric. Mild distal small vessel atheromatous irregularity within the MCA branches bilaterally. POSTERIOR CIRCULATION: Vertebral arteries largely code dominant and patent to the vertebrobasilar junction. Posterior inferior cerebral arteries patent proximally. Basilar artery widely patent. Superior cerebral arteries patent bilaterally. Posterior cerebral arteries largely supplied via the basilar artery and are well opacified to their distal aspects. Distal small vessel atheromatous irregularity within the PCA branches bilaterally. No aneurysm or vascular malformation. IMPRESSION: MRI HEAD IMPRESSION: 1. No acute intracranial process. 2. Moderate to advanced chronic microvascular ischemic disease. 3. Few punctate chronic micro hemorrhages involving the bilateral thalami and right occipital lobe, most likely related to chronic underlying hypertension. MRA HEAD IMPRESSION: 1. Distal small vessel atheromatous irregularity within the MCA and PCA branches bilaterally. 2. Otherwise negative intracranial MRA. No large vessel occlusion. No high-grade or correctable stenosis. Electronically Signed   By: Jeannine Boga M.D.   On: 12/28/2015 06:17   Mri Brain Without Contrast  Result Date: 12/28/2015 CLINICAL DATA:  Initial evaluation for acute fall, confusion. EXAM: MRI HEAD WITHOUT CONTRAST MRA HEAD WITHOUT CONTRAST TECHNIQUE: Multiplanar, multiecho pulse sequences of the brain and surrounding structures were obtained without intravenous contrast. Angiographic images of the head were obtained using MRA technique without contrast. COMPARISON:  Prior CT from earlier same  day. FINDINGS: MRI HEAD FINDINGS Brain: Diffuse prominence of the CSF containing spaces is compatible with generalized cerebral atrophy. Patchy and confluent T2/FLAIR hyperintensity involving the periventricular, deep, and subcortical white matter both cerebral hemispheres most likely related to chronic microvascular ischemic disease, moderate to advanced in nature. Chronic microvascular ischemic changes present within the pons as well. No abnormal foci of restricted diffusion to suggest acute or subacute ischemia. Gray-white matter differentiation is maintained. No evidence for acute intracranial hemorrhage. Punctate chronic micro hemorrhage noted within the right occipital lobe. Additional small chronic micro hemorrhages noted within the bilateral thalami. Findings likely related of chronic underlying hypertension. No remote cortical infarcts identified. No mass lesion, midline shift or mass effect. No hydrocephalus. No extra-axial fluid collection. Major dural sinuses are grossly patent. Pituitary gland and suprasellar region within normal limits. Midline structures intact. Vascular: Major intracranial vascular flow voids are maintained. Skull and upper cervical spine: Craniocervical junction normal. Visualized upper cervical spine unremarkable. Bone marrow signal intensity normal. No scalp soft tissue abnormality. Sinuses/Orbits: Globes and orbital soft tissues within normal limits. Mild scattered mucosal thickening within the ethmoidal air cells. Paranasal sinuses are otherwise clear. No mastoid effusion. Inner ear structures normal. MRA HEAD FINDINGS ANTERIOR CIRCULATION: Distal cervical segments of the internal carotid arteries are patent with antegrade flow. Petrous, cavernous, and supraclinoid segments are widely patent without flow-limiting stenosis. A1 segments patent. Anterior communicating artery normal. Anterior cerebral arteries patent to their distal aspects. M1 segments patent without stenosis or  occlusion. MCA bifurcations normal. Distal MCA branches well opacified and symmetric. Mild distal small vessel atheromatous irregularity within the MCA branches bilaterally. POSTERIOR CIRCULATION: Vertebral arteries largely code dominant and patent to the vertebrobasilar junction. Posterior inferior cerebral arteries patent proximally. Basilar artery widely patent. Superior cerebral arteries patent bilaterally. Posterior cerebral arteries largely supplied via the basilar artery and are well opacified to their distal aspects. Distal small vessel atheromatous irregularity within the PCA branches bilaterally. No aneurysm or vascular  malformation. IMPRESSION: MRI HEAD IMPRESSION: 1. No acute intracranial process. 2. Moderate to advanced chronic microvascular ischemic disease. 3. Few punctate chronic micro hemorrhages involving the bilateral thalami and right occipital lobe, most likely related to chronic underlying hypertension. MRA HEAD IMPRESSION: 1. Distal small vessel atheromatous irregularity within the MCA and PCA branches bilaterally. 2. Otherwise negative intracranial MRA. No large vessel occlusion. No high-grade or correctable stenosis. Electronically Signed   By: Jeannine Boga M.D.   On: 12/28/2015 06:17     LOS: 0 days   Oren Binet, MD  [facility] Pager:[phone]  If 7PM-7AM, please contact night-coverage [email or web address] Password [email or web address] 12/28/2015, 2:30 PM

## 2015-12-28 NOTE — Progress Notes (Signed)
Patient admitted from ED. Patient alert and oriented x 4. Patient oriented to room. Tele place and patient was made comfortable.

## 2015-12-28 NOTE — Evaluation (Signed)
Physical Therapy Evaluation Patient Details Name: Randall Reeves MRN: [id number] DOB: 28-Jan-1945 Today's Date: 12/28/2015   History of Present Illness   71 y.o. male with medical history significant of hypertension, hyperlipidemia, GERD, CAD, OSA not on CPAP, CKD-3, renal artery aneurysm, splenic artery aneurysm, erectile dysfunction, who presents with fall, possible syncope and aphasia.  Per EMS report, family stated that the pt went to the bathroom when the family heard a loud bang and found the pt face down on the bathroom floor at about 23:00 last night. Not sure pt had LOC. He could remember what happened in detail. Family states that patient could not speak for a while. No facial droop noted. There is small hematoma in right forehead.  Imaging negative.    Clinical Impression  Pt presents at/near baseline functional level, is independent with all tasks, and demonstrates no instability or other deficits with gait.  Orthostatic BP negative (see flow sheets).  No PT needs, pt encouraged to walk in hall with wife.     Follow Up Recommendations No PT follow up    Equipment Recommendations  None recommended by PT    Recommendations for Other Services       Precautions / Restrictions Precautions Precautions: Fall Precaution Comments: bed alarm      Mobility  Bed Mobility Overal bed mobility: Independent                Transfers Overall transfer level: Independent                  Ambulation/Gait Ambulation/Gait assistance: Independent Ambulation Distance (Feet): 150 Feet Assistive device: None Gait Pattern/deviations: WFL(Within Functional Limits)        Stairs            Wheelchair Mobility    Modified Rankin (Stroke Patients Only)       Balance Overall balance assessment: No apparent balance deficits (not formally assessed)                                           Pertinent Vitals/Pain Pain Assessment: 0-10 Pain Score:  2  Pain Location: headache    Home Living Family/patient expects to be discharged to:: Private residence Living Arrangements: Spouse/significant other Available Help at Discharge: Family Type of Home: House Home Access: Stairs to enter   Technical brewer of Steps: 4 Home Layout: One level Home Equipment: None Additional Comments: shower seat    Prior Function Level of Independence: Independent         Comments: raked leaves for 7 hours before event.  suspects dehydration lead to syncope     Hand Dominance   Dominant Hand: Right    Extremity/Trunk Assessment   Upper Extremity Assessment: Overall WFL for tasks assessed           Lower Extremity Assessment: Overall WFL for tasks assessed      Cervical / Trunk Assessment: Normal  Communication   Communication: No difficulties  Cognition Arousal/Alertness: Awake/alert Behavior During Therapy: WFL for tasks assessed/performed Overall Cognitive Status: Within Functional Limits for tasks assessed                      General Comments      Exercises     Assessment/Plan    PT Assessment Patent does not need any further PT services  PT Problem List  PT Treatment Interventions      PT Goals (Current goals can be found in the Care Plan section)  Acute Rehab PT Goals Patient Stated Goal: home PT Goal Formulation: All assessment and education complete, DC therapy    Frequency     Barriers to discharge        Co-evaluation               End of Session   Activity Tolerance: Patient tolerated treatment well Patient left: in chair;with family/visitor present Nurse Communication: Mobility status    Functional Assessment Tool Used: clinical judgment Functional Limitation: Mobility: Walking and moving around Mobility: Walking and Moving Around Current Status [id number]): At least 1 percent but less than 20 percent impaired, limited or restricted Mobility: Walking and Moving  Around Goal Status [phone]): At least 1 percent but less than 20 percent impaired, limited or restricted    Time: 1425-1440 PT Time Calculation (min) (ACUTE ONLY): 15 min   Charges:   PT Evaluation $PT Eval Low Complexity: 1 Procedure     PT G Codes:   PT G-Codes **NOT FOR INPATIENT CLASS** Functional Assessment Tool Used: clinical judgment Functional Limitation: Mobility: Walking and moving around Mobility: Walking and Moving Around Current Status [id number]): At least 1 percent but less than 20 percent impaired, limited or restricted Mobility: Walking and Moving Around Goal Status [phone]): At least 1 percent but less than 20 percent impaired, limited or restricted    Herbie Drape 12/28/2015, 3:11 PM

## 2015-12-28 NOTE — H&P (Signed)
History and Physical    [name] [id number] DOB: Aug 16, 1945 DOA: 12/28/2015  Referring MD/NP/PA:   PCP: Viviana Simpler, MD   Patient coming from:  The patient is coming from home.  At baseline, pt is independent for most of ADL.  Chief Complaint: fall, possible syncope and aphasia  HPI: Randall Reeves is a 70 y.o. male with medical history significant of hypertension, hyperlipidemia, GERD, CAD, OSA not on CPAP, CKD-3, renal artery aneurysm, splenic artery aneurysm, erectile dysfunction, who presents with fall, possible syncope and aphasia.  Per EMS report, family stated that the pt went to the bathroom when the family heard a loud bang and found the pt face down on the bathroom floor at about 23:00 last night. Not sure pt had LOC. He could remember what happened in detail. Family states that patient could not speak for a while. No facial droop noted. There is small hematoma in right forehead. Patient denies dizziness, unilateral weakness, vision change or hearing loss currently. No chest pain, shortness breath, fever, chills, nausea, vomiting, abdominal pain, symptoms of UTI. He states that he has numbness in both feet which has been going on for 2 years, no change today. He was diagnosed as neuropathy by neurologist.   ED Course: pt was found to have WBC 5.3, troponin negative, negative UDS, negative urinalysis, sodium 132, alcohol level less than 5, stable renal function, negative CT head and C-spine for acute abnormalities, temperature normal, no tachycardia, oxygen saturation 95% on room air. Patient is placed on telemetry bed for observation. Neurology, Dr. Nicole Kindred was consulted  Review of Systems:   General: no fevers, chills, no changes in body weight. HEENT: no blurry vision, hearing changes or sore throat Respiratory: no dyspnea, coughing, wheezing CV: no chest pain, no palpitations GI: no nausea, vomiting, abdominal pain, diarrhea, constipation GU: no dysuria, burning on  urination, increased urinary frequency, hematuria  Ext: no leg edema Neuro: no unilateral weakness. no vision change or hearing loss. Had fall and possible syncope. Has aphasia. Has foot numbness bilaterally Skin: no rash. Has small hematoma in right forehead. MSK: No muscle spasm, no deformity, no limitation of range of movement in spin Heme: No easy bruising.  Travel history: No recent long distant travel.  Allergy:  Allergies  Allergen Reactions  . Bisoprolol-Hydrochlorothiazide     REACTION: muscle pain, cramps  . Codeine Nausea Only    Past Medical History:  Diagnosis Date  . Allergy   . CKD (chronic kidney disease), stage III   . Coronary atherosclerosis of unspecified type of vessel, native or graft 2011   MI but cath benign. Dr Otis Brace  . GERD (gastroesophageal reflux disease)   . Hyperlipidemia   . Hypertension   . Obstructive sleep apnea     Past Surgical History:  Procedure Laterality Date  . CARDIAC CATHETERIZATION    . CARPAL TUNNEL RELEASE    . spermatocele repair      Social History:  reports that he has never smoked. He has never used smokeless tobacco. He reports that he drinks about 1.8 - 3.0 oz of alcohol per week . He reports that he does not use drugs.  Family History:  Family History  Problem Relation Age of Onset  . Stroke Mother   . Hypertension Mother   . Diabetes Father   . Asthma Father   . Heart failure Father   . Heart disease Father   . Hypertension Sister   . Diabetes Sister   . Neuropathy  Brother   . Hypertension Brother   . Cancer Brother   . Diabetes Brother   . Heart disease Brother   . Hypertension Brother   . Hypertension Brother   . Hypertension Brother   . Cancer Brother     prostate     Prior to Admission medications   Medication Sig Start Date End Date Taking? Authorizing Provider  aspirin 81 MG chewable tablet Chew 81 mg by mouth daily.   Yes Historical Provider, MD  losartan (COZAAR) 50 MG tablet take 1 tablet  by mouth once daily 08/05/15  Yes Venia Carbon, MD  Lutein-Zeaxanthin 20-0.8 MG CAPS Take 1 capsule by mouth daily.   Yes Historical Provider, MD  Multiple Vitamin (MULTIVITAMIN) tablet Take 1 tablet by mouth daily.    Yes Historical Provider, MD  omeprazole (PRILOSEC) 20 MG capsule Take 20 mg by mouth daily.   Yes Historical Provider, MD  tadalafil (CIALIS) 5 MG tablet Take 1 tablet (5 mg total) by mouth daily. 08/05/15  Yes Venia Carbon, MD  hydrocortisone 2.5 % cream Apply topically 3 (three) times daily as needed. Patient not taking: Reported on 12/28/2015 09/01/15   Venia Carbon, MD    Physical Exam: Vitals:   12/28/15 0020 12/28/15 0045 12/28/15 0130 12/28/15 0600  BP: 129/89 127/87 121/86 117/73  Pulse: 84 82 87 79  Resp: 18 12 16 18   Temp: 97.8 F (36.6 C)   98.2 F (36.8 C)  TempSrc: Oral   Oral  SpO2: 98% 99% 95% 96%   General: Not in acute distress HEENT:       Eyes: PERRL, EOMI, no scleral icterus.       ENT: No discharge from the ears and nose, no pharynx injection, no tonsillar enlargement.        Neck: No JVD, no bruit, no mass felt. Heme: No neck lymph node enlargement. Cardiac: S1/S2, RRR, No murmurs, No gallops or rubs. Respiratory: No rales, wheezing, rhonchi or rubs. GI: Soft, nondistended, nontender, no rebound pain, no organomegaly, BS present. GU: No hematuria Ext: No pitting leg edema bilaterally. 2+DP/PT pulse bilaterally. Musculoskeletal: No joint deformities, No joint redness or warmth, no limitation of ROM in spin. Skin: No rashes. Has small hematoma in right forehead. Neuro: Alert, oriented X3, cranial nerves II-XII grossly intact, moves all extremities normally. Muscle strength 5/5 in all extremities, sensation to light touch intact. Brachial reflex 2+ bilaterally. Negative Babinski's sign. Normal finger to nose test. Psych: Patient is not psychotic, no suicidal or hemocidal ideation.  Labs on Admission: I have personally reviewed following  labs and imaging studies  CBC:  Recent Labs Lab 12/28/15 0035 12/28/15 0438  WBC 5.3 6.9  NEUTROABS 3.1  --   HGB 14.4 14.3  HCT 42.1 42.0  MCV 91.5 91.3  PLT 169 [id number]   Basic Metabolic Panel:  Recent Labs Lab 12/28/15 0035 12/28/15 0438  NA 132* 136  K 4.0 4.1  CL 99* 104  CO2 23 23  GLUCOSE 115* 138*  BUN 31* 28*  CREATININE 1.45* 1.34*  CALCIUM 9.0 9.1   GFR: CrCl cannot be calculated (Unknown ideal weight.). Liver Function Tests:  Recent Labs Lab 12/28/15 0035  AST 24  ALT 21  ALKPHOS 64  BILITOT 0.5  PROT 6.6  ALBUMIN 3.8   No results for input(s): LIPASE, AMYLASE in the last 168 hours. No results for input(s): AMMONIA in the last 168 hours. Coagulation Profile:  Recent Labs Lab 12/28/15 0438  INR 0.98  Cardiac Enzymes: No results for input(s): CKTOTAL, CKMB, CKMBINDEX, TROPONINI in the last 168 hours. BNP (last 3 results) No results for input(s): PROBNP in the last 8760 hours. HbA1C: No results for input(s): HGBA1C in the last 72 hours. CBG: No results for input(s): GLUCAP in the last 168 hours. Lipid Profile:  Recent Labs  12/28/15 0439  CHOL 191  HDL 43  LDLCALC 108*  TRIG 199*  CHOLHDL 4.4   Thyroid Function Tests: No results for input(s): TSH, T4TOTAL, FREET4, T3FREE, THYROIDAB in the last 72 hours. Anemia Panel:  Recent Labs  12/28/15 0438  VITAMINB12 723   Urine analysis:    Component Value Date/Time   COLORURINE YELLOW 12/28/2015 0209   APPEARANCEUR CLEAR 12/28/2015 0209   LABSPEC 1.019 12/28/2015 0209   PHURINE 6.0 12/28/2015 0209   GLUCOSEU NEGATIVE 12/28/2015 0209   HGBUR NEGATIVE 12/28/2015 0209   BILIRUBINUR NEGATIVE 12/28/2015 0209   KETONESUR NEGATIVE 12/28/2015 0209   PROTEINUR NEGATIVE 12/28/2015 0209   NITRITE NEGATIVE 12/28/2015 0209   LEUKOCYTESUR NEGATIVE 12/28/2015 0209   Sepsis Labs: @LABRCNTIP (procalcitonin:4,lacticidven:4) )No results found for this or any previous visit (from the past 240  hour(s)).   Radiological Exams on Admission: Ct Head Wo Contrast  Result Date: 12/28/2015 CLINICAL DATA:  Initial evaluation for acute trauma, fall. EXAM: CT HEAD WITHOUT CONTRAST CT CERVICAL SPINE WITHOUT CONTRAST TECHNIQUE: Multidetector CT imaging of the head and cervical spine was performed following the standard protocol without intravenous contrast. Multiplanar CT image reconstructions of the cervical spine were also generated. COMPARISON:  Prior study from 11/24/2012. FINDINGS: CT HEAD FINDINGS Brain: Age-related cerebral atrophy with moderate chronic microvascular ischemic disease. No acute intracranial hemorrhage. No findings to suggest acute large vessel territory infarct. No mass lesion, midline shift, or mass effect. No hydrocephalus. No extra-axial fluid collection. Vascular: No hyperdense vessel. Skull: Scalp soft tissues within normal limits.  Calvarium intact. Sinuses/Orbits: Globes and orbital soft tissues within normal limits. Mild scattered mucosal thickening within the ethmoidal air cells and sphenoid sinuses. Paranasal sinuses are otherwise clear. No mastoid effusion. CT CERVICAL SPINE FINDINGS Alignment: Vertebral bodies normally aligned with preservation of the normal cervical lordosis. No listhesis. Skull base and vertebrae: Skullbase intact. Normal C1-2 articulations preserved. Dens is intact. Vertebral body heights maintained. No acute fracture identified. Soft tissues and spinal canal: Visualized soft tissues of the neck demonstrate no acute abnormality. No prevertebral edema. Calcified tonsilliths noted. Disc levels: Moderate degenerative spondylolysis as evidenced by intervertebral disc space narrowing, endplate sclerosis, and osteophytosis present at C5-6 and C6-7. Upper chest: Visualized upper mediastinum within normal limits. Visualized lung apices are clear. IMPRESSION: 1. No acute intracranial process. 2. Age-related cerebral atrophy with moderate chronic microvascular ischemic  disease. 3. No acute traumatic injury within the cervical spine. Electronically Signed   By: Jeannine Boga M.D.   On: 12/28/2015 01:38   Ct Cervical Spine Wo Contrast  Result Date: 12/28/2015 CLINICAL DATA:  Initial evaluation for acute trauma, fall. EXAM: CT HEAD WITHOUT CONTRAST CT CERVICAL SPINE WITHOUT CONTRAST TECHNIQUE: Multidetector CT imaging of the head and cervical spine was performed following the standard protocol without intravenous contrast. Multiplanar CT image reconstructions of the cervical spine were also generated. COMPARISON:  Prior study from 11/24/2012. FINDINGS: CT HEAD FINDINGS Brain: Age-related cerebral atrophy with moderate chronic microvascular ischemic disease. No acute intracranial hemorrhage. No findings to suggest acute large vessel territory infarct. No mass lesion, midline shift, or mass effect. No hydrocephalus. No extra-axial fluid collection. Vascular: No hyperdense vessel. Skull: Scalp  soft tissues within normal limits.  Calvarium intact. Sinuses/Orbits: Globes and orbital soft tissues within normal limits. Mild scattered mucosal thickening within the ethmoidal air cells and sphenoid sinuses. Paranasal sinuses are otherwise clear. No mastoid effusion. CT CERVICAL SPINE FINDINGS Alignment: Vertebral bodies normally aligned with preservation of the normal cervical lordosis. No listhesis. Skull base and vertebrae: Skullbase intact. Normal C1-2 articulations preserved. Dens is intact. Vertebral body heights maintained. No acute fracture identified. Soft tissues and spinal canal: Visualized soft tissues of the neck demonstrate no acute abnormality. No prevertebral edema. Calcified tonsilliths noted. Disc levels: Moderate degenerative spondylolysis as evidenced by intervertebral disc space narrowing, endplate sclerosis, and osteophytosis present at C5-6 and C6-7. Upper chest: Visualized upper mediastinum within normal limits. Visualized lung apices are clear. IMPRESSION: 1.  No acute intracranial process. 2. Age-related cerebral atrophy with moderate chronic microvascular ischemic disease. 3. No acute traumatic injury within the cervical spine. Electronically Signed   By: Jeannine Boga M.D.   On: 12/28/2015 01:38     EKG: Independently reviewed. Sinus rhythm, QTC 440, early R-wave progression, nonspecific T-wave change    Assessment/Plan Principal Problem:   Syncope Active Problems:   Hyperlipemia   Essential hypertension, benign   GERD   CAD (coronary artery disease)   Fall   CKD (chronic kidney disease), stage III   Aphasia   TIA (transient ischemic attack)   Fall, possible syncope and aphasia: Etiology is not clear. Differential diagnosis include TIA, seizure, cardiac arrhythmia. Neurology, Dr. Nicole Kindred was consulted, recommended MRI and EEG.   -will place on tele bed for obs -highly appreciate Dr. Riley Lam consultation, with follow-up recommendations as follows: 1. MRI of the brain without contrast to rule out acute stroke (I ordered both MRI and MRA due to concerns of TIA, this was before Dr. Nicole Kindred completed his consult note) 2. Stroke workup with risk assessment if MRI shows acute stroke 3. EEG, routine about study 4. Cardiac telemetry 5. Continue aspirin 81 mg per day - also get swallowing screen -A1c, FLP and UDS -orthostatic vital signs, B12 level - PT/OT  HLD: Last LDL was 137 on 01/14/15. Patient is not taking medications -Check FLP  HTN:  -continue Cozaar -IV hydralazine when necessary  GERD: -Protonix  CAD: no CP. -continue ASA  CKD (chronic kidney disease), stage III: stable. Baseline creatinine 1.3-1.5. His creatinine is 1.45. -Follow-up renal function by BMP.  Mild hyponatremia: Sodium 132. Currently mental status normal. -IV normal saline, 100 mL per hour -Follow-up by BMP -check TSH   DVT ppx: SQ Lovenox Code Status: Full code Family Communication: None at bed side. Disposition Plan:  Anticipate  discharge back to previous home environment Consults called:  Neurology, Dr. Nicole Kindred was consulted Admission status: Obs / tele   Date of Service 12/28/2015    Ivor Costa [facility] Pager [phone]  If 7PM-7AM, please contact night-coverage [email or web address] Password [email or web address] 12/28/2015, 6:20 AM

## 2015-12-28 NOTE — ED Notes (Signed)
Patient transported to CT 

## 2015-12-28 NOTE — ED Provider Notes (Signed)
[facility] DEPT Provider Note   CSN: [id number] Arrival date & time: 12/28/15  0014  By signing my name below, I, Soijett Blue, attest that this documentation has been prepared under the direction and in the presence of Merryl Hacker, MD. Electronically Signed: Soijett Blue, ED Scribe. 12/28/15. 12:23 AM.  History   Chief Complaint Chief Complaint  Patient presents with  . Altered Mental Status    HPI Randall Reeves is a 70 y.o. male with a PMHx of HTN, who presents to the Emergency Department brought in by EMS complaining of AMS onset PTA. Pt states that prior to the unwitnessed fall he felt at his baseline. Per EMS report, family stated that the pt went to the bathroom when the family heard a loud bang and found the pt face down on the bathroom floor. Pt unable to say whether he syncopized or not.  Pt denies pain anywhere at this time. He states that he has not tried any medications for the relief of his symptoms. He denies neck pain, CP, SOB, abdominal pain, nausea, vomiting, and any other symptoms. Denies taking blood thinners.   Family was not initially present to provide collateral information. However, upon arrival while he states that she heard a thud. When she arrived to the bedroom he was approximately 25 feet from the where he originally fell. He was face down. He was making moaning noises. He was unable to speak. He did appear aware at times.   The history is provided by the patient. No language interpreter was used.    Past Medical History:  Diagnosis Date  . Allergy   . Coronary atherosclerosis of unspecified type of vessel, native or graft 2011   MI but cath benign. Dr Otis Brace  . GERD (gastroesophageal reflux disease)   . Hyperlipidemia   . Hypertension   . Obstructive sleep apnea     Patient Active Problem List   Diagnosis Date Noted  . Rectal burning 09/01/2015  . Renal artery aneurysm ([place]) 01/14/2015  . Memory loss 10/10/2014  . Sensory loss 01/08/2014    . Advanced directives, counseling/discussion 01/08/2014  . BPH (benign prostatic hypertrophy) 01/08/2014  . Eczema of hand 12/05/2013  . Splenic artery aneurysm ([place]) 11/25/2012  . Vertigo, benign paroxysmal 06/09/2012  . Erectile dysfunction 06/09/2012  . Routine general medical examination at a health care facility 10/20/2010  . CAD (coronary artery disease) 10/18/2009  . Hyperlipemia 05/14/2006  . Essential hypertension, benign 05/14/2006  . ALLERGIC RHINITIS 05/14/2006  . GERD 05/14/2006    Past Surgical History:  Procedure Laterality Date  . CARDIAC CATHETERIZATION    . CARPAL TUNNEL RELEASE    . spermatocele repair         Home Medications    Prior to Admission medications   Medication Sig Start Date End Date Taking? Authorizing Provider  aspirin 81 MG chewable tablet Chew 81 mg by mouth daily.   Yes Historical Provider, MD  losartan (COZAAR) 50 MG tablet take 1 tablet by mouth once daily 08/05/15  Yes Venia Carbon, MD  Lutein-Zeaxanthin 20-0.8 MG CAPS Take 1 capsule by mouth daily.   Yes Historical Provider, MD  Multiple Vitamin (MULTIVITAMIN) tablet Take 1 tablet by mouth daily.    Yes Historical Provider, MD  omeprazole (PRILOSEC) 20 MG capsule Take 20 mg by mouth daily.   Yes Historical Provider, MD  tadalafil (CIALIS) 5 MG tablet Take 1 tablet (5 mg total) by mouth daily. 08/05/15  Yes Venia Carbon, MD  hydrocortisone 2.5 % cream Apply topically 3 (three) times daily as needed. Patient not taking: Reported on 12/28/2015 09/01/15   Venia Carbon, MD    Family History Family History  Problem Relation Age of Onset  . Stroke Mother   . Hypertension Mother   . Diabetes Father   . Asthma Father   . Heart failure Father   . Heart disease Father   . Hypertension Sister   . Diabetes Sister   . Neuropathy Brother   . Hypertension Brother   . Cancer Brother   . Diabetes Brother   . Heart disease Brother   . Hypertension Brother   . Hypertension Brother    . Hypertension Brother   . Cancer Brother     prostate    Social History Social History  Substance Use Topics  . Smoking status: Never Smoker  . Smokeless tobacco: Never Used  . Alcohol use 1.8 - 3.0 oz/week    3 - 5 Cans of beer per week     Comment: a beer a day     Allergies   Bisoprolol-hydrochlorothiazide and Codeine   Review of Systems Review of Systems  Constitutional: Negative for fever.  Respiratory: Negative for shortness of breath.   Cardiovascular: Negative for chest pain.  Gastrointestinal: Negative for abdominal pain, nausea and vomiting.  Genitourinary: Negative for dysuria.  Musculoskeletal: Negative for neck pain.  Neurological: Positive for syncope. Negative for dizziness.  All other systems reviewed and are negative.    Physical Exam Updated Vital Signs BP 121/86   Pulse 87   Temp 97.8 F (36.6 C) (Oral)   Resp 16   SpO2 95%   Physical Exam  Constitutional: He is oriented to person, place, and time. No distress.  Elderly, ABC's intact, no acute distress  HENT:  Head: Normocephalic.  Contusion noted over the right forehead  Eyes: Pupils are equal, round, and reactive to light.  Pupils 4 mm reactive bilaterally  Neck:  C-collar in place  Cardiovascular: Normal rate, regular rhythm and normal heart sounds.   No murmur heard. Pulmonary/Chest: Effort normal and breath sounds normal. No respiratory distress. He has no wheezes.  Abdominal: Soft. Bowel sounds are normal. There is no tenderness. There is no rebound.  Musculoskeletal: He exhibits no edema or deformity.  Normal range of motion of the hips and knees  Neurological: He is alert and oriented to person, place, and time.  Cranial nerves II through XII intact, 5 out of 5 strength in all 4 extremities, no drift  Skin: Skin is warm and dry.  Psychiatric: He has a normal mood and affect.  Nursing note and vitals reviewed.    ED Treatments / Results  DIAGNOSTIC STUDIES: Oxygen  Saturation is 98% on RA, nl by my interpretation.    COORDINATION OF CARE: 12:22 AM Discussed treatment plan with pt at bedside which includes CT head, CT C-spine, labs, EKG, and pt agreed to plan.   Labs (all labs ordered are listed, but only abnormal results are displayed) Labs Reviewed  COMPREHENSIVE METABOLIC PANEL - Abnormal; Notable for the following:       Result Value   Sodium 132 (*)    Chloride 99 (*)    Glucose, Bld 115 (*)    BUN 31 (*)    Creatinine, Ser 1.45 (*)    GFR calc non Af Amer 48 (*)    GFR calc Af Amer 56 (*)    All other components within normal limits  CBC  WITH DIFFERENTIAL/PLATELET  ETHANOL  RAPID URINE DRUG SCREEN, HOSP PERFORMED  URINALYSIS, ROUTINE W REFLEX MICROSCOPIC (NOT AT [facility])  Randolm Idol, ED  CBG MONITORING, ED    EKG  EKG Interpretation  Date/Time:  Sunday December 28 2015 00:22:39 EST Ventricular Rate:  80 PR Interval:    QRS Duration: 90 QT Interval:  381 QTC Calculation: 440 R Axis:   -4 Text Interpretation:  Sinus rhythm Prolonged PR interval Confirmed by Dina Rich  MD, Kimila Papaleo ([id number]) on 12/28/2015 12:26:22 AM       Radiology Ct Head Wo Contrast  Result Date: 12/28/2015 CLINICAL DATA:  Initial evaluation for acute trauma, fall. EXAM: CT HEAD WITHOUT CONTRAST CT CERVICAL SPINE WITHOUT CONTRAST TECHNIQUE: Multidetector CT imaging of the head and cervical spine was performed following the standard protocol without intravenous contrast. Multiplanar CT image reconstructions of the cervical spine were also generated. COMPARISON:  Prior study from 11/24/2012. FINDINGS: CT HEAD FINDINGS Brain: Age-related cerebral atrophy with moderate chronic microvascular ischemic disease. No acute intracranial hemorrhage. No findings to suggest acute large vessel territory infarct. No mass lesion, midline shift, or mass effect. No hydrocephalus. No extra-axial fluid collection. Vascular: No hyperdense vessel. Skull: Scalp soft tissues within normal  limits.  Calvarium intact. Sinuses/Orbits: Globes and orbital soft tissues within normal limits. Mild scattered mucosal thickening within the ethmoidal air cells and sphenoid sinuses. Paranasal sinuses are otherwise clear. No mastoid effusion. CT CERVICAL SPINE FINDINGS Alignment: Vertebral bodies normally aligned with preservation of the normal cervical lordosis. No listhesis. Skull base and vertebrae: Skullbase intact. Normal C1-2 articulations preserved. Dens is intact. Vertebral body heights maintained. No acute fracture identified. Soft tissues and spinal canal: Visualized soft tissues of the neck demonstrate no acute abnormality. No prevertebral edema. Calcified tonsilliths noted. Disc levels: Moderate degenerative spondylolysis as evidenced by intervertebral disc space narrowing, endplate sclerosis, and osteophytosis present at C5-6 and C6-7. Upper chest: Visualized upper mediastinum within normal limits. Visualized lung apices are clear. IMPRESSION: 1. No acute intracranial process. 2. Age-related cerebral atrophy with moderate chronic microvascular ischemic disease. 3. No acute traumatic injury within the cervical spine. Electronically Signed   By: Jeannine Boga M.D.   On: 12/28/2015 01:38   Ct Cervical Spine Wo Contrast  Result Date: 12/28/2015 CLINICAL DATA:  Initial evaluation for acute trauma, fall. EXAM: CT HEAD WITHOUT CONTRAST CT CERVICAL SPINE WITHOUT CONTRAST TECHNIQUE: Multidetector CT imaging of the head and cervical spine was performed following the standard protocol without intravenous contrast. Multiplanar CT image reconstructions of the cervical spine were also generated. COMPARISON:  Prior study from 11/24/2012. FINDINGS: CT HEAD FINDINGS Brain: Age-related cerebral atrophy with moderate chronic microvascular ischemic disease. No acute intracranial hemorrhage. No findings to suggest acute large vessel territory infarct. No mass lesion, midline shift, or mass effect. No  hydrocephalus. No extra-axial fluid collection. Vascular: No hyperdense vessel. Skull: Scalp soft tissues within normal limits.  Calvarium intact. Sinuses/Orbits: Globes and orbital soft tissues within normal limits. Mild scattered mucosal thickening within the ethmoidal air cells and sphenoid sinuses. Paranasal sinuses are otherwise clear. No mastoid effusion. CT CERVICAL SPINE FINDINGS Alignment: Vertebral bodies normally aligned with preservation of the normal cervical lordosis. No listhesis. Skull base and vertebrae: Skullbase intact. Normal C1-2 articulations preserved. Dens is intact. Vertebral body heights maintained. No acute fracture identified. Soft tissues and spinal canal: Visualized soft tissues of the neck demonstrate no acute abnormality. No prevertebral edema. Calcified tonsilliths noted. Disc levels: Moderate degenerative spondylolysis as evidenced by intervertebral disc space narrowing, endplate sclerosis,  and osteophytosis present at C5-6 and C6-7. Upper chest: Visualized upper mediastinum within normal limits. Visualized lung apices are clear. IMPRESSION: 1. No acute intracranial process. 2. Age-related cerebral atrophy with moderate chronic microvascular ischemic disease. 3. No acute traumatic injury within the cervical spine. Electronically Signed   By: Jeannine Boga M.D.   On: 12/28/2015 01:38    Procedures Procedures (including critical care time)  Medications Ordered in ED Medications - No data to display   Initial Impression / Assessment and Plan / ED Course  I have reviewed the triage vital signs and the nursing notes.  Pertinent labs & imaging results that were available during my care of the patient were reviewed by me and considered in my medical decision making (see chart for details).  Clinical Course     Patient presents after a fall. It is unclear whether he had a syncopal episode. No history of seizures. On my evaluation he is now alert and oriented 3. He  has no focal neurologic deficits. EKG shows no evidence of bradyarrhythmia. Basic labwork is reassuring. CT head and neck without evidence of traumatic injury.  3:25 AM On reevaluation, patient's family is at the bedside. Wife provides additional collateral information. She reports a 20-30 minute timeframe where patient had difficulty speaking. She was concerned for stroke. Patient has no recollection of crawling from where he fell to where his wife found him.  He also is unsure why he fell. "I don't think I passed out." He is back to his baseline. Continues to be neurologically intact. Considerations include syncope, seizure which the patient has no history of, TIA.  So patient would benefit from observation admission for TIA workup and cardiac monitoring.  3:28 AM Discussed with Dr. Nicole Kindred who agrees with TIA w/u and admission.  Final Clinical Impressions(s) / ED Diagnoses   Final diagnoses:  Transient alteration of awareness  Other specified transient cerebral ischemias    New Prescriptions New Prescriptions   No medications on file   I personally performed the services described in this documentation, which was scribed in my presence. The recorded information has been reviewed and is accurate.\    Merryl Hacker, MD 12/28/15 0330

## 2015-12-28 NOTE — ED Notes (Signed)
Pt returned from CT without C-Collar in place. RN counseled pt about the benefit of the collar and the risks of not wearing it until the CT is resulted. Pt declined the C-Collar. Family was present in room for conversation.

## 2015-12-28 NOTE — ED Notes (Signed)
Patient transported to MRI 

## 2015-12-29 ENCOUNTER — Observation Stay (HOSPITAL_COMMUNITY): Payer: PPO

## 2015-12-29 ENCOUNTER — Observation Stay (HOSPITAL_BASED_OUTPATIENT_CLINIC_OR_DEPARTMENT_OTHER): Payer: PPO

## 2015-12-29 DIAGNOSIS — I6789 Other cerebrovascular disease: Secondary | ICD-10-CM | POA: Diagnosis not present

## 2015-12-29 DIAGNOSIS — G459 Transient cerebral ischemic attack, unspecified: Secondary | ICD-10-CM

## 2015-12-29 DIAGNOSIS — I2510 Atherosclerotic heart disease of native coronary artery without angina pectoris: Secondary | ICD-10-CM

## 2015-12-29 DIAGNOSIS — F05 Delirium due to known physiological condition: Secondary | ICD-10-CM

## 2015-12-29 DIAGNOSIS — R55 Syncope and collapse: Secondary | ICD-10-CM | POA: Diagnosis not present

## 2015-12-29 DIAGNOSIS — R4701 Aphasia: Secondary | ICD-10-CM | POA: Diagnosis not present

## 2015-12-29 DIAGNOSIS — E785 Hyperlipidemia, unspecified: Secondary | ICD-10-CM | POA: Diagnosis not present

## 2015-12-29 LAB — VAS US CAROTID
LCCADSYS: -110 cm/s
LCCAPDIAS: 18 cm/s
LCCAPSYS: 105 cm/s
LEFT ECA DIAS: -14 cm/s
LEFT VERTEBRAL DIAS: -18 cm/s
LICADDIAS: -24 cm/s
LICAPDIAS: -16 cm/s
Left CCA dist dias: -28 cm/s
Left ICA dist sys: -56 cm/s
Left ICA prox sys: -46 cm/s
RCCAPDIAS: -25 cm/s
RCCAPSYS: -117 cm/s
RIGHT ECA DIAS: -17 cm/s
RIGHT VERTEBRAL DIAS: -11 cm/s
Right cca dist sys: -67 cm/s

## 2015-12-29 LAB — HEMOGLOBIN A1C
HEMOGLOBIN A1C: 5.7 % — AB (ref 4.8–5.6)
Mean Plasma Glucose: 117 mg/dL

## 2015-12-29 LAB — GLUCOSE, CAPILLARY: GLUCOSE-CAPILLARY: 109 mg/dL — AB (ref 65–99)

## 2015-12-29 LAB — ECHOCARDIOGRAM COMPLETE

## 2015-12-29 MED ORDER — ASPIRIN EC 325 MG PO TBEC: 325.0000 mg | DELAYED_RELEASE_TABLET | Freq: Every day | ORAL | 0 refills | Status: DC

## 2015-12-29 MED ORDER — ATORVASTATIN CALCIUM 40 MG PO TABS: 40.0000 mg | ORAL_TABLET | Freq: Every day | ORAL | 0 refills | Status: DC

## 2015-12-29 NOTE — Progress Notes (Signed)
Neurology Progress Note  Subjective: Randall Reeves is a 70 yo male with PMHx of HTN, HLD, CAD, CKD III, OSA not on CPAP who presented to the ED on 12/28/15 after a fall at home. Patient was unable to state if he lost consciousness; however, he was confused after the fall and was unable to speak per family but no facial droop or weakness. His other review of symptoms was negative except for chronic neuropathy. CT Head revealed no acute intracranial abnormalities. Patient was admitted for possible syncope and Neurology consulted who felt seizure and TIA were unlikely but to proceed with work up with MRI, EEG, telemetry.   Patient was seen and examined this morning. Upon discussing his story further, patient states he got up to use the bathroom around 11 pm on 12/27/15 and remembers waking up next to the toilet. He does not remember beginning to urinate nor does he remember any symptoms prior to his fall including palpitations, headache, chest pain, shortness of breath, lightheadedness or nausea. He woke up next to the toilet and remembers trying to get up but couldn't. Later, he was found about 25 feet away laying on the floor by his wife who states he was diaphoretic, cold and clammy. Patient does not remember falling, does not remember getting up and falling again. He does remember feeling nauseous at some point. Patient states he was in his normal state of health prior to his fall. He denies any recent illness or missed medications. He denies any new medications. The day prior to his fall he admits to decreased po intake as he was busy working in his yard. He did eat dinner the night prior. Currently, patient feels well. He denies any recurrent falls. He denies lightheadedness, nausea, chest pain, shortness of breath, vision changes, trouble hearing or speaking, weakness or numbness.   Exam: Vitals:   12/29/15 0504 12/29/15 1113  BP: 131/75 120/82  Pulse: 72 74  Resp: 18 19  Temp: 98.2 F (36.8 C) 98.1 F  (36.7 C)   Vitals:   12/28/15 2115 12/29/15 0223 12/29/15 0504 12/29/15 1113  BP: 119/75 131/89 131/75 120/82  Pulse: 73 81 72 74  Resp: 16 18 18 19   Temp: 98.2 F (36.8 C) 98.1 F (36.7 C) 98.2 F (36.8 C) 98.1 F (36.7 C)  TempSrc: Oral Axillary Oral Oral  SpO2: 95% 96% 96% 98%   General: Vital signs reviewed.  Patient is well-developed and well-nourished, in no acute distress and cooperative with exam.  Head: hematoma on left forehead/temple Neck: Supple, trachea midline, no carotid bruit present.  Cardiovascular: RRR, S1 normal, S2 normal, no murmurs, gallops, or rubs. Pulmonary/Chest: Clear to auscultation bilaterally, no wheezes, rales, or rhonchi. Abdominal: Soft, non-tender, non-distended, BS + Extremities: No lower extremity edema bilaterally, pulses symmetric and intact bilaterally.  Skin: Warm, dry and intact.  Psychiatric: Normal mood and affect. speech and behavior is normal. Cognition and memory are normal.   Neuro: Mental Status: Patient is awake, alert, oriented to person, place, month, year, and situation. Patient is able to give a clear and coherent history. No signs of aphasia or neglect Cranial Nerves: II: Visual Fields are full. Pupils are equal, round, and reactive to light.  III,IV, VI: EOMI without ptosis or diploplia.  V: Facial sensation is symmetric to temperature VII: Facial movement is symmetric.  VIII: hearing is intact to voice X: Uvula elevates symmetrically XI: Shoulder shrug is symmetric. XII: tongue is midline without atrophy or fasciculations.  Motor: Tone is normal.  Bulk is normal. 5/5 strength was present in all four extremities.  Sensory: Sensation is symmetric to light touch and temperature in the arms and legs. Deep Tendon Reflexes: 2+ and symmetric in the triceps and patellae. Plantars: Toes are downgoing bilaterally.  Cerebellar: FNF and HKS are intact bilaterally  Pertinent Labs: TSH 2.963 UA negative for infection UDS  negative HgbA1c 5.7 Vitamin B12 723 CBC within normal limits BMET with Cr 1.34, BUN 28 Troponin negative Orthostatic Vital Signs Negative  Pertinent Imaging: Echocardiogram: EF [id number], grade 2 diastolic dysfunction Vascular US: 1-39% ICA plaquing. Vertebral artery flow is antegrade. EEG: Normal MRI Head: No acute intracranial process. Moderate to advanced chronic microvascular ischemic disease. Few punctate chronic microhemorrhages involving the bilateral thalami and right occipital lobe, most likely related to chronic underlying HTN.  MRA Head: Distal small vessel atheromatous irregularity within the MCA and PCA branches bilaterally, other negative. No large vessel occlusion. No high-grade or correctable stenosis.   Impression: Syncope; however, LOC Dickenson also be due to concussion sustained with the fall.  Recommendations: 1) Work up unremarkable so far: No evidence of TIA, CVA, Orthostatic Hypotension, Seizure, Infection, Hypo/hyperthryoidism, drug-induced, anemia, carotid stenosis, heart failure, valvular abnormality or cardiac event.  2) Agree with aggressive management for underlying vascular disease- high intensity statin and ASA therapy 3) Given unclear etiology of syncope and possible concussion, we have discussed with the patient that he should not drive for 4 weeks. Seizure is unlikely, therefore driving restrictions do not need to be for 6 months. 4) Patient should follow up with PCP   Martyn Malay, DO PGY-3 Internal Medicine Resident Pager # [phone] 12/29/2015 2:04 PM

## 2015-12-29 NOTE — Progress Notes (Signed)
  Echocardiogram 2D Echocardiogram has been performed.  Jennette Dubin 12/29/2015, 9:08 AM

## 2015-12-29 NOTE — Care Management Obs Status (Signed)
[place] NOTIFICATION   Patient Details  Name: Randall Reeves MRN: [id number] Date of Birth: Apr 17, 1945   Medicare Observation Status Notification Given:  Yes (MRI negative)    Pollie Friar, RN 12/29/2015, 11:18 AM

## 2015-12-29 NOTE — Progress Notes (Signed)
VASCULAR LAB PRELIMINARY  PRELIMINARY  PRELIMINARY  PRELIMINARY  Carotid duplex completed.    Preliminary report:  1-39% ICA plaquing. Vertebral artery flow is antegrade.  Zidane Renner, RVT 12/29/2015, 8:31 AM

## 2015-12-29 NOTE — Progress Notes (Signed)
EEG completed; results pending.    

## 2015-12-29 NOTE — Discharge Summary (Addendum)
PATIENT DETAILS Name: Kiron Eggum Rasnick Age: 70 y.o. Sex: male Date of Birth: [birth year] MRN: [id number]. Admitting Physician: Ivor Costa, MD [place]:[id number] Silvio Pate, MD  Admit Date: 12/28/2015 Discharge date: 12/29/2015  Recommendations for Outpatient Follow-up:  1. Follow up with PCP in 1-2 weeks 2. Please obtain BMP/CBC in one week 3. Repeat Lipid panel in 3 months 4. Ensure follow up with Neurology  Admitted From:  Home  Disposition: [place]: No  Equipment/Devices: None  Discharge Condition: Stable  CODE STATUS: FULL CODE  Diet recommendation:  Heart Healthy  Brief Summary: See H&P, Labs, Consult and Test reports for all details in brief, Patient is a 70 y.o. male with past medical history of hypertension, dyslipidemia who presented to the hospital following a possible syncopal episode, this was followed by a transient episode of aphasia and confusion. He was subsequently admitted for further evaluation and treatment  Brief Hospital Course: ?Syncope: Possible loss of consciousness when he fell in the bathroom after urinating, patient is not sure if he actually passed out. I suspect this was probably neurocardiogenic mediated,orthostatic vitals were negative.  He did have prodromal symptoms of lightheadedness before he fell/syncopized. EKG/telemetry-negative,EEG did not show seizure activity, echocardiogram showed preserved EF. Follow up with primary care M.D., neurology recommends that patient not drive for the next few weeks until cleared by patient's primary care M.D.   TIA: Not sure if patient had TIA or cerebral hypoperfusion due to syncope/?pre-syncope. MRI brain negative for acute CVA. Echo negative for embolic source, EF was preserved. Carotid Doppler did not show any significant stenosis. LDL elevated at 108,  started high intensity statin. A1c 5.7. Completely nonfocal exam. Will discharge on ASA 325 mg, spoke with Dr. Leonel Ramsay over the phone-no further  recommendations from neurology-stable for discharge.   [id number] Losartan   GERD:continue PPI  CKD stage [id number] close to usual baseline-follow  Procedures/Studies: None  Discharge Diagnoses:  Principal Problem:   Syncope Active Problems:   Hyperlipemia   Essential hypertension, benign   GERD   CAD (coronary artery disease)   Fall   CKD (chronic kidney disease), stage III   Aphasia   TIA (transient ischemic attack)   Discharge Instructions:  Activity:  As tolerated with Full fall precautions use walker/cane & assistance as needed   Discharge Instructions    Ambulatory referral to Neurology    Complete by:  As directed    Diet - low sodium heart healthy    Complete by:  As directed    Discharge instructions    Complete by:  As directed    Follow with Primary MD  Viviana Simpler, MD  and Dr Erlinda Hong (Neurology)  Please do not drive, operate heavy machinery, or participate in activities at [facility] for the next few weeks, until cleared by your primary care M.D.  Please get a complete blood count and chemistry panel checked by your Primary MD at your next visit, and again as instructed by your Primary MD.  Get Medicines reviewed and adjusted: Please take all your medications with you for your next visit with your Primary MD  Laboratory/radiological data: Please request your Primary MD to go over all hospital tests and procedure/radiological results at the follow up, please ask your Primary MD to get all Hospital records sent to his/her office.  In some cases, they will be blood work, cultures and biopsy results pending at the time of your discharge. Please request that your primary care M.D. follows up on these results.  Also Note the following: If you experience worsening of your admission symptoms, develop shortness of breath, life threatening emergency, suicidal or homicidal thoughts you must seek medical attention immediately by calling 911 or  calling your MD immediately  if symptoms less severe.  You must read complete instructions/literature along with all the possible adverse reactions/side effects for all the Medicines you take and that have been prescribed to you. Take any new Medicines after you have completely understood and accpet all the possible adverse reactions/side effects.   Do not drive when taking Pain medications or sleeping medications (Benzodaizepines)  Do not take more than prescribed Pain, Sleep and Anxiety Medications. It is not advisable to combine anxiety,sleep and pain medications without talking with your primary care practitioner  Special Instructions: If you have smoked or chewed Tobacco  in the last 2 yrs please stop smoking, stop any regular Alcohol  and or any Recreational drug use.  Wear Seat belts while driving.  Please note: You were cared for by a hospitalist during your hospital stay. Once you are discharged, your primary care physician will handle any further medical issues. Please note that NO REFILLS for any discharge medications will be authorized once you are discharged, as it is imperative that you return to your primary care physician (or establish a relationship with a primary care physician if you do not have one) for your post hospital discharge needs so that they can reassess your need for medications and monitor your lab values.   Increase activity slowly    Complete by:  As directed        Medication List    STOP taking these medications   aspirin 81 MG chewable tablet Replaced by:  aspirin EC 325 MG tablet     TAKE these medications   aspirin EC 325 MG tablet Take 1 tablet (325 mg total) by mouth daily. Start taking on:  12/30/2015 Replaces:  aspirin 81 MG chewable tablet   atorvastatin 40 MG tablet Commonly known as:  LIPITOR Take 1 tablet (40 mg total) by mouth daily at 6 PM.   hydrocortisone 2.5 % cream Apply topically 3 (three) times daily as needed.   losartan 50  MG tablet Commonly known as:  COZAAR take 1 tablet by mouth once daily   Lutein-Zeaxanthin 20-0.8 MG Caps Take 1 capsule by mouth daily.   multivitamin tablet Take 1 tablet by mouth daily.   omeprazole 20 MG capsule Commonly known as:  PRILOSEC Take 20 mg by mouth daily.   tadalafil 5 MG tablet Commonly known as:  CIALIS Take 1 tablet (5 mg total) by mouth daily.      Follow-up Information    Viviana Simpler, MD. Schedule an appointment as soon as possible for a visit in 2 week(s).   Specialties:  Internal Medicine, Pediatrics Contact information: [place] [address] [phone]        Xu,Jindong, MD Follow up.   Specialty:  Neurology Why:  office will call you for a follow up appoitnment-if you do not hear from them, please give them a call. Contact information: [address] [place] [address] [phone]          Allergies  Allergen Reactions  . Bisoprolol-Hydrochlorothiazide     REACTION: muscle pain, cramps  . Codeine Nausea Only    Consultations:   neurology  Other Procedures/Studies: Ct Head Wo Contrast  Result Date: 12/28/2015 CLINICAL DATA:  Initial evaluation for acute trauma, fall. EXAM:  CT HEAD WITHOUT CONTRAST CT CERVICAL SPINE WITHOUT CONTRAST TECHNIQUE: Multidetector CT imaging of the head and cervical spine was performed following the standard protocol without intravenous contrast. Multiplanar CT image reconstructions of the cervical spine were also generated. COMPARISON:  Prior study from 11/24/2012. FINDINGS: CT HEAD FINDINGS Brain: Age-related cerebral atrophy with moderate chronic microvascular ischemic disease. No acute intracranial hemorrhage. No findings to suggest acute large vessel territory infarct. No mass lesion, midline shift, or mass effect. No hydrocephalus. No extra-axial fluid collection. Vascular: No hyperdense vessel. Skull: Scalp soft tissues within normal limits.  Calvarium  intact. Sinuses/Orbits: Globes and orbital soft tissues within normal limits. Mild scattered mucosal thickening within the ethmoidal air cells and sphenoid sinuses. Paranasal sinuses are otherwise clear. No mastoid effusion. CT CERVICAL SPINE FINDINGS Alignment: Vertebral bodies normally aligned with preservation of the normal cervical lordosis. No listhesis. Skull base and vertebrae: Skullbase intact. Normal C1-2 articulations preserved. Dens is intact. Vertebral body heights maintained. No acute fracture identified. Soft tissues and spinal canal: Visualized soft tissues of the neck demonstrate no acute abnormality. No prevertebral edema. Calcified tonsilliths noted. Disc levels: Moderate degenerative spondylolysis as evidenced by intervertebral disc space narrowing, endplate sclerosis, and osteophytosis present at C5-6 and C6-7. Upper chest: Visualized upper mediastinum within normal limits. Visualized lung apices are clear. IMPRESSION: 1. No acute intracranial process. 2. Age-related cerebral atrophy with moderate chronic microvascular ischemic disease. 3. No acute traumatic injury within the cervical spine. Electronically Signed   By: Jeannine Boga M.D.   On: 12/28/2015 01:38   Ct Cervical Spine Wo Contrast  Result Date: 12/28/2015 CLINICAL DATA:  Initial evaluation for acute trauma, fall. EXAM: CT HEAD WITHOUT CONTRAST CT CERVICAL SPINE WITHOUT CONTRAST TECHNIQUE: Multidetector CT imaging of the head and cervical spine was performed following the standard protocol without intravenous contrast. Multiplanar CT image reconstructions of the cervical spine were also generated. COMPARISON:  Prior study from 11/24/2012. FINDINGS: CT HEAD FINDINGS Brain: Age-related cerebral atrophy with moderate chronic microvascular ischemic disease. No acute intracranial hemorrhage. No findings to suggest acute large vessel territory infarct. No mass lesion, midline shift, or mass effect. No hydrocephalus. No extra-axial  fluid collection. Vascular: No hyperdense vessel. Skull: Scalp soft tissues within normal limits.  Calvarium intact. Sinuses/Orbits: Globes and orbital soft tissues within normal limits. Mild scattered mucosal thickening within the ethmoidal air cells and sphenoid sinuses. Paranasal sinuses are otherwise clear. No mastoid effusion. CT CERVICAL SPINE FINDINGS Alignment: Vertebral bodies normally aligned with preservation of the normal cervical lordosis. No listhesis. Skull base and vertebrae: Skullbase intact. Normal C1-2 articulations preserved. Dens is intact. Vertebral body heights maintained. No acute fracture identified. Soft tissues and spinal canal: Visualized soft tissues of the neck demonstrate no acute abnormality. No prevertebral edema. Calcified tonsilliths noted. Disc levels: Moderate degenerative spondylolysis as evidenced by intervertebral disc space narrowing, endplate sclerosis, and osteophytosis present at C5-6 and C6-7. Upper chest: Visualized upper mediastinum within normal limits. Visualized lung apices are clear. IMPRESSION: 1. No acute intracranial process. 2. Age-related cerebral atrophy with moderate chronic microvascular ischemic disease. 3. No acute traumatic injury within the cervical spine. Electronically Signed   By: Jeannine Boga M.D.   On: 12/28/2015 01:38   Mr Jodene Nam Head Wo Contrast  Result Date: 12/28/2015 CLINICAL DATA:  Initial evaluation for acute fall, confusion. EXAM: MRI HEAD WITHOUT CONTRAST MRA HEAD WITHOUT CONTRAST TECHNIQUE: Multiplanar, multiecho pulse sequences of the brain and surrounding structures were obtained without intravenous contrast. Angiographic images of the head were obtained using  MRA technique without contrast. COMPARISON:  Prior CT from earlier same day. FINDINGS: MRI HEAD FINDINGS Brain: Diffuse prominence of the CSF containing spaces is compatible with generalized cerebral atrophy. Patchy and confluent T2/FLAIR hyperintensity involving the  periventricular, deep, and subcortical white matter both cerebral hemispheres most likely related to chronic microvascular ischemic disease, moderate to advanced in nature. Chronic microvascular ischemic changes present within the pons as well. No abnormal foci of restricted diffusion to suggest acute or subacute ischemia. Gray-white matter differentiation is maintained. No evidence for acute intracranial hemorrhage. Punctate chronic micro hemorrhage noted within the right occipital lobe. Additional small chronic micro hemorrhages noted within the bilateral thalami. Findings likely related of chronic underlying hypertension. No remote cortical infarcts identified. No mass lesion, midline shift or mass effect. No hydrocephalus. No extra-axial fluid collection. Major dural sinuses are grossly patent. Pituitary gland and suprasellar region within normal limits. Midline structures intact. Vascular: Major intracranial vascular flow voids are maintained. Skull and upper cervical spine: Craniocervical junction normal. Visualized upper cervical spine unremarkable. Bone marrow signal intensity normal. No scalp soft tissue abnormality. Sinuses/Orbits: Globes and orbital soft tissues within normal limits. Mild scattered mucosal thickening within the ethmoidal air cells. Paranasal sinuses are otherwise clear. No mastoid effusion. Inner ear structures normal. MRA HEAD FINDINGS ANTERIOR CIRCULATION: Distal cervical segments of the internal carotid arteries are patent with antegrade flow. Petrous, cavernous, and supraclinoid segments are widely patent without flow-limiting stenosis. A1 segments patent. Anterior communicating artery normal. Anterior cerebral arteries patent to their distal aspects. M1 segments patent without stenosis or occlusion. MCA bifurcations normal. Distal MCA branches well opacified and symmetric. Mild distal small vessel atheromatous irregularity within the MCA branches bilaterally. POSTERIOR CIRCULATION:  Vertebral arteries largely code dominant and patent to the vertebrobasilar junction. Posterior inferior cerebral arteries patent proximally. Basilar artery widely patent. Superior cerebral arteries patent bilaterally. Posterior cerebral arteries largely supplied via the basilar artery and are well opacified to their distal aspects. Distal small vessel atheromatous irregularity within the PCA branches bilaterally. No aneurysm or vascular malformation. IMPRESSION: MRI HEAD IMPRESSION: 1. No acute intracranial process. 2. Moderate to advanced chronic microvascular ischemic disease. 3. Few punctate chronic micro hemorrhages involving the bilateral thalami and right occipital lobe, most likely related to chronic underlying hypertension. MRA HEAD IMPRESSION: 1. Distal small vessel atheromatous irregularity within the MCA and PCA branches bilaterally. 2. Otherwise negative intracranial MRA. No large vessel occlusion. No high-grade or correctable stenosis. Electronically Signed   By: Jeannine Boga M.D.   On: 12/28/2015 06:17   Mri Brain Without Contrast  Result Date: 12/28/2015 CLINICAL DATA:  Initial evaluation for acute fall, confusion. EXAM: MRI HEAD WITHOUT CONTRAST MRA HEAD WITHOUT CONTRAST TECHNIQUE: Multiplanar, multiecho pulse sequences of the brain and surrounding structures were obtained without intravenous contrast. Angiographic images of the head were obtained using MRA technique without contrast. COMPARISON:  Prior CT from earlier same day. FINDINGS: MRI HEAD FINDINGS Brain: Diffuse prominence of the CSF containing spaces is compatible with generalized cerebral atrophy. Patchy and confluent T2/FLAIR hyperintensity involving the periventricular, deep, and subcortical white matter both cerebral hemispheres most likely related to chronic microvascular ischemic disease, moderate to advanced in nature. Chronic microvascular ischemic changes present within the pons as well. No abnormal foci of restricted  diffusion to suggest acute or subacute ischemia. Gray-white matter differentiation is maintained. No evidence for acute intracranial hemorrhage. Punctate chronic micro hemorrhage noted within the right occipital lobe. Additional small chronic micro hemorrhages noted within the bilateral thalami. Findings likely related  of chronic underlying hypertension. No remote cortical infarcts identified. No mass lesion, midline shift or mass effect. No hydrocephalus. No extra-axial fluid collection. Major dural sinuses are grossly patent. Pituitary gland and suprasellar region within normal limits. Midline structures intact. Vascular: Major intracranial vascular flow voids are maintained. Skull and upper cervical spine: Craniocervical junction normal. Visualized upper cervical spine unremarkable. Bone marrow signal intensity normal. No scalp soft tissue abnormality. Sinuses/Orbits: Globes and orbital soft tissues within normal limits. Mild scattered mucosal thickening within the ethmoidal air cells. Paranasal sinuses are otherwise clear. No mastoid effusion. Inner ear structures normal. MRA HEAD FINDINGS ANTERIOR CIRCULATION: Distal cervical segments of the internal carotid arteries are patent with antegrade flow. Petrous, cavernous, and supraclinoid segments are widely patent without flow-limiting stenosis. A1 segments patent. Anterior communicating artery normal. Anterior cerebral arteries patent to their distal aspects. M1 segments patent without stenosis or occlusion. MCA bifurcations normal. Distal MCA branches well opacified and symmetric. Mild distal small vessel atheromatous irregularity within the MCA branches bilaterally. POSTERIOR CIRCULATION: Vertebral arteries largely code dominant and patent to the vertebrobasilar junction. Posterior inferior cerebral arteries patent proximally. Basilar artery widely patent. Superior cerebral arteries patent bilaterally. Posterior cerebral arteries largely supplied via the  basilar artery and are well opacified to their distal aspects. Distal small vessel atheromatous irregularity within the PCA branches bilaterally. No aneurysm or vascular malformation. IMPRESSION: MRI HEAD IMPRESSION: 1. No acute intracranial process. 2. Moderate to advanced chronic microvascular ischemic disease. 3. Few punctate chronic micro hemorrhages involving the bilateral thalami and right occipital lobe, most likely related to chronic underlying hypertension. MRA HEAD IMPRESSION: 1. Distal small vessel atheromatous irregularity within the MCA and PCA branches bilaterally. 2. Otherwise negative intracranial MRA. No large vessel occlusion. No high-grade or correctable stenosis. Electronically Signed   By: Jeannine Boga M.D.   On: 12/28/2015 06:17     TODAY-DAY OF DISCHARGE:  Subjective:   [place]ier [place] today has no headache,no chest abdominal pain,no new weakness tingling or numbness, feels much better wants to go home today.  Objective:   Blood pressure 120/82, pulse 74, temperature 98.1 F (36.7 C), temperature source Oral, resp. rate 19, SpO2 98 %. No intake or output data in the 24 hours ending 12/29/15 1314 There were no vitals filed for this visit.  Exam: Awake Alert, Oriented *3, No new F.N deficits, Normal affect [place].AT,PERRAL Supple Neck,No JVD, No cervical lymphadenopathy appriciated.  Symmetrical Chest wall movement, Good air movement bilaterally, CTAB RRR,No Gallops,Rubs or new Murmurs, No Parasternal Heave +ve B.Sounds, Abd Soft, Non tender, No organomegaly appriciated, No rebound -guarding or rigidity. No Cyanosis, Clubbing or edema, No new Rash or bruise   PERTINENT RADIOLOGIC STUDIES: Ct Head Wo Contrast  Result Date: 12/28/2015 CLINICAL DATA:  Initial evaluation for acute trauma, fall. EXAM: CT HEAD WITHOUT CONTRAST CT CERVICAL SPINE WITHOUT CONTRAST TECHNIQUE: Multidetector CT imaging of the head and cervical spine was performed following the standard protocol  without intravenous contrast. Multiplanar CT image reconstructions of the cervical spine were also generated. COMPARISON:  Prior study from 11/24/2012. FINDINGS: CT HEAD FINDINGS Brain: Age-related cerebral atrophy with moderate chronic microvascular ischemic disease. No acute intracranial hemorrhage. No findings to suggest acute large vessel territory infarct. No mass lesion, midline shift, or mass effect. No hydrocephalus. No extra-axial fluid collection. Vascular: No hyperdense vessel. Skull: Scalp soft tissues within normal limits.  Calvarium intact. Sinuses/Orbits: Globes and orbital soft tissues within normal limits. Mild scattered mucosal thickening within the ethmoidal air cells and sphenoid sinuses. Paranasal  sinuses are otherwise clear. No mastoid effusion. CT CERVICAL SPINE FINDINGS Alignment: Vertebral bodies normally aligned with preservation of the normal cervical lordosis. No listhesis. Skull base and vertebrae: Skullbase intact. Normal C1-2 articulations preserved. Dens is intact. Vertebral body heights maintained. No acute fracture identified. Soft tissues and spinal canal: Visualized soft tissues of the neck demonstrate no acute abnormality. No prevertebral edema. Calcified tonsilliths noted. Disc levels: Moderate degenerative spondylolysis as evidenced by intervertebral disc space narrowing, endplate sclerosis, and osteophytosis present at C5-6 and C6-7. Upper chest: Visualized upper mediastinum within normal limits. Visualized lung apices are clear. IMPRESSION: 1. No acute intracranial process. 2. Age-related cerebral atrophy with moderate chronic microvascular ischemic disease. 3. No acute traumatic injury within the cervical spine. Electronically Signed   By: Jeannine Boga M.D.   On: 12/28/2015 01:38   Ct Cervical Spine Wo Contrast  Result Date: 12/28/2015 CLINICAL DATA:  Initial evaluation for acute trauma, fall. EXAM: CT HEAD WITHOUT CONTRAST CT CERVICAL SPINE WITHOUT CONTRAST  TECHNIQUE: Multidetector CT imaging of the head and cervical spine was performed following the standard protocol without intravenous contrast. Multiplanar CT image reconstructions of the cervical spine were also generated. COMPARISON:  Prior study from 11/24/2012. FINDINGS: CT HEAD FINDINGS Brain: Age-related cerebral atrophy with moderate chronic microvascular ischemic disease. No acute intracranial hemorrhage. No findings to suggest acute large vessel territory infarct. No mass lesion, midline shift, or mass effect. No hydrocephalus. No extra-axial fluid collection. Vascular: No hyperdense vessel. Skull: Scalp soft tissues within normal limits.  Calvarium intact. Sinuses/Orbits: Globes and orbital soft tissues within normal limits. Mild scattered mucosal thickening within the ethmoidal air cells and sphenoid sinuses. Paranasal sinuses are otherwise clear. No mastoid effusion. CT CERVICAL SPINE FINDINGS Alignment: Vertebral bodies normally aligned with preservation of the normal cervical lordosis. No listhesis. Skull base and vertebrae: Skullbase intact. Normal C1-2 articulations preserved. Dens is intact. Vertebral body heights maintained. No acute fracture identified. Soft tissues and spinal canal: Visualized soft tissues of the neck demonstrate no acute abnormality. No prevertebral edema. Calcified tonsilliths noted. Disc levels: Moderate degenerative spondylolysis as evidenced by intervertebral disc space narrowing, endplate sclerosis, and osteophytosis present at C5-6 and C6-7. Upper chest: Visualized upper mediastinum within normal limits. Visualized lung apices are clear. IMPRESSION: 1. No acute intracranial process. 2. Age-related cerebral atrophy with moderate chronic microvascular ischemic disease. 3. No acute traumatic injury within the cervical spine. Electronically Signed   By: Jeannine Boga M.D.   On: 12/28/2015 01:38   Mr Jodene Nam Head Wo Contrast  Result Date: 12/28/2015 CLINICAL DATA:   Initial evaluation for acute fall, confusion. EXAM: MRI HEAD WITHOUT CONTRAST MRA HEAD WITHOUT CONTRAST TECHNIQUE: Multiplanar, multiecho pulse sequences of the brain and surrounding structures were obtained without intravenous contrast. Angiographic images of the head were obtained using MRA technique without contrast. COMPARISON:  Prior CT from earlier same day. FINDINGS: MRI HEAD FINDINGS Brain: Diffuse prominence of the CSF containing spaces is compatible with generalized cerebral atrophy. Patchy and confluent T2/FLAIR hyperintensity involving the periventricular, deep, and subcortical white matter both cerebral hemispheres most likely related to chronic microvascular ischemic disease, moderate to advanced in nature. Chronic microvascular ischemic changes present within the pons as well. No abnormal foci of restricted diffusion to suggest acute or subacute ischemia. Gray-white matter differentiation is maintained. No evidence for acute intracranial hemorrhage. Punctate chronic micro hemorrhage noted within the right occipital lobe. Additional small chronic micro hemorrhages noted within the bilateral thalami. Findings likely related of chronic underlying hypertension. No remote cortical  infarcts identified. No mass lesion, midline shift or mass effect. No hydrocephalus. No extra-axial fluid collection. Major dural sinuses are grossly patent. Pituitary gland and suprasellar region within normal limits. Midline structures intact. Vascular: Major intracranial vascular flow voids are maintained. Skull and upper cervical spine: Craniocervical junction normal. Visualized upper cervical spine unremarkable. Bone marrow signal intensity normal. No scalp soft tissue abnormality. Sinuses/Orbits: Globes and orbital soft tissues within normal limits. Mild scattered mucosal thickening within the ethmoidal air cells. Paranasal sinuses are otherwise clear. No mastoid effusion. Inner ear structures normal. MRA HEAD FINDINGS  ANTERIOR CIRCULATION: Distal cervical segments of the internal carotid arteries are patent with antegrade flow. Petrous, cavernous, and supraclinoid segments are widely patent without flow-limiting stenosis. A1 segments patent. Anterior communicating artery normal. Anterior cerebral arteries patent to their distal aspects. M1 segments patent without stenosis or occlusion. MCA bifurcations normal. Distal MCA branches well opacified and symmetric. Mild distal small vessel atheromatous irregularity within the MCA branches bilaterally. POSTERIOR CIRCULATION: Vertebral arteries largely code dominant and patent to the vertebrobasilar junction. Posterior inferior cerebral arteries patent proximally. Basilar artery widely patent. Superior cerebral arteries patent bilaterally. Posterior cerebral arteries largely supplied via the basilar artery and are well opacified to their distal aspects. Distal small vessel atheromatous irregularity within the PCA branches bilaterally. No aneurysm or vascular malformation. IMPRESSION: MRI HEAD IMPRESSION: 1. No acute intracranial process. 2. Moderate to advanced chronic microvascular ischemic disease. 3. Few punctate chronic micro hemorrhages involving the bilateral thalami and right occipital lobe, most likely related to chronic underlying hypertension. MRA HEAD IMPRESSION: 1. Distal small vessel atheromatous irregularity within the MCA and PCA branches bilaterally. 2. Otherwise negative intracranial MRA. No large vessel occlusion. No high-grade or correctable stenosis. Electronically Signed   By: Jeannine Boga M.D.   On: 12/28/2015 06:17   Mri Brain Without Contrast  Result Date: 12/28/2015 CLINICAL DATA:  Initial evaluation for acute fall, confusion. EXAM: MRI HEAD WITHOUT CONTRAST MRA HEAD WITHOUT CONTRAST TECHNIQUE: Multiplanar, multiecho pulse sequences of the brain and surrounding structures were obtained without intravenous contrast. Angiographic images of the head were  obtained using MRA technique without contrast. COMPARISON:  Prior CT from earlier same day. FINDINGS: MRI HEAD FINDINGS Brain: Diffuse prominence of the CSF containing spaces is compatible with generalized cerebral atrophy. Patchy and confluent T2/FLAIR hyperintensity involving the periventricular, deep, and subcortical white matter both cerebral hemispheres most likely related to chronic microvascular ischemic disease, moderate to advanced in nature. Chronic microvascular ischemic changes present within the pons as well. No abnormal foci of restricted diffusion to suggest acute or subacute ischemia. Gray-white matter differentiation is maintained. No evidence for acute intracranial hemorrhage. Punctate chronic micro hemorrhage noted within the right occipital lobe. Additional small chronic micro hemorrhages noted within the bilateral thalami. Findings likely related of chronic underlying hypertension. No remote cortical infarcts identified. No mass lesion, midline shift or mass effect. No hydrocephalus. No extra-axial fluid collection. Major dural sinuses are grossly patent. Pituitary gland and suprasellar region within normal limits. Midline structures intact. Vascular: Major intracranial vascular flow voids are maintained. Skull and upper cervical spine: Craniocervical junction normal. Visualized upper cervical spine unremarkable. Bone marrow signal intensity normal. No scalp soft tissue abnormality. Sinuses/Orbits: Globes and orbital soft tissues within normal limits. Mild scattered mucosal thickening within the ethmoidal air cells. Paranasal sinuses are otherwise clear. No mastoid effusion. Inner ear structures normal. MRA HEAD FINDINGS ANTERIOR CIRCULATION: Distal cervical segments of the internal carotid arteries are patent with antegrade flow. Petrous, cavernous, and supraclinoid  segments are widely patent without flow-limiting stenosis. A1 segments patent. Anterior communicating artery normal. Anterior  cerebral arteries patent to their distal aspects. M1 segments patent without stenosis or occlusion. MCA bifurcations normal. Distal MCA branches well opacified and symmetric. Mild distal small vessel atheromatous irregularity within the MCA branches bilaterally. POSTERIOR CIRCULATION: Vertebral arteries largely code dominant and patent to the vertebrobasilar junction. Posterior inferior cerebral arteries patent proximally. Basilar artery widely patent. Superior cerebral arteries patent bilaterally. Posterior cerebral arteries largely supplied via the basilar artery and are well opacified to their distal aspects. Distal small vessel atheromatous irregularity within the PCA branches bilaterally. No aneurysm or vascular malformation. IMPRESSION: MRI HEAD IMPRESSION: 1. No acute intracranial process. 2. Moderate to advanced chronic microvascular ischemic disease. 3. Few punctate chronic micro hemorrhages involving the bilateral thalami and right occipital lobe, most likely related to chronic underlying hypertension. MRA HEAD IMPRESSION: 1. Distal small vessel atheromatous irregularity within the MCA and PCA branches bilaterally. 2. Otherwise negative intracranial MRA. No large vessel occlusion. No high-grade or correctable stenosis. Electronically Signed   By: Jeannine Boga M.D.   On: 12/28/2015 06:17     PERTINENT LAB RESULTS: CBC:  Recent Labs  12/28/15 0035 12/28/15 0438  WBC 5.3 6.9  HGB 14.4 14.3  HCT 42.1 42.0  PLT 169 215   CMET CMP     Component Value Date/Time   NA 136 12/28/2015 0438   K 4.1 12/28/2015 0438   CL 104 12/28/2015 0438   CO2 23 12/28/2015 0438   GLUCOSE 138 (H) 12/28/2015 0438   BUN 28 (H) 12/28/2015 0438   CREATININE 1.34 (H) 12/28/2015 0438   CREATININE 1.38 (H) 01/10/2014 1121   CALCIUM 9.1 12/28/2015 0438   PROT 6.6 12/28/2015 0035   PROT 7.5 09/04/2014 1127   ALBUMIN 3.8 12/28/2015 0035   AST 24 12/28/2015 0035   ALT 21 12/28/2015 0035   ALKPHOS 64  12/28/2015 0035   BILITOT 0.5 12/28/2015 0035   GFRNONAA 53 (L) 12/28/2015 0438   GFRAA >60 12/28/2015 0438    GFR CrCl cannot be calculated (Unknown ideal weight.). No results for input(s): LIPASE, AMYLASE in the last 72 hours. No results for input(s): CKTOTAL, CKMB, CKMBINDEX, TROPONINI in the last 72 hours. Invalid input(s): POCBNP No results for input(s): DDIMER in the last 72 hours.  Recent Labs  12/28/15 0438  HGBA1C 5.7*    Recent Labs  12/28/15 0439  CHOL 191  HDL 43  LDLCALC 108*  TRIG 199*  CHOLHDL 4.4    Recent Labs  12/28/15 0649  TSH 2.983    Recent Labs  12/28/15 0438  VITAMINB12 723   Coags:  Recent Labs  12/28/15 0438  INR 0.98   Microbiology: Recent Results (from the past 240 hour(s))  MRSA PCR Screening     Status: None   Collection Time: 12/28/15  5:31 PM  Result Value Ref Range Status   MRSA by PCR NEGATIVE NEGATIVE Final    Comment:        The GeneXpert MRSA Assay (FDA approved for NASAL specimens only), is one component of a comprehensive MRSA colonization surveillance program. It is not intended to diagnose MRSA infection nor to guide or monitor treatment for MRSA infections.     FURTHER DISCHARGE INSTRUCTIONS:  Get Medicines reviewed and adjusted: Please take all your medications with you for your next visit with your Primary MD  Laboratory/radiological data: Please request your Primary MD to go over all hospital tests and procedure/radiological results at  the follow up, please ask your Primary MD to get all Hospital records sent to his/her office.  In some cases, they will be blood work, cultures and biopsy results pending at the time of your discharge. Please request that your primary care M.D. goes through all the records of your hospital data and follows up on these results.  Also Note the following: If you experience worsening of your admission symptoms, develop shortness of breath, life threatening emergency,  suicidal or homicidal thoughts you must seek medical attention immediately by calling 911 or calling your MD immediately  if symptoms less severe.  You must read complete instructions/literature along with all the possible adverse reactions/side effects for all the Medicines you take and that have been prescribed to you. Take any new Medicines after you have completely understood and accpet all the possible adverse reactions/side effects.   Do not drive when taking Pain medications or sleeping medications (Benzodaizepines)  Do not take more than prescribed Pain, Sleep and Anxiety Medications. It is not advisable to combine anxiety,sleep and pain medications without talking with your primary care practitioner  Special Instructions: If you have smoked or chewed Tobacco  in the last 2 yrs please stop smoking, stop any regular Alcohol  and or any Recreational drug use.  Wear Seat belts while driving.  Please note: You were cared for by a hospitalist during your hospital stay. Once you are discharged, your primary care physician will handle any further medical issues. Please note that NO REFILLS for any discharge medications will be authorized once you are discharged, as it is imperative that you return to your primary care physician (or establish a relationship with a primary care physician if you do not have one) for your post hospital discharge needs so that they can reassess your need for medications and monitor your lab values.  Total Time spent coordinating discharge including counseling, education and face to face time equals 45 minutes.  SignedOren Binet 12/29/2015 1:14 PM

## 2015-12-29 NOTE — Progress Notes (Signed)
Understands d/c instr. Home now via wc to car w/wife.

## 2015-12-29 NOTE — Care Management Note (Signed)
Case Management Note  Patient Details  Name: Randall Reeves MRN: [id number] Date of Birth: 09-23-1945  Subjective/Objective:    Pt in with syncope. He is from home with his wife.                Action/Plan: No f/u per PT or DME needs. No further CM needs.   Expected Discharge Date:                  Expected Discharge Plan:  Home/Self Care  In-House Referral:     Discharge planning Services     Post Acute Care Choice:    Choice offered to:     DME Arranged:    DME Agency:     HH Arranged:    [place] Agency:     Status of Service:  Completed, signed off  If discussed at [employer] of Stay Meetings, dates discussed:    Additional Comments:  Pollie Friar, RN 12/29/2015, 2:19 PM

## 2015-12-29 NOTE — Procedures (Signed)
ELECTROENCEPHALOGRAM REPORT  Date of Study: 12/29/2015  Patient's Name: Randall Reeves MRN: [id number] Date of Birth: 01/27/1945  Referring Provider: Ivor Costa, MD  Clinical History: 70 year old man with fall and confusion.  Medications: L1 acetaminophen (TYLENOL) suppository 650 mg  L1 acetaminophen (TYLENOL) tablet 650 mg   aspirin EC tablet 81 mg   atorvastatin (LIPITOR) tablet 40 mg   enoxaparin (LOVENOX) injection 40 mg   hydrALAZINE (APRESOLINE) injection 5 mg   multivitamin with minerals tablet 1 tablet  L2 ondansetron (ZOFRAN) injection 4 mg  L2 ondansetron (ZOFRAN) tablet 4 mg   pantoprazole (PROTONIX) EC tablet 40 mg   sodium chloride flush (NS) 0.9 % injection 3 mL   zolpidem (AMBIEN) tablet 5 mg  Technical Summary: A multichannel digital EEG recording measured by the international 10-20 system with electrodes applied with paste and impedances below 5000 ohms performed in our laboratory with EKG monitoring in an awake and drowsy patient.  Hyperventilation and photic stimulation were performed.  The digital EEG was referentially recorded, reformatted, and digitally filtered in a variety of bipolar and referential montages for optimal display.    Description: The patient is awake and drowsy during the recording.  During maximal wakefulness, there is a symmetric, medium voltage 10 Hz posterior dominant rhythm that attenuates with eye opening.  The record is symmetric.  During drowsiness and sleep, there is an increase in theta slowing of the background.  Stage 2 sleep was not seen.  Hyperventilation and photic stimulation did not elicit any abnormalities.  There were no epileptiform discharges or electrographic seizures seen.    EKG lead was unremarkable.  Impression: This awake and drowsy EEG is normal.    Clinical Correlation: A normal EEG does not exclude a clinical diagnosis of epilepsy.  If further clinical questions remain, prolonged EEG Ostrum be helpful.  Clinical  correlation is advised.   Metta Clines, DO

## 2015-12-30 ENCOUNTER — Telehealth: Payer: Self-pay

## 2015-12-30 NOTE — Telephone Encounter (Signed)
Transition Care Management Follow-up Telephone Call    Date discharged? 12/29/2015  How have you been since you were released from the hospital? [place].  Any patient concerns? Pt asked what discharge summary stated about driving restrictions. Per discharge summary pt must see PCP regarding driving.   Items Reviewed:  Medications reviewed: Yes  Allergies reviewed: Yes  Dietary changes reviewed: Yes  Referrals reviewed: Yes   Functional Questionnaire:  Independent - I Dependent - D    Activities of Daily Living (ADLs):    Personal hygiene - I Dressing - I Eating - I Maintaining continence - I Transferring - I   Independent Activities of Daily Living (iADLs): Basic communication skills - I Transportation - I Meal preparation  - I Shopping - I Housework - I Managing medications - D (wife) Managing personal finances - D (wife)   Confirmed importance and date/time of follow-up visits scheduled YES  Provider Appointment booked with PCP 01/02/16 @ 10:15 AM  Confirmed with patient if condition begins to worsen call PCP or go to the ER.  Patient was given the office number and encouraged to call back with question or concerns: YES

## 2016-01-02 ENCOUNTER — Ambulatory Visit (INDEPENDENT_AMBULATORY_CARE_PROVIDER_SITE_OTHER): Payer: PPO | Admitting: Internal Medicine

## 2016-01-02 ENCOUNTER — Encounter: Payer: Self-pay | Admitting: Internal Medicine

## 2016-01-02 VITALS — BP 122/78 | HR 75 | Temp 97.9°F | Wt 188.0 lb

## 2016-01-02 DIAGNOSIS — R55 Syncope and collapse: Secondary | ICD-10-CM | POA: Diagnosis not present

## 2016-01-02 NOTE — Progress Notes (Signed)
Pre visit review using our clinic review tool, if applicable. No additional management support is needed unless otherwise documented below in the visit note. 

## 2016-01-02 NOTE — Progress Notes (Signed)
Subjective:    Patient ID: Randall Reeves, male    DOB: Jul 30, 1945, 70 y.o.   MRN: [id number]  HPI Here with wife for hospital follow up Reviewed records and testing MRA unremarkable, EEG, echo, carotids all basically normal  Was in recliner asleep Got up around 11PM and wasn't walking right--but doesn't remember if dizzy or sleepy Went to bathroom--doesn't remember if he started voiding yet [address] between toilet and wall--then struggling to get up Found in doorway about 20 feet away Was diaphoretic and decreased responsiveness  Wife awoke to thud--apparently the second fall in bedroom Only said "uh uh" Started speaking more normally within about an hour or less Felt back to his normal by the ER  No chest pain No SOB No palpitations No facial droop or focal weakness No tongue bite or incontinence Did hit head --left forehead  Current Outpatient Prescriptions on File Prior to Visit  Medication Sig Dispense Refill  . aspirin EC 325 MG tablet Take 1 tablet (325 mg total) by mouth daily. 30 tablet 0  . hydrocortisone 2.5 % cream Apply topically 3 (three) times daily as needed. 56 g 3  . losartan (COZAAR) 50 MG tablet take 1 tablet by mouth once daily 90 tablet 1  . Lutein-Zeaxanthin 20-0.8 MG CAPS Take 1 capsule by mouth daily.    . Multiple Vitamin (MULTIVITAMIN) tablet Take 1 tablet by mouth daily.     Marland Kitchen omeprazole (PRILOSEC) 20 MG capsule Take 20 mg by mouth daily.    . tadalafil (CIALIS) 5 MG tablet Take 1 tablet (5 mg total) by mouth daily. 90 tablet 3   No current facility-administered medications on file prior to visit.     Allergies  Allergen Reactions  . Bisoprolol-Hydrochlorothiazide     REACTION: muscle pain, cramps  . Codeine Nausea Only    Past Medical History:  Diagnosis Date  . Allergy   . CKD (chronic kidney disease), stage III   . Coronary atherosclerosis of unspecified type of vessel, native or graft 2011   MI but cath benign. Dr Otis Brace  . GERD  (gastroesophageal reflux disease)   . Hyperlipidemia   . Hypertension   . Obstructive sleep apnea     Past Surgical History:  Procedure Laterality Date  . CARDIAC CATHETERIZATION    . CARPAL TUNNEL RELEASE    . spermatocele repair      Family History  Problem Relation Age of Onset  . Stroke Mother   . Hypertension Mother   . Diabetes Father   . Asthma Father   . Heart failure Father   . Heart disease Father   . Hypertension Sister   . Diabetes Sister   . Neuropathy Brother   . Hypertension Brother   . Cancer Brother   . Diabetes Brother   . Heart disease Brother   . Hypertension Brother   . Hypertension Brother   . Hypertension Brother   . Cancer Brother     prostate    Social History   Social History  . Marital status: Married    Spouse name: N/A  . Number of children: 2  . Years of education: N/A   Occupational History  . landscaping    Social History Main Topics  . Smoking status: Never Smoker  . Smokeless tobacco: Never Used  . Alcohol use 1.8 - 3.0 oz/week    3 - 5 Cans of beer per week     Comment: a beer a day  . Drug use:  No  . Sexual activity: Not on file   Other Topics Concern  . Not on file   Social History Narrative   Has living will   Wife is his health care POA.   Would accept resuscitation attempts.   Would leave feeding tube decision to his wife   Review of Systems  Appetite is good Sleeping okay Feels back to normal     Objective:   Physical Exam        Assessment & Plan:

## 2016-01-02 NOTE — Assessment & Plan Note (Signed)
Reviewed all records Most likely vasovagal syncope  No seizure, etc Unlikely to be TIA--but not sure with symptoms Likely had concussion with first fall and that caused the neuro changes Discussed statin--he still prefers not taking one  Discussed safety No further testing for now

## 2016-01-16 ENCOUNTER — Encounter: Payer: Self-pay | Admitting: Internal Medicine

## 2016-01-16 ENCOUNTER — Ambulatory Visit (INDEPENDENT_AMBULATORY_CARE_PROVIDER_SITE_OTHER): Payer: PPO | Admitting: Internal Medicine

## 2016-01-16 VITALS — BP 134/86 | HR 77 | Temp 98.4°F | Ht 68.5 in | Wt 183.0 lb

## 2016-01-16 DIAGNOSIS — I722 Aneurysm of renal artery: Secondary | ICD-10-CM

## 2016-01-16 DIAGNOSIS — Z0000 Encounter for general adult medical examination without abnormal findings: Secondary | ICD-10-CM

## 2016-01-16 DIAGNOSIS — I728 Aneurysm of other specified arteries: Secondary | ICD-10-CM

## 2016-01-16 DIAGNOSIS — Z7189 Other specified counseling: Secondary | ICD-10-CM

## 2016-01-16 DIAGNOSIS — R55 Syncope and collapse: Secondary | ICD-10-CM | POA: Diagnosis not present

## 2016-01-16 DIAGNOSIS — I10 Essential (primary) hypertension: Secondary | ICD-10-CM | POA: Diagnosis not present

## 2016-01-16 MED ORDER — TETANUS-DIPHTHERIA TOXOIDS TD 5-2 LFU IM INJ: 0.5000 mL | INJECTION | Freq: Once | INTRAMUSCULAR | 0 refills | Status: AC

## 2016-01-16 NOTE — Patient Instructions (Addendum)
Please set up your follow up with Dr Oneida Alar. Please change back to 81mg  aspirin daily. You can try to skip the omeprazole 1-2 days a week and see if that still controls your symptoms.

## 2016-01-16 NOTE — Progress Notes (Signed)
Subjective:    Patient ID: Randall Reeves, male    DOB: 1945-04-06, 70 y.o.   MRN: [id number]  HPI Here for Medicare wellness and follow up of chronic health conditions Reviewed form and advanced directives Reviewed other doctors No alcohol or tobacco Has been able to work out some now--- not as much as he would like yet Vision okay Mild hearing loss--especially per wife. Not ready for aides Had the fall with mild injury No depression or anhedonia Independent with instrumental ADLs  He stopped the losartan in the past few days Was feeling bad and BP was low on his machine Feels better off it Was having some joint pains---this [place] be better off the losartan also  Due to see Dr Oneida Alar soon Now checking aneurysms every other year  Memory issues have not changed No change in function, etc  Continues on the omeprazole daily If he skips several days in a row--he gets symptoms No dysphagia  Current Outpatient Prescriptions on File Prior to Visit  Medication Sig Dispense Refill  . aspirin EC 325 MG tablet Take 1 tablet (325 mg total) by mouth daily. 30 tablet 0  . hydrocortisone 2.5 % cream Apply topically 3 (three) times daily as needed. 56 g 3  . Lutein-Zeaxanthin 20-0.8 MG CAPS Take 1 capsule by mouth daily.    . Multiple Vitamin (MULTIVITAMIN) tablet Take 1 tablet by mouth daily.     Marland Kitchen omeprazole (PRILOSEC) 20 MG capsule Take 20 mg by mouth daily.    . tadalafil (CIALIS) 5 MG tablet Take 1 tablet (5 mg total) by mouth daily. 90 tablet 3   No current facility-administered medications on file prior to visit.     Allergies  Allergen Reactions  . Bisoprolol-Hydrochlorothiazide     REACTION: muscle pain, cramps  . Codeine Nausea Only    Past Medical History:  Diagnosis Date  . Allergy   . CKD (chronic kidney disease), stage III   . Coronary atherosclerosis of unspecified type of vessel, native or graft 2011   MI but cath benign. Dr Otis Brace  . GERD (gastroesophageal reflux  disease)   . Hyperlipidemia   . Hypertension   . Obstructive sleep apnea     Past Surgical History:  Procedure Laterality Date  . CARDIAC CATHETERIZATION    . CARPAL TUNNEL RELEASE    . spermatocele repair      Family History  Problem Relation Age of Onset  . Stroke Mother   . Hypertension Mother   . Diabetes Father   . Asthma Father   . Heart failure Father   . Heart disease Father   . Hypertension Sister   . Diabetes Sister   . Neuropathy Brother   . Hypertension Brother   . Cancer Brother   . Diabetes Brother   . Heart disease Brother   . Hypertension Brother   . Hypertension Brother   . Hypertension Brother   . Cancer Brother     prostate    Social History   Social History  . Marital status: Married    Spouse name: N/A  . Number of children: 2  . Years of education: N/A   Occupational History  . landscaping    Social History Main Topics  . Smoking status: Never Smoker  . Smokeless tobacco: Never Used  . Alcohol use 1.8 - 3.0 oz/week    3 - 5 Cans of beer per week     Comment: a beer a day  . Drug  use: No  . Sexual activity: Not on file   Other Topics Concern  . Not on file   Social History Narrative   Has living will   Wife is his health care POA.   Would accept resuscitation attempts.   Would leave feeding tube decision to his wife   Review of Systems Trying to be careful with eating--has lost a few pounds Appetite is good Still works for [employer] business Has sleep apnea--moderate. Tried CPAP but didn't tolerate. No sig daytime somnolence Wears seat belt No chest pain or SOB No palpitations Bowels are okay---no blood. Voids fine. Stream is okay No rash or suspicious lesions. Recent derm evaluation--- past [facility]    Objective:   Physical Exam  Constitutional: He is oriented to person, place, and time. He appears well-developed and well-nourished. No distress.  HENT:  Mouth/Throat: Oropharynx is clear and moist. No  oropharyngeal exudate.  Neck: Normal range of motion. Neck supple. No thyromegaly present.  Cardiovascular: Normal rate, regular rhythm, normal heart sounds and intact distal pulses.  Exam reveals no gallop.   No murmur heard. Pulmonary/Chest: Effort normal and breath sounds normal. No respiratory distress. He has no wheezes. He has no rales.  Abdominal: Soft. There is no tenderness.  Musculoskeletal: He exhibits no edema or tenderness.  Lymphadenopathy:    He has no cervical adenopathy.  Neurological: He is alert and oriented to person, place, and time.  President-- "Daisy Floro, Obama, Bush" [phone] [id number] Recall 2/3  Skin: No rash noted. No erythema.  Psychiatric: He has a normal mood and affect. His behavior is normal.          Assessment & Plan:

## 2016-01-16 NOTE — Assessment & Plan Note (Signed)
Due for follow up

## 2016-01-16 NOTE — Progress Notes (Signed)
Pre visit review using our clinic review tool, if applicable. No additional management support is needed unless otherwise documented below in the visit note. 

## 2016-01-16 NOTE — Assessment & Plan Note (Signed)
Seems vasovagal--will hold off on further evaluation unless more symptoms

## 2016-01-16 NOTE — Assessment & Plan Note (Signed)
BP Readings from Last 3 Encounters:  01/16/16 134/86  01/02/16 122/78  12/29/15 120/82   Feels better off the losartan--this [place] have been related to the syncope Will see how he does off the med

## 2016-01-16 NOTE — Assessment & Plan Note (Signed)
I have personally reviewed the Medicare Annual Wellness questionnaire and have noted 1. The patient's medical and social history 2. Their use of alcohol, tobacco or illicit drugs 3. Their current medications and supplements 4. The patient's functional ability including ADL's, fall risks, home safety risks and hearing or visual             impairment. 5. Diet and physical activities 6. Evidence for depression or mood disorders  The patients weight, height, BMI and visual acuity have been recorded in the chart I have made referrals, counseling and provided education to the patient based review of the above and I have provided the pt with a written personalized care plan for preventive services.  I have provided you with a copy of your personalized plan for preventive services. Please take the time to review along with your updated medication list.  UTD on imms Defer PSA till next year Colon due 4/19 Working on fitness

## 2016-01-16 NOTE — Assessment & Plan Note (Signed)
See social history 

## 2016-03-17 ENCOUNTER — Other Ambulatory Visit: Payer: Self-pay

## 2016-03-17 DIAGNOSIS — I728 Aneurysm of other specified arteries: Secondary | ICD-10-CM

## 2016-03-17 DIAGNOSIS — I701 Atherosclerosis of renal artery: Secondary | ICD-10-CM

## 2016-03-25 ENCOUNTER — Encounter: Payer: Self-pay | Admitting: Vascular Surgery

## 2016-04-01 ENCOUNTER — Ambulatory Visit
Admission: RE | Admit: 2016-04-01 | Discharge: 2016-04-01 | Disposition: A | Discharge status: Home / Self Care | Payer: PPO | Source: Ambulatory Visit | Attending: Vascular Surgery | Admitting: Vascular Surgery

## 2016-04-01 ENCOUNTER — Ambulatory Visit (INDEPENDENT_AMBULATORY_CARE_PROVIDER_SITE_OTHER): Payer: PPO | Admitting: Physician Assistant

## 2016-04-01 VITALS — BP 148/93 | HR 77 | Temp 97.7°F | Resp 18 | Ht 68.5 in | Wt 189.9 lb

## 2016-04-01 DIAGNOSIS — I728 Aneurysm of other specified arteries: Secondary | ICD-10-CM | POA: Diagnosis not present

## 2016-04-01 DIAGNOSIS — I701 Atherosclerosis of renal artery: Secondary | ICD-10-CM

## 2016-04-01 MED ORDER — IOPAMIDOL (ISOVUE-370) INJECTION 76%
75.0000 mL | Freq: Once | INTRAVENOUS | Status: AC | PRN
  Administered 2016-04-01: 75 mL via INTRAVENOUS

## 2016-04-01 NOTE — Progress Notes (Signed)
HISTORY AND PHYSICAL     CC:  To check splenic artery aneurysm Referring Provider:  Venia Carbon, MD  HPI: This is a 71 y.o. male that was seen in the past for evaluation of an incidental finding of splenic artery aneurysm for on CT scan in October 2014 after a trauma.  He had a CTA in January 2016, which revealed an 32mm splenic artery aneurysm and 30mm peripheral renal artery aneurysm.    He has not had any changes in his medical hx since he was last seen with the exception of passing out in December 2017 and hitting his head.  This required a 2 day hospitalization.  His medications were adjusted and he has not had any issues since then.  Otherwise, his workup was negative.   He takes a daily aspirin.  Past Medical History:  Diagnosis Date  . Allergy   . CKD (chronic kidney disease), stage III   . Coronary atherosclerosis of unspecified type of vessel, native or graft 2011   MI but cath benign. Dr Otis Brace  . GERD (gastroesophageal reflux disease)   . Hyperlipidemia   . Hypertension   . Obstructive sleep apnea     Past Surgical History:  Procedure Laterality Date  . CARDIAC CATHETERIZATION    . CARPAL TUNNEL RELEASE    . spermatocele repair      Allergies  Allergen Reactions  . Bisoprolol-Hydrochlorothiazide     REACTION: muscle pain, cramps  . Codeine Nausea Only    Current Outpatient Prescriptions  Medication Sig Dispense Refill  . aspirin EC 81 MG tablet Take 81 mg by mouth daily.    . hydrocortisone 2.5 % cream Apply topically 3 (three) times daily as needed. 56 g 3  . Lutein-Zeaxanthin 20-0.8 MG CAPS Take 1 capsule by mouth daily.    . Multiple Vitamin (MULTIVITAMIN) tablet Take 1 tablet by mouth daily.     Marland Kitchen omeprazole (PRILOSEC) 20 MG capsule Take 20 mg by mouth daily.    . tadalafil (CIALIS) 5 MG tablet Take 1 tablet (5 mg total) by mouth daily. 90 tablet 3   No current facility-administered medications for this visit.     Family History  Problem  Relation Age of Onset  . Stroke Mother   . Hypertension Mother   . Diabetes Father   . Asthma Father   . Heart failure Father   . Heart disease Father   . Hypertension Sister   . Diabetes Sister   . Neuropathy Brother   . Hypertension Brother   . Cancer Brother   . Diabetes Brother   . Heart disease Brother   . Hypertension Brother   . Hypertension Brother   . Hypertension Brother   . Cancer Brother     prostate    Social History   Social History  . Marital status: Married    Spouse name: N/A  . Number of children: 2  . Years of education: N/A   Occupational History  . landscaping    Social History Main Topics  . Smoking status: Never Smoker  . Smokeless tobacco: Never Used  . Alcohol use 1.8 - 3.0 oz/week    3 - 5 Cans of beer per week     Comment: a beer a day  . Drug use: No  . Sexual activity: Not on file   Other Topics Concern  . Not on file   Social History Narrative   Has living will   Wife is his health care  POA.   Would accept resuscitation attempts.   Would leave feeding tube decision to his wife     REVIEW OF SYSTEMS:   [X]  denotes positive finding, [ ]  denotes negative finding Cardiac  Comments:  Chest pain or chest pressure:    Shortness of breath upon exertion:    Short of breath when lying flat:    Irregular heart rhythm:        Vascular    Pain in calf, thigh, or hip brought on by ambulation:    Pain in feet at night that wakes you up from your sleep:     Blood clot in your veins:    Leg swelling:         Pulmonary    Oxygen at home:    Productive cough:     Wheezing:         Neurologic    Sudden weakness in arms or legs:     Sudden numbness in arms or legs:     Sudden onset of difficulty speaking or slurred speech:    Temporary loss of vision in one eye:     Problems with dizziness:         Gastrointestinal    Blood in stool:     Vomited blood:         Genitourinary    Burning when urinating:     Blood in urine:          Psychiatric    Major depression:         Hematologic    Bleeding problems:    Problems with blood clotting too easily:        Skin    Rashes or ulcers:        Constitutional    Fever or chills:      PHYSICAL EXAMINATION:  Vitals:   04/01/16 1245  BP: (!) 148/93  Pulse: 77  Resp: 18  Temp: 97.7 F (36.5 C)   Vitals:   04/01/16 1245  Weight: 189 lb 14.4 oz (86.1 kg)  Height: 5' 8.5" (1.74 m)    Body mass index is 28.45 kg/m.  General:  WDWN in NAD; vital signs documented above Gait: Not observed HENT: WNL, normocephalic Pulmonary: normal non-labored breathing , without Rales, rhonchi,  wheezing Cardiac: regular HR, without  Murmurs, rubs or gallops; without carotid bruits Abdomen: soft, NT, no masses Skin: without rashes Vascular Exam/Pulses:  Right Left  Radial 2+ (normal) 2+ (normal)  DP Unable to palpate  .Unable to palpate   PT 2+ (normal) 2+ (normal)   Extremities: without ischemic changes, without Gangrene , without cellulitis; without open wounds;  Musculoskeletal: no muscle wasting or atrophy  Neurologic: A&O X 3;  No focal weakness or paresthesias are detected Psychiatric:  The pt has Normal affect.   Non-Invasive Vascular Imaging:   CTA January 2016: IMPRESSION: 11 mm splenic artery aneurysm. 10 mm peripheral right renal artery aneurysm.  CTA 04/01/16: 18mm splenic artery aneurysm 46mm renal artery aneurysm   Pt meds includes: Statin:  Yes.   Beta Blocker:  No. Aspirin:  No. ACEI:  No. ARB:  No. CCB use:  No Other Antiplatelet/Anticoagulant:  No   ASSESSMENT/PLAN:: 71 y.o. male with splenic artery aneurysm   -Pt had CTA today and it was reviewed by Dr. Oneida Alar.  It is essentially unchanged from previous scan 2 years ago.  Would not consider repair of this unless it reaches greater than 2cm -we will have him f/u in 5  years with repeat CTA.   -he knows to present to the ER if he has severe onset of abdominal pain   Leontine Locket, PA-C [facility] [phone]  Clinic MD:  Oneida Alar

## 2016-04-15 ENCOUNTER — Other Ambulatory Visit: Payer: Self-pay | Admitting: Internal Medicine

## 2016-05-07 ENCOUNTER — Other Ambulatory Visit: Payer: Self-pay | Admitting: Internal Medicine

## 2016-11-02 DIAGNOSIS — M1711 Unilateral primary osteoarthritis, right knee: Secondary | ICD-10-CM | POA: Diagnosis not present

## 2016-11-02 DIAGNOSIS — M179 Osteoarthritis of knee, unspecified: Secondary | ICD-10-CM | POA: Insufficient documentation

## 2016-12-28 DIAGNOSIS — Z85828 Personal history of other malignant neoplasm of skin: Secondary | ICD-10-CM | POA: Diagnosis not present

## 2016-12-28 DIAGNOSIS — D225 Melanocytic nevi of trunk: Secondary | ICD-10-CM | POA: Diagnosis not present

## 2016-12-28 DIAGNOSIS — X32XXXA Exposure to sunlight, initial encounter: Secondary | ICD-10-CM | POA: Diagnosis not present

## 2016-12-28 DIAGNOSIS — D2261 Melanocytic nevi of right upper limb, including shoulder: Secondary | ICD-10-CM | POA: Diagnosis not present

## 2016-12-28 DIAGNOSIS — D2272 Melanocytic nevi of left lower limb, including hip: Secondary | ICD-10-CM | POA: Diagnosis not present

## 2016-12-28 DIAGNOSIS — L570 Actinic keratosis: Secondary | ICD-10-CM | POA: Diagnosis not present

## 2017-01-17 ENCOUNTER — Telehealth: Payer: Self-pay

## 2017-01-17 MED ORDER — TRIAMCINOLONE ACETONIDE 0.1 % EX CREA: 1.0000 "application " | TOPICAL_CREAM | Freq: Two times a day (BID) | CUTANEOUS | 1 refills | Status: DC | PRN

## 2017-01-17 NOTE — Telephone Encounter (Signed)
Spoke to pt

## 2017-01-17 NOTE — Telephone Encounter (Signed)
Patient would like refill on the hydrocortisone cream as he has itchy bumps on back and this has helped.    Will pend for Dr. Silvio Pate to approve.  Thanks.

## 2017-01-17 NOTE — Telephone Encounter (Signed)
Patient Name: Randall Reeves Gender: Male DOB: 28-Feb-1945 Age: 71 Y 3 M 6 D Return Phone Number: [phone] (Primary) Address: City/State/Zip: [place]  [address] Client [facility] Night - Client Client Site [place] Physician Viviana Simpler - MD Contact Type Call Who Is Calling Patient / Member / Family / Caregiver Call Type Triage / Clinical Caller Name linda Relationship To Patient Spouse Return Phone Number [phone] (Primary) Chief Complaint Rash - Localized Reason for Call Symptomatic / Request for [place] states her husband needs a refill for a cream . He has a rash on his back that is itching. Translation No Nurse Assessment Nurse: Laurena Bering, RN, Helene Kelp Date/Time Eilene Ghazi Time): 01/15/2017 11:30:04 AM Confirm and document reason for call. If symptomatic, describe symptoms. ---Caller states that her husband has tiny red raised bumps on his back. Has used Triamcinolone Does the patient have any new or worsening symptoms? ---Yes Will a triage be completed? ---Yes Related visit to physician within the last 2 weeks? ---No Does the PT have any chronic conditions? (i.e. diabetes, asthma, etc.) ---No Is this a behavioral health or substance abuse call? ---No Guidelines Guideline Title Affirmed Question Affirmed Notes Nurse Date/Time (Eastern Time) Rash or Redness - Localized Mild localized rash Milton Ferguson 01/15/2017 11:33:31 AM Disp. Time Eilene Ghazi Time) Disposition Final User 01/15/2017 11:35:11 AM Home Care Yes Laurena Bering, RN, [place] Artist Disagree/Comply Comply Caller Understands Yes PreDisposition Call Pharmacist Care Advice Given Per Guideline PLEASE NOTE: All timestamps contained within this report are represented as [place] Standard Time. CONFIDENTIALTY NOTICE: This fax transmission is intended only for the addressee. It contains information that is legally privileged,  confidential or otherwise protected from use or disclosure. If you are not the intended recipient, you are strictly prohibited from reviewing, disclosing, copying using or disseminating any of this information or taking any action in reliance on or regarding this information. If you have received this fax in error, please notify us immediately by telephone so that we can arrange for its return to [place]. Phone: [phone], Toll-Free: [phone], Fax: [phone] Page: 2 of 2 Call Id: [id number] Care Advice Given Per Guideline HOME CARE: You should be able to treat this at home. REASSURANCE AND EDUCATION: New rashes that are in one small (localized) area are usually due to skin contact with an irritating substance. AVOID THE CAUSE: * Try to find the cause. [place] THE AREA: Wash the area once thoroughly with soap and water to remove any remaining irritants. Thereafter avoid soaps in this area. Cleanse the area if needed with warm water. LOCAL COLD: Apply ice or soak in cold water for 20 minutes every 3 or 4 hours to reduce itching or pain. HYDROCORTISONE CREAM FOR ITCHING: * You can use hydrocortisone for very itchy spots. * Put 1% hydrocortisone cream on the itchy area(s) 3 times a day. Use it for a couple days, until it feels better. This will help decrease the itching. CALL BACK IF: * Rash spreads or becomes worse * Rash lasts over 1 week * You become worse. CARE ADVICE given per Rash - Localized and Cause Unknown (Adult) guideline.

## 2017-01-17 NOTE — Telephone Encounter (Signed)
Let them know I sent the prescription for him

## 2017-02-16 ENCOUNTER — Encounter: Payer: Self-pay | Admitting: Gastroenterology

## 2017-02-16 ENCOUNTER — Ambulatory Visit (INDEPENDENT_AMBULATORY_CARE_PROVIDER_SITE_OTHER): Payer: PPO | Admitting: Internal Medicine

## 2017-02-16 ENCOUNTER — Encounter: Payer: Self-pay | Admitting: Internal Medicine

## 2017-02-16 VITALS — BP 118/84 | HR 76 | Temp 98.2°F | Ht 68.5 in | Wt 189.0 lb

## 2017-02-16 DIAGNOSIS — I722 Aneurysm of renal artery: Secondary | ICD-10-CM | POA: Diagnosis not present

## 2017-02-16 DIAGNOSIS — I10 Essential (primary) hypertension: Secondary | ICD-10-CM | POA: Diagnosis not present

## 2017-02-16 DIAGNOSIS — N183 Chronic kidney disease, stage 3 unspecified: Secondary | ICD-10-CM

## 2017-02-16 DIAGNOSIS — I2583 Coronary atherosclerosis due to lipid rich plaque: Secondary | ICD-10-CM

## 2017-02-16 DIAGNOSIS — Z1211 Encounter for screening for malignant neoplasm of colon: Secondary | ICD-10-CM

## 2017-02-16 DIAGNOSIS — I728 Aneurysm of other specified arteries: Secondary | ICD-10-CM | POA: Diagnosis not present

## 2017-02-16 DIAGNOSIS — N138 Other obstructive and reflux uropathy: Secondary | ICD-10-CM

## 2017-02-16 DIAGNOSIS — K219 Gastro-esophageal reflux disease without esophagitis: Secondary | ICD-10-CM | POA: Diagnosis not present

## 2017-02-16 DIAGNOSIS — N401 Enlarged prostate with lower urinary tract symptoms: Secondary | ICD-10-CM | POA: Diagnosis not present

## 2017-02-16 DIAGNOSIS — I2510 Atherosclerotic heart disease of native coronary artery without angina pectoris: Secondary | ICD-10-CM | POA: Diagnosis not present

## 2017-02-16 DIAGNOSIS — G4733 Obstructive sleep apnea (adult) (pediatric): Secondary | ICD-10-CM

## 2017-02-16 DIAGNOSIS — Z7189 Other specified counseling: Secondary | ICD-10-CM | POA: Diagnosis not present

## 2017-02-16 DIAGNOSIS — Z0000 Encounter for general adult medical examination without abnormal findings: Secondary | ICD-10-CM

## 2017-02-16 LAB — CBC
HEMATOCRIT: 44.5 % (ref 39.0–52.0)
Hemoglobin: 14.9 g/dL (ref 13.0–17.0)
MCHC: 33.4 g/dL (ref 30.0–36.0)
MCV: 95.2 fl (ref 78.0–100.0)
Platelets: 229 10*3/uL (ref 150.0–400.0)
RBC: 4.67 Mil/uL (ref 4.22–5.81)
RDW: 13.1 % (ref 11.5–15.5)
WBC: 5 10*3/uL (ref 4.0–10.5)

## 2017-02-16 LAB — COMPREHENSIVE METABOLIC PANEL
ALBUMIN: 4.4 g/dL (ref 3.5–5.2)
ALT: 24 U/L (ref 0–53)
AST: 21 U/L (ref 0–37)
Alkaline Phosphatase: 62 U/L (ref 39–117)
BUN: 28 mg/dL — ABNORMAL HIGH (ref 6–23)
CALCIUM: 9.4 mg/dL (ref 8.4–10.5)
CHLORIDE: 102 meq/L (ref 96–112)
CO2: 26 meq/L (ref 19–32)
Creatinine, Ser: 1.28 mg/dL (ref 0.40–1.50)
GFR: 59.16 mL/min — AB (ref >60.00)
Glucose, Bld: 95 mg/dL (ref 70–99)
POTASSIUM: 3.9 meq/L (ref 3.5–5.1)
Sodium: 138 mEq/L (ref 135–145)
Total Bilirubin: 0.6 mg/dL (ref 0.2–1.2)
Total Protein: 7.8 g/dL (ref 6.0–8.3)

## 2017-02-16 LAB — LIPID PANEL
Cholesterol: 201 mg/dL — ABNORMAL HIGH (ref 0–200)
HDL: 51.6 mg/dL (ref >39.00)
LDL CALC: 125 mg/dL — AB (ref 0–99)
NonHDL: 149.01
TRIGLYCERIDES: 122 mg/dL (ref 0.0–149.0)
Total CHOL/HDL Ratio: 4
VLDL: 24.4 mg/dL (ref 0.0–40.0)

## 2017-02-16 MED ORDER — OMEPRAZOLE 20 MG PO CPDR: 20.0000 mg | DELAYED_RELEASE_CAPSULE | Freq: Two times a day (BID) | ORAL | 3 refills | Status: DC

## 2017-02-16 MED ORDER — ATORVASTATIN CALCIUM 10 MG PO TABS: 10.0000 mg | ORAL_TABLET | Freq: Every day | ORAL | 3 refills | Status: DC

## 2017-02-16 NOTE — Progress Notes (Signed)
Hearing Screening   125Hz  250Hz  500Hz  1000Hz  2000Hz  3000Hz  4000Hz  6000Hz  8000Hz   Right ear:   20 20 20  20     Left ear:   20 20 20   0      Visual Acuity Screening   Right eye Left eye Both eyes  Without correction:     With correction: 20/20 20/20 20/20

## 2017-02-16 NOTE — Assessment & Plan Note (Signed)
See social history 

## 2017-02-16 NOTE — Assessment & Plan Note (Signed)
Intermittent follow up Will try low dose statin

## 2017-02-16 NOTE — Assessment & Plan Note (Signed)
I have personally reviewed the Medicare Annual Wellness questionnaire and have noted 1. The patient's medical and social history 2. Their use of alcohol, tobacco or illicit drugs 3. Their current medications and supplements 4. The patient's functional ability including ADL's, fall risks, home safety risks and hearing or visual             impairment. 5. Diet and physical activities 6. Evidence for depression or mood disorders  The patients weight, height, BMI and visual acuity have been recorded in the chart I have made referrals, counseling and provided education to the patient based review of the above and I have provided the pt with a written personalized care plan for preventive services.  I have provided you with a copy of your personalized plan for preventive services. Please take the time to review along with your updated medication list.  Had Td at pharmacy--will confirm Will set up colon ---especially with vague LLQ symptoms Will check PSA--probably the last

## 2017-02-16 NOTE — Assessment & Plan Note (Signed)
On ARB Will recheck 

## 2017-02-16 NOTE — Assessment & Plan Note (Signed)
Intermittent follow up about this

## 2017-02-16 NOTE — Assessment & Plan Note (Signed)
Symptomatic relief with the cialis Will check PSA

## 2017-02-16 NOTE — Assessment & Plan Note (Signed)
BP Readings from Last 3 Encounters:  02/16/17 118/84  04/01/16 (!) 148/93  01/16/16 134/86   Generally good Not sure what the high AM means No change for now

## 2017-02-16 NOTE — Progress Notes (Signed)
Subjective:    Patient ID: Randall Reeves, male    DOB: 11-27-1945, 72 y.o.   MRN: [id number]  HPI Here for Medicare wellness visit and follow up of chronic health conditions Reviewed form and advanced directives Reviewed other doctors No tobacco or alcohol Does some exercise Vision and hearing are okay No falls No depression or anhedonia Independent with instrumental ADLs No sig memory issues  Having increased problems with sleep apnea Feels his BP is going up--he checks it in the morning 150/95 is highest despite being on the BP med Seems to go back down after any physical activity--130/78 Has to sleep in recliner and wife has noticed apnea Didn't tolerate full facemask Does give out during day at times---but better after a pepsi  Has a left side feeling (LLQ)---usually more pronounced after dinner Bowels are regular No blood Lasts 1 hour or so then just remits No apparent hernia---no symptoms with activity  Ongoing acid reflux Still has to sleep upright in recliner to reduce symptoms No dysphagia Takes medication 7PM----- discussed it should be fasting  Had check of splenic artery aneurysm Dr Oneida Alar has cut back on follow up to every 5 years  Voids okay Nocturia in early morning Less urgency with less caffeine cialis helps this  No chest pain or SOB No dizziness or syncope No palpitations No edema  Current Outpatient Medications on File Prior to Visit  Medication Sig Dispense Refill  . aspirin EC 81 MG tablet Take 81 mg by mouth daily.    . hydrocortisone 2.5 % cream Apply topically 3 (three) times daily as needed. 56 g 3  . losartan (COZAAR) 50 MG tablet take 1 tablet by mouth once daily 90 tablet 2  . Melatonin 5 MG TABS Take 1 tablet by mouth at bedtime.    . Multiple Vitamin (MULTIVITAMIN) tablet Take 1 tablet by mouth daily.     Marland Kitchen omeprazole (PRILOSEC) 20 MG capsule Take 20 mg by mouth 2 (two) times daily.    . tadalafil (CIALIS) 5 MG tablet Take 1 tablet  (5 mg total) by mouth daily. 90 tablet 3  . triamcinolone cream (KENALOG) 0.1 % Apply 1 application topically 2 (two) times daily as needed. 45 g 1   No current facility-administered medications on file prior to visit.     Allergies  Allergen Reactions  . Bisoprolol-Hydrochlorothiazide     REACTION: muscle pain, cramps  . Codeine Nausea Only    Past Medical History:  Diagnosis Date  . Allergy   . CKD (chronic kidney disease), stage III ([place])   . Coronary atherosclerosis of unspecified type of vessel, native or graft 2011   MI but cath benign. Dr Otis Brace  . GERD (gastroesophageal reflux disease)   . Hyperlipidemia   . Hypertension   . Obstructive sleep apnea     Past Surgical History:  Procedure Laterality Date  . CARDIAC CATHETERIZATION    . CARPAL TUNNEL RELEASE    . spermatocele repair      Family History  Problem Relation Age of Onset  . Stroke Mother   . Hypertension Mother   . Diabetes Father   . Asthma Father   . Heart failure Father   . Heart disease Father   . Hypertension Sister   . Diabetes Sister   . Neuropathy Brother   . Hypertension Brother   . Cancer Brother   . Diabetes Brother   . Heart disease Brother   . Hypertension Brother   . Hypertension  Brother   . Hypertension Brother   . Cancer Brother        prostate    Social History   Socioeconomic History  . Marital status: Married    Spouse name: Not on file  . Number of children: 2  . Years of education: Not on file  . Highest education level: Not on file  Social Needs  . Financial resource strain: Not on file  . Food insecurity - worry: Not on file  . Food insecurity - inability: Not on file  . Transportation needs - medical: Not on file  . Transportation needs - non-medical: Not on file  Occupational History  . Occupation: Biomedical scientist  Tobacco Use  . Smoking status: Never Smoker  . Smokeless tobacco: Never Used  Substance and Sexual Activity  . Alcohol use: Yes     Alcohol/week: 1.8 - 3.0 oz    Types: 3 - 5 Cans of beer per week    Comment: a beer a day  . Drug use: No  . Sexual activity: Not on file  Other Topics Concern  . Not on file  Social History Narrative   Has living will   Wife is his health care POA--alternate is son   Would accept resuscitation attempts.   Would leave feeding tube decision to his wife   Review of Systems Occasional headache Appetite is fine Weight is stable Wears seat belt Teeth okay-- keeps up with dentist Regular skin checks by Dr Evorn Gong Bowels are regular--no blood Some aches and pains--nothing striking. Does notice stiffness in hands    Objective:   Physical Exam  Constitutional: He is oriented to person, place, and time. He appears well-developed. No distress.  HENT:  Mouth/Throat: Oropharynx is clear and moist. No oropharyngeal exudate.  Neck: No thyromegaly present.  Cardiovascular: Normal rate, regular rhythm, normal heart sounds and intact distal pulses. Exam reveals no gallop.  No murmur heard. Pulmonary/Chest: Effort normal and breath sounds normal. No respiratory distress. He has no wheezes. He has no rales.  Abdominal: Soft. There is no tenderness.  Musculoskeletal: He exhibits no edema or tenderness.  Lymphadenopathy:    He has no cervical adenopathy.  Neurological: He is alert and oriented to person, place, and time.  President--- "Daisy Floro, Barack Abbe [place] Bush" [id number] Recall 3/3  Skin: No rash noted. No erythema.  Psychiatric: He has a normal mood and affect. His behavior is normal.          Assessment & Plan:

## 2017-02-16 NOTE — Assessment & Plan Note (Signed)
Ongoing symptoms Will increase to bid Sia need EGD with upcoming colon

## 2017-02-16 NOTE — Assessment & Plan Note (Signed)
Increased trouble Will set up sleep evaluation

## 2017-02-16 NOTE — Assessment & Plan Note (Signed)
Quiet Intolerant of statin in past (?pravastatin) Will try low dose new one

## 2017-02-17 ENCOUNTER — Other Ambulatory Visit: Payer: Self-pay | Admitting: Internal Medicine

## 2017-02-17 DIAGNOSIS — N138 Other obstructive and reflux uropathy: Secondary | ICD-10-CM

## 2017-02-17 DIAGNOSIS — N401 Enlarged prostate with lower urinary tract symptoms: Principal | ICD-10-CM

## 2017-02-18 ENCOUNTER — Other Ambulatory Visit: Payer: Self-pay | Admitting: Internal Medicine

## 2017-02-24 ENCOUNTER — Ambulatory Visit: Payer: Self-pay

## 2017-02-24 NOTE — Telephone Encounter (Signed)
Pt calling c/o diarrhea x 2 weeks Pt has tried Pepto Bismol, Immodium AD. Pt states having 1-2 loose to watery stools per day. No vomiting. C/o stomach "churning" but states there is no pain. Pt has been drinking ginger ale, chicken soup and jello. Care advice given. Pt has appt 3:15 pm tomorrow.   Reason for Disposition . [1] MILD diarrhea (e.g., 1-3 or more stools than normal in past 24 hours) without known cause AND [2] present >  7 days  Answer Assessment - Initial Assessment Questions 1. DIARRHEA SEVERITY: "How bad is the diarrhea?" "How many extra stools have you had in the past 24 hours than normal?"    - MILD: Few loose or mushy BMs; increase of 1-3 stools over normal daily number of stools; mild increase in ostomy output.   - MODERATE: Increase of 4-6 stools daily over normal; moderate increase in ostomy output.   - SEVERE (or Worst Possible): Increase of 7 or more stools daily over normal; moderate increase in ostomy output; incontinence.     mild 2. ONSET: "When did the diarrhea begin?"      2 weeks ago 3. BM CONSISTENCY: "How loose or watery is the diarrhea?"      "Semi loose" 4. VOMITING: "Are you also vomiting?" If so, ask: "How many times in the past 24 hours?"      no 5. ABDOMINAL PAIN: "Are you having any abdominal pain?" If yes: "What does it feel like?" (e.g., crampy, dull, intermittent, constant)      No pain But feel like stomach "churning" 6. ABDOMINAL PAIN SEVERITY: If present, ask: "How bad is the pain?"  (e.g., Scale 1-10; mild, moderate, or severe)    - MILD (1-3): doesn't interfere with normal activities, abdomen soft and not tender to touch     - MODERATE (4-7): interferes with normal activities or awakens from sleep, tender to touch     - SEVERE (8-10): excruciating pain, doubled over, unable to do any normal activities       mild 7. ORAL INTAKE: If vomiting, "Have you been able to drink liquids?" "How much fluids have you had in the past 24 hours?"     n/a 8.  HYDRATION: "Any signs of dehydration?" (e.g., dry mouth [not just dry lips], too weak to stand, dizziness, new weight loss) "When did you last urinate?"     Dry tongue today- last urinated 1 hour ago-dark yellow 9. EXPOSURE: "Have you traveled to a foreign country recently?" "Have you been exposed to anyone with diarrhea?" "Could you have eaten any food that was spoiled?"     No travel- no exposure-no spoiled food  10. OTHER SYMPTOMS: "Do you have any other symptoms?" (e.g., fever, blood in stool)       no 11. PREGNANCY: "Is there any chance you are pregnant?" "When was your last menstrual period?"       n/a  Protocols used: DIARRHEA-A-AH

## 2017-02-25 ENCOUNTER — Ambulatory Visit: Payer: PPO | Admitting: Family Medicine

## 2017-02-25 DIAGNOSIS — Z0289 Encounter for other administrative examinations: Secondary | ICD-10-CM

## 2017-03-21 ENCOUNTER — Ambulatory Visit: Payer: PPO | Admitting: Pulmonary Disease

## 2017-03-21 ENCOUNTER — Encounter: Payer: Self-pay | Admitting: Pulmonary Disease

## 2017-03-21 VITALS — BP 142/70 | HR 91 | Ht 69.0 in | Wt 195.0 lb

## 2017-03-21 DIAGNOSIS — K219 Gastro-esophageal reflux disease without esophagitis: Secondary | ICD-10-CM

## 2017-03-21 DIAGNOSIS — R0681 Apnea, not elsewhere classified: Secondary | ICD-10-CM | POA: Diagnosis not present

## 2017-03-21 DIAGNOSIS — G4733 Obstructive sleep apnea (adult) (pediatric): Secondary | ICD-10-CM

## 2017-03-21 NOTE — Patient Instructions (Signed)
Will arrange for home sleep study Will call to arrange for follow up after sleep study reviewed  

## 2017-03-21 NOTE — Progress Notes (Signed)
[facility]  Chief Complaint  Patient presents with  . sleep consult    Pt referred by Dr. Silvio Pate MD. Pt tried to [place] cpap machine for 2 months couldn't use it, that was 5 years ago. Pt snores loudly, pt sleeps in recliner for last 5 years, if reclined he sleeps good.    Vital signs: BP (!) 142/70 (BP Location: Left Arm, Cuff Size: Normal)   Pulse 91   Ht 5\' 9"  (1.753 m)   Wt 195 lb (88.5 kg)   SpO2 99%   BMI 28.80 kg/m   History of Present Illness: Randall Reeves is a 72 y.o. male for evaluation of sleep problems.  He was seen by Dr. Gwenette Greet in 2006.  He had sleep study then and found to have moderate sleep apnea.  He was tried on CPAP for a few months, and but wasn't able to tolerate this.  He thinks he was also seen by Dr. Ron Parker for oral appliance.  He doesn't remember what happened with this.    For the past several years he has been sleeping in a recliner.  He does this so he can breath at night, and this helps reduce his episodes of reflux.    His wife says he snores if he sleeps in bed and will stop breathing.  He is more restless if he sleeps in bed.  He goes to sleep at 9 pm.  He falls asleep immediately.  He wakes up some times to use the bathroom.  He gets out of bed at 530 am.  He feels okay in the morning.  He denies morning headache.  He does not use anything to help him stay awake.  He takes melatonin sometimes.  He denies sleep walking, sleep talking, bruxism, or nightmares.  There is no history of restless legs.  He denies sleep hallucinations, sleep paralysis, or cataplexy.  The Epworth score is 8 out of 24.    Physical Exam:  General - pleasant Eyes - pupils reactive, wears glasses ENT - no sinus tenderness, no oral exudate, no LAN, scalloped tongue, MP 4 Cardiac - regular, no murmur Chest - no wheeze, rales Abd - soft, non tender Ext - no edema Skin - no rashes Neuro - normal strength Psych - normal  mood  Discussion: He has snoring, sleep disruption, witnessed apnea, daytime sleepiness and nocturnal reflux.  He has history of hypertension and CAD.  He also has prior history of sleep apnea.  I am concerned that he still has sleep apnea.  We discussed how sleep apnea can affect various health problems, including risks for hypertension, cardiovascular disease, and diabetes.  We also discussed how sleep disruption can increase risks for accidents, such as while driving.  Weight loss as a means of improving sleep apnea was also reviewed.  Additional treatment options discussed were CPAP therapy, oral appliance, and surgical intervention.    Assessment/Plan:  Obstructive sleep apnea. - will arrange for home sleep study to assess current status - asked him to sleep in his bed on the night he does home sleep study  Acid reflux. - he is schedule for appointment with GI - discussed how untreated sleep apnea can contribute to reflux   Patient Instructions  Will arrange for home sleep study Will call to arrange for follow up after sleep study reviewed     Chesley Mires, MD [place] 03/21/2017, 10:22 AM Pager:  [phone]  Flow Sheet  Sleep tests:  PSG 10/15/04 >> RDI 22, SpO2 low 85%  Cardiac tests: Echo 12/29/15 >> EF 55 to 60%, grade 2 DD  Review of Systems: Constitutional: Positive for appetite change and unexpected weight change. Negative for fever.  HENT: Positive for congestion, postnasal drip, sinus pressure, sneezing and sore throat. Negative for dental problem, ear pain, nosebleeds, rhinorrhea and trouble swallowing.   Eyes: Negative for redness and itching.  Respiratory: Negative for cough, chest tightness, shortness of breath and wheezing.   Cardiovascular: Negative for palpitations and leg swelling.  Gastrointestinal: Negative for nausea and vomiting.  Genitourinary: Negative for dysuria.  Musculoskeletal: Negative for joint swelling.  Skin:  Negative for rash.  Allergic/Immunologic: Negative.  Negative for environmental allergies, food allergies and immunocompromised state.  Neurological: Negative for headaches.  Hematological: Does not bruise/bleed easily.  Psychiatric/Behavioral: Negative for dysphoric mood. The patient is not nervous/anxious.    Past Medical History: He  has a past medical history of Allergy, CKD (chronic kidney disease), stage III ([place]), Coronary atherosclerosis of unspecified type of vessel, native or graft (2011), GERD (gastroesophageal reflux disease), Hyperlipidemia, Hypertension, and Obstructive sleep apnea.  Past Surgical History: He  has a past surgical history that includes spermatocele repair; Cardiac catheterization; and Carpal tunnel release.  Family History: His family history includes Asthma in his father; Cancer in his brother and brother; Diabetes in his brother, father, and sister; Heart disease in his brother and father; Heart failure in his father; Hypertension in his brother, brother, brother, brother, mother, and sister; Neuropathy in his brother; Stroke in his mother.  Social History: He  reports that  has never smoked. he has never used smokeless tobacco. He reports that he drinks about 1.8 - 3.0 oz of alcohol per week. He reports that he does not use drugs.  Medications: Allergies as of 03/21/2017      Reactions   Bisoprolol-hydrochlorothiazide    REACTION: muscle pain, cramps   Codeine Nausea Only      Medication List        Accurate as of 03/21/17 10:22 AM. Always use your most recent med list.          aspirin EC 81 MG tablet Take 81 mg by mouth daily.   atorvastatin 10 MG tablet Commonly known as:  LIPITOR Take 1 tablet (10 mg total) by mouth daily.   CIALIS 5 MG tablet Generic drug:  tadalafil take 1 tablet by mouth once daily   hydrocortisone 2.5 % cream Apply topically 3 (three) times daily as needed.   losartan 50 MG tablet Commonly known as:  COZAAR take  1 tablet by mouth once daily   Melatonin 5 MG Tabs Take 1 tablet by mouth at bedtime.   multivitamin tablet Take 1 tablet by mouth daily.   omeprazole 20 MG capsule Commonly known as:  PRILOSEC Take 1 capsule (20 mg total) by mouth 2 (two) times daily.   triamcinolone cream 0.1 % Commonly known as:  KENALOG Apply 1 application topically 2 (two) times daily as needed.

## 2017-03-21 NOTE — Progress Notes (Signed)
   Subjective:    Patient ID: Randall Reeves, male    DOB: 1945-01-28, 72 y.o.   MRN: [id number]  HPI    Review of Systems  Constitutional: Positive for appetite change and unexpected weight change. Negative for fever.  HENT: Positive for congestion, postnasal drip, sinus pressure, sneezing and sore throat. Negative for dental problem, ear pain, nosebleeds, rhinorrhea and trouble swallowing.   Eyes: Negative for redness and itching.  Respiratory: Negative for cough, chest tightness, shortness of breath and wheezing.   Cardiovascular: Negative for palpitations and leg swelling.  Gastrointestinal: Negative for nausea and vomiting.  Genitourinary: Negative for dysuria.  Musculoskeletal: Negative for joint swelling.  Skin: Negative for rash.  Allergic/Immunologic: Negative.  Negative for environmental allergies, food allergies and immunocompromised state.  Neurological: Negative for headaches.  Hematological: Does not bruise/bleed easily.  Psychiatric/Behavioral: Negative for dysphoric mood. The patient is not nervous/anxious.        Objective:   Physical Exam        Assessment & Plan:

## 2017-03-29 ENCOUNTER — Ambulatory Visit (INDEPENDENT_AMBULATORY_CARE_PROVIDER_SITE_OTHER): Payer: PPO | Admitting: Gastroenterology

## 2017-03-29 ENCOUNTER — Encounter: Payer: Self-pay | Admitting: Gastroenterology

## 2017-03-29 VITALS — BP 124/72 | HR 80 | Ht 68.11 in | Wt 195.1 lb

## 2017-03-29 DIAGNOSIS — Z1211 Encounter for screening for malignant neoplasm of colon: Secondary | ICD-10-CM | POA: Diagnosis not present

## 2017-03-29 DIAGNOSIS — K219 Gastro-esophageal reflux disease without esophagitis: Secondary | ICD-10-CM

## 2017-03-29 DIAGNOSIS — R1032 Left lower quadrant pain: Secondary | ICD-10-CM | POA: Diagnosis not present

## 2017-03-29 MED ORDER — PEG-KCL-NACL-NASULF-NA ASC-C 140 G PO SOLR: 140.0000 g | ORAL | 0 refills | Status: DC

## 2017-03-29 NOTE — Patient Instructions (Signed)
If you are age 70 or older, your body mass index should be between 23-30. Your Body mass index is 29.57 kg/m. If this is out of the aforementioned range listed, please consider follow up with your Primary Care Provider.  If you are age 54 or younger, your body mass index should be between 19-25. Your Body mass index is 29.57 kg/m. If this is out of the aformentioned range listed, please consider follow up with your Primary Care Provider.   You have been scheduled for an endoscopy and colonoscopy. Please follow the written instructions given to you at your visit today. Please pick up your prep supplies at the pharmacy within the next 1-3 days. If you use inhalers (even only as needed), please bring them with you on the day of your procedure. Your physician has requested that you go to www.startemmi.com and enter the access code given to you at your visit today. This web site gives a general overview about your procedure. However, you should still follow specific instructions given to you by our office regarding your preparation for the procedure.  Thank you for choosing [facility]  Dr Wilfrid Lund III

## 2017-03-29 NOTE — Progress Notes (Signed)
[facility] Consult Note:  History: Randall Reeves 03/29/2017  Referring physician: Venia Carbon, MD  Reason for consult/chief complaint: Abdominal Pain (LLQ pain, notices more at night) and Gastroesophageal Reflux (heartburn and regurgitation at night when lying flat)   Subjective  HPI:  This is a 72 year old man referred by primary care noted above for long-standing reflux symptoms and some recent left lower quadrant pain.  For the last 4-5 months he has noticed a dull left lower quadrant pain that is nonradiating and varies from faint to more pronounced, and he only seems to notice it in the evening.  In January he had a few weeks of diarrhea that he thinks [place] have been from an infectious trigger.  It gradually resolved and he is back to his regular "clockwork" BMs.  He denies rectal bleeding, his appetite is been good and his weight stable.   Saw [facility] 04/2007, not not available.  Colonoscopy normal.  Bx taken for "change bowels" Reportedly EGD 1999  Has also been bothered by decades of reflux which troubles him more so at night.  He will have heartburn and regurgitation, and he notices it more if he eats a larger or fatty meal.  He tries to be attentive to his GERD diet and lifestyle measures, and has been sleeping in a recliner for years.  At least some of that is also related to sleep apnea, and he wonders if those 2 conditions are related. Because of these reflux symptoms, Dr. Silvio Pate advised him to increase his PPI to twice daily, he noticed no change in symptoms after about a week, so he decreased to once daily.  He feels like that provides adequate control of symptoms.   ROS:  Review of Systems  Constitutional: Negative for appetite change and unexpected weight change.  HENT: Negative for mouth sores and voice change.   Eyes: Negative for pain and redness.  Respiratory: Negative for cough and shortness of breath.   Cardiovascular: Negative for chest  pain and palpitations.  Genitourinary: Negative for dysuria and hematuria.  Musculoskeletal: Negative for arthralgias and myalgias.  Skin: Negative for pallor and rash.  Neurological: Negative for weakness and headaches.  Hematological: Negative for adenopathy.     Past Medical History: Past Medical History:  Diagnosis Date  . Allergy   . CKD (chronic kidney disease), stage III ([place])   . Coronary atherosclerosis of unspecified type of vessel, native or graft 2011   MI but cath benign. Dr Otis Brace  . GERD (gastroesophageal reflux disease)   . Hyperlipidemia   . Hypertension   . Obstructive sleep apnea      Past Surgical History: Past Surgical History:  Procedure Laterality Date  . CARDIAC CATHETERIZATION    . CARPAL TUNNEL RELEASE Left 02/2013  . INGUINAL HERNIA REPAIR Left 1983   with vasectomy  . SPERMATOCELECTOMY  1998  . VASECTOMY  1983   with inguinal hernia repair     Family History: Family History  Problem Relation Age of Onset  . Stroke Mother   . Hypertension Mother   . Diabetes Father   . Asthma Father   . Heart failure Father   . Heart disease Father   . Hypertension Sister   . Diabetes Sister   . Neuropathy Brother   . Hypertension Brother   . Diabetes Brother   . Heart disease Brother   . Prostate cancer Brother   . Hypertension Brother   . Hypertension Brother   . Hypertension Brother  No CRC No esophageal or gastric cancer  Social History: Social History   Socioeconomic History  . Marital status: Married    Spouse name: None  . Number of children: 2  . Years of education: None  . Highest education level: None  Social Needs  . Financial resource strain: None  . Food insecurity - worry: None  . Food insecurity - inability: None  . Transportation needs - medical: None  . Transportation needs - non-medical: None  Occupational History  . Occupation: Biomedical scientist  Tobacco Use  . Smoking status: Never Smoker  . Smokeless tobacco:  Never Used  Substance and Sexual Activity  . Alcohol use: Yes    Alcohol/week: 1.8 - 3.0 oz    Types: 3 - 5 Cans of beer per week    Comment: a beer a day maybe  . Drug use: No  . Sexual activity: None  Other Topics Concern  . None  Social History Narrative   Has living will   Wife is his health care POA--alternate is son   Would accept resuscitation attempts.   Would leave feeding tube decision to his wife    Allergies: Allergies  Allergen Reactions  . Bisoprolol-Hydrochlorothiazide     REACTION: muscle pain, cramps  . Codeine Nausea Only    Outpatient Meds: Current Outpatient Medications  Medication Sig Dispense Refill  . aspirin EC 81 MG tablet Take 81 mg by mouth daily.    Marland Kitchen atorvastatin (LIPITOR) 10 MG tablet Take 1 tablet (10 mg total) by mouth daily. 90 tablet 3  . CIALIS 5 MG tablet take 1 tablet by mouth once daily 90 tablet 3  . hydrocortisone 2.5 % cream Apply topically 3 (three) times daily as needed. 56 g 3  . losartan (COZAAR) 50 MG tablet take 1 tablet by mouth once daily 90 tablet 2  . Melatonin 5 MG TABS Take 1 tablet by mouth at bedtime.    . Multiple Vitamin (MULTIVITAMIN) tablet Take 1 tablet by mouth daily.     Marland Kitchen omeprazole (PRILOSEC) 20 MG capsule Take 1 capsule (20 mg total) by mouth 2 (two) times daily. 180 capsule 3  . triamcinolone cream (KENALOG) 0.1 % Apply 1 application topically 2 (two) times daily as needed. 45 g 1  . PEG-KCl-NaCl-NaSulf-Na Asc-C (PLENVU) 140 g SOLR Take 140 g by mouth as directed. 1 each 0   No current facility-administered medications for this visit.       ___________________________________________________________________ Objective   Exam:  BP 124/72 (BP Location: Left Arm, Patient Position: Sitting, Cuff Size: Normal)   Pulse 80 Comment: irregular  Ht 5' 8.11" (1.73 m) Comment: height measured without shoes  Wt 195 lb 2 oz (88.5 kg)   BMI 29.57 kg/m    General: this is a(n) overweight and well-appearing man,  normal vocal quality, no muscle wasting  Eyes: sclera anicteric, no redness  ENT: oral mucosa moist without lesions, no cervical or supraclavicular lymphadenopathy, good dentition  CV: RRR without murmur, S1/S2, no JVD, no peripheral edema  Resp: clear to auscultation bilaterally, normal RR and effort noted  GI: soft, no tenderness, with active bowel sounds. No guarding or palpable organomegaly noted.  Skin; warm and dry, no rash or jaundice noted  Neuro: awake, alert and oriented x 3. Normal gross motor function and fluent speech  Labs:  No data for review  Assessment: Encounter Diagnoses  Name Primary?  . Gastroesophageal reflux disease, esophagitis presence not specified Yes  . LLQ abdominal pain   .  Special screening for malignant neoplasms, colon     His left lower quadrant pain is not sound likely to be pathological.  I think the recent period of diarrhea was most likely unrelated and is now resolved.  We had a long discussion regarding reflux and the limitation of acid suppression.  Diet and lifestyle measures were reviewed.  I advised him to have an upper endoscopy to evaluate for erosive esophagitis, Barrett's esophagus, hiatal hernia. He is also in need of a screening colonoscopy.  Plan:  EGD and colonoscopy.  He is agreeable after discussion of procedure and risks.  The benefits and risks of the planned procedure were described in detail with the patient or (when appropriate) their health care proxy.  Risks were outlined as including, but not limited to, bleeding, infection, perforation, adverse medication reaction leading to cardiac or pulmonary decompensation, or pancreatitis (if ERCP).  The limitation of incomplete mucosal visualization was also discussed.  No guarantees or warranties were given.   Thank you for the courtesy of this consult.  Please call me with any questions or concerns.  Nelida Meuse III  CC: Venia Carbon, MD

## 2017-03-30 ENCOUNTER — Telehealth: Payer: Self-pay | Admitting: Gastroenterology

## 2017-03-30 NOTE — Telephone Encounter (Signed)
Pt has been notified and aware to come to the office and pick up a free sample Plenvu prep.

## 2017-04-05 DIAGNOSIS — G4733 Obstructive sleep apnea (adult) (pediatric): Secondary | ICD-10-CM | POA: Diagnosis not present

## 2017-04-06 ENCOUNTER — Other Ambulatory Visit: Payer: Self-pay | Admitting: *Deleted

## 2017-04-06 ENCOUNTER — Encounter: Payer: Self-pay | Admitting: Gastroenterology

## 2017-04-06 ENCOUNTER — Telehealth: Payer: Self-pay | Admitting: Pulmonary Disease

## 2017-04-06 DIAGNOSIS — G4733 Obstructive sleep apnea (adult) (pediatric): Secondary | ICD-10-CM

## 2017-04-06 NOTE — Telephone Encounter (Signed)
HST 04/05/17 >> AHI 66.3, SaO2 low 81%   Will have my nurse inform pt that sleep study shows severe sleep apnea.  Options are 1) CPAP now, 2) ROV first.  If He is agreeable to CPAP, then please send order for auto CPAP range 5 to 15 cm H2O with heated humidity and mask of choice.  Have download sent 1 month after starting CPAP and set up ROV 2 months after starting CPAP.  ROV can be with me or NP.

## 2017-04-07 NOTE — Telephone Encounter (Signed)
Left voice mail on machine for patient to return phone call back regarding results. X1  

## 2017-04-08 NOTE — Telephone Encounter (Signed)
Pt is calling back [phone]

## 2017-04-08 NOTE — Telephone Encounter (Signed)
lmtcb x2 for pt. 

## 2017-04-08 NOTE — Telephone Encounter (Signed)
LMTCB

## 2017-04-11 NOTE — Telephone Encounter (Signed)
Called and spoke with pt letting him know the results of the HST and his two options given per VS.  Pt wanted to come in for an OV first to discuss HST in more detail.  Scheduled an OV with SG 04/18/17 at 9:15. Nothing further needed at this current time.

## 2017-04-18 ENCOUNTER — Encounter: Payer: Self-pay | Admitting: Acute Care

## 2017-04-18 ENCOUNTER — Ambulatory Visit (INDEPENDENT_AMBULATORY_CARE_PROVIDER_SITE_OTHER): Payer: PPO | Admitting: Acute Care

## 2017-04-18 VITALS — BP 118/70 | HR 79 | Ht 68.0 in | Wt 194.0 lb

## 2017-04-18 DIAGNOSIS — G4733 Obstructive sleep apnea (adult) (pediatric): Secondary | ICD-10-CM

## 2017-04-18 NOTE — Assessment & Plan Note (Signed)
Used CPAP in the past ( 13 years ago) Plan: We will order CPAP Therapy We will send in an order for mask fitting with Lynnae Sandhoff. Enroll in [employer]. Send order for auto CPAP range 5 to 15 cm H2O with heated humidity and mask of choice.   Have download sent 1 month after starting CPAP and set up ROV 2 months after starting CPAP.  Continue on CPAP at bedtime.  Goal is to wear for at least 6 hours each night for maximal clinical benefit. Continue to work on weight loss, as the link between excess weight  and sleep apnea is well established.  Do not drive if sleepy. Remember to clean mask, tubing, filter, and reservoir once weekly with soapy water.  Follow up with Dr. Halford Chessman  In  2 months or before as needed.   Please contact office for sooner follow up if symptoms do not improve or worsen or seek emergency care

## 2017-04-18 NOTE — Patient Instructions (Addendum)
It is good to see you today. We will order CPAP Therapy We will send in an order for mask fitting with Randall Reeves. Enroll in [employer]. Send order for auto CPAP range 5 to 15 cm H2O with heated humidity and mask of choice.   Have download sent 1 month after starting CPAP and set up ROV 2 months after starting CPAP.  Continue on CPAP at bedtime.  Goal is to wear for at least 6 hours each night for maximal clinical benefit. Continue to work on weight loss, as the link between excess weight  and sleep apnea is well established.  Do not drive if sleepy. Remember to clean mask, tubing, filter, and reservoir once weekly with soapy water.  Follow up with Randall Reeves  In  2 months or before as needed.   Please contact office for sooner follow up if symptoms do not improve or worsen or seek emergency care

## 2017-04-18 NOTE — Progress Notes (Signed)
History of Present Illness Randall Reeves is a 72 y.o. male never smoker with severe OSA. He is here to discuss the use of CPAP therapy, and to review his sleep study. Marland KitchenHe is followed by Dr. Halford Chessman.   04/18/2017  Pt. Presents today for follow up of Sleep Study results.. We discussed the results of his sleep study. I explained that he has severe sleep apnea, with both obstructive and central sleep apneas. I explained that his oxygen level drops with his sleep apnea . I explained that not treating his OSA puts him at risk of stroke, heart disease , HTN and several other health risk factors. He has used CPAP therapy in the past, 13 years ago, and found it too difficult to use. His wife stated that he told her 13 years ago before he quit that " He would rather die before he used the CPAP again". He understands it is in his best interest to try therapy again. We discussed that advances in technology  have made CPAP treatment more user friendly, and tolerable. He requested a copy of his sleep study which we did provide him with. He denies fever, chest pain, orthopnea or hemoptysis. He is agreeable to trying CPAP therapy again..  Test Results:  04/05/2017>> Home Sleep Study Home Sleep Study:04/05/2017>> HST 04/05/17 >> AHI 66.3, SaO2 low 81% severe sleep apnea.  Options are 1) CPAP now, 2) ROV first.   CBC Latest Ref Rng & Units 02/16/2017 12/28/2015 12/28/2015  WBC 4.0 - 10.5 K/uL 5.0 6.9 5.3  Hemoglobin 13.0 - 17.0 g/dL 14.9 14.3 14.4  Hematocrit 39.0 - 52.0 % 44.5 42.0 42.1  Platelets 150.0 - 400.0 K/uL 229.0 215 169    BMP Latest Ref Rng & Units 02/16/2017 12/28/2015 12/28/2015  Glucose 70 - 99 mg/dL 95 138(H) 115(H)  BUN 6 - 23 mg/dL 28(H) 28(H) 31(H)  Creatinine 0.40 - 1.50 mg/dL 1.28 1.34(H) 1.45(H)  Sodium 135 - 145 mEq/L 138 136 132(L)  Potassium 3.5 - 5.1 mEq/L 3.9 4.1 4.0  Chloride 96 - 112 mEq/L 102 104 99(L)  CO2 19 - 32 mEq/L 26 23 23   Calcium 8.4 - 10.5 mg/dL 9.4 9.1 9.0    BNP No  results found for: BNP  ProBNP No results found for: PROBNP  PFT No results found for: FEV1PRE, FEV1POST, FVCPRE, FVCPOST, TLC, DLCOUNC, PREFEV1FVCRT, PSTFEV1FVCRT  No results found.   Past medical hx Past Medical History:  Diagnosis Date  . Allergy   . CKD (chronic kidney disease), stage III ([place])   . Coronary atherosclerosis of unspecified type of vessel, native or graft 2011   MI but cath benign. Dr Otis Brace  . GERD (gastroesophageal reflux disease)   . Hyperlipidemia   . Hypertension   . Obstructive sleep apnea      Social History   Tobacco Use  . Smoking status: Never Smoker  . Smokeless tobacco: Never Used  Substance Use Topics  . Alcohol use: Yes    Alcohol/week: 1.8 - 3.0 oz    Types: 3 - 5 Cans of beer per week    Comment: a beer a day maybe  . Drug use: No    Mr.Beaudoin reports that he has never smoked. He has never used smokeless tobacco. He reports that he drinks about 1.8 - 3.0 oz of alcohol per week. He reports that he does not use drugs.  Tobacco Cessation: Never smoker   Past surgical hx, Family hx, Social hx all reviewed.  Current Outpatient Medications  on File Prior to Visit  Medication Sig  . atorvastatin (LIPITOR) 10 MG tablet Take 1 tablet (10 mg total) by mouth daily.  . hydrocortisone 2.5 % cream Apply topically 3 (three) times daily as needed.  Marland Kitchen losartan (COZAAR) 50 MG tablet take 1 tablet by mouth once daily  . Melatonin 5 MG TABS Take 1 tablet by mouth at bedtime.  Marland Kitchen omeprazole (PRILOSEC) 20 MG capsule Take 1 capsule (20 mg total) by mouth 2 (two) times daily.  Marland Kitchen triamcinolone cream (KENALOG) 0.1 % Apply 1 application topically 2 (two) times daily as needed.  Marland Kitchen aspirin EC 81 MG tablet Take 81 mg by mouth daily.  Marland Kitchen CIALIS 5 MG tablet take 1 tablet by mouth once daily (Patient not taking: Reported on 04/18/2017)  . Multiple Vitamin (MULTIVITAMIN) tablet Take 1 tablet by mouth daily.   Marland Kitchen PEG-KCl-NaCl-NaSulf-Na Asc-C (PLENVU) 140 g SOLR Take 140  g by mouth as directed. (Patient not taking: Reported on 04/18/2017)   No current facility-administered medications on file prior to visit.      Allergies  Allergen Reactions  . Bisoprolol-Hydrochlorothiazide     REACTION: muscle pain, cramps  . Codeine Nausea Only    Review Of Systems:  Constitutional:   No  weight loss, night sweats,  Fevers, chills, + fatigue, or  lassitude.  HEENT:   No headaches,  Difficulty swallowing,  Tooth/dental problems, or  Sore throat,                No sneezing, itching, ear ache, nasal congestion, post nasal drip,   CV:  No chest pain,  Orthopnea, PND, swelling in lower extremities, anasarca, dizziness, palpitations, syncope.   GI  No heartburn, indigestion, abdominal pain, nausea, vomiting, diarrhea, change in bowel habits, loss of appetite, bloody stools.   Resp: No shortness of breath with exertion or at rest.  No excess mucus, no productive cough,  No non-productive cough,  No coughing up of blood.  No change in color of mucus.  No wheezing.  No chest wall deformity  Skin: no rash or lesions.  GU: no dysuria, change in color of urine, no urgency or frequency.  No flank pain, no hematuria   MS:  No joint pain or swelling.  No decreased range of motion.  No back pain.  Psych:  No change in mood or affect. No depression or anxiety.  No memory loss.   Vital Signs BP 118/70 (BP Location: Left Arm, Cuff Size: Normal)   Pulse 79   Ht 5\' 8"  (1.727 m)   Wt 194 lb (88 kg)   SpO2 96%   BMI 29.50 kg/m    Physical Exam:  General- No distress,  A&Ox3, pleasant ENT: No sinus tenderness, TM clear, pale nasal mucosa, no oral exudate,no post nasal drip, no LAN Cardiac: S1, S2, regular rate and rhythm, no murmur Chest: No wheeze/ rales/ dullness; no accessory muscle use, no nasal flaring, no sternal retractions Abd.: Soft Non-tender, ND, Non-obese Ext: No clubbing cyanosis, edema Neuro:  normal strength, MAE x 4, A&O x 3 Skin: No rashes, warm and  dry Psych: normal mood and behavior   Assessment/Plan  Obstructive sleep apnea Used CPAP in the past ( 13 years ago) Plan: We will order CPAP Therapy We will send in an order for mask fitting with [facility]. Enroll in [employer]. Send order for auto CPAP range 5 to 15 cm H2O with heated humidity and mask of choice.   Have download sent 1 month after  starting CPAP and set up ROV 2 months after starting CPAP.  Continue on CPAP at bedtime.  Goal is to wear for at least 6 hours each night for maximal clinical benefit. Continue to work on weight loss, as the link between excess weight  and sleep apnea is well established.  Do not drive if sleepy. Remember to clean mask, tubing, filter, and reservoir once weekly with soapy water.  Follow up with Dr. Halford Chessman  In  2 months or before as needed.   Please contact office for sooner follow up if symptoms do not improve or worsen or seek emergency care       Magdalen Spatz, NP 04/18/2017  1:53 PM

## 2017-04-20 ENCOUNTER — Encounter: Payer: PPO | Admitting: Gastroenterology

## 2017-04-26 DIAGNOSIS — G4733 Obstructive sleep apnea (adult) (pediatric): Secondary | ICD-10-CM | POA: Diagnosis not present

## 2017-05-02 ENCOUNTER — Other Ambulatory Visit (INDEPENDENT_AMBULATORY_CARE_PROVIDER_SITE_OTHER): Payer: PPO

## 2017-05-02 DIAGNOSIS — N401 Enlarged prostate with lower urinary tract symptoms: Secondary | ICD-10-CM | POA: Diagnosis not present

## 2017-05-02 DIAGNOSIS — N138 Other obstructive and reflux uropathy: Secondary | ICD-10-CM

## 2017-05-02 DIAGNOSIS — I10 Essential (primary) hypertension: Secondary | ICD-10-CM | POA: Diagnosis not present

## 2017-05-02 LAB — PSA: PSA: 0.66 ng/mL (ref 0.10–4.00)

## 2017-05-02 LAB — HEPATIC FUNCTION PANEL
ALBUMIN: 4.1 g/dL (ref 3.5–5.2)
ALT: 22 U/L (ref 0–53)
AST: 19 U/L (ref 0–37)
Alkaline Phosphatase: 70 U/L (ref 39–117)
Bilirubin, Direct: 0.1 mg/dL (ref 0.0–0.3)
Total Bilirubin: 0.6 mg/dL (ref 0.2–1.2)
Total Protein: 6.9 g/dL (ref 6.0–8.3)

## 2017-05-11 ENCOUNTER — Ambulatory Visit (HOSPITAL_BASED_OUTPATIENT_CLINIC_OR_DEPARTMENT_OTHER): Payer: PPO | Attending: Acute Care | Admitting: Radiology

## 2017-05-11 DIAGNOSIS — G4733 Obstructive sleep apnea (adult) (pediatric): Secondary | ICD-10-CM

## 2017-05-16 ENCOUNTER — Other Ambulatory Visit (HOSPITAL_BASED_OUTPATIENT_CLINIC_OR_DEPARTMENT_OTHER): Payer: PPO

## 2017-05-24 ENCOUNTER — Other Ambulatory Visit: Payer: Self-pay

## 2017-05-24 ENCOUNTER — Ambulatory Visit (AMBULATORY_SURGERY_CENTER): Payer: PPO | Admitting: Gastroenterology

## 2017-05-24 ENCOUNTER — Encounter: Payer: Self-pay | Admitting: Gastroenterology

## 2017-05-24 VITALS — BP 119/85 | HR 72 | Temp 98.4°F | Resp 12 | Ht 68.0 in | Wt 195.0 lb

## 2017-05-24 DIAGNOSIS — D122 Benign neoplasm of ascending colon: Secondary | ICD-10-CM | POA: Diagnosis not present

## 2017-05-24 DIAGNOSIS — K219 Gastro-esophageal reflux disease without esophagitis: Secondary | ICD-10-CM

## 2017-05-24 DIAGNOSIS — Z1211 Encounter for screening for malignant neoplasm of colon: Secondary | ICD-10-CM | POA: Diagnosis not present

## 2017-05-24 MED ORDER — SODIUM CHLORIDE 0.9 % IV SOLN: 500.0000 mL | Freq: Once | INTRAVENOUS | Status: DC

## 2017-05-24 NOTE — Patient Instructions (Signed)
Discharge instructions given. Handouts on polyps and a hiatal hernia. Resume previous medications. YOU HAD AN ENDOSCOPIC PROCEDURE TODAY AT [facility]:   Refer to the procedure report that was given to you for any specific questions about what was found during the examination.  If the procedure report does not answer your questions, please call your gastroenterologist to clarify.  If you requested that your care partner not be given the details of your procedure findings, then the procedure report has been included in a sealed envelope for you to review at your convenience later.  YOU SHOULD EXPECT: Some feelings of bloating in the abdomen. Passage of more gas than usual.  Walking can help get rid of the air that was put into your GI tract during the procedure and reduce the bloating. If you had a lower endoscopy (such as a colonoscopy or flexible sigmoidoscopy) you [place] notice spotting of blood in your stool or on the toilet paper. If you underwent a bowel prep for your procedure, you [place] not have a normal bowel movement for a few days.  Please Note:  You might notice some irritation and congestion in your nose or some drainage.  This is from the oxygen used during your procedure.  There is no need for concern and it should clear up in a day or so.  SYMPTOMS TO REPORT IMMEDIATELY:   Following lower endoscopy (colonoscopy or flexible sigmoidoscopy):  Excessive amounts of blood in the stool  Significant tenderness or worsening of abdominal pains  Swelling of the abdomen that is new, acute  Fever of 100F or higher   Following upper endoscopy (EGD)  Vomiting of blood or coffee ground material  New chest pain or pain under the shoulder blades  Painful or persistently difficult swallowing  New shortness of breath  Fever of 100F or higher  Black, tarry-looking stools  For urgent or emergent issues, a gastroenterologist can be reached at any hour by calling [phone]  [phone].   DIET:  We do recommend a small meal at first, but then you Kuc proceed to your regular diet.  Drink plenty of fluids but you should avoid alcoholic beverages for 24 hours.  ACTIVITY:  You should plan to take it easy for the rest of today and you should NOT DRIVE or use heavy machinery until tomorrow (because of the sedation medicines used during the test).    FOLLOW UP: Our staff will call the number listed on your records the next business day following your procedure to check on you and address any questions or concerns that you [place] have regarding the information given to you following your procedure. If we do not reach you, we will leave a message.  However, if you are feeling well and you are not experiencing any problems, there is no need to return our call.  We will assume that you have returned to your regular daily activities without incident.  If any biopsies were taken you will be contacted by phone or by letter within the next 1-3 weeks.  Please call us at [phone] if you have not heard about the biopsies in 3 weeks.    SIGNATURES/CONFIDENTIALITY: You and/or your care partner have signed paperwork which will be entered into your electronic medical record.  These signatures attest to the fact that that the information above on your After Visit Summary has been reviewed and is understood.  Full responsibility of the confidentiality of this discharge information lies with you and/or your  care-partner.

## 2017-05-24 NOTE — Progress Notes (Signed)
Spontaneous respirations throughout. VSS. Resting comfortably. To PACU on room air. Report to  RN. 

## 2017-05-24 NOTE — Op Note (Signed)
[facility] Patient Name: Randall Reeves Procedure Date: 05/24/2017 4:03 PM MRN: [id number] Endoscopist: Mallie Mussel L. Loletha Carrow , MD Age: 72 Referring MD:  Date of Birth: 1945-02-01 Gender: Male Account #: [id number] Procedure:                Colonoscopy Indications:              Screening for colorectal malignant neoplasm (no                            polyps 04/2007) Medicines:                Monitored Anesthesia Care Procedure:                Pre-Anesthesia Assessment:                           - Prior to the procedure, a History and Physical                            was performed, and patient medications and                            allergies were reviewed. The patient's tolerance of                            previous anesthesia was also reviewed. The risks                            and benefits of the procedure and the sedation                            options and risks were discussed with the patient.                            All questions were answered, and informed consent                            was obtained. Prior Anticoagulants: The patient has                            taken no previous anticoagulant or antiplatelet                            agents. ASA Grade Assessment: II - A patient with                            mild systemic disease. After reviewing the risks                            and benefits, the patient was deemed in                            satisfactory condition to undergo the procedure.  After obtaining informed consent, the colonoscope                            was passed under direct vision. Throughout the                            procedure, the patient's blood pressure, pulse, and                            oxygen saturations were monitored continuously. The                            Model CF-HQ190L [id number]) scope was introduced                            through the anus and advanced to the the cecum,                        identified by appendiceal orifice and ileocecal                            valve. The colonoscopy was performed without                            difficulty. The patient tolerated the procedure                            well. The quality of the bowel preparation was                            excellent. The ileocecal valve, appendiceal                            orifice, and rectum were photographed. Scope In: 4:10:12 PM Scope Out: 4:26:12 PM Scope Withdrawal Time: 0 hours 12 minutes 39 seconds  Total Procedure Duration: 0 hours 16 minutes 0 seconds  Findings:                 The perianal and digital rectal examinations were                            normal.                           A 15 x 8 mm polyp was found in the distal ascending                            colon. The polyp was flat with a mucous cap                            (probable SSP by NBI appearance). The polyp was                            removed with a piecemeal technique using a hot and  cold snare. Resection and retrieval were complete.                           The exam was otherwise without abnormality on                            direct and retroflexion views. Complications:            No immediate complications. Estimated Blood Loss:     Estimated blood loss was minimal. Impression:               - One 15 x 8 mm polyp in the distal ascending                            colon, removed piecemeal using a hot snare.                            Resected and retrieved.                           - The examination was otherwise normal on direct                            and retroflexion views. Recommendation:           - Patient has a contact number available for                            emergencies. The signs and symptoms of potential                            delayed complications were discussed with the                            patient. Return to normal activities  tomorrow.                            Written discharge instructions were provided to the                            patient.                           - Resume previous diet.                           - Continue present medications.                           - Await pathology results.                           - Repeat colonoscopy is recommended for                            surveillance. The colonoscopy date will be  determined after pathology results from today's                            exam become available for review. Aurther Harlin L. Loletha Carrow, MD 05/24/2017 4:34:01 PM This report has been signed electronically.

## 2017-05-24 NOTE — Op Note (Signed)
[facility] Patient Name: Randall Reeves Procedure Date: 05/24/2017 4:03 PM MRN: [id number] Endoscopist: Mallie Mussel L. Loletha Carrow , MD Age: 72 Referring MD:  Date of Birth: [birth year] Gender: Male Account #: [id number] Procedure:                Upper GI endoscopy Indications:              Esophageal reflux Medicines:                Monitored Anesthesia Care Procedure:                Pre-Anesthesia Assessment:                           - Prior to the procedure, a History and Physical                            was performed, and patient medications and                            allergies were reviewed. The patient's tolerance of                            previous anesthesia was also reviewed. The risks                            and benefits of the procedure and the sedation                            options and risks were discussed with the patient.                            All questions were answered, and informed consent                            was obtained. Prior Anticoagulants: The patient has                            taken no previous anticoagulant or antiplatelet                            agents. ASA Grade Assessment: II - A patient with                            mild systemic disease. After reviewing the risks                            and benefits, the patient was deemed in                            satisfactory condition to undergo the procedure.                           After obtaining informed consent, the endoscope was  passed under direct vision. Throughout the                            procedure, the patient's blood pressure, pulse, and                            oxygen saturations were monitored continuously. The                            Endoscope was introduced through the mouth, and                            advanced to the second part of duodenum. The upper                            GI endoscopy was accomplished without  difficulty.                            The patient tolerated the procedure well. Scope In: Scope Out: Findings:                 The larynx was normal.                           A small sliding hiatal hernia was present.                           There is no endoscopic evidence of Barrett's                            esophagus or esophagitis in the entire esophagus.                           The stomach was normal.                           The cardia and gastric fundus were normal on                            retroflexion.                           The examined duodenum was normal. Complications:            No immediate complications. Estimated Blood Loss:     Estimated blood loss: none. Impression:               - Normal larynx.                           - Small hiatal hernia.                           - Normal stomach.                           - Normal examined duodenum.                           -  No specimens collected. Recommendation:           - Patient has a contact number available for                            emergencies. The signs and symptoms of potential                            delayed complications were discussed with the                            patient. Return to normal activities tomorrow.                            Written discharge instructions were provided to the                            patient.                           - Resume previous diet.                           - Continue present medications.                           - See the other procedure note for documentation of                            additional recommendations.                           - Follow an antireflux regimen indefinitely. Tamika Shropshire L. Loletha Carrow, MD 05/24/2017 4:30:43 PM This report has been signed electronically.

## 2017-05-24 NOTE — Progress Notes (Signed)
Called to room to assist during endoscopic procedure.  Patient ID and intended procedure confirmed with present staff. Received instructions for my participation in the procedure from the performing physician.  

## 2017-05-24 NOTE — Progress Notes (Signed)
Pt's states no medical or surgical changes since previsit or office visit. 

## 2017-05-25 ENCOUNTER — Telehealth: Payer: Self-pay

## 2017-05-25 NOTE — Telephone Encounter (Signed)
  Follow up Call-  Call Willo Yoon number 05/24/2017  Post procedure Call Miyoshi Ligas phone  # [phone]  Permission to leave phone message Yes  Some recent data might be hidden     Patient questions:  Do you have a fever, pain , or abdominal swelling? No. Pain Score  0 *  Have you tolerated food without any problems? Yes.    Have you been able to return to your normal activities? Yes.    Do you have any questions about your discharge instructions: Diet   No. Medications  No. Follow up visit  No.  Do you have questions or concerns about your Care? No.  Actions: * If pain score is 4 or above: No action needed, pain <4.

## 2017-05-26 DIAGNOSIS — G4733 Obstructive sleep apnea (adult) (pediatric): Secondary | ICD-10-CM | POA: Diagnosis not present

## 2017-06-02 ENCOUNTER — Encounter: Payer: Self-pay | Admitting: Gastroenterology

## 2017-06-13 ENCOUNTER — Other Ambulatory Visit: Payer: Self-pay | Admitting: Internal Medicine

## 2017-06-22 ENCOUNTER — Ambulatory Visit: Payer: PPO | Admitting: Pulmonary Disease

## 2017-06-23 DIAGNOSIS — G4733 Obstructive sleep apnea (adult) (pediatric): Secondary | ICD-10-CM | POA: Diagnosis not present

## 2017-06-26 DIAGNOSIS — G4733 Obstructive sleep apnea (adult) (pediatric): Secondary | ICD-10-CM | POA: Diagnosis not present

## 2017-06-27 ENCOUNTER — Encounter: Payer: Self-pay | Admitting: Pulmonary Disease

## 2017-06-27 ENCOUNTER — Ambulatory Visit: Payer: PPO | Admitting: Pulmonary Disease

## 2017-06-27 VITALS — BP 146/70 | HR 80 | Ht 69.5 in | Wt 195.0 lb

## 2017-06-27 DIAGNOSIS — G4733 Obstructive sleep apnea (adult) (pediatric): Secondary | ICD-10-CM

## 2017-06-27 NOTE — Progress Notes (Signed)
[facility]  Chief Complaint  Patient presents with  . Follow-up    C/o sore on nose from CPAP mask x 2 mths.,has also tried nasal pillows and had sores in nose in past,?if silicone causing, now trying mask with foam.Pr. good.    Vital signs: BP (!) 146/70 (BP Location: Left Arm, Cuff Size: Normal)   Pulse 80   Ht 5' 9.5" (1.765 m)   Wt 195 lb (88.5 kg)   SpO2 94%   BMI 28.38 kg/m   History of Present Illness: Randall Reeves is a 72 y.o. male with obstructive sleep apnea.  His sleep study from March showed severe sleep apnea.  He was started on CPAP.  He feels CPAP has helped his sleep.  His main issue has been related to irritation over the bridge of his nose from his mask.  He thinks he had a reaction to silicone in mask.  He isn't sure if mask was causing too much pressure.  He is due to get a different mask.  Nose sore is getting better.    Physical Exam:  General - pleasant Eyes - pupils reactive ENT - no sinus tenderness, no oral exudate, no LAN, MP 4, scalloped tongue, abrasion over bridge of nose Cardiac - regular, no murmur Chest - no wheeze, rales Abd - soft, non tender Ext - no edema Skin - no rashes Neuro - normal strength Psych - normal mood  Assessment/Plan:  Obstructive sleep apnea. - he is compliant with CPAP and reports benefit from therapy - continue auto CPAP - discussed options for mask fit - advised he could try using band aid over his nasal bridge until his lesion heals   Patient Instructions  Can look up CPAP mask options at [email or web address] or similar website  Follow up in 1 year    Chesley Mires, MD [place] 06/27/2017, 9:59 AM Pager:  [phone]  Flow Sheet  Sleep tests: PSG 10/15/04 >> RDI 22, SpO2 low 85% HST 04/05/17 >> AHI 66.3, SaO2 low 81% Auto CPAP 05/28/17 to 06/26/17 >> used on 30 of 30 nights with average 6 hrs 53 min.  Average AHI 3 with median CPAP 7 and 95 th percentile  CPAP 10 cm H2O  Cardiac tests: Echo 12/29/15 >> EF 55 to 60%, grade 2 DD  Past Medical History: He  has a past medical history of Allergy, CKD (chronic kidney disease), stage III ([place]), Coronary atherosclerosis of unspecified type of vessel, native or graft (2011), GERD (gastroesophageal reflux disease), Hyperlipidemia, Hypertension, and Obstructive sleep apnea.  Past Surgical History: He  has a past surgical history that includes Cardiac catheterization; Carpal tunnel release (Left, 02/2013); Vasectomy (1983); Spermatocelectomy (1998); and Inguinal hernia repair (Left, 1983).  Family History: His family history includes Asthma in his father; Diabetes in his brother, father, and sister; Heart disease in his brother and father; Heart failure in his father; Hypertension in his brother, brother, brother, brother, mother, and sister; Neuropathy in his brother; Prostate cancer in his brother; Stroke in his mother.  Social History: He  reports that he has never smoked. He has never used smokeless tobacco. He reports that he drinks about 1.8 - 3.0 oz of alcohol per week. He reports that he does not use drugs.  Medications: Allergies as of 06/27/2017      Reactions   Bisoprolol-hydrochlorothiazide    REACTION: muscle pain, cramps   Codeine Nausea Only      Medication List  Accurate as of 06/27/17  9:59 AM. Always use your most recent med list.          aspirin EC 81 MG tablet Take 81 mg by mouth daily.   atorvastatin 10 MG tablet Commonly known as:  LIPITOR Take 1 tablet (10 mg total) by mouth daily.   CIALIS 5 MG tablet Generic drug:  tadalafil take 1 tablet by mouth once daily   hydrocortisone 2.5 % cream Apply topically 3 (three) times daily as needed.   losartan 50 MG tablet Commonly known as:  COZAAR TAKE 1 TABLET BY MOUTH ONCE DAILY   Melatonin 5 MG Tabs Take 1 tablet by mouth at bedtime.   multivitamin tablet Take 1 tablet by mouth daily.   omeprazole 20 MG  capsule Commonly known as:  PRILOSEC Take 1 capsule (20 mg total) by mouth 2 (two) times daily.   triamcinolone cream 0.1 % Commonly known as:  KENALOG Apply 1 application topically 2 (two) times daily as needed.

## 2017-06-27 NOTE — Patient Instructions (Signed)
Can look up CPAP mask options at CPAP.com or similar website  Follow up in 1 year

## 2017-06-30 DIAGNOSIS — H2513 Age-related nuclear cataract, bilateral: Secondary | ICD-10-CM | POA: Diagnosis not present

## 2017-07-26 DIAGNOSIS — G4733 Obstructive sleep apnea (adult) (pediatric): Secondary | ICD-10-CM | POA: Diagnosis not present

## 2017-08-26 DIAGNOSIS — G4733 Obstructive sleep apnea (adult) (pediatric): Secondary | ICD-10-CM | POA: Diagnosis not present

## 2017-09-26 DIAGNOSIS — G4733 Obstructive sleep apnea (adult) (pediatric): Secondary | ICD-10-CM | POA: Diagnosis not present

## 2017-09-28 DIAGNOSIS — G4733 Obstructive sleep apnea (adult) (pediatric): Secondary | ICD-10-CM | POA: Diagnosis not present

## 2017-10-26 DIAGNOSIS — G4733 Obstructive sleep apnea (adult) (pediatric): Secondary | ICD-10-CM | POA: Diagnosis not present

## 2017-10-27 DIAGNOSIS — M1711 Unilateral primary osteoarthritis, right knee: Secondary | ICD-10-CM | POA: Diagnosis not present

## 2017-11-26 DIAGNOSIS — G4733 Obstructive sleep apnea (adult) (pediatric): Secondary | ICD-10-CM | POA: Diagnosis not present

## 2017-12-15 ENCOUNTER — Other Ambulatory Visit: Payer: Self-pay | Admitting: Internal Medicine

## 2017-12-26 DIAGNOSIS — G4733 Obstructive sleep apnea (adult) (pediatric): Secondary | ICD-10-CM | POA: Diagnosis not present

## 2018-01-26 DIAGNOSIS — G4733 Obstructive sleep apnea (adult) (pediatric): Secondary | ICD-10-CM | POA: Diagnosis not present

## 2018-02-20 DIAGNOSIS — L570 Actinic keratosis: Secondary | ICD-10-CM | POA: Diagnosis not present

## 2018-02-20 DIAGNOSIS — D2262 Melanocytic nevi of left upper limb, including shoulder: Secondary | ICD-10-CM | POA: Diagnosis not present

## 2018-02-20 DIAGNOSIS — D225 Melanocytic nevi of trunk: Secondary | ICD-10-CM | POA: Diagnosis not present

## 2018-02-20 DIAGNOSIS — D2271 Melanocytic nevi of right lower limb, including hip: Secondary | ICD-10-CM | POA: Diagnosis not present

## 2018-02-20 DIAGNOSIS — X32XXXA Exposure to sunlight, initial encounter: Secondary | ICD-10-CM | POA: Diagnosis not present

## 2018-02-20 DIAGNOSIS — D2261 Melanocytic nevi of right upper limb, including shoulder: Secondary | ICD-10-CM | POA: Diagnosis not present

## 2018-02-20 DIAGNOSIS — Z85828 Personal history of other malignant neoplasm of skin: Secondary | ICD-10-CM | POA: Diagnosis not present

## 2018-02-20 DIAGNOSIS — Z08 Encounter for follow-up examination after completed treatment for malignant neoplasm: Secondary | ICD-10-CM | POA: Diagnosis not present

## 2018-02-20 DIAGNOSIS — L820 Inflamed seborrheic keratosis: Secondary | ICD-10-CM | POA: Diagnosis not present

## 2018-02-20 DIAGNOSIS — D2272 Melanocytic nevi of left lower limb, including hip: Secondary | ICD-10-CM | POA: Diagnosis not present

## 2018-02-22 ENCOUNTER — Encounter: Payer: Self-pay | Admitting: Internal Medicine

## 2018-02-22 ENCOUNTER — Ambulatory Visit (INDEPENDENT_AMBULATORY_CARE_PROVIDER_SITE_OTHER): Payer: PPO | Admitting: Internal Medicine

## 2018-02-22 VITALS — BP 132/84 | HR 83 | Temp 97.5°F | Ht 68.5 in | Wt 198.0 lb

## 2018-02-22 DIAGNOSIS — N401 Enlarged prostate with lower urinary tract symptoms: Secondary | ICD-10-CM | POA: Diagnosis not present

## 2018-02-22 DIAGNOSIS — Z0000 Encounter for general adult medical examination without abnormal findings: Secondary | ICD-10-CM

## 2018-02-22 DIAGNOSIS — G4733 Obstructive sleep apnea (adult) (pediatric): Secondary | ICD-10-CM

## 2018-02-22 DIAGNOSIS — I2510 Atherosclerotic heart disease of native coronary artery without angina pectoris: Secondary | ICD-10-CM | POA: Diagnosis not present

## 2018-02-22 DIAGNOSIS — N183 Chronic kidney disease, stage 3 unspecified: Secondary | ICD-10-CM

## 2018-02-22 DIAGNOSIS — I728 Aneurysm of other specified arteries: Secondary | ICD-10-CM

## 2018-02-22 DIAGNOSIS — Z7189 Other specified counseling: Secondary | ICD-10-CM

## 2018-02-22 DIAGNOSIS — N138 Other obstructive and reflux uropathy: Secondary | ICD-10-CM | POA: Diagnosis not present

## 2018-02-22 DIAGNOSIS — I722 Aneurysm of renal artery: Secondary | ICD-10-CM | POA: Diagnosis not present

## 2018-02-22 DIAGNOSIS — I2583 Coronary atherosclerosis due to lipid rich plaque: Secondary | ICD-10-CM | POA: Diagnosis not present

## 2018-02-22 LAB — COMPREHENSIVE METABOLIC PANEL
ALT: 26 U/L (ref 0–53)
AST: 21 U/L (ref 0–37)
Albumin: 4.4 g/dL (ref 3.5–5.2)
Alkaline Phosphatase: 75 U/L (ref 39–117)
BUN: 23 mg/dL (ref 6–23)
CHLORIDE: 102 meq/L (ref 96–112)
CO2: 30 meq/L (ref 19–32)
CREATININE: 1.37 mg/dL (ref 0.40–1.50)
Calcium: 10 mg/dL (ref 8.4–10.5)
GFR: 51.31 mL/min — ABNORMAL LOW (ref >60.00)
Glucose, Bld: 73 mg/dL (ref 70–99)
Potassium: 4.2 mEq/L (ref 3.5–5.1)
Sodium: 138 mEq/L (ref 135–145)
Total Bilirubin: 0.5 mg/dL (ref 0.2–1.2)
Total Protein: 7.6 g/dL (ref 6.0–8.3)

## 2018-02-22 LAB — CBC
HCT: 44.9 % (ref 39.0–52.0)
Hemoglobin: 15.3 g/dL (ref 13.0–17.0)
MCHC: 34.1 g/dL (ref 30.0–36.0)
MCV: 94.8 fl (ref 78.0–100.0)
PLATELETS: 229 10*3/uL (ref 150.0–400.0)
RBC: 4.73 Mil/uL (ref 4.22–5.81)
RDW: 12.9 % (ref 11.5–15.5)
WBC: 6 10*3/uL (ref 4.0–10.5)

## 2018-02-22 LAB — LIPID PANEL
CHOL/HDL RATIO: 3
Cholesterol: 152 mg/dL (ref 0–200)
HDL: 45.1 mg/dL (ref >39.00)
NONHDL: 106.46
Triglycerides: 266 mg/dL — ABNORMAL HIGH (ref 0.0–149.0)
VLDL: 53.2 mg/dL — ABNORMAL HIGH (ref 0.0–40.0)

## 2018-02-22 LAB — LDL CHOLESTEROL, DIRECT: Direct LDL: 68 mg/dL

## 2018-02-22 NOTE — Assessment & Plan Note (Signed)
Mild problems

## 2018-02-22 NOTE — Assessment & Plan Note (Signed)
Will be due for recheck in 2023 On statin/ASA

## 2018-02-22 NOTE — Assessment & Plan Note (Signed)
Borderline Is on ARB

## 2018-02-22 NOTE — Assessment & Plan Note (Signed)
See social history 

## 2018-02-22 NOTE — Assessment & Plan Note (Signed)
Has no symptoms On appropriate Rx

## 2018-02-22 NOTE — Assessment & Plan Note (Signed)
Follow up per Dr Oneida Alar

## 2018-02-22 NOTE — Progress Notes (Signed)
Subjective:    Patient ID: Randall Reeves, male    DOB: 10/08/1945, 73 y.o.   MRN: [id number]  HPI Here for Medicare wellness visit and follow up of chronic health conditions Reviewed form and advanced directives Reviewed other doctors No tobacco Rare beer Walks occasionally but not much. No other exercise Tripped once this year. No injury No depression or anhedonia Vision is okay--due for exam Mild hearing loss--especially per wife. Has looked into audiology evaluation Independent with instrumental ADLs No sig memory issues  Having aches and pains "like I never had before" "Stitch" in lower left abdomen---notices it when still in evening Not related to evening Bowels are fine---right after breakfast Does load wood boiler--some heavy pieces Mild symptoms in joints---- 1 ankle/hip/knee. Occasionally takes ibuprofen  Discussed borderline low GFR Discussed care with NSAIDs  Has splenic and renal aneurysms Extended follow up now with Dr Oneida Alar Is on statin and ASA  1-2 episodes of vague chest sensation--- substernal Doesn't remember circumstances and didn't really bother him Randall Reeves is down--"legs get tired" sooner Will get easier DOE now (like if pushes walking to 2 miles) No palpitations Slight dizziness at times--no syncope  Still wears CPAP Has good compliance---but doesn't sleep all the time Some daytime somnolence--but not usually bad Naps do help  Takes PPI daily Controls heartburn No dysphagia  Current Outpatient Medications on File Prior to Visit  Medication Sig Dispense Refill  . aspirin EC 81 MG tablet Take 81 mg by mouth daily.    Marland Kitchen atorvastatin (LIPITOR) 10 MG tablet TAKE 1 TABLET BY MOUTH ONCE A DAY 90 tablet 0  . CIALIS 5 MG tablet take 1 tablet by mouth once daily 90 tablet 3  . hydrocortisone 2.5 % cream Apply topically 3 (three) times daily as needed. 56 g 3  . losartan (COZAAR) 50 MG tablet TAKE 1 TABLET BY MOUTH ONCE A DAY 90 tablet 0  . Multiple  Vitamin (MULTIVITAMIN) tablet Take 1 tablet by mouth daily.     Marland Kitchen omeprazole (PRILOSEC) 20 MG capsule Take 1 capsule (20 mg total) by mouth 2 (two) times daily. 180 capsule 3  . triamcinolone cream (KENALOG) 0.1 % Apply 1 application topically 2 (two) times daily as needed. 45 g 1   No current facility-administered medications on file prior to visit.     Allergies  Allergen Reactions  . Bisoprolol-Hydrochlorothiazide     REACTION: muscle pain, cramps  . Codeine Nausea Only    Past Medical History:  Diagnosis Date  . Allergy   . CKD (chronic kidney disease), stage III ([place])   . Coronary atherosclerosis of unspecified type of vessel, native or graft 2011   MI but cath benign. Dr Otis Brace  . GERD (gastroesophageal reflux disease)   . Hyperlipidemia   . Hypertension   . Obstructive sleep apnea    Cpap as of 27 days ago    Past Surgical History:  Procedure Laterality Date  . CARDIAC CATHETERIZATION    . CARPAL TUNNEL RELEASE Left 02/2013  . INGUINAL HERNIA REPAIR Left 1983   with vasectomy  . SPERMATOCELECTOMY  1998  . VASECTOMY  1983   with inguinal hernia repair    Family History  Problem Relation Age of Onset  . Stroke Mother   . Hypertension Mother   . Diabetes Father   . Asthma Father   . Heart failure Father   . Heart disease Father   . Hypertension Sister   . Diabetes Sister   . Neuropathy  Brother   . Hypertension Brother   . Diabetes Brother   . Heart disease Brother   . Prostate cancer Brother   . Hypertension Brother   . Hypertension Brother   . Hypertension Brother     Social History   Socioeconomic History  . Marital status: Married    Spouse name: Not on file  . Number of children: 2  . Years of education: Not on file  . Highest education level: Not on file  Occupational History  . Occupation: Chief Strategy Officer  . Financial resource strain: Not on file  . Food insecurity:    Worry: Not on file    Inability: Not on file  .  Transportation needs:    Medical: Not on file    Non-medical: Not on file  Tobacco Use  . Smoking status: Never Smoker  . Smokeless tobacco: Never Used  Substance and Sexual Activity  . Alcohol use: Yes    Alcohol/week: 3.0 - 5.0 standard drinks    Types: 3 - 5 Cans of beer per week    Comment: a beer a day maybe  . Drug use: No  . Sexual activity: Not on file  Lifestyle  . Physical activity:    Days per week: Not on file    Minutes per session: Not on file  . Stress: Not on file  Relationships  . Social connections:    Talks on phone: Not on file    Gets together: Not on file    Attends religious service: Not on file    Active member of club or organization: Not on file    Attends meetings of clubs or organizations: Not on file    Relationship status: Not on file  . Intimate partner violence:    Fear of current or ex partner: Not on file    Emotionally abused: Not on file    Physically abused: Not on file    Forced sexual activity: Not on file  Other Topics Concern  . Not on file  Social History Narrative   Has living will   Wife is his health care POA--alternate is son   Would accept resuscitation attempts.   Would leave feeding tube decision to his wife   Review of Systems Voids fine. Some urgency--flow is fine though. Nocturia x 1 Appetite is good Weight is up some from last year Wears seat belt Teeth are fine---keeps up with dentist Recent derm visit---8 actinics frozen      Objective:   Physical Exam  Constitutional: He is oriented to person, place, and time. He appears well-developed. No distress.  HENT:  Mouth/Throat: Oropharynx is clear and moist. No oropharyngeal exudate.  Neck: No thyromegaly present.  Cardiovascular: Normal rate, regular rhythm, normal heart sounds and intact distal pulses. Exam reveals no gallop.  No murmur heard. Respiratory: Effort normal and breath sounds normal. No respiratory distress. He has no wheezes. He has no rales.    GI: Soft. He exhibits no distension. There is no abdominal tenderness. There is no rebound and no guarding.  No hernia or tenderness  Musculoskeletal:        General: No tenderness or edema.  Lymphadenopathy:    He has no cervical adenopathy.  Neurological: He is alert and oriented to person, place, and time.  President--- "Daisy Floro, Elyn Peers, [place] guy--father and son" [phone] [id number] Recall 3/3  Skin: No rash noted. No erythema.  Psychiatric: He has a normal mood and affect. His behavior is  normal.           Assessment & Plan:

## 2018-02-22 NOTE — Assessment & Plan Note (Signed)
Not always sleeping great despite this

## 2018-02-22 NOTE — Assessment & Plan Note (Signed)
I have personally reviewed the Medicare Annual Wellness questionnaire and have noted 1. The patient's medical and social history 2. Their use of alcohol, tobacco or illicit drugs 3. Their current medications and supplements 4. The patient's functional ability including ADL's, fall risks, home safety risks and hearing or visual             impairment. 5. Diet and physical activities 6. Evidence for depression or mood disorders  The patients weight, height, BMI and visual acuity have been recorded in the chart I have made referrals, counseling and provided education to the patient based review of the above and I have provided the pt with a written personalized care plan for preventive services.  I have provided you with a copy of your personalized plan for preventive services. Please take the time to review along with your updated medication list.  Yearly flu vaccine Will consider the shingrix No more PSAs Colon due in April (repeat due to large polyp) Discussed fitness

## 2018-02-22 NOTE — Progress Notes (Signed)
Hearing Screening   Method: Audiometry   125Hz  250Hz  500Hz  1000Hz  2000Hz  3000Hz  4000Hz  6000Hz  8000Hz   Right ear:   20 20 20   0    Left ear:   20 20 20   0    Vision Screening Comments: April 2019

## 2018-02-26 DIAGNOSIS — G4733 Obstructive sleep apnea (adult) (pediatric): Secondary | ICD-10-CM | POA: Diagnosis not present

## 2018-03-18 ENCOUNTER — Other Ambulatory Visit: Payer: Self-pay | Admitting: Internal Medicine

## 2018-04-20 ENCOUNTER — Other Ambulatory Visit: Payer: Self-pay | Admitting: Internal Medicine

## 2018-04-22 ENCOUNTER — Observation Stay (HOSPITAL_COMMUNITY)
Admission: EM | Admit: 2018-04-22 | Discharge: 2018-04-23 | Disposition: A | Discharge status: Other Acute Inpatient Hospital | Payer: PPO | Attending: Surgery | Admitting: Surgery

## 2018-04-22 ENCOUNTER — Emergency Department (HOSPITAL_COMMUNITY): Payer: PPO

## 2018-04-22 ENCOUNTER — Encounter (HOSPITAL_COMMUNITY): Payer: Self-pay | Admitting: Emergency Medicine

## 2018-04-22 ENCOUNTER — Other Ambulatory Visit: Payer: Self-pay

## 2018-04-22 DIAGNOSIS — S2242XA Multiple fractures of ribs, left side, initial encounter for closed fracture: Secondary | ICD-10-CM

## 2018-04-22 DIAGNOSIS — N183 Chronic kidney disease, stage 3 (moderate): Secondary | ICD-10-CM | POA: Diagnosis not present

## 2018-04-22 DIAGNOSIS — I252 Old myocardial infarction: Secondary | ICD-10-CM | POA: Insufficient documentation

## 2018-04-22 DIAGNOSIS — W19XXXA Unspecified fall, initial encounter: Secondary | ICD-10-CM

## 2018-04-22 DIAGNOSIS — S2239XA Fracture of one rib, unspecified side, initial encounter for closed fracture: Secondary | ICD-10-CM | POA: Diagnosis present

## 2018-04-22 DIAGNOSIS — I129 Hypertensive chronic kidney disease with stage 1 through stage 4 chronic kidney disease, or unspecified chronic kidney disease: Secondary | ICD-10-CM | POA: Insufficient documentation

## 2018-04-22 DIAGNOSIS — Z79899 Other long term (current) drug therapy: Secondary | ICD-10-CM | POA: Insufficient documentation

## 2018-04-22 DIAGNOSIS — G4733 Obstructive sleep apnea (adult) (pediatric): Secondary | ICD-10-CM | POA: Insufficient documentation

## 2018-04-22 DIAGNOSIS — I700 Atherosclerosis of aorta: Secondary | ICD-10-CM | POA: Diagnosis not present

## 2018-04-22 DIAGNOSIS — N529 Male erectile dysfunction, unspecified: Secondary | ICD-10-CM | POA: Diagnosis not present

## 2018-04-22 DIAGNOSIS — S0990XA Unspecified injury of head, initial encounter: Secondary | ICD-10-CM | POA: Diagnosis not present

## 2018-04-22 DIAGNOSIS — I2510 Atherosclerotic heart disease of native coronary artery without angina pectoris: Secondary | ICD-10-CM | POA: Insufficient documentation

## 2018-04-22 DIAGNOSIS — N400 Enlarged prostate without lower urinary tract symptoms: Secondary | ICD-10-CM | POA: Insufficient documentation

## 2018-04-22 DIAGNOSIS — M546 Pain in thoracic spine: Secondary | ICD-10-CM | POA: Diagnosis not present

## 2018-04-22 DIAGNOSIS — E785 Hyperlipidemia, unspecified: Secondary | ICD-10-CM | POA: Insufficient documentation

## 2018-04-22 DIAGNOSIS — Z7982 Long term (current) use of aspirin: Secondary | ICD-10-CM | POA: Insufficient documentation

## 2018-04-22 DIAGNOSIS — M549 Dorsalgia, unspecified: Secondary | ICD-10-CM | POA: Diagnosis not present

## 2018-04-22 DIAGNOSIS — S3993XA Unspecified injury of pelvis, initial encounter: Secondary | ICD-10-CM | POA: Diagnosis not present

## 2018-04-22 DIAGNOSIS — W3189XA Contact with other specified machinery, initial encounter: Secondary | ICD-10-CM | POA: Insufficient documentation

## 2018-04-22 DIAGNOSIS — M542 Cervicalgia: Secondary | ICD-10-CM | POA: Diagnosis not present

## 2018-04-22 DIAGNOSIS — R000 Tachycardia, unspecified: Secondary | ICD-10-CM | POA: Diagnosis not present

## 2018-04-22 DIAGNOSIS — S0101XA Laceration without foreign body of scalp, initial encounter: Secondary | ICD-10-CM | POA: Diagnosis not present

## 2018-04-22 DIAGNOSIS — K219 Gastro-esophageal reflux disease without esophagitis: Secondary | ICD-10-CM | POA: Diagnosis not present

## 2018-04-22 DIAGNOSIS — T1490XA Injury, unspecified, initial encounter: Secondary | ICD-10-CM | POA: Diagnosis present

## 2018-04-22 DIAGNOSIS — R2689 Other abnormalities of gait and mobility: Secondary | ICD-10-CM | POA: Insufficient documentation

## 2018-04-22 DIAGNOSIS — M545 Low back pain: Secondary | ICD-10-CM | POA: Diagnosis not present

## 2018-04-22 DIAGNOSIS — S199XXA Unspecified injury of neck, initial encounter: Secondary | ICD-10-CM | POA: Diagnosis not present

## 2018-04-22 LAB — BASIC METABOLIC PANEL
Anion gap: 11 (ref 5–15)
BUN: 33 mg/dL — ABNORMAL HIGH (ref 8–23)
CO2: 18 mmol/L — ABNORMAL LOW (ref 22–32)
Calcium: 9.3 mg/dL (ref 8.9–10.3)
Chloride: 106 mmol/L (ref 98–111)
Creatinine, Ser: 1.71 mg/dL — ABNORMAL HIGH (ref 0.61–1.24)
GFR calc Af Amer: 46 mL/min — ABNORMAL LOW (ref >60)
GFR, EST NON AFRICAN AMERICAN: 40 mL/min — AB (ref >60)
Glucose, Bld: 228 mg/dL — ABNORMAL HIGH (ref 70–99)
Potassium: 3.9 mmol/L (ref 3.5–5.1)
SODIUM: 135 mmol/L (ref 135–145)

## 2018-04-22 LAB — CBC WITH DIFFERENTIAL/PLATELET
Abs Immature Granulocytes: 0.15 10*3/uL — ABNORMAL HIGH (ref 0.00–0.07)
BASOS ABS: 0.1 10*3/uL (ref 0.0–0.1)
Basophils Relative: 0 %
Eosinophils Absolute: 0 10*3/uL (ref 0.0–0.5)
Eosinophils Relative: 0 %
HCT: 41.9 % (ref 39.0–52.0)
Hemoglobin: 13.9 g/dL (ref 13.0–17.0)
Immature Granulocytes: 1 %
LYMPHS PCT: 5 %
Lymphs Abs: 0.8 10*3/uL (ref 0.7–4.0)
MCH: 31.6 pg (ref 26.0–34.0)
MCHC: 33.2 g/dL (ref 30.0–36.0)
MCV: 95.2 fL (ref 80.0–100.0)
Monocytes Absolute: 1.2 10*3/uL — ABNORMAL HIGH (ref 0.1–1.0)
Monocytes Relative: 8 %
Neutro Abs: 13.5 10*3/uL — ABNORMAL HIGH (ref 1.7–7.7)
Neutrophils Relative %: 86 %
Platelets: 217 10*3/uL (ref 150–400)
RBC: 4.4 MIL/uL (ref 4.22–5.81)
RDW: 12.8 % (ref 11.5–15.5)
WBC: 15.8 10*3/uL — AB (ref 4.0–10.5)
nRBC: 0 % (ref 0.0–0.2)

## 2018-04-22 LAB — TROPONIN I

## 2018-04-22 MED ORDER — PROPOFOL 10 MG/ML IV BOLUS
0.5000 mg/kg | Freq: Once | INTRAVENOUS | Status: AC
  Filled 2018-04-22: qty 20

## 2018-04-22 NOTE — ED Notes (Signed)
Patient transported to X-ray 

## 2018-04-22 NOTE — ED Triage Notes (Signed)
Pt reports driving a piece of equipment when he fell and struck L posterior ribs and head; large head lac noted, abrasion noted to L posterior ribs, patient reporting difficulty breathing, rib pain, headache; pt denies LOC, takes baby ASA daily

## 2018-04-22 NOTE — ED Provider Notes (Signed)
[facility] EMERGENCY DEPARTMENT Provider Note   CSN: [id number] Arrival date & time: 04/22/18  2201  History   Chief Complaint Chief Complaint  Patient presents with   Fall   Head Injury    HPI Randall Reeves is a 73 y.o. male with past medical history significant for CKD, GERD, hypertension, hyperlipidemia presents for evaluation of head injury.  Patient states he was on a dingo machine and "fell off and dont know what happened."  Patient denies LOC, however states he does not know how he injured himself. He was able to drive the equipment back home when his wife saw his head and the bleeding she proceeded to drive him to the emergency department.  Patient with pain to his head, neck, left posterior ribs.  Rates his pain an 6/10.  Pain does not radiate.  Patient denies headache, vision changes, slurred speech, or tingling his extremities, decreased range of motion in his extremities, chest pain, shortness of breath, abdominal pain, diarrhea dysuria.  Has not taken anything for symptoms PTA.  He denies anticoagulation.  Tetanus update in 2018.   Last P.O. intake for crackers 1 hour PTA.  History obtained from patient.  No interpreter was used.     HPI  Past Medical History:  Diagnosis Date   Allergy    CKD (chronic kidney disease), stage III ([place])    Coronary atherosclerosis of unspecified type of vessel, native or graft 2011   MI but cath benign. Dr Otis Brace   GERD (gastroesophageal reflux disease)    Hyperlipidemia    Hypertension    Obstructive sleep apnea    Cpap as of 27 days ago    Patient Active Problem List   Diagnosis Date Noted   CKD (chronic kidney disease), stage III ([place]) 12/28/2015   Renal artery aneurysm ([place]) 01/14/2015   Advanced directives, counseling/discussion 01/08/2014   BPH with obstruction/lower urinary tract symptoms 01/08/2014   Splenic artery aneurysm ([place]) 11/25/2012   Erectile dysfunction 06/09/2012   Routine  general medical examination at a health care facility 10/20/2010   CAD (coronary artery disease) 10/18/2009   Hyperlipemia 05/14/2006   Obstructive sleep apnea 05/14/2006   Essential hypertension, benign 05/14/2006   ALLERGIC RHINITIS 05/14/2006   GERD 05/14/2006    Past Surgical History:  Procedure Laterality Date   CARDIAC CATHETERIZATION     CARPAL TUNNEL RELEASE Left 02/2013   INGUINAL HERNIA REPAIR Left 1983   with vasectomy   [place]   with inguinal hernia repair        Home Medications    Prior to Admission medications   Medication Sig Start Date End Date Taking? Authorizing Provider  aspirin EC 81 MG tablet Take 81 mg by mouth daily.    [provider]  atorvastatin (LIPITOR) 10 MG tablet TAKE 1 TABLET BY MOUTH ONCE A DAY 12/16/17   Viviana Simpler I, MD  hydrocortisone 2.5 % cream Apply topically 3 (three) times daily as needed. 09/01/15   Venia Carbon, MD  losartan (COZAAR) 50 MG tablet TAKE 1 TABLET BY MOUTH ONCE A DAY 12/16/17   Venia Carbon, MD  Multiple Vitamin (MULTIVITAMIN) tablet Take 1 tablet by mouth daily.     [provider]  omeprazole (PRILOSEC) 20 MG capsule TAKE 1 CAPSULE BY MOUTH TWICE DAILY 03/20/18   Viviana Simpler I, MD  tadalafil (CIALIS) 5 MG tablet TAKE 1 TABLET BY MOUTH ONCE A DAY 04/20/18  Venia Carbon, MD  triamcinolone cream (KENALOG) 0.1 % Apply 1 application topically 2 (two) times daily as needed. 01/17/17   Venia Carbon, MD    Family History Family History  Problem Relation Age of Onset   Stroke Mother    Hypertension Mother    Diabetes Father    Asthma Father    Heart failure Father    Heart disease Father    Hypertension Sister    Diabetes Sister    Neuropathy Brother    Hypertension Brother    Diabetes Brother    Heart disease Brother    Prostate cancer Brother    Hypertension Brother    Hypertension Brother    Hypertension  Brother     Social History Social History   Tobacco Use   Smoking status: Never Smoker   Smokeless tobacco: Never Used  Substance Use Topics   Alcohol use: Yes    Alcohol/week: 3.0 - 5.0 standard drinks    Types: 3 - 5 Cans of beer per week    Comment: a beer a day maybe   Drug use: No     Allergies   Bisoprolol-hydrochlorothiazide and Codeine   Review of Systems Review of Systems  Constitutional: Negative.   HENT: Negative.   Eyes: Negative.   Respiratory: Negative.   Cardiovascular:       Left posterior rib pain  Gastrointestinal: Negative.   Genitourinary: Negative.   Musculoskeletal: Positive for back pain and neck pain. Negative for neck stiffness.  Skin: Positive for wound.  Neurological: Negative.   All other systems reviewed and are negative.    Physical Exam Updated Vital Signs BP 136/85    Pulse 88    Temp 98.1 F (36.7 C) (Oral)    Resp (!) 21    Ht 5\' 11"  (1.803 m)    Wt 86.2 kg    SpO2 98%    BMI 26.50 kg/m   Physical Exam Vitals signs and nursing note reviewed.  Constitutional:      General: He is not in acute distress.    Appearance: He is well-developed. He is not ill-appearing, toxic-appearing or diaphoretic.  HENT:     Head: Laceration present. No raccoon eyes, Battle's sign, abrasion, contusion, masses, right periorbital erythema or left periorbital erythema. Hair is abnormal.     Jaw: There is normal jaw occlusion.      Comments: Patient with large scalp laceration to parietal region. Bleeding controlled.    Nose: Nose normal.     Right Sinus: No maxillary sinus tenderness or frontal sinus tenderness.     Left Sinus: No maxillary sinus tenderness or frontal sinus tenderness.     Mouth/Throat:     Comments: Oropharynx clear.  Mucous membranes moist.  No abnormal dentition or evidence of intraoral lacerations. Eyes:     Pupils: Pupils are equal, round, and reactive to light.  Neck:     Musculoskeletal: Normal range of motion and  neck supple.     Comments: Midline cervical spinal tenderness as well as tenderness to left trapezius.  Phonation normal.  Trachea normal. Full ROM cervical spine without difficulty. Cardiovascular:     Rate and Rhythm: Normal rate and regular rhythm.     Comments: No murmurs, rubs or gallops.  2+ radial pulses bilaterally, 2+ DP, PT pulses. Pulmonary:     Effort: Pulmonary effort is normal. No respiratory distress.     Comments: Clear to auscultation without wheeze, rhonchi or rales. Chest:  Comments: No chest wall tenderness palpation.  No obvious crepitus.  No flail chest. Abdominal:     General: There is no distension.     Palpations: Abdomen is soft.     Comments: Soft, Nontender without rebound or guarding.  Normoactive bowel sounds.  Musculoskeletal: Normal range of motion.     Right shoulder: Normal.     Left shoulder: He exhibits tenderness. He exhibits normal range of motion, no bony tenderness, no swelling, no effusion, no crepitus, no deformity, no laceration, no pain, no spasm and normal strength.     Right elbow: Normal.    Right hip: Normal.     Left hip: Normal.     Right knee: Normal.     Left knee: Normal.     Cervical back: He exhibits tenderness. He exhibits normal range of motion, no bony tenderness, no swelling, no edema, no deformity, no laceration, no pain, no spasm and normal pulse.     Thoracic back: He exhibits tenderness. He exhibits normal range of motion, no bony tenderness, no swelling, no edema, no deformity, no laceration, no pain, no spasm and normal pulse.     Lumbar back: He exhibits tenderness. He exhibits normal range of motion, no bony tenderness, no swelling, no laceration, no pain and no spasm.     Comments: All range of motion bilateral upper and lower extremities without difficulty.  No bony tenderness to upper or lower extremities.  Mild tenderness to midline lumbar spine.   Skin:    General: Skin is warm and dry.          Comments: Large  abrasion from left scapula down back.  No active bleeding.  Patient with skin tears to the surface of left upper extremity.  No active bleeding. See picture in chart   Neurological:     Mental Status: He is alert.     Comments: Negative finger-to-nose, heel-to-shin.  No facial droop.  No slurred speech.  Nerves II through XII grossly intact.          ED Treatments / Results  Labs (all labs ordered are listed, but only abnormal results are displayed) Labs Reviewed  CBC WITH DIFFERENTIAL/PLATELET - Abnormal; Notable for the following components:      Result Value   WBC 15.8 (*)    Neutro Abs 13.5 (*)    Monocytes Absolute 1.2 (*)    Abs Immature Granulocytes 0.15 (*)    All other components within normal limits  BASIC METABOLIC PANEL - Abnormal; Notable for the following components:   CO2 18 (*)    Glucose, Bld 228 (*)    BUN 33 (*)    Creatinine, Ser 1.71 (*)    GFR calc non Af Amer 40 (*)    GFR calc Af Amer 46 (*)    All other components within normal limits  TROPONIN I    EKG EKG Interpretation  Date/Time:  Saturday April 22 2018 22:16:53 EDT Ventricular Rate:  102 PR Interval:    QRS Duration: 83 QT Interval:  328 QTC Calculation: 428 R Axis:   -9 Text Interpretation:  Sinus tachycardia Abnormal R-wave progression, early transition Borderline T wave abnormalities No significant change since last tracing Confirmed by Isla Pence [phone]) on 04/22/2018 10:20:57 PM   Radiology Dg Ribs Unilateral W/chest Left  Result Date: 04/22/2018 CLINICAL DATA:  Post fall with left rib pain. EXAM: LEFT RIBS AND CHEST - 3+ VIEW COMPARISON:  Chest radiograph and CT 11/23/2012 FINDINGS: Remote left rib  fractures involving ribs 5 through 7. No acute fracture of the ribs. There is no evidence of pneumothorax or pleural effusion. Both lungs are clear. Heart size and mediastinal contours are unchanged from prior exam. IMPRESSION: Remote left rib fractures. No acute fracture or acute  abnormality. Electronically Signed   By: Keith Rake M.D.   On: 04/22/2018 23:15   Dg Thoracic Spine 2 View  Result Date: 04/22/2018 CLINICAL DATA:  Thoracolumbar back pain after fall. EXAM: THORACIC SPINE 2 VIEWS COMPARISON:  None. FINDINGS: Exaggerated thoracic kyphosis. Vertebral body heights are preserved. No evidence of acute fracture. Mild diffuse disc space narrowing and endplate spurring in the midthoracic spine. Posterior elements are grossly intact. No paravertebral soft tissue abnormality to suggest acute injury. IMPRESSION: Exaggerated thoracic kyphosis without evidence of acute fracture. Electronically Signed   By: Keith Rake M.D.   On: 04/22/2018 23:17   Dg Lumbar Spine Complete  Result Date: 04/22/2018 CLINICAL DATA:  Thoracolumbar back pain after fall. EXAM: LUMBAR SPINE - COMPLETE 4+ VIEW COMPARISON:  None. FINDINGS: The alignment is maintained. Vertebral body heights are normal. There is no listhesis. The posterior elements are intact. Disc space narrowing at L4-L5 and to a lesser extent L3-L4. Mild facet hypertrophy in the lower lumbar spine. No fracture. Sacroiliac joints are symmetric and normal. IMPRESSION: No fracture or subluxation of the lumbar spine. Degenerative disc disease. Electronically Signed   By: Keith Rake M.D.   On: 04/22/2018 23:18   Ct Head Wo Contrast  Result Date: 04/22/2018 CLINICAL DATA:  Fall, head injury EXAM: CT HEAD WITHOUT CONTRAST CT CERVICAL SPINE WITHOUT CONTRAST TECHNIQUE: Multidetector CT imaging of the head and cervical spine was performed following the standard protocol without intravenous contrast. Multiplanar CT image reconstructions of the cervical spine were also generated. COMPARISON:  12/28/2015 FINDINGS: CT HEAD FINDINGS Brain: No evidence of acute infarction, hemorrhage, hydrocephalus, extra-axial collection or mass lesion/mass effect. Extensive periventricular and deep white matter hypodensity. Vascular: No hyperdense vessel  or unexpected calcification. Skull: Normal. Negative for fracture or focal lesion. Sinuses/Orbits: No acute finding. Other: Large laceration of the left scalp vertex. CT CERVICAL SPINE FINDINGS Alignment: Normal. Skull base and vertebrae: No acute fracture. No primary bone lesion or focal pathologic process. Soft tissues and spinal canal: No prevertebral fluid or swelling. No visible canal hematoma. Disc levels: Moderate multilevel disc space height loss and osteophytosis. Upper chest: Negative. Other: None. IMPRESSION: 1. No acute intracranial pathology. Advanced small-vessel white matter disease. 2.  Large laceration of the left scalp vertex. 3.  No fracture or static subluxation of the cervical spine. Electronically Signed   By: Eddie Candle M.D.   On: 04/22/2018 22:56   Ct Chest Wo Contrast  Result Date: 04/23/2018 CLINICAL DATA:  Left rib pain after injury. EXAM: CT CHEST WITHOUT CONTRAST TECHNIQUE: Multidetector CT imaging of the chest was performed following the standard protocol without IV contrast. COMPARISON:  Radiographs earlier this day. FINDINGS: Cardiovascular: Aortic atherosclerosis and tortuosity. No periaortic stranding. Normal heart size. No pericardial effusion. Coronary artery calcifications. Mediastinum/Nodes: No mediastinal hematoma. No mediastinal adenopathy. The esophagus is decompressed. No visualized thyroid nodule. Lungs/Pleura: No pneumothorax. No evidence of pulmonary contusion. Dependent lower lobe opacities consistent with atelectasis. Minimal left pleural thickening without pleural effusion. No pulmonary edema. Trachea and mainstem bronchi are patent. Upper Abdomen: Nonobstructing right renal stone versus vascular calcification. No free fluid. No acute finding. Musculoskeletal: Acute nondisplaced posterior seventh, eighth, ninth rib fractures. Additional acute fracture of the seventh and eighth ribs  at the costovertebral junction. Acute nondisplaced left sixth and seventh  transverse process fractures. No vertebral body or middle column extension. Remote fractures of the sternum and anterior left ribs. IMPRESSION: 1. Multiple acute left rib fractures including posterior seventh, eighth, and ninth ribs. The seventh and eighth ribs are segmental and also fractured at the costovertebral junction. This approaches but does not strictly meet criteria for flail chest. 2. Acute left sixth and seventh transverse process fractures. 3. No pneumothorax. Mild left pleural thickening without effusion. Dependent atelectasis in both lungs. 4. Coronary artery calcifications. Aortic Atherosclerosis (ICD10-I70.0). Electronically Signed   By: Keith Rake M.D.   On: 04/23/2018 00:04   Ct Cervical Spine Wo Contrast  Result Date: 04/22/2018 CLINICAL DATA:  Fall, head injury EXAM: CT HEAD WITHOUT CONTRAST CT CERVICAL SPINE WITHOUT CONTRAST TECHNIQUE: Multidetector CT imaging of the head and cervical spine was performed following the standard protocol without intravenous contrast. Multiplanar CT image reconstructions of the cervical spine were also generated. COMPARISON:  12/28/2015 FINDINGS: CT HEAD FINDINGS Brain: No evidence of acute infarction, hemorrhage, hydrocephalus, extra-axial collection or mass lesion/mass effect. Extensive periventricular and deep white matter hypodensity. Vascular: No hyperdense vessel or unexpected calcification. Skull: Normal. Negative for fracture or focal lesion. Sinuses/Orbits: No acute finding. Other: Large laceration of the left scalp vertex. CT CERVICAL SPINE FINDINGS Alignment: Normal. Skull base and vertebrae: No acute fracture. No primary bone lesion or focal pathologic process. Soft tissues and spinal canal: No prevertebral fluid or swelling. No visible canal hematoma. Disc levels: Moderate multilevel disc space height loss and osteophytosis. Upper chest: Negative. Other: None. IMPRESSION: 1. No acute intracranial pathology. Advanced small-vessel white matter  disease. 2.  Large laceration of the left scalp vertex. 3.  No fracture or static subluxation of the cervical spine. Electronically Signed   By: Eddie Candle M.D.   On: 04/22/2018 22:56    Procedures .Marland KitchenLaceration Repair Date/Time: 04/23/2018 12:31 AM Performed by: Nettie Elm, PA-C Authorized by: Nettie Elm, PA-C   Consent:    Consent obtained:  Verbal   Consent given by:  Patient   Risks discussed:  Infection, need for additional repair, pain, poor cosmetic result and poor wound healing   Alternatives discussed:  No treatment and delayed treatment Universal protocol:    Procedure explained and questions answered to patient or proxy's satisfaction: yes     Relevant documents present and verified: yes     Test results available and properly labeled: yes     Imaging studies available: yes     Required blood products, implants, devices, and special equipment available: yes     Site/side marked: yes     Immediately prior to procedure, a time out was called: yes     Patient identity confirmed:  Verbally with patient Anesthesia (see MAR for exact dosages):    Anesthesia method:  Local infiltration Repair type:    Repair type:  Complex Pre-procedure details:    Preparation:  Patient was prepped and draped in usual sterile fashion and imaging obtained to evaluate for foreign bodies Exploration:    Limited defect created (wound extended): no     Hemostasis achieved with:  Direct pressure   Wound exploration: wound explored through full range of motion and entire depth of wound probed and visualized     Contaminated: no   Treatment:    Area cleansed with:  Betadine   Amount of cleaning:  Extensive   Irrigation solution:  Sterile saline   Irrigation volume:  2000L   Irrigation method:  Pressure wash   Debridement:  Minimal Skin repair:    Repair method:  Staples   Number of staples:  26 Approximation:    Approximation:  Close Post-procedure details:    Dressing:   Bulky dressing   Patient tolerance of procedure:  Tolerated well, no immediate complications   (including critical care time)  Medications Ordered in ED Medications  propofol (DIPRIVAN) 10 mg/mL bolus/IV push 43.1 mg (has no administration in time range)  propofol (DIPRIVAN) 10 mg/mL bolus/IV push (10 mg Intravenous Given 04/23/18 0019)   Initial Impression / Assessment and Plan / ED Course  I have reviewed the triage vital signs and the nursing notes.  Pertinent labs & imaging results that were available during my care of the patient were reviewed by me and considered in my medical decision making (see chart for details).  73 year old male presents for evaluation of head injury.  Denies any coagulation.  Patient denies LOC, however does not know how he sustained the injury.  Patient was able to drive the tractor that he was on after injury.  Denies equipment rolling over patient.  Patient with large deep laceration to parietal region of scalp as well as skin tear to dorsal side of left upper extremity.  Normal musculoskeletal exam, however pain to paraspinal cervical, thoracic and lumbar region. Patient also with significant pain to posterior left chest, suspicion for rib fracture. No flail chest, obvious crepitus or deformity at this time.  Lungs clear.  No facial droop, decreased range of motion extremities.  Negative finger-nose, heel-to-shin. Abdomen soft without rebound or guarding.  No chest wall pain.  Will obtain EKG, imaging, basic labs and reevaluate. Tetanus Up to date.  Refuses to remove pants for extensive evaluation of his lower extremities.  Discussed risk versus benefit.  Patient voiced understanding risk versus benefit however states "I dont have anything wrong down there."  Extensive of head laceration closed with 26 staples. Bleeding controlled with pressure and combat gauze. Patient with conscious sedation for closure preformed by attending physician, Dr. Gilford Raid. See her note.   Patient skin abrasions cleaned and bandaged.  Denies bony tenderness.  CT chest with multiple acute left rib fractures including posterior seventh, eighth, and ninth ribs. The seventh and eighth ribs are segmental and also fractured at the costovertebral junction. This approaches but does not strictly meet criteria for flail chest. Patient will need admission for rib fractures.  No evidence of hemothorax or pneumothorax.  Patient without tachypnea, tachycardia or hypoxia.  CBC with mild leukocytosis at 30.8, metabolic panel with creatinine 1.71, hyperglycemia, no additional electrolyte, renal or liver normality, troponin negative.  EKG without evidence of ST, T changes.  Plain film thoracic and lumbar spine negative for acute fractures. No tenderness over pelvis, moves bilateral lower extremities without difficulty. No shortening or rotation of legs.  0030: Consulted with Dr. Donne Hazel, trauma service who agrees to evaluate patient for admission.  Patient does not want anything for pain at this time.  Discussed plan with patient as well as wife via telephone with patient's permission.  They are both in agreement with plan.     Patient has been seen and evaluated by my attending Dr.Haviland who agrees with above treatment, plan and disposition. Final Clinical Impressions(s) / ED Diagnoses   Final diagnoses:  Fall, initial encounter  Laceration of scalp, initial encounter  Closed fracture of multiple ribs of left side, initial encounter    ED Discharge Orders  None       Hoang Pettingill A, PA-C 04/23/18 0106    Isla Pence, MD 04/23/18 1624

## 2018-04-23 ENCOUNTER — Inpatient Hospital Stay (HOSPITAL_COMMUNITY): Payer: PPO

## 2018-04-23 ENCOUNTER — Other Ambulatory Visit: Payer: Self-pay | Admitting: Surgery

## 2018-04-23 ENCOUNTER — Emergency Department (HOSPITAL_COMMUNITY): Payer: PPO

## 2018-04-23 DIAGNOSIS — T1490XA Injury, unspecified, initial encounter: Secondary | ICD-10-CM | POA: Diagnosis present

## 2018-04-23 DIAGNOSIS — S2242XA Multiple fractures of ribs, left side, initial encounter for closed fracture: Secondary | ICD-10-CM | POA: Diagnosis not present

## 2018-04-23 DIAGNOSIS — S3993XA Unspecified injury of pelvis, initial encounter: Secondary | ICD-10-CM | POA: Diagnosis not present

## 2018-04-23 DIAGNOSIS — J9811 Atelectasis: Secondary | ICD-10-CM | POA: Diagnosis not present

## 2018-04-23 DIAGNOSIS — S2239XA Fracture of one rib, unspecified side, initial encounter for closed fracture: Secondary | ICD-10-CM | POA: Diagnosis present

## 2018-04-23 LAB — GLUCOSE, CAPILLARY
Glucose-Capillary: 106 mg/dL — ABNORMAL HIGH (ref 70–99)
Glucose-Capillary: 165 mg/dL — ABNORMAL HIGH (ref 70–99)

## 2018-04-23 LAB — BASIC METABOLIC PANEL
Anion gap: 10 (ref 5–15)
BUN: 27 mg/dL — ABNORMAL HIGH (ref 8–23)
CO2: 20 mmol/L — ABNORMAL LOW (ref 22–32)
Calcium: 8.3 mg/dL — ABNORMAL LOW (ref 8.9–10.3)
Chloride: 107 mmol/L (ref 98–111)
Creatinine, Ser: 1.32 mg/dL — ABNORMAL HIGH (ref 0.61–1.24)
GFR calc Af Amer: >60 mL/min (ref >60)
GFR calc non Af Amer: 54 mL/min — ABNORMAL LOW (ref >60)
Glucose, Bld: 116 mg/dL — ABNORMAL HIGH (ref 70–99)
POTASSIUM: 3.6 mmol/L (ref 3.5–5.1)
SODIUM: 137 mmol/L (ref 135–145)

## 2018-04-23 LAB — MRSA PCR SCREENING: MRSA BY PCR: NEGATIVE

## 2018-04-23 LAB — CBC
HCT: 37.9 % — ABNORMAL LOW (ref 39.0–52.0)
Hemoglobin: 12.4 g/dL — ABNORMAL LOW (ref 13.0–17.0)
MCH: 31 pg (ref 26.0–34.0)
MCHC: 32.7 g/dL (ref 30.0–36.0)
MCV: 94.8 fL (ref 80.0–100.0)
Platelets: 176 10*3/uL (ref 150–400)
RBC: 4 MIL/uL — ABNORMAL LOW (ref 4.22–5.81)
RDW: 12.9 % (ref 11.5–15.5)
WBC: 8.2 10*3/uL (ref 4.0–10.5)
nRBC: 0 % (ref 0.0–0.2)

## 2018-04-23 MED ORDER — MORPHINE SULFATE (PF) 2 MG/ML IV SOLN: 2.0000 mg | INTRAVENOUS | Status: DC | PRN

## 2018-04-23 MED ORDER — ONDANSETRON HCL 4 MG/2ML IJ SOLN: 4.0000 mg | Freq: Four times a day (QID) | INTRAMUSCULAR | Status: DC | PRN

## 2018-04-23 MED ORDER — HYDRALAZINE HCL 20 MG/ML IJ SOLN: 10.0000 mg | INTRAMUSCULAR | Status: DC | PRN

## 2018-04-23 MED ORDER — ENOXAPARIN SODIUM 40 MG/0.4ML ~~LOC~~ SOLN: 40.0000 mg | SUBCUTANEOUS | Status: DC

## 2018-04-23 MED ORDER — PROPOFOL 10 MG/ML IV BOLUS
INTRAVENOUS | Status: AC | PRN
  Administered 2018-04-23: 20 mg via INTRAVENOUS
  Administered 2018-04-23: 10 mg via INTRAVENOUS

## 2018-04-23 MED ORDER — PANTOPRAZOLE SODIUM 40 MG PO TBEC
40.0000 mg | DELAYED_RELEASE_TABLET | Freq: Every day | ORAL | Status: DC
  Administered 2018-04-23: 40 mg via ORAL
  Filled 2018-04-23: qty 1

## 2018-04-23 MED ORDER — OXYCODONE HCL 5 MG PO TABS: 5.0000 mg | ORAL_TABLET | ORAL | 0 refills | Status: DC | PRN

## 2018-04-23 MED ORDER — ATORVASTATIN CALCIUM 10 MG PO TABS
10.0000 mg | ORAL_TABLET | Freq: Every day | ORAL | Status: DC
  Administered 2018-04-23: 10 mg via ORAL
  Filled 2018-04-23: qty 1

## 2018-04-23 MED ORDER — OXYCODONE HCL 5 MG PO TABS: 10.0000 mg | ORAL_TABLET | ORAL | Status: DC | PRN

## 2018-04-23 MED ORDER — METHOCARBAMOL 500 MG PO TABS
500.0000 mg | ORAL_TABLET | Freq: Three times a day (TID) | ORAL | Status: DC
  Administered 2018-04-23: 500 mg via ORAL
  Filled 2018-04-23: qty 1

## 2018-04-23 MED ORDER — SODIUM CHLORIDE 0.9 % IV SOLN
INTRAVENOUS | Status: DC
  Administered 2018-04-23: 05:00:00 via INTRAVENOUS

## 2018-04-23 MED ORDER — ONDANSETRON 4 MG PO TBDP: 4.0000 mg | ORAL_TABLET | Freq: Four times a day (QID) | ORAL | Status: DC | PRN

## 2018-04-23 MED ORDER — ACETAMINOPHEN 500 MG PO TABS
1000.0000 mg | ORAL_TABLET | Freq: Four times a day (QID) | ORAL | Status: DC
  Administered 2018-04-23: 1000 mg via ORAL
  Filled 2018-04-23: qty 2

## 2018-04-23 MED ORDER — OXYCODONE HCL 5 MG PO TABS
5.0000 mg | ORAL_TABLET | ORAL | Status: DC | PRN
  Administered 2018-04-23: 5 mg via ORAL
  Filled 2018-04-23: qty 1

## 2018-04-23 MED ORDER — PANTOPRAZOLE SODIUM 40 MG IV SOLR: 40.0000 mg | Freq: Every day | INTRAVENOUS | Status: DC

## 2018-04-23 NOTE — ED Notes (Signed)
ED TO INPATIENT HANDOFF REPORT  ED Nurse Name and Phone #:  Sharrie Rothman [phone]  S Name/Age/Gender Randall Reeves 72 y.o. male Room/Bed: 035C/035C  Code Status   Code Status: Prior  Home/SNF/Other Home Patient oriented to: self Is this baseline? Yes   Triage Complete: Triage complete  Chief Complaint FALL/HEAD INJURY  Triage Note Pt reports driving a piece of equipment when he fell and struck L posterior ribs and head; large head lac noted, abrasion noted to L posterior ribs, patient reporting difficulty breathing, rib pain, headache; pt denies LOC, takes baby ASA daily   Allergies Allergies  Allergen Reactions  . Bisoprolol-Hydrochlorothiazide     REACTION: muscle pain, cramps  . Codeine Nausea Only    Level of Care/Admitting Diagnosis ED Disposition    ED Disposition Condition [facility] Area: [place] [[id number]]  Level of Care: Telemetry Surgical [105]  Diagnosis: Rib fracture [250539]  Admitting Physician: TRAUMA MD [[id number]]  Attending Physician: TRAUMA MD [[id number]]  Estimated length of stay: 3 - 4 days  Certification:: I certify this patient will need inpatient services for at least 2 midnights  PT Class (Do Not Modify): Inpatient [101]  PT Acc Code (Do Not Modify): Private [1]       B Medical/Surgery History Past Medical History:  Diagnosis Date  . Allergy   . CKD (chronic kidney disease), stage III ([place])   . Coronary atherosclerosis of unspecified type of vessel, native or graft 2011   MI but cath benign. Dr Otis Brace  . GERD (gastroesophageal reflux disease)   . Hyperlipidemia   . Hypertension   . Obstructive sleep apnea    Cpap as of 27 days ago   Past Surgical History:  Procedure Laterality Date  . CARDIAC CATHETERIZATION    . CARPAL TUNNEL RELEASE Left 02/2013  . INGUINAL HERNIA REPAIR Left 1983   with vasectomy  . SPERMATOCELECTOMY  1998  . VASECTOMY  1983   with inguinal hernia repair     A IV  Location/Drains/Wounds Patient Lines/Drains/Airways Status   Active Line/Drains/Airways    Name:   Placement date:   Placement time:   Site:   Days:   Peripheral IV 04/22/18 Right Hand   04/22/18    2215    Hand   1          Intake/Output Last 24 hours No intake or output data in the 24 hours ending 04/23/18 0341  Labs/Imaging Results for orders placed or performed during the hospital encounter of 04/22/18 (from the past 48 hour(s))  CBC with Differential     Status: Abnormal   Collection Time: 04/22/18 10:17 PM  Result Value Ref Range   WBC 15.8 (H) 4.0 - 10.5 K/uL   RBC 4.40 4.22 - 5.81 MIL/uL   Hemoglobin 13.9 13.0 - 17.0 g/dL   HCT 41.9 39.0 - 52.0 %   MCV 95.2 80.0 - 100.0 fL   MCH 31.6 26.0 - 34.0 pg   MCHC 33.2 30.0 - 36.0 g/dL   RDW 12.8 11.5 - 15.5 %   Platelets 217 150 - 400 K/uL   nRBC 0.0 0.0 - 0.2 %   Neutrophils Relative % 86 %   Neutro Abs 13.5 (H) 1.7 - 7.7 K/uL   Lymphocytes Relative 5 %   Lymphs Abs 0.8 0.7 - 4.0 K/uL   Monocytes Relative 8 %   Monocytes Absolute 1.2 (H) 0.1 - 1.0 K/uL   Eosinophils Relative 0 %  Eosinophils Absolute 0.0 0.0 - 0.5 K/uL   Basophils Relative 0 %   Basophils Absolute 0.1 0.0 - 0.1 K/uL   Immature Granulocytes 1 %   Abs Immature Granulocytes 0.15 (H) 0.00 - 0.07 K/uL    Comment: Performed at [facility] [address], [place], [place] [address]  Troponin I - ONCE - STAT     Status: None   Collection Time: 04/22/18 10:17 PM  Result Value Ref Range   Troponin I <0.03 <0.03 ng/mL    Comment: Performed at [facility], [place] [address] [place], [place] [address]  Basic metabolic panel     Status: Abnormal   Collection Time: 04/22/18 10:17 PM  Result Value Ref Range   Sodium 135 135 - 145 mmol/L   Potassium 3.9 3.5 - 5.1 mmol/L   Chloride 106 98 - 111 mmol/L   CO2 18 (L) 22 - 32 mmol/L   Glucose, Bld 228 (H) 70 - 99 mg/dL   BUN 33 (H) 8 - 23 mg/dL   Creatinine, Ser 1.71 (H) 0.61 - 1.24 mg/dL   Calcium  9.3 8.9 - 10.3 mg/dL   GFR calc non Af Amer 40 (L) >60 mL/min   GFR calc Af Amer 46 (L) >60 mL/min   Anion gap 11 5 - 15    Comment: Performed at [facility] [address] [place], [place] [address]   Dg Ribs Unilateral W/chest Left  Result Date: 04/22/2018 CLINICAL DATA:  Post fall with left rib pain. EXAM: LEFT RIBS AND CHEST - 3+ VIEW COMPARISON:  Chest radiograph and CT 11/23/2012 FINDINGS: Remote left rib fractures involving ribs 5 through 7. No acute fracture of the ribs. There is no evidence of pneumothorax or pleural effusion. Both lungs are clear. Heart size and mediastinal contours are unchanged from prior exam. IMPRESSION: Remote left rib fractures. No acute fracture or acute abnormality. Electronically Signed   By: Keith Rake M.D.   On: 04/22/2018 23:15   Dg Thoracic Spine 2 View  Result Date: 04/22/2018 CLINICAL DATA:  Thoracolumbar back pain after fall. EXAM: THORACIC SPINE 2 VIEWS COMPARISON:  None. FINDINGS: Exaggerated thoracic kyphosis. Vertebral body heights are preserved. No evidence of acute fracture. Mild diffuse disc space narrowing and endplate spurring in the midthoracic spine. Posterior elements are grossly intact. No paravertebral soft tissue abnormality to suggest acute injury. IMPRESSION: Exaggerated thoracic kyphosis without evidence of acute fracture. Electronically Signed   By: Keith Rake M.D.   On: 04/22/2018 23:17   Dg Lumbar Spine Complete  Result Date: 04/22/2018 CLINICAL DATA:  Thoracolumbar back pain after fall. EXAM: LUMBAR SPINE - COMPLETE 4+ VIEW COMPARISON:  None. FINDINGS: The alignment is maintained. Vertebral body heights are normal. There is no listhesis. The posterior elements are intact. Disc space narrowing at L4-L5 and to a lesser extent L3-L4. Mild facet hypertrophy in the lower lumbar spine. No fracture. Sacroiliac joints are symmetric and normal. IMPRESSION: No fracture or subluxation of the lumbar spine. Degenerative disc  disease. Electronically Signed   By: Keith Rake M.D.   On: 04/22/2018 23:18   Ct Head Wo Contrast  Result Date: 04/22/2018 CLINICAL DATA:  Fall, head injury EXAM: CT HEAD WITHOUT CONTRAST CT CERVICAL SPINE WITHOUT CONTRAST TECHNIQUE: Multidetector CT imaging of the head and cervical spine was performed following the standard protocol without intravenous contrast. Multiplanar CT image reconstructions of the cervical spine were also generated. COMPARISON:  12/28/2015 FINDINGS: CT HEAD FINDINGS Brain: No evidence  of acute infarction, hemorrhage, hydrocephalus, extra-axial collection or mass lesion/mass effect. Extensive periventricular and deep white matter hypodensity. Vascular: No hyperdense vessel or unexpected calcification. Skull: Normal. Negative for fracture or focal lesion. Sinuses/Orbits: No acute finding. Other: Large laceration of the left scalp vertex. CT CERVICAL SPINE FINDINGS Alignment: Normal. Skull base and vertebrae: No acute fracture. No primary bone lesion or focal pathologic process. Soft tissues and spinal canal: No prevertebral fluid or swelling. No visible canal hematoma. Disc levels: Moderate multilevel disc space height loss and osteophytosis. Upper chest: Negative. Other: None. IMPRESSION: 1. No acute intracranial pathology. Advanced small-vessel white matter disease. 2.  Large laceration of the left scalp vertex. 3.  No fracture or static subluxation of the cervical spine. Electronically Signed   By: Eddie Candle M.D.   On: 04/22/2018 22:56   Ct Chest Wo Contrast  Result Date: 04/23/2018 CLINICAL DATA:  Left rib pain after injury. EXAM: CT CHEST WITHOUT CONTRAST TECHNIQUE: Multidetector CT imaging of the chest was performed following the standard protocol without IV contrast. COMPARISON:  Radiographs earlier this day. FINDINGS: Cardiovascular: Aortic atherosclerosis and tortuosity. No periaortic stranding. Normal heart size. No pericardial effusion. Coronary artery  calcifications. Mediastinum/Nodes: No mediastinal hematoma. No mediastinal adenopathy. The esophagus is decompressed. No visualized thyroid nodule. Lungs/Pleura: No pneumothorax. No evidence of pulmonary contusion. Dependent lower lobe opacities consistent with atelectasis. Minimal left pleural thickening without pleural effusion. No pulmonary edema. Trachea and mainstem bronchi are patent. Upper Abdomen: Nonobstructing right renal stone versus vascular calcification. No free fluid. No acute finding. Musculoskeletal: Acute nondisplaced posterior seventh, eighth, ninth rib fractures. Additional acute fracture of the seventh and eighth ribs at the costovertebral junction. Acute nondisplaced left sixth and seventh transverse process fractures. No vertebral body or middle column extension. Remote fractures of the sternum and anterior left ribs. IMPRESSION: 1. Multiple acute left rib fractures including posterior seventh, eighth, and ninth ribs. The seventh and eighth ribs are segmental and also fractured at the costovertebral junction. This approaches but does not strictly meet criteria for flail chest. 2. Acute left sixth and seventh transverse process fractures. 3. No pneumothorax. Mild left pleural thickening without effusion. Dependent atelectasis in both lungs. 4. Coronary artery calcifications. Aortic Atherosclerosis (ICD10-I70.0). Electronically Signed   By: Keith Rake M.D.   On: 04/23/2018 00:04   Ct Cervical Spine Wo Contrast  Result Date: 04/22/2018 CLINICAL DATA:  Fall, head injury EXAM: CT HEAD WITHOUT CONTRAST CT CERVICAL SPINE WITHOUT CONTRAST TECHNIQUE: Multidetector CT imaging of the head and cervical spine was performed following the standard protocol without intravenous contrast. Multiplanar CT image reconstructions of the cervical spine were also generated. COMPARISON:  12/28/2015 FINDINGS: CT HEAD FINDINGS Brain: No evidence of acute infarction, hemorrhage, hydrocephalus, extra-axial  collection or mass lesion/mass effect. Extensive periventricular and deep white matter hypodensity. Vascular: No hyperdense vessel or unexpected calcification. Skull: Normal. Negative for fracture or focal lesion. Sinuses/Orbits: No acute finding. Other: Large laceration of the left scalp vertex. CT CERVICAL SPINE FINDINGS Alignment: Normal. Skull base and vertebrae: No acute fracture. No primary bone lesion or focal pathologic process. Soft tissues and spinal canal: No prevertebral fluid or swelling. No visible canal hematoma. Disc levels: Moderate multilevel disc space height loss and osteophytosis. Upper chest: Negative. Other: None. IMPRESSION: 1. No acute intracranial pathology. Advanced small-vessel white matter disease. 2.  Large laceration of the left scalp vertex. 3.  No fracture or static subluxation of the cervical spine. Electronically Signed   By: Dorna Bloom.D.  On: 04/22/2018 22:56   Dg Pelvis Portable  Result Date: 04/23/2018 CLINICAL DATA:  Trauma. EXAM: PORTABLE PELVIS 1-2 VIEWS COMPARISON:  None. FINDINGS: The cortical margins of the bony pelvis are intact. No fracture. Pubic symphysis and sacroiliac joints are congruent. Both femoral heads are well-seated in the respective acetabula. IMPRESSION: No pelvic fracture. Electronically Signed   By: Keith Rake M.D.   On: 04/23/2018 01:35    Pending Labs [employer] (From admission, onward)    Start     Ordered   Signed and Held  Creatinine, serum  (enoxaparin (LOVENOX)    CrCl >/= 30 ml/min)  Weekly,   R    Comments:  while on enoxaparin therapy    Signed and Held   Signed and Held  CBC  Tomorrow morning,   R     Signed and Held   Signed and Held  Basic metabolic panel  Tomorrow morning,   R     Signed and Held          Vitals/Pain Today's Vitals   04/23/18 0145 04/23/18 0230 04/23/18 0300 04/23/18 0330  BP: 134/81 (!) 138/92 133/90 130/88  Pulse: 92 87 88 86  Resp: 16 12 16 16   Temp:      TempSrc:      SpO2:  95% 96% 95% 96%  Weight:      Height:      PainSc:        Isolation Precautions No active isolations  Medications Medications  propofol (DIPRIVAN) 10 mg/mL bolus/IV push 43.1 mg (has no administration in time range)  atorvastatin (LIPITOR) tablet 10 mg (has no administration in time range)  propofol (DIPRIVAN) 10 mg/mL bolus/IV push (10 mg Intravenous Given 04/23/18 0019)    Mobility walks Low fall risk   Focused Assessments Neuro Assessment Handoff:  Swallow screen pass? N/A Cardiac Rhythm: Normal sinus rhythm       Neuro Assessment: Exceptions to WDL(truama to head) Neuro Checks:      Last Documented NIHSS Modified Score:   Has TPA been given? Yes Temp: 98.1 F (36.7 C) (03/28 2211) Temp Source: Oral (03/28 2211) BP: 130/88 (03/29 0330) Pulse Rate: 86 (03/29 0330) If patient is a Neuro Trauma and patient is going to OR before floor call report to [facility] nurse: [phone] or [phone]     R Recommendations: See Admitting Provider Note  Report given to:   Additional Notes: Pt is A&O x 4.  C/o rib pain w/ movement.

## 2018-04-23 NOTE — Care Management CC44 (Signed)
Condition Code 44 Documentation Completed  Patient Details  Name: Clemons Salvucci Podolsky MRN: [id number] Date of Birth: 1945-07-18   Condition Code 44 given:  Yes Patient signature on Condition Code 44 notice:  Yes Documentation of 2 MD's agreement:  Yes Code 44 added to claim:  Yes    Claudie Leach, RN 04/23/2018, 11:13 AM

## 2018-04-23 NOTE — Progress Notes (Signed)
IV removed, discharge instructions reviewed with patient.  Patient will be transported via wheelchair to home with wife.

## 2018-04-23 NOTE — ED Provider Notes (Signed)
  Physical Exam  BP 128/89 (BP Location: Right Arm)   Pulse 87   Temp 98.4 F (36.9 C)   Resp 20   Ht 5\' 11"  (1.803 m)   Wt 85 kg   SpO2 96%   BMI 26.14 kg/m   Physical Exam  ED Course/Procedures     .Sedation Date/Time: 04/23/2018 4:20 PM Performed by: Isla Pence, MD Authorized by: Isla Pence, MD   Consent:    Consent obtained:  Verbal   Consent given by:  Patient   Risks discussed:  Allergic reaction, dysrhythmia, inadequate sedation, nausea, prolonged hypoxia resulting in organ damage, prolonged sedation necessitating reversal, respiratory compromise necessitating ventilatory assistance and intubation and vomiting   Alternatives discussed:  Analgesia without sedation, anxiolysis and regional anesthesia Universal protocol:    Procedure explained and questions answered to patient or proxy's satisfaction: yes     Relevant documents present and verified: yes     Test results available and properly labeled: yes     Imaging studies available: yes     Required blood products, implants, devices, and special equipment available: yes     Site/side marked: yes     Immediately prior to procedure a time out was called: yes     Patient identity confirmation method:  Verbally with patient Indications:    Procedure necessitating sedation performed by:  Different physician Pre-sedation assessment:    Time since last food or drink:  5   ASA classification: class 2 - patient with mild systemic disease     Neck mobility: normal     Mouth opening:  3 or more finger widths   Thyromental distance:  4 finger widths   Mallampati score:  I - soft palate, uvula, fauces, pillars visible   Pre-sedation assessments completed and reviewed: airway patency, cardiovascular function, hydration status, mental status, nausea/vomiting, pain level, respiratory function and temperature   Immediate pre-procedure details:    Reassessment: Patient reassessed immediately prior to procedure    Reviewed: vital signs, relevant labs/tests and NPO status     Verified: bag valve mask available, emergency equipment available, intubation equipment available, IV patency confirmed, oxygen available and suction available   Procedure details (see MAR for exact dosages):    Preoxygenation:  Nasal cannula   Sedation:  Propofol   Intra-procedure monitoring:  Blood pressure monitoring, cardiac monitor, continuous pulse oximetry, frequent LOC assessments, frequent vital sign checks and continuous capnometry   Intra-procedure events: none     Total Provider sedation time (minutes):  30 Post-procedure details:    Attendance: Constant attendance by certified staff until patient recovered     Recovery: Patient returned to pre-procedure baseline     Post-sedation assessments completed and reviewed: airway patency, cardiovascular function, hydration status, mental status, nausea/vomiting, pain level, respiratory function and temperature     Patient is stable for discharge or admission: yes     Patient tolerance:  Tolerated well, no immediate complications    MDM         Isla Pence, MD 04/23/18 1620

## 2018-04-23 NOTE — Discharge Summary (Signed)
Physician Discharge Summary  Patient ID: Randall Reeves MRN: [id number] DOB/AGE: 71-Jan-1947 71 y.o.  Admit date: 04/22/2018 Discharge date: 04/23/2018  Admission Diagnoses: Fall  Scalp laceration  Left multiple rib fractures  Discharge Diagnoses: same Active Problems:   Rib fracture   Discharged Condition: good  Hospital Course: Patient did well.  He was able to ambulate with good pain control with physical therapy.  He had no restrictions to his movement and had a good productive cough.  His pain medication was oral Tylenol and oxycodone and this managed his pain well.  He tolerated his diet and was discharged home.  Wound instructions given and follow-up instructions the trauma clinic were given.  Consults: None   Treatments: IV hydration  Discharge Exam: Blood pressure 128/89, pulse 87, temperature 98.4 F (36.9 C), resp. rate 20, height 5\' 11"  (1.803 m), weight 85 kg, SpO2 96 %. General appearance: alert and cooperative Head: scalp laceration closed with staples  Eyes: negative Ears: NORMAL  Nose: Nares normal. Septum midline. Mucosa normal. No drainage or sinus tenderness. Back: LEFT SIDE TENDERNESS NOTED  Resp: clear to auscultation bilaterally Cardio: regular rate and rhythm, S1, S2 normal, no murmur, click, rub or gallop GI: soft, non-tender; bowel sounds normal; no masses,  no organomegaly Pelvic: STABLE NO PAIN  Extremities: extremities normal, atraumatic, no cyanosis or edema Pulses: 2+ and symmetric Neurologic: Grossly normal  Disposition: Discharge disposition: 01-Home or Self Care       Discharge Instructions    Diet - low sodium heart healthy   Complete by:  As directed    Increase activity slowly   Complete by:  As directed      Allergies as of 04/23/2018      Reactions   Bisoprolol-hydrochlorothiazide    REACTION: muscle pain, cramps   Codeine Nausea Only      Medication List    TAKE these medications   aspirin EC 81 MG tablet Take 81 mg  by mouth daily.   atorvastatin 10 MG tablet Commonly known as:  LIPITOR TAKE 1 TABLET BY MOUTH ONCE A DAY   hydrocortisone 2.5 % cream Apply topically 3 (three) times daily as needed. What changed:    how much to take  reasons to take this   losartan 50 MG tablet Commonly known as:  COZAAR TAKE 1 TABLET BY MOUTH ONCE A DAY   LUTEIN PO Take 1 tablet by mouth every evening.   multivitamin tablet Take 1 tablet by mouth daily.   omeprazole 20 MG capsule Commonly known as:  PRILOSEC TAKE 1 CAPSULE BY MOUTH TWICE DAILY What changed:  when to take this   oxyCODONE 5 MG immediate release tablet Commonly known as:  Oxy IR/ROXICODONE Take 1 tablet (5 mg total) by mouth every 4 (four) hours as needed for moderate pain.   tadalafil 5 MG tablet Commonly known as:  CIALIS TAKE 1 TABLET BY MOUTH ONCE A DAY What changed:  when to take this   triamcinolone cream 0.1 % Commonly known as:  KENALOG Apply 1 application topically 2 (two) times daily as needed. What changed:  reasons to take this      Follow-up Information    [facility]. Schedule an appointment as soon as possible for a visit in 1 week(s).   Contact information: [address] [place] [id number] [phone]          Signed: Joyice Faster Granvil Djordjevic 04/23/2018, 10:39 AM

## 2018-04-23 NOTE — Evaluation (Signed)
Occupational Therapy Evaluation Patient Details Name: Randall Reeves MRN: [id number] DOB: 22-May-1945 Today's Date: 04/23/2018    History of Present Illness 73 yo admitted after fall from skid steer with Left 7-9 rib rx and scalp lac. PMhx: HTN, HLD, CAD, OSA, CKD   Clinical Impression   PTA, pt was living with his wife and was independent. Currently, pt performing ADLs and functional mobility at [place] level. Provided education on bed mobility, grooming, UB ADLs, LB ADLs, toileting, and shower transfer; pt demonstrated understanding. Answered all pt questions. Recommend dc home once medically stable per physician. All acute OT needs met and will sign off. Thank you.     Follow Up Recommendations  No OT follow up;Supervision/Assistance - 24 hour    Equipment Recommendations  None recommended by OT    Recommendations for Other Services       Precautions / Restrictions Precautions Precautions: Fall Restrictions Weight Bearing Restrictions: No      Mobility Bed Mobility Overal bed mobility: Modified Independent             General bed mobility comments: Educating pt on log roll technique to right to reduce pain  Transfers Overall transfer level: Needs assistance   Transfers: Sit to/from Stand Sit to Stand: Min guard;Supervision         General transfer comment: Supervision-Min Guard A for safety    Balance Overall balance assessment: History of Falls                                         ADL either performed or assessed with clinical judgement   ADL Overall ADL's : Needs assistance/impaired                                       General ADL Comments: Pt performing ADLs at supervision level for safety. Providing education on compensatory techniques for grooming, UB ADLs, LB ADLs, and functional transfers to increase safety and independence and decrease pain.     Vision         Perception     Praxis       Pertinent Vitals/Pain Pain Assessment: Faces Pain Score: 6  Faces Pain Scale: Hurts even more Pain Location: head, chest Pain Descriptors / Indicators: Aching;Discomfort;Constant Pain Intervention(s): Monitored during session;Limited activity within patient's tolerance;Repositioned     Hand Dominance Right   Extremity/Trunk Assessment Upper Extremity Assessment Upper Extremity Assessment: Overall WFL for tasks assessed   Lower Extremity Assessment Lower Extremity Assessment: Defer to PT evaluation   Cervical / Trunk Assessment Cervical / Trunk Assessment: Other exceptions Cervical / Trunk Exceptions: scalp lac and rib fxs   Communication Communication Communication: No difficulties   Cognition Arousal/Alertness: Awake/alert Behavior During Therapy: WFL for tasks assessed/performed Overall Cognitive Status: Within Functional Limits for tasks assessed                                     General Comments  Also providing pt with back handout to increase carry over and recall of technique provided for rib fxs    Exercises     Shoulder Instructions      Home Living Family/patient expects to be discharged to:: Private residence Living Arrangements: Spouse/significant other  Available Help at Discharge: Family;Available 24 hours/day Type of Home: House Home Access: Stairs to enter [employer] of Steps: 4   Home Layout: Two level;Able to live on main level with bedroom/bathroom     Bathroom Shower/Tub: Occupational psychologist: Standard     Home Equipment: Shower seat - built in          Prior Functioning/Environment Level of Independence: Independent        Comments: ADLs, IADLs, and enjoys yard work        OT Problem List: Decreased range of motion;Decreased activity tolerance;Decreased knowledge of use of DME or AE;Decreased knowledge of precautions      OT Treatment/Interventions:      OT Goals(Current goals can  be found in the care plan section) Acute Rehab OT Goals Patient Stated Goal: Go home today OT Goal Formulation: All assessment and education complete, DC therapy  OT Frequency:     Barriers to D/C:            Co-evaluation              AM-PAC OT "6 Clicks" Daily Activity     Outcome Measure Help from another person eating meals?: None Help from another person taking care of personal grooming?: None Help from another person toileting, which includes using toliet, bedpan, or urinal?: None Help from another person bathing (including washing, rinsing, drying)?: None Help from another person to put on and taking off regular upper body clothing?: None Help from another person to put on and taking off regular lower body clothing?: None 6 Click Score: 24   End of Session Nurse Communication: Mobility status  Activity Tolerance: Patient tolerated treatment well Patient left: in chair;with call bell/phone within reach  OT Visit Diagnosis: Other abnormalities of gait and mobility (R26.89);Muscle weakness (generalized) (M62.81);Pain Pain - Right/Left: Left Pain - part of body: (Ribs)                Time: 1100-1134 OT Time Calculation (min): 34 min Charges:  OT General Charges $OT Visit: 1 Visit OT Evaluation $OT Eval Low Complexity: 1 Low OT Treatments $Self Care/Home Management : 8-22 mins  Laquincy Eastridge MSOT, OTR/L Acute Rehab Pager: [phone] Office: [place] 04/23/2018, 11:50 AM

## 2018-04-23 NOTE — H&P (Signed)
Randall Reeves is an 73 y.o. male.   Chief Complaint: rib fx, scalp lac after fall HPI: 73 yom who is somnolent when I see him after undergoing scalp lac repair in er.  He has pmh significant for chronic kidney disease, htn, high cholesterol and gerd.  He apparently fell from a tractor but does not remember what happened. He denies loc.  He drove home after that and wife noted his head bleeding. He then came to er. He complains of pain in chest.  No other symptoms now. Underwent evaluation in the er and was found to have multiple rib fx with no associated hptx.  He had large scalp lac repaired with staples in the er before I was called as well.   Past Medical History:  Diagnosis Date  . Allergy   . CKD (chronic kidney disease), stage III ([place])   . Coronary atherosclerosis of unspecified type of vessel, native or graft 2011   MI but cath benign. Dr Otis Brace  . GERD (gastroesophageal reflux disease)   . Hyperlipidemia   . Hypertension   . Obstructive sleep apnea    Cpap as of 27 days ago    Past Surgical History:  Procedure Laterality Date  . CARDIAC CATHETERIZATION    . CARPAL TUNNEL RELEASE Left 02/2013  . INGUINAL HERNIA REPAIR Left 1983   with vasectomy  . SPERMATOCELECTOMY  1998  . VASECTOMY  1983   with inguinal hernia repair    Family History  Problem Relation Age of Onset  . Stroke Mother   . Hypertension Mother   . Diabetes Father   . Asthma Father   . Heart failure Father   . Heart disease Father   . Hypertension Sister   . Diabetes Sister   . Neuropathy Brother   . Hypertension Brother   . Diabetes Brother   . Heart disease Brother   . Prostate cancer Brother   . Hypertension Brother   . Hypertension Brother   . Hypertension Brother    Social History:  reports that he has never smoked. He has never used smokeless tobacco. He reports current alcohol use of about 3.0 - 5.0 standard drinks of alcohol per week. He reports that he does not use drugs.  Allergies:   Allergies  Allergen Reactions  . Bisoprolol-Hydrochlorothiazide     REACTION: muscle pain, cramps  . Codeine Nausea Only    meds losartan, lipitor, prilosec   Results for orders placed or performed during the hospital encounter of 04/22/18 (from the past 48 hour(s))  CBC with Differential     Status: Abnormal   Collection Time: 04/22/18 10:17 PM  Result Value Ref Range   WBC 15.8 (H) 4.0 - 10.5 K/uL   RBC 4.40 4.22 - 5.81 MIL/uL   Hemoglobin 13.9 13.0 - 17.0 g/dL   HCT 41.9 39.0 - 52.0 %   MCV 95.2 80.0 - 100.0 fL   MCH 31.6 26.0 - 34.0 pg   MCHC 33.2 30.0 - 36.0 g/dL   RDW 12.8 11.5 - 15.5 %   Platelets 217 150 - 400 K/uL   nRBC 0.0 0.0 - 0.2 %   Neutrophils Relative % 86 %   Neutro Abs 13.5 (H) 1.7 - 7.7 K/uL   Lymphocytes Relative 5 %   Lymphs Abs 0.8 0.7 - 4.0 K/uL   Monocytes Relative 8 %   Monocytes Absolute 1.2 (H) 0.1 - 1.0 K/uL   Eosinophils Relative 0 %   Eosinophils Absolute 0.0 0.0 -  0.5 K/uL   Basophils Relative 0 %   Basophils Absolute 0.1 0.0 - 0.1 K/uL   Immature Granulocytes 1 %   Abs Immature Granulocytes 0.15 (H) 0.00 - 0.07 K/uL    Comment: Performed at [facility], [place] [address] [place], [place] [address]  Troponin I - ONCE - STAT     Status: None   Collection Time: 04/22/18 10:17 PM  Result Value Ref Range   Troponin I <0.03 <0.03 ng/mL    Comment: Performed at [facility], [place] [address] [place], [place] [address]  Basic metabolic panel     Status: Abnormal   Collection Time: 04/22/18 10:17 PM  Result Value Ref Range   Sodium 135 135 - 145 mmol/L   Potassium 3.9 3.5 - 5.1 mmol/L   Chloride 106 98 - 111 mmol/L   CO2 18 (L) 22 - 32 mmol/L   Glucose, Bld 228 (H) 70 - 99 mg/dL   BUN 33 (H) 8 - 23 mg/dL   Creatinine, Ser 1.71 (H) 0.61 - 1.24 mg/dL   Calcium 9.3 8.9 - 10.3 mg/dL   GFR calc non Af Amer 40 (L) >60 mL/min   GFR calc Af Amer 46 (L) >60 mL/min   Anion gap 11 5 - 15    Comment: Performed at [facility] [address] [place], [place] [address]   Dg Ribs Unilateral W/chest Left  Result Date: 04/22/2018 CLINICAL DATA:  Post fall with left rib pain. EXAM: LEFT RIBS AND CHEST - 3+ VIEW COMPARISON:  Chest radiograph and CT 11/23/2012 FINDINGS: Remote left rib fractures involving ribs 5 through 7. No acute fracture of the ribs. There is no evidence of pneumothorax or pleural effusion. Both lungs are clear. Heart size and mediastinal contours are unchanged from prior exam. IMPRESSION: Remote left rib fractures. No acute fracture or acute abnormality. Electronically Signed   By: Keith Rake M.D.   On: 04/22/2018 23:15   Dg Thoracic Spine 2 View  Result Date: 04/22/2018 CLINICAL DATA:  Thoracolumbar back pain after fall. EXAM: THORACIC SPINE 2 VIEWS COMPARISON:  None. FINDINGS: Exaggerated thoracic kyphosis. Vertebral body heights are preserved. No evidence of acute fracture. Mild diffuse disc space narrowing and endplate spurring in the midthoracic spine. Posterior elements are grossly intact. No paravertebral soft tissue abnormality to suggest acute injury. IMPRESSION: Exaggerated thoracic kyphosis without evidence of acute fracture. Electronically Signed   By: Keith Rake M.D.   On: 04/22/2018 23:17   Dg Lumbar Spine Complete  Result Date: 04/22/2018 CLINICAL DATA:  Thoracolumbar back pain after fall. EXAM: LUMBAR SPINE - COMPLETE 4+ VIEW COMPARISON:  None. FINDINGS: The alignment is maintained. Vertebral body heights are normal. There is no listhesis. The posterior elements are intact. Disc space narrowing at L4-L5 and to a lesser extent L3-L4. Mild facet hypertrophy in the lower lumbar spine. No fracture. Sacroiliac joints are symmetric and normal. IMPRESSION: No fracture or subluxation of the lumbar spine. Degenerative disc disease. Electronically Signed   By: Keith Rake M.D.   On: 04/22/2018 23:18   Ct Head Wo Contrast  Result Date: 04/22/2018 CLINICAL DATA:  Fall, head injury EXAM: CT  HEAD WITHOUT CONTRAST CT CERVICAL SPINE WITHOUT CONTRAST TECHNIQUE: Multidetector CT imaging of the head and cervical spine was performed following the standard protocol without intravenous contrast. Multiplanar CT image reconstructions of the cervical spine were also generated. COMPARISON:  12/28/2015 FINDINGS: CT HEAD FINDINGS Brain: No evidence of acute infarction, hemorrhage, hydrocephalus,  extra-axial collection or mass lesion/mass effect. Extensive periventricular and deep white matter hypodensity. Vascular: No hyperdense vessel or unexpected calcification. Skull: Normal. Negative for fracture or focal lesion. Sinuses/Orbits: No acute finding. Other: Large laceration of the left scalp vertex. CT CERVICAL SPINE FINDINGS Alignment: Normal. Skull base and vertebrae: No acute fracture. No primary bone lesion or focal pathologic process. Soft tissues and spinal canal: No prevertebral fluid or swelling. No visible canal hematoma. Disc levels: Moderate multilevel disc space height loss and osteophytosis. Upper chest: Negative. Other: None. IMPRESSION: 1. No acute intracranial pathology. Advanced small-vessel white matter disease. 2.  Large laceration of the left scalp vertex. 3.  No fracture or static subluxation of the cervical spine. Electronically Signed   By: Eddie Candle M.D.   On: 04/22/2018 22:56   Ct Chest Wo Contrast  Result Date: 04/23/2018 CLINICAL DATA:  Left rib pain after injury. EXAM: CT CHEST WITHOUT CONTRAST TECHNIQUE: Multidetector CT imaging of the chest was performed following the standard protocol without IV contrast. COMPARISON:  Radiographs earlier this day. FINDINGS: Cardiovascular: Aortic atherosclerosis and tortuosity. No periaortic stranding. Normal heart size. No pericardial effusion. Coronary artery calcifications. Mediastinum/Nodes: No mediastinal hematoma. No mediastinal adenopathy. The esophagus is decompressed. No visualized thyroid nodule. Lungs/Pleura: No pneumothorax. No  evidence of pulmonary contusion. Dependent lower lobe opacities consistent with atelectasis. Minimal left pleural thickening without pleural effusion. No pulmonary edema. Trachea and mainstem bronchi are patent. Upper Abdomen: Nonobstructing right renal stone versus vascular calcification. No free fluid. No acute finding. Musculoskeletal: Acute nondisplaced posterior seventh, eighth, ninth rib fractures. Additional acute fracture of the seventh and eighth ribs at the costovertebral junction. Acute nondisplaced left sixth and seventh transverse process fractures. No vertebral body or middle column extension. Remote fractures of the sternum and anterior left ribs. IMPRESSION: 1. Multiple acute left rib fractures including posterior seventh, eighth, and ninth ribs. The seventh and eighth ribs are segmental and also fractured at the costovertebral junction. This approaches but does not strictly meet criteria for flail chest. 2. Acute left sixth and seventh transverse process fractures. 3. No pneumothorax. Mild left pleural thickening without effusion. Dependent atelectasis in both lungs. 4. Coronary artery calcifications. Aortic Atherosclerosis (ICD10-I70.0). Electronically Signed   By: Keith Rake M.D.   On: 04/23/2018 00:04   Ct Cervical Spine Wo Contrast  Result Date: 04/22/2018 CLINICAL DATA:  Fall, head injury EXAM: CT HEAD WITHOUT CONTRAST CT CERVICAL SPINE WITHOUT CONTRAST TECHNIQUE: Multidetector CT imaging of the head and cervical spine was performed following the standard protocol without intravenous contrast. Multiplanar CT image reconstructions of the cervical spine were also generated. COMPARISON:  12/28/2015 FINDINGS: CT HEAD FINDINGS Brain: No evidence of acute infarction, hemorrhage, hydrocephalus, extra-axial collection or mass lesion/mass effect. Extensive periventricular and deep white matter hypodensity. Vascular: No hyperdense vessel or unexpected calcification. Skull: Normal. Negative for  fracture or focal lesion. Sinuses/Orbits: No acute finding. Other: Large laceration of the left scalp vertex. CT CERVICAL SPINE FINDINGS Alignment: Normal. Skull base and vertebrae: No acute fracture. No primary bone lesion or focal pathologic process. Soft tissues and spinal canal: No prevertebral fluid or swelling. No visible canal hematoma. Disc levels: Moderate multilevel disc space height loss and osteophytosis. Upper chest: Negative. Other: None. IMPRESSION: 1. No acute intracranial pathology. Advanced small-vessel white matter disease. 2.  Large laceration of the left scalp vertex. 3.  No fracture or static subluxation of the cervical spine. Electronically Signed   By: Eddie Candle M.D.   On: 04/22/2018 22:56  Review of Systems  Respiratory: Negative for shortness of breath.   Cardiovascular: Positive for chest pain.  Gastrointestinal: Negative for abdominal pain.  Musculoskeletal: Positive for back pain.  Neurological: Positive for headaches. Negative for sensory change, speech change, loss of consciousness and weakness.  All other systems reviewed and are negative.   Blood pressure 136/85, pulse 88, temperature 98.1 F (36.7 C), temperature source Oral, resp. rate (!) 21, height 5\' 11"  (1.803 m), weight 86.2 kg, SpO2 98 %. Physical Exam  Vitals reviewed. Constitutional: He is oriented to person, place, and time. He appears well-developed and well-nourished.  HENT:  Head: Normocephalic.  Right Ear: External ear normal.  Left Ear: External ear normal.  Mouth/Throat: Oropharynx is clear and moist.  Head bandaged after scalp lac repair, dressing dry  Eyes: Pupils are equal, round, and reactive to light. EOM are normal. No scleral icterus.  Neck: Normal range of motion. Neck supple. No spinous process tenderness and no muscular tenderness present.  Cardiovascular: Normal rate, regular rhythm, normal heart sounds and intact distal pulses.  Respiratory: Effort normal and breath sounds  normal. He exhibits tenderness.  GI: Soft. Bowel sounds are normal. There is no abdominal tenderness.  Genitourinary:    Penis normal.   Musculoskeletal:        General: No tenderness or deformity.  Lymphadenopathy:    He has no cervical adenopathy.  Neurological: He is alert and oriented to person, place, and time. He has normal strength. No sensory deficit. GCS eye subscore is 4. GCS verbal subscore is 5. GCS motor subscore is 6.  Skin: Skin is warm and dry.  Psychiatric: He has a normal mood and affect. His behavior is normal.     Assessment/Plan Fall from tractor Left 7-9 rib fractures, tp fx- no associated hptx now, will admit for pain control, pulmonary toilet, repeat cxr in am, pt/ot in am as well Scalp lac- will need staples out later, placed by ER staff HTN/CKD- hold losartan for now, iv fluids, will recheck cr to see if stays at baseline Lovenox, scds  Rolm Bookbinder, MD 04/23/2018, 1:24 AM

## 2018-04-23 NOTE — Evaluation (Signed)
Physical Therapy Evaluation Patient Details Name: Randall Reeves MRN: [id number] DOB: January 18, 1946 Today's Date: 04/23/2018   History of Present Illness  73 yo admitted after fall from skid steer with Left 7-9 rib rx and scalp lac. PMhx: HTN, HLD, CAD, OSA, CKD  Clinical Impression  Pt pleasant and eager to get OOB today. Pt sleeps in recliner at home and states back soreness from working in yard prior to accident and fall. Pt moving well but slowly due to pain. Slightly unsteady gait with need for supervision for safety and lines but anticipate pt will quickly return to baseline function. Pt will benefit from acute therapy to maximize mobility, function and balance to decrease burden of care and fall risk. Recommend daily ambulation with nursing staff.   HR 84 VSS    Follow Up Recommendations No PT follow up    Equipment Recommendations  None recommended by PT    Recommendations for Other Services       Precautions / Restrictions Precautions Precautions: Fall Restrictions Weight Bearing Restrictions: No      Mobility  Bed Mobility Overal bed mobility: Modified Independent             General bed mobility comments: increased time with rail   Transfers Overall transfer level: Needs assistance   Transfers: Sit to/from Stand Sit to Stand: Min guard         General transfer comment: guarding for lines and safety as pt slow moving and cautious due to pain  Ambulation/Gait Ambulation/Gait assistance: Min guard Gait Distance (Feet): 300 Feet Assistive device: None Gait Pattern/deviations: Step-through pattern   Gait velocity interpretation: >2.62 ft/sec, indicative of community ambulatory General Gait Details: decreased stride with slow cautious gait due to pain, pt with 2 periods of partial LOB and scissoring able to recover without assist  Stairs            Wheelchair Mobility    Modified Rankin (Stroke Patients Only)       Balance Overall balance  assessment: History of Falls                                           Pertinent Vitals/Pain Pain Assessment: 0-10 Pain Score: 6  Pain Location: head, chest Pain Descriptors / Indicators: Aching;Discomfort;Constant Pain Intervention(s): Limited activity within patient's tolerance;Premedicated before session;Monitored during session;Repositioned    Home Living Family/patient expects to be discharged to:: Private residence Living Arrangements: Spouse/significant other Available Help at Discharge: Family;Available 24 hours/day Type of Home: House Home Access: Stairs to enter   [employer] of Steps: 4 Home Layout: Two level;Able to live on main level with bedroom/bathroom Home Equipment: None      Prior Function Level of Independence: Independent               Hand Dominance   Dominant Hand: Right    Extremity/Trunk Assessment   Upper Extremity Assessment Upper Extremity Assessment: Overall WFL for tasks assessed    Lower Extremity Assessment Lower Extremity Assessment: Overall WFL for tasks assessed    Cervical / Trunk Assessment Cervical / Trunk Assessment: Other exceptions Cervical / Trunk Exceptions: scalp lac  Communication   Communication: No difficulties  Cognition Arousal/Alertness: Awake/alert Behavior During Therapy: WFL for tasks assessed/performed Overall Cognitive Status: Within Functional Limits for tasks assessed  General Comments      Exercises     Assessment/Plan    PT Assessment Patient needs continued PT services  PT Problem List Decreased balance;Decreased activity tolerance;Decreased mobility;Decreased safety awareness       PT Treatment Interventions Gait training;Therapeutic activities;Stair training;DME instruction;Functional mobility training;Patient/family education;Balance training    PT Goals (Current goals can be found in the Care Plan  section)  Acute Rehab PT Goals Patient Stated Goal: return home PT Goal Formulation: With patient Time For Goal Achievement: 04/30/18 Potential to Achieve Goals: Good    Frequency Min 3X/week   Barriers to discharge        Co-evaluation               AM-PAC PT "6 Clicks" Mobility  Outcome Measure Help needed turning from your back to your side while in a flat bed without using bedrails?: A Little Help needed moving from lying on your back to sitting on the side of a flat bed without using bedrails?: None Help needed moving to and from a bed to a chair (including a wheelchair)?: A Little Help needed standing up from a chair using your arms (e.g., wheelchair or bedside chair)?: A Little Help needed to walk in hospital room?: A Little Help needed climbing 3-5 steps with a railing? : A Little 6 Click Score: 19    End of Session Equipment Utilized During Treatment: Gait belt Activity Tolerance: Patient tolerated treatment well Patient left: in chair;with call bell/phone within reach;with chair alarm set Nurse Communication: Mobility status PT Visit Diagnosis: Other abnormalities of gait and mobility (R26.89)    Time: 2876-8115 PT Time Calculation (min) (ACUTE ONLY): 24 min   Charges:   PT Evaluation $PT Eval Moderate Complexity: [place] Pager: [phone] Office: [place] 04/23/2018, 8:12 AM

## 2018-04-23 NOTE — Discharge Instructions (Signed)
Blunt Chest Trauma Blunt chest trauma is an injury that is caused by a hard, direct hit (blow) to the chest. The blow can be strong enough to injure multiple body parts and organs. A blunt trauma does not involve a puncture of the skin. Blunt chest trauma often results in bruised or broken (fractured) ribs. In many cases, the soft tissue in the chest wall is also injured, and that damage causes pain and bruising. Internal organs, such as the heart and lungs, can be injured as well. Blunt chest trauma can lead to serious medical problems. This injury requires immediate medical care. What are the causes? This condition Grams be caused by:  Motor vehicle collisions.  Falls.  Physical violence.  Sports injuries. What are the signs or symptoms? Symptoms of this condition include:  Chest pain. The pain Lim be worse when you breathe deeply or move.  Shortness of breath.  Light-headedness.  Bruising.  Tenderness.  Swelling.  Drowsiness or confusion.  Cold and clammy skin.  Feeling as if your heart is racing. How is this diagnosed? This condition is diagnosed based on your medical history and a physical exam. You Lore also have imaging tests, including:  X-ray.  CT scan.  MRI.  Ultrasound. How is this treated? Treatment for this condition depends on what type of injury you have and how severe it is. You Laubach need to stay in the hospital. Treatment Verrill include:  Pain medicine. You Clubb be given pain medicine through an IV at first. You Bresnan also be given over-the-counter and prescription pain medicines.  Tubes or other devices to help you breathe (like an incentive spirometer).  Inserting a tube into your chest to drain excess fluid, blood, or air.  Fluid or blood transfusions through an IV.  Surgery to repair broken ribs and fix damaged tissue and organs.  Lung (pulmonary) rehabilitation. This Lok include exercises, counseling, or support groups. Follow these  instructions at home: Activity   Do not lift anything that is heavier than 10 lb (4.5 kg), or the limit that you are told, until your health care provider says that it is safe.  Limit your activity as told by your health care provider. Ask your health care provider what activities are safe for you.  Do exercises as told by your health care provider. Medicines  Take over-the-counter and prescription medicines only as told by your health care provider.  Do not drive or use heavy machinery while taking prescription pain medicine.  If you are taking prescription pain medicine, take actions to prevent or treat constipation. Your health care provider Follmer recommend that you: ? Drink enough fluid to keep your urine pale yellow. ? Eat foods that are high in fiber, such as fresh fruits and vegetables, whole grains, and beans. ? Limit foods that are high in fat and processed sugars, such as fried or sweet foods. ? Take an over-the-counter or prescription medicine for constipation. General instructions   If directed, apply ice to the injured area: ? Put ice in a plastic bag. ? Place a towel between your skin and the bag. ? Leave the ice on for 20 minutes, 2-3 times a day.  Do not use any products that contain nicotine or tobacco, such as cigarettes and e-cigarettes. These Duran delay healing after an injury. If you need help quitting, ask your health care provider.  Use your incentive spirometer as directed to help you take deep breaths.  Consider joining a support group. This Neisen help  you learn about your condition and techniques to help with recovery.  Keep all follow-up visits as told by your health care provider. This is important. Follow-up visits Pinzon include counseling. Contact a health care provider if:  Your pain gets worse.  You develop a cough or wheezing. Get help right away if:  You have any of these: ? Shortness of breath. ? Pain in your abdomen. ? Nausea or vomiting. ? A  fever or chills. ? A rapid heart rate.  You cough up blood.  You feel dizzy or weak.  You faint.  You have severe chest pain. This Beers come with other symptoms, such as: ? Dizziness. ? Shortness of breath. ? Pain in your neck, jaw, or back, or in one arm or both arms. These symptoms Rambert represent a serious problem that is an emergency. Do not wait to see if the symptoms will go away. Get medical help right away. Call your local emergency services (911 in the [place] Do not drive yourself to the hospital. Summary  Blunt chest trauma is an injury that is caused by a hard, direct hit (blow) to the chest. It Schrum involve broken ribs, damage to the chest wall, and injury to internal organs such as the heart and lungs.  Blunt chest trauma can lead to serious medical problems. This injury requires immediate medical care.  Symptoms Brickell include chest pain when moving or taking deep breaths, shortness of breath, bruising or swelling of the chest, faster heartbeat, light-headedness, or drowsiness.  Treatment for this condition depends on what type of injury you have and how severe it is.  Make sure you know which symptoms should cause you to get help right away. This information is not intended to replace advice given to you by your health care provider. Make sure you discuss any questions you have with your health care provider. Document Released: 02/19/2004 Document Revised: 12/01/2016 Document Reviewed: 12/01/2016 Elsevier Interactive Patient Education  2019 [place], Adult A laceration is a cut that Brar go through all layers of the skin and into the tissue that is right under the skin. Some lacerations heal on their own. Others need to be closed with stitches (sutures), staples, skin adhesive strips, or skin glue. Proper care of a laceration reduces the risk for infection, helps the laceration heal better, and Shea prevent scarring. How to care for your laceration Wash your  hands with soap and water before touching your wound or changing your bandage (dressing). If soap and water are not available, use hand sanitizer. Keep the wound clean and dry. If you were given a dressing, you should change it at least once a day, or as told by your health care provider. You should also change it if it becomes wet or dirty. If sutures or staples were used:  Keep the wound completely dry for the first 24 hours, or as told by your health care provider. After that time, you Tierney shower or bathe. However, make sure that the wound is not soaked in water until after the sutures or staples have been removed.  Clean the wound once each day, or as told by your health care provider: ? Wash the wound with soap and water. ? Rinse the wound with water to remove all soap. ? Pat the wound dry with a clean towel. Do not rub the wound.  After cleaning the wound, apply a thin layer of antibiotic ointment as told by your health care provider. This will help  prevent infection and keep the dressing from sticking to the wound.  Have the sutures or staples removed as told by your health care provider. If skin adhesive strips were used:  Do not get the skin adhesive strips wet. You Biddinger shower or bathe, but be careful to keep the wound dry.  If the wound gets wet, pat it dry with a clean towel. Do not rub the wound.  Skin adhesive strips fall off on their own. You Halt trim the strips as the wound heals. Do not remove skin adhesive strips that are still stuck to the wound. They will fall off in time. If skin glue was used:  Try to keep the wound dry, but you Rhem briefly wet it in the shower or bath. Do not soak the wound in water, such as by swimming.  After you have showered or bathed, gently pat the wound dry with a clean towel. Do not rub the wound.  Do not do any activities that will make you sweat heavily until the skin glue has fallen off on its own.  Do not apply liquid, cream, or  ointment medicine to the wound while the skin glue is in place. Using those Shiflet loosen the film before the wound has healed.  If a dressing is placed over the wound, be careful not to apply tape directly over the skin glue. Doing that Truman cause the glue to be pulled off before the wound has healed.  Do not pick at the glue. Skin glue usually remains in place for 5-10 days and then falls off the skin. General instructions   Take over-the-counter and prescription medicines only as told by your health care provider.  If you were prescribed an antibiotic medicine or ointment, take or apply it as told by your health care provider. Do not stop using it even if your condition improves.  Do not scratch or pick at the wound.  Check your wound every day for signs of infection. Watch for: ? Redness, swelling, or pain. ? Fluid, blood, or pus.  Raise (elevate) the injured area above the level of your heart while you are sitting or lying down for the first 24-48 hours after the laceration is repaired.  If directed, put ice on the affected area: ? Put ice in a plastic bag. ? Place a towel between your skin and the bag. ? Leave the ice on for 20 minutes, 2-3 times a day.  Keep all follow-up visits as told by your health care provider. This is important. Contact a health care provider if:  You received a tetanus shot and you have swelling, severe pain, redness, or bleeding at the injection site.  You have a fever.  A wound that was closed breaks open.  You notice a bad smell coming from your wound or your dressing.  You notice something coming out of the wound, such as wood or glass.  Your pain is not controlled with medicine.  You have increased redness, swelling, or pain at the site of your wound.  You have fluid, blood, or pus coming from your wound.  You need to change the dressing often due to fluid, blood, or pus that is draining from the wound.  You develop a new rash.  You  develop numbness around the wound. Get help right away if:  You develop severe swelling around the wound.  Your pain suddenly increases and is severe.  You develop painful lumps near the wound or on skin anywhere else  on your body.  You have a red streak going away from your wound.  The wound is on your hand or foot and you cannot properly move a finger or toe.  The wound is on your hand or foot, and you notice that your fingers or toes look pale or bluish. Summary  A laceration is a cut that Doom go through all layers of the skin and into the tissue that is right under the skin.  Some lacerations heal on their own. Others need to be closed with stitches (sutures), staples, skin adhesive strips, or skin glue.  Proper care of a laceration reduces the risk of infection, helps the laceration heal better, and prevents scarring. This information is not intended to replace advice given to you by your health care provider. Make sure you discuss any questions you have with your health care provider. Document Released: 01/11/2005 Document Revised: 01/31/2017 Document Reviewed: 01/31/2017 Elsevier Interactive Patient Education  2019 [employer].

## 2018-04-25 ENCOUNTER — Telehealth: Payer: Self-pay

## 2018-04-25 NOTE — Telephone Encounter (Signed)
Transition Care Management Follow-up Telephone Call   Date discharged? 04/23/2018   How have you been since you were released from the hospital? Feels very sore. Left shoulder pain is present. Patient is not sure if he fell on that side and if it is related to the broken ribs. He does not want to come in for that if at all possible. Patient was given Oxycodone RX but he did not fill it and is not taking it, he does not like a strong medication like that. He has not been taking much for pain, maybe Tylenol at times but could not say if that has helped his shoulder. Very painful to stand up especially and move.   Do you understand why you were in the hospital? yes   Do you understand the discharge instructions? yes   Where were you discharged to? Home with his wife   Items Reviewed:  Medications reviewed: yes-no questions  Allergies reviewed: yes  Dietary changes reviewed: yes-no diet changes have been made per patient  Referrals reviewed: yes-suppose to follow up with Trauma Care office. Appointment made for 05/02/2018 for removal of staples per patient with Trauma center.   Functional Questionnaire:   Activities of Daily Living (ADLs):   He states they are independent in the following: ambulation, toileting. States they require assistance with the following: fixing food-his wife does right now, bathing, dressing.   Any transportation issues/concerns?: no   Any patient concerns? See notes above.  Confirmed importance and date/time of follow-up visits scheduled No appointments have been made at this time. Will check With Dr. Silvio Pate if patient needs to have a visit for follow up.  Confirmed with patient if condition begins to worsen call PCP or go to the ER.  Patient was given the office number and encouraged to call back with question or concerns.  : yes

## 2018-04-26 NOTE — Telephone Encounter (Signed)
Spoke with patient. Patient is feeling better some today. Patient will give [place] a call back if he needs a visit with [place]. Patient will give [place] update at a later time after his visit with trauma office for removal of staples.

## 2018-04-26 NOTE — Telephone Encounter (Signed)
If he feels he is doing okay---no appointment needed. Could consider Webex in the next few days just to check in

## 2018-05-02 DIAGNOSIS — S0100XD Unspecified open wound of scalp, subsequent encounter: Secondary | ICD-10-CM | POA: Diagnosis not present

## 2018-05-02 DIAGNOSIS — S2242XD Multiple fractures of ribs, left side, subsequent encounter for fracture with routine healing: Secondary | ICD-10-CM | POA: Diagnosis not present

## 2018-05-02 DIAGNOSIS — W19XXXD Unspecified fall, subsequent encounter: Secondary | ICD-10-CM | POA: Diagnosis not present

## 2018-05-11 ENCOUNTER — Telehealth: Payer: Self-pay

## 2018-05-11 ENCOUNTER — Encounter: Payer: Self-pay | Admitting: Internal Medicine

## 2018-05-11 ENCOUNTER — Ambulatory Visit (INDEPENDENT_AMBULATORY_CARE_PROVIDER_SITE_OTHER): Payer: PPO | Admitting: Internal Medicine

## 2018-05-11 DIAGNOSIS — S2242XD Multiple fractures of ribs, left side, subsequent encounter for fracture with routine healing: Secondary | ICD-10-CM | POA: Diagnosis not present

## 2018-05-11 DIAGNOSIS — S2239XA Fracture of one rib, unspecified side, initial encounter for closed fracture: Secondary | ICD-10-CM | POA: Insufficient documentation

## 2018-05-11 DIAGNOSIS — S2249XA Multiple fractures of ribs, unspecified side, initial encounter for closed fracture: Secondary | ICD-10-CM | POA: Insufficient documentation

## 2018-05-11 MED ORDER — OXYCODONE HCL 5 MG PO TABS: 5.0000 mg | ORAL_TABLET | ORAL | 0 refills | Status: DC | PRN

## 2018-05-11 NOTE — Assessment & Plan Note (Signed)
His pain is not surprising given the extent of his injuries Has CKD so will avoid NSAIDs Continue the tylenol and biofreeze He will fill the prescription for the oxycodone--actually is too old (will send new one). He did get some doses in the hospital and tolerate it

## 2018-05-11 NOTE — Progress Notes (Signed)
Subjective:    Patient ID: Randall Reeves, male    DOB: 10-30-1945, 73 y.o.   MRN: [id number]  HPI Virtual visit due to pain and spasm in back Identification done Reviewed billing and consent given  [place] almost 3 weeks ago Thrown off piece of equipment---26 stitches in head Bad bruising in back Left rib fractures ---- 5 Hospitalized overnight Went back to ER 1 week later---took the staples out  Using tylenol 1000 mg tid Using biofreeze as well These help to some degree---but if he steps outside---then the pain will worsen--especially in the cold Sleeping fairly well--but awakens with pain at times He got Rx for oxycodone--but never filled it (afraid to fill it due to possible nausea)  Current Outpatient Medications on File Prior to Visit  Medication Sig Dispense Refill  . acetaminophen (TYLENOL) 500 MG tablet Take 1,000 mg by mouth every 8 (eight) hours as needed.    Marland Kitchen aspirin EC 81 MG tablet Take 81 mg by mouth daily.    Marland Kitchen atorvastatin (LIPITOR) 10 MG tablet TAKE 1 TABLET BY MOUTH ONCE A DAY (Patient taking differently: Take 10 mg by mouth daily. ) 90 tablet 0  . losartan (COZAAR) 50 MG tablet TAKE 1 TABLET BY MOUTH ONCE A DAY (Patient taking differently: Take 50 mg by mouth daily. ) 90 tablet 0  . LUTEIN PO Take 1 tablet by mouth every evening.    . Multiple Vitamin (MULTIVITAMIN) tablet Take 1 tablet by mouth daily.     Marland Kitchen omeprazole (PRILOSEC) 20 MG capsule TAKE 1 CAPSULE BY MOUTH TWICE DAILY (Patient taking differently: Take 20 mg by mouth every morning. ) 180 capsule 3  . tadalafil (CIALIS) 5 MG tablet TAKE 1 TABLET BY MOUTH ONCE A DAY (Patient taking differently: Take 5 mg by mouth every evening. ) 90 tablet 3  . triamcinolone cream (KENALOG) 0.1 % Apply 1 application topically 2 (two) times daily as needed. (Patient taking differently: Apply 1 application topically 2 (two) times daily as needed (skin care). ) 45 g 1   No current facility-administered medications on file prior  to visit.     Allergies  Allergen Reactions  . Bisoprolol-Hydrochlorothiazide     REACTION: muscle pain, cramps  . Codeine Nausea Only    Past Medical History:  Diagnosis Date  . Allergy   . CKD (chronic kidney disease), stage III ([place])   . Coronary atherosclerosis of unspecified type of vessel, native or graft 2011   MI but cath benign. Dr Otis Brace  . GERD (gastroesophageal reflux disease)   . Hyperlipidemia   . Hypertension   . Obstructive sleep apnea    Cpap as of 27 days ago    Past Surgical History:  Procedure Laterality Date  . CARDIAC CATHETERIZATION    . CARPAL TUNNEL RELEASE Left 02/2013  . INGUINAL HERNIA REPAIR Left 1983   with vasectomy  . SPERMATOCELECTOMY  1998  . VASECTOMY  1983   with inguinal hernia repair    Family History  Problem Relation Age of Onset  . Stroke Mother   . Hypertension Mother   . Diabetes Father   . Asthma Father   . Heart failure Father   . Heart disease Father   . Hypertension Sister   . Diabetes Sister   . Neuropathy Brother   . Hypertension Brother   . Diabetes Brother   . Heart disease Brother   . Prostate cancer Brother   . Hypertension Brother   . Hypertension Brother   .  Hypertension Brother     Social History   Socioeconomic History  . Marital status: Married    Spouse name: Not on file  . Number of children: 2  . Years of education: Not on file  . Highest education level: Not on file  Occupational History  . Occupation: Chief Strategy Officer  . Financial resource strain: Not on file  . Food insecurity:    Worry: Not on file    Inability: Not on file  . Transportation needs:    Medical: Not on file    Non-medical: Not on file  Tobacco Use  . Smoking status: Never Smoker  . Smokeless tobacco: Never Used  Substance and Sexual Activity  . Alcohol use: Yes    Alcohol/week: 3.0 - 5.0 standard drinks    Types: 3 - 5 Cans of beer per week    Comment: a beer a day maybe  . Drug use: No  . Sexual  activity: Not on file  Lifestyle  . Physical activity:    Days per week: Not on file    Minutes per session: Not on file  . Stress: Not on file  Relationships  . Social connections:    Talks on phone: Not on file    Gets together: Not on file    Attends religious service: Not on file    Active member of club or organization: Not on file    Attends meetings of clubs or organizations: Not on file    Relationship status: Not on file  . Intimate partner violence:    Fear of current or ex partner: Not on file    Emotionally abused: Not on file    Physically abused: Not on file    Forced sexual activity: Not on file  Other Topics Concern  . Not on file  Social History Narrative   Has living will   Wife is his health care POA--alternate is son   Would accept resuscitation attempts.   Would leave feeding tube decision to his wife   Review of Systems No SOB--but it will "cartch my breath" if he moves a certain way No N/V Eating okay    Objective:   Physical Exam  Constitutional: He appears well-developed. No distress.  Skin:  Abrasions are mostly healed along left lower thoracic area in back Still with ecchymosis along left mid back Tenderness in left paraspinal areas           Assessment & Plan:

## 2018-05-11 NOTE — Telephone Encounter (Signed)
Likely still related to recent rib fractures Will evaluate to be sure no pulmonary or other concerns

## 2018-05-11 NOTE — Telephone Encounter (Signed)
Inbound call to triage - patient's spouse reports patient is having increased back pain  Outbound call to patient - per spouse, patient is applying Biofreeze 3-4 times daily and taking ES Tylenol 1000 mg by mouth 3 times daily for pain management.  Spouse requests a prescription for a muscle relaxant.  Virtual video appt scheduled 05/11/18 @ 1430

## 2018-06-02 ENCOUNTER — Encounter: Payer: Self-pay | Admitting: Gastroenterology

## 2018-06-12 ENCOUNTER — Other Ambulatory Visit: Payer: Self-pay | Admitting: Internal Medicine

## 2018-06-15 ENCOUNTER — Ambulatory Visit (AMBULATORY_SURGERY_CENTER): Payer: Self-pay

## 2018-06-15 ENCOUNTER — Other Ambulatory Visit: Payer: Self-pay

## 2018-06-15 VITALS — Ht 68.5 in | Wt 193.0 lb

## 2018-06-15 DIAGNOSIS — Z86010 Personal history of colonic polyps: Secondary | ICD-10-CM

## 2018-06-15 MED ORDER — PEG 3350-KCL-NA BICARB-NACL 420 G PO SOLR: 4000.0000 mL | Freq: Once | ORAL | 0 refills | Status: AC

## 2018-06-15 NOTE — Progress Notes (Signed)
Denies allergies to eggs or soy products. Denies complication of anesthesia or sedation. Denies use of weight loss medication. Denies use of O2.   Emmi instructions given for colonoscopy.  Pre-Visit was conducted by phone due to Covid 19. Instructions were reviewed with patient and mailed to confirmed home address. Patient was given a choice of preps because his insurance does not cover Suprep or Plenvu. I offered the patient a sample of Plenvu if he wanted to come and pick it up or Golytely. Patient states that he would rather have Golytely so that is what was ordered. Patient was encouraged to call if he had any questions or concerns regarding instructions.

## 2018-06-20 ENCOUNTER — Encounter: Payer: Self-pay | Admitting: Gastroenterology

## 2018-06-30 ENCOUNTER — Telehealth: Payer: Self-pay | Admitting: *Deleted

## 2018-06-30 NOTE — Telephone Encounter (Signed)
Covid-19 screening questions  Have you traveled in the last 14 days? No If yes where?  Do you now or have you had a fever in the last 14 days? No  Do you have any respiratory symptoms of shortness of breath or cough now or in the last 14 days? No  Do you have any family members or close contacts with diagnosed or suspected Covid-19 in the past 14 days? No  Have you been tested for Covid-19 and found to be positive? No  Pt aware of care partner recommendations and to wear a mask into the building.

## 2018-07-03 ENCOUNTER — Other Ambulatory Visit: Payer: Self-pay

## 2018-07-03 ENCOUNTER — Ambulatory Visit (AMBULATORY_SURGERY_CENTER): Payer: PPO | Admitting: Gastroenterology

## 2018-07-03 ENCOUNTER — Encounter: Payer: Self-pay | Admitting: Gastroenterology

## 2018-07-03 VITALS — BP 100/77 | HR 76 | Temp 98.5°F | Resp 15 | Ht 68.5 in | Wt 198.0 lb

## 2018-07-03 DIAGNOSIS — Z1211 Encounter for screening for malignant neoplasm of colon: Secondary | ICD-10-CM | POA: Diagnosis not present

## 2018-07-03 DIAGNOSIS — D120 Benign neoplasm of cecum: Secondary | ICD-10-CM

## 2018-07-03 DIAGNOSIS — K635 Polyp of colon: Secondary | ICD-10-CM

## 2018-07-03 DIAGNOSIS — D122 Benign neoplasm of ascending colon: Secondary | ICD-10-CM

## 2018-07-03 DIAGNOSIS — Z86010 Personal history of colonic polyps: Secondary | ICD-10-CM | POA: Diagnosis not present

## 2018-07-03 MED ORDER — SODIUM CHLORIDE 0.9 % IV SOLN: 500.0000 mL | INTRAVENOUS | Status: DC

## 2018-07-03 NOTE — Op Note (Signed)
[place] Patient Name: Bartosz Burroughs Procedure Date: 07/03/2018 7:42 AM MRN: [id number] Endoscopist: Mallie Mussel L. Loletha Carrow , MD Age: 73 Referring MD:  Date of Birth: 05-Jan-1946 Gender: Male Account #: [id number] Procedure:                Colonoscopy Indications:              High risk colon cancer surveillance: Personal                            history of sessile serrated colon polyp (10 mm or                            greater in size, removed piecemeal; 04/2017) Medicines:                Monitored Anesthesia Care Procedure:                Pre-Anesthesia Assessment:                           - Prior to the procedure, a History and Physical                            was performed, and patient medications and                            allergies were reviewed. The patient's tolerance of                            previous anesthesia was also reviewed. The risks                            and benefits of the procedure and the sedation                            options and risks were discussed with the patient.                            All questions were answered, and informed consent                            was obtained. Prior Anticoagulants: The patient has                            taken no previous anticoagulant or antiplatelet                            agents except for aspirin. ASA Grade Assessment: II                            - A patient with mild systemic disease. After                            reviewing the risks and benefits, the patient was  deemed in satisfactory condition to undergo the                            procedure.                           After obtaining informed consent, the colonoscope                            was passed under direct vision. Throughout the                            procedure, the patient's blood pressure, pulse, and                            oxygen saturations were monitored continuously. The                     Colonoscope was introduced through the anus and                            advanced to the the cecum, identified by                            appendiceal orifice and ileocecal valve. The                            colonoscopy was performed without difficulty. The                            patient tolerated the procedure well. The quality                            of the bowel preparation was good. The ileocecal                            valve, appendiceal orifice, and rectum were                            photographed. The bowel preparation used was                            GoLYTELY via split dose instruction. Scope In: 8:03:58 AM Scope Out: 8:33:52 AM Scope Withdrawal Time: 0 hours 24 minutes 48 seconds  Total Procedure Duration: 0 hours 29 minutes 54 seconds  Findings:                 The perianal and digital rectal examinations were                            normal.                           A diminutive polyp was found in the cecum. The  polyp was flat. The polyp was removed with a cold                            snare. Resection and retrieval were complete. (Jar                            1)                           A 15 mm polyp was found in the ileocecal valve. The                            polyp was flat and multi-lobulated. The polyp was                            removed with a piecemeal technique using a cold                            snare. Resection and retrieval were complete. (Jar                            1)                           A 10 mm polyp was found in the distal ascending                            colon, at former polypectomy site. The polyp was                            flat. The polyp was removed with a piecemeal                            technique using a cold snare. Resection and                            retrieval were complete. (Jar 2)                           Retroflexion in the rectum was not  performed due to                            anatomy.                           The exam was otherwise without abnormality. Complications:            No immediate complications. Estimated Blood Loss:     Estimated blood loss was minimal. Impression:               - One diminutive polyp in the cecum, removed with a                            cold snare. Resected and retrieved.                           -  One 15 mm polyp at the ileocecal valve, removed                            piecemeal using a cold snare. Resected and                            retrieved.                           - One 10 mm polyp in the distal ascending colon,                            removed piecemeal using a cold snare. Resected and                            retrieved.                           - The examination was otherwise normal. Recommendation:           - Patient has a contact number available for                            emergencies. The signs and symptoms of potential                            delayed complications were discussed with the                            patient. Return to normal activities tomorrow.                            Written discharge instructions were provided to the                            patient.                           - Resume previous diet.                           - Continue present medications.                           - Await pathology results.                           - Repeat colonoscopy is recommended for                            surveillance. The colonoscopy date will be                            determined after pathology results from today's                            exam become  available for review. Henry L. Loletha Carrow, MD 07/03/2018 8:41:42 AM This report has been signed electronically.

## 2018-07-03 NOTE — Patient Instructions (Signed)
Read all of the handouts given to you by your recovery room nurse.  YOU HAD AN ENDOSCOPIC PROCEDURE TODAY AT THE  ENDOSCOPY CENTER:   Refer to the procedure report that was given to you for any specific questions about what was found during the examination.  If the procedure report does not answer your questions, please call your gastroenterologist to clarify.  If you requested that your care partner not be given the details of your procedure findings, then the procedure report has been included in a sealed envelope for you to review at your convenience later.  YOU SHOULD EXPECT: Some feelings of bloating in the abdomen. Passage of more gas than usual.  Walking can help get rid of the air that was put into your GI tract during the procedure and reduce the bloating. If you had a lower endoscopy (such as a colonoscopy or flexible sigmoidoscopy) you [place] notice spotting of blood in your stool or on the toilet paper. If you underwent a bowel prep for your procedure, you [place] not have a normal bowel movement for a few days.  Please Note:  You might notice some irritation and congestion in your nose or some drainage.  This is from the oxygen used during your procedure.  There is no need for concern and it should clear up in a day or so.  SYMPTOMS TO REPORT IMMEDIATELY:   Following lower endoscopy (colonoscopy or flexible sigmoidoscopy):  Excessive amounts of blood in the stool  Significant tenderness or worsening of abdominal pains  Swelling of the abdomen that is new, acute  Fever of 100F or higher   For urgent or emergent issues, a gastroenterologist can be reached at any hour by calling [phone].   DIET:  We do recommend a small meal at first, but then you [place] proceed to your regular diet.  Drink plenty of fluids but you should avoid alcoholic beverages for 24 hours.  ACTIVITY:  You should plan to take it easy for the rest of today and you should NOT DRIVE or use heavy machinery until  tomorrow (because of the sedation medicines used during the test).    FOLLOW UP: Our staff will call the number listed on your records 48-72 hours following your procedure to check on you and address any questions or concerns that you [place] have regarding the information given to you following your procedure. If we do not reach you, we will leave a message.  We will attempt to reach you two times.  During this call, we will ask if you have developed any symptoms of COVID 19. If you develop any symptoms (ie: fever, flu-like symptoms, shortness of breath, cough etc.) before then, please call [phone].  If you test positive for Covid 19 in the 2 weeks post procedure, please call and report this information to us.    If any biopsies were taken you will be contacted by phone or by letter within the next 1-3 weeks.  Please call us at [phone] if you have not heard about the biopsies in 3 weeks.    SIGNATURES/CONFIDENTIALITY: You and/or your care partner have signed paperwork which will be entered into your electronic medical record.  These signatures attest to the fact that that the information above on your After Visit Summary has been reviewed and is understood.  Full responsibility of the confidentiality of this discharge information lies with you and/or your care-partner. 

## 2018-07-03 NOTE — Progress Notes (Signed)
A/ox3, pleased with MAC, report to RN 

## 2018-07-03 NOTE — Progress Notes (Signed)
Called to room to assist during endoscopic procedure.  Patient ID and intended procedure confirmed with present staff. Received instructions for my participation in the procedure from the performing physician.  

## 2018-07-05 ENCOUNTER — Telehealth: Payer: Self-pay | Admitting: *Deleted

## 2018-07-05 NOTE — Telephone Encounter (Signed)
1. Have you developed a fever since your procedure? no  2.   Have you had an respiratory symptoms (SOB or cough) since your procedure? no  3.   Have you tested positive for COVID 19 since your procedure no  4.   Have you had any family members/close contacts diagnosed with the COVID 19 since your procedure?  no   If yes to any of these questions please route to Joylene John, RN and Alphonsa Gin, Therapist, sports.     Follow up Call-  Call back number 07/03/2018 05/24/2017  Post procedure Call Back phone  # [phone]  Permission to leave phone message Yes Yes  Some recent data might be hidden     Patient questions:  Do you have a fever, pain , or abdominal swelling? No. Pain Score  0 *  Have you tolerated food without any problems? Yes.    Have you been able to return to your normal activities? Yes.    Do you have any questions about your discharge instructions: Diet   No. Medications  No. Follow up visit  No.  Do you have questions or concerns about your Care? No.  Actions: * If pain score is 4 or above: No action needed, pain <4.

## 2018-07-06 ENCOUNTER — Encounter: Payer: Self-pay | Admitting: Gastroenterology

## 2018-07-11 DIAGNOSIS — H5213 Myopia, bilateral: Secondary | ICD-10-CM | POA: Diagnosis not present

## 2018-08-25 ENCOUNTER — Telehealth: Payer: Self-pay | Admitting: Pulmonary Disease

## 2018-08-25 DIAGNOSIS — G4733 Obstructive sleep apnea (adult) (pediatric): Secondary | ICD-10-CM

## 2018-08-25 NOTE — Telephone Encounter (Signed)
Called and spoke with pt letting him know the stated info per VS. Stated to him that we were going to place Rx to [facility] to have his cpap settings changed. Also stated to him to call us back in 2 weeks if he was still having trouble with sleeping after pressure change. Pt verbalized understanding. DME order placed. Nothing further needed.

## 2018-08-25 NOTE — Telephone Encounter (Signed)
Auto CPAP 07/26/18 to 08/24/18 >> used on 30 of 30 nights with average 6 hrs 47 min.  Average AHI 6 with median CPAP 7 and 95 th percentile CPAP 11 cm H2O.  No air leak.   Please let him know that his sleep apnea number was still mildly elevated.  Please send order to have his auto CPAP settings changed to 7 - 15 cm H2O.  He should call back in couple weeks after pressure change if he is still having trouble with his sleep.

## 2018-08-25 NOTE — Telephone Encounter (Signed)
Called and spoke with pt. Pt said he is unsure if the settings might need to be changed on his cpap machine. Pt said that he is not sure if it is blowing out enough air and also said that after last night wearing the cpap, this morning when he woke up he did not feel rested at all after wearing the cpap.  I stated to pt that I would print out a download of his cpap info and give it to VS to review. Stated to him once it was reviewed by VS, we would call him with recommendations and pt verbalized understanding. Download has been printed and placed on Dr. Juanetta Gosling desk. Routing encounter to VS as well.

## 2018-09-18 ENCOUNTER — Telehealth: Payer: Self-pay | Admitting: Internal Medicine

## 2018-09-18 MED ORDER — TRIAMCINOLONE ACETONIDE 0.1 % EX CREA: 1.0000 "application " | TOPICAL_CREAM | Freq: Two times a day (BID) | CUTANEOUS | 1 refills | Status: DC | PRN

## 2018-09-18 NOTE — Telephone Encounter (Signed)
Patient's wife,Linda, called.  Patient is out of Triamcinolone Cream 0.1%. They were using a different pharmacy when medication was prescribed.  Patient would like a refill sent to [facility].

## 2018-09-18 NOTE — Telephone Encounter (Signed)
Rx sent electronically.  

## 2018-10-03 ENCOUNTER — Ambulatory Visit: Payer: PPO | Admitting: Internal Medicine

## 2018-10-04 ENCOUNTER — Encounter: Payer: Self-pay | Admitting: Internal Medicine

## 2018-10-04 ENCOUNTER — Other Ambulatory Visit: Payer: Self-pay

## 2018-10-04 ENCOUNTER — Ambulatory Visit (INDEPENDENT_AMBULATORY_CARE_PROVIDER_SITE_OTHER): Payer: PPO | Admitting: Internal Medicine

## 2018-10-04 DIAGNOSIS — R3915 Urgency of urination: Secondary | ICD-10-CM

## 2018-10-04 DIAGNOSIS — R5383 Other fatigue: Secondary | ICD-10-CM | POA: Diagnosis not present

## 2018-10-04 DIAGNOSIS — R631 Polydipsia: Secondary | ICD-10-CM | POA: Insufficient documentation

## 2018-10-04 LAB — GLUCOSE, POCT (MANUAL RESULT ENTRY): POC Glucose: 114 mg/dl — AB (ref 70–99)

## 2018-10-04 NOTE — Assessment & Plan Note (Signed)
Was on cialis for this Will try stopping--reconsider Rx if worsens

## 2018-10-04 NOTE — Assessment & Plan Note (Signed)
Sometimes associated with low blood pressure and giving out Really sounds like it Ewell be related to cialis Will have him stop [place] need to reconsider other options if symptoms persist

## 2018-10-04 NOTE — Assessment & Plan Note (Addendum)
Will check sugar Was 114 --but not fully fasting Reassured--no diabetes

## 2018-10-04 NOTE — Patient Instructions (Signed)
Please stop the cialis (tadalafil). Let me know if the spells don't go away---or you are having a lot of trouble with your bladder.

## 2018-10-04 NOTE — Progress Notes (Signed)
Subjective:    Patient ID: Randall Reeves, male    DOB: [birth year], 73 y.o.   MRN: [id number]  HPI Here due to some concerns For some time, "I will feel like I have just run out of gas" Notices his BP low at these times (like 116/65)--but not every time Gets dry mouth at times---and "I can't satisfy my thirst with water"--even after 32 ounces. Pepsi/nabs helps though  Had been trying to lose weight--so cut out sweet tea and soda Then the episodes seemed more noticeable  Does take the cialis every day Takes it for trouble with bladder control and ED Now not having sex  Current Outpatient Medications on File Prior to Visit  Medication Sig Dispense Refill  . acetaminophen (TYLENOL) 500 MG tablet Take 1,000 mg by mouth every 8 (eight) hours as needed.    Marland Kitchen aspirin EC 81 MG tablet Take 81 mg by mouth daily.    Marland Kitchen atorvastatin (LIPITOR) 10 MG tablet TAKE 1 TABLET BY MOUTH ONCE DAILY 90 tablet 3  . losartan (COZAAR) 50 MG tablet TAKE 1 TABLET BY MOUTH ONCE DAILY 90 tablet 3  . LUTEIN PO Take 1 tablet by mouth every evening.    . Multiple Vitamin (MULTIVITAMIN) tablet Take 1 tablet by mouth daily.     Marland Kitchen omeprazole (PRILOSEC) 20 MG capsule TAKE 1 CAPSULE BY MOUTH TWICE DAILY (Patient taking differently: Take 20 mg by mouth every morning. ) 180 capsule 3  . tadalafil (CIALIS) 5 MG tablet TAKE 1 TABLET BY MOUTH ONCE A DAY (Patient taking differently: Take 5 mg by mouth every evening. ) 90 tablet 3  . triamcinolone cream (KENALOG) 0.1 % Apply 1 application topically 2 (two) times daily as needed. 45 g 1   No current facility-administered medications on file prior to visit.     Allergies  Allergen Reactions  . Bisoprolol-Hydrochlorothiazide     REACTION: muscle pain, cramps  . Codeine Nausea Only    Past Medical History:  Diagnosis Date  . Allergy   . CKD (chronic kidney disease), stage III ([place])   . Coronary atherosclerosis of unspecified type of vessel, native or graft 2011   MI but  cath benign. Dr Otis Brace  . GERD (gastroesophageal reflux disease)   . Hyperlipidemia   . Hypertension   . Myocardial infarction ([place])   . Obstructive sleep apnea    Cpap as of 27 days ago  . Sleep apnea     Past Surgical History:  Procedure Laterality Date  . CARDIAC CATHETERIZATION    . CARPAL TUNNEL RELEASE Left 02/2013  . INGUINAL HERNIA REPAIR Left 1983   with vasectomy  . SPERMATOCELECTOMY  1998  . VASECTOMY  1983   with inguinal hernia repair    Family History  Problem Relation Age of Onset  . Stroke Mother   . Hypertension Mother   . Diabetes Father   . Asthma Father   . Heart failure Father   . Heart disease Father   . Hypertension Sister   . Diabetes Sister   . Neuropathy Brother   . Hypertension Brother   . Diabetes Brother   . Heart disease Brother   . Prostate cancer Brother   . Hypertension Brother   . Hypertension Brother   . Hypertension Brother   . Colon cancer Neg Hx   . Rectal cancer Neg Hx   . Stomach cancer Neg Hx   . Esophageal cancer Neg Hx     Social History  Socioeconomic History  . Marital status: Married    Spouse name: Not on file  . Number of children: 2  . Years of education: Not on file  . Highest education level: Not on file  Occupational History  . Occupation: Chief Strategy Officer  . Financial resource strain: Not on file  . Food insecurity    Worry: Not on file    Inability: Not on file  . Transportation needs    Medical: Not on file    Non-medical: Not on file  Tobacco Use  . Smoking status: Never Smoker  . Smokeless tobacco: Never Used  Substance and Sexual Activity  . Alcohol use: Yes    Alcohol/week: 3.0 - 5.0 standard drinks    Types: 3 - 5 Cans of beer per week    Comment:  a few beers in a week.  . Drug use: No  . Sexual activity: Not on file  Lifestyle  . Physical activity    Days per week: Not on file    Minutes per session: Not on file  . Stress: Not on file  Relationships  . Social  Herbalist on phone: Not on file    Gets together: Not on file    Attends religious service: Not on file    Active member of club or organization: Not on file    Attends meetings of clubs or organizations: Not on file    Relationship status: Not on file  . Intimate partner violence    Fear of current or ex partner: Not on file    Emotionally abused: Not on file    Physically abused: Not on file    Forced sexual activity: Not on file  Other Topics Concern  . Not on file  Social History Narrative   Has living will   Wife is his health care POA--alternate is son   Would accept resuscitation attempts.   Would leave feeding tube decision to his wife   Review of Systems Sleeps okay--uses the CPAP. Sleep quality is variable Weight actually up a little Some mild mood issues with COVID--but no major issues Some dizziness at times--not vertiginous    Objective:   Physical Exam  Constitutional: He appears well-developed. No distress.  Neck: No thyromegaly present.  Cardiovascular: Normal rate, regular rhythm, normal heart sounds and intact distal pulses. Exam reveals no gallop.  No murmur heard. Respiratory: Effort normal and breath sounds normal. No respiratory distress. He has no wheezes. He has no rales.  GI: Soft. There is no abdominal tenderness.  Musculoskeletal:        General: No edema.  Lymphadenopathy:    He has no cervical adenopathy.  Psychiatric: He has a normal mood and affect. His behavior is normal.           Assessment & Plan:

## 2018-11-09 ENCOUNTER — Ambulatory Visit (INDEPENDENT_AMBULATORY_CARE_PROVIDER_SITE_OTHER): Payer: PPO

## 2018-11-09 DIAGNOSIS — Z23 Encounter for immunization: Secondary | ICD-10-CM | POA: Diagnosis not present

## 2019-02-19 DIAGNOSIS — D2262 Melanocytic nevi of left upper limb, including shoulder: Secondary | ICD-10-CM | POA: Diagnosis not present

## 2019-02-19 DIAGNOSIS — D2261 Melanocytic nevi of right upper limb, including shoulder: Secondary | ICD-10-CM | POA: Diagnosis not present

## 2019-02-19 DIAGNOSIS — X32XXXA Exposure to sunlight, initial encounter: Secondary | ICD-10-CM | POA: Diagnosis not present

## 2019-02-19 DIAGNOSIS — Z85828 Personal history of other malignant neoplasm of skin: Secondary | ICD-10-CM | POA: Diagnosis not present

## 2019-02-19 DIAGNOSIS — L570 Actinic keratosis: Secondary | ICD-10-CM | POA: Diagnosis not present

## 2019-02-19 DIAGNOSIS — D2271 Melanocytic nevi of right lower limb, including hip: Secondary | ICD-10-CM | POA: Diagnosis not present

## 2019-02-27 ENCOUNTER — Telehealth: Payer: Self-pay

## 2019-02-27 NOTE — Telephone Encounter (Signed)
Randall Reeves,  Quick question re: this [place] pt's question.  You had mentioned an issue with anticoagulants and the vaccine. I am not aware of any issue with the COVID vaccine and aspirin. Are you aware of any issue with aspirin?

## 2019-02-27 NOTE — Telephone Encounter (Signed)
[name] DPR signed left v/m that pt is scheduled to get covid vaccine the end of this week. Pt was advised to contact PCP since pt is taking blood thinner, ASA 81 mg daily to verify it is OK for pt to take the covid vaccine. Linda request cb. Dr Silvio Pate out of office until 03/01/19 in afternoon. Will send note to Dr Diona Browner who is in office. Please advise.

## 2019-02-28 NOTE — Telephone Encounter (Signed)
Contacted [name] and advised no contraindication but Rizz[place] need to hold pressure longer after the injection. Advised to lightly hold pressure but do not rub or use a lot of pressure. Advised if any problems to contact this office. [name] verbalized understanding.

## 2019-02-28 NOTE — Telephone Encounter (Signed)
Agreed -

## 2019-02-28 NOTE — Telephone Encounter (Signed)
Aspirin is not a contraindication to the Covid vaccine.  As long as he does not have another contraindication then it should be okay to get vaccinated.  I do not see a contraindication in his history.

## 2019-04-07 ENCOUNTER — Other Ambulatory Visit: Payer: Self-pay | Admitting: Internal Medicine

## 2019-05-15 ENCOUNTER — Ambulatory Visit (INDEPENDENT_AMBULATORY_CARE_PROVIDER_SITE_OTHER): Payer: PPO | Admitting: Internal Medicine

## 2019-05-15 ENCOUNTER — Other Ambulatory Visit: Payer: Self-pay

## 2019-05-15 ENCOUNTER — Encounter: Payer: Self-pay | Admitting: Internal Medicine

## 2019-05-15 VITALS — BP 116/84 | HR 76 | Temp 97.7°F | Ht 68.25 in | Wt 195.0 lb

## 2019-05-15 DIAGNOSIS — N401 Enlarged prostate with lower urinary tract symptoms: Secondary | ICD-10-CM

## 2019-05-15 DIAGNOSIS — Z7189 Other specified counseling: Secondary | ICD-10-CM | POA: Diagnosis not present

## 2019-05-15 DIAGNOSIS — Z0000 Encounter for general adult medical examination without abnormal findings: Secondary | ICD-10-CM

## 2019-05-15 DIAGNOSIS — N138 Other obstructive and reflux uropathy: Secondary | ICD-10-CM

## 2019-05-15 DIAGNOSIS — I10 Essential (primary) hypertension: Secondary | ICD-10-CM | POA: Diagnosis not present

## 2019-05-15 DIAGNOSIS — I722 Aneurysm of renal artery: Secondary | ICD-10-CM

## 2019-05-15 DIAGNOSIS — R200 Anesthesia of skin: Secondary | ICD-10-CM | POA: Diagnosis not present

## 2019-05-15 DIAGNOSIS — I728 Aneurysm of other specified arteries: Secondary | ICD-10-CM | POA: Diagnosis not present

## 2019-05-15 DIAGNOSIS — N1831 Chronic kidney disease, stage 3a: Secondary | ICD-10-CM | POA: Diagnosis not present

## 2019-05-15 LAB — LIPID PANEL
Cholesterol: 148 mg/dL (ref 0–200)
HDL: 52.2 mg/dL (ref >39.00)
LDL Cholesterol: 74 mg/dL (ref 0–99)
NonHDL: 95.57
Total CHOL/HDL Ratio: 3
Triglycerides: 107 mg/dL (ref 0.0–149.0)
VLDL: 21.4 mg/dL (ref 0.0–40.0)

## 2019-05-15 LAB — VITAMIN B12: Vitamin B-12: 612 pg/mL (ref 211–911)

## 2019-05-15 LAB — COMPREHENSIVE METABOLIC PANEL
ALT: 21 U/L (ref 0–53)
AST: 19 U/L (ref 0–37)
Albumin: 4.4 g/dL (ref 3.5–5.2)
Alkaline Phosphatase: 78 U/L (ref 39–117)
BUN: 28 mg/dL — ABNORMAL HIGH (ref 6–23)
CO2: 31 mEq/L (ref 19–32)
Calcium: 9.6 mg/dL (ref 8.4–10.5)
Chloride: 101 mEq/L (ref 96–112)
Creatinine, Ser: 1.33 mg/dL (ref 0.40–1.50)
GFR: 52.91 mL/min — ABNORMAL LOW (ref >60.00)
Glucose, Bld: 96 mg/dL (ref 70–99)
Potassium: 4.3 mEq/L (ref 3.5–5.1)
Sodium: 137 mEq/L (ref 135–145)
Total Bilirubin: 0.7 mg/dL (ref 0.2–1.2)
Total Protein: 7.4 g/dL (ref 6.0–8.3)

## 2019-05-15 LAB — CBC
HCT: 45.3 % (ref 39.0–52.0)
Hemoglobin: 15.3 g/dL (ref 13.0–17.0)
MCHC: 33.7 g/dL (ref 30.0–36.0)
MCV: 95.3 fl (ref 78.0–100.0)
Platelets: 221 10*3/uL (ref 150.0–400.0)
RBC: 4.75 Mil/uL (ref 4.22–5.81)
RDW: 13.2 % (ref 11.5–15.5)
WBC: 4.9 10*3/uL (ref 4.0–10.5)

## 2019-05-15 LAB — T4, FREE: Free T4: 0.63 ng/dL (ref 0.60–1.60)

## 2019-05-15 LAB — HEMOGLOBIN A1C: Hgb A1c MFr Bld: 5.9 % (ref 4.6–6.5)

## 2019-05-15 MED ORDER — TADALAFIL 20 MG PO TABS: 10.0000 mg | ORAL_TABLET | ORAL | 11 refills | Status: DC | PRN

## 2019-05-15 MED ORDER — SILODOSIN 4 MG PO CAPS: 4.0000 mg | ORAL_CAPSULE | Freq: Every day | ORAL | 3 refills | Status: DC

## 2019-05-15 NOTE — Progress Notes (Signed)
Hearing Screening   Method: Audiometry   125Hz  250Hz  500Hz  1000Hz  2000Hz  3000Hz  4000Hz  6000Hz  8000Hz   Right ear:   20 20 20   0    Left ear:   20 20 20   0    Vision Screening Comments: June 2020

## 2019-05-15 NOTE — Assessment & Plan Note (Signed)
Numbness in feet and now hands Will check labs Trial off statin if no answers

## 2019-05-15 NOTE — Assessment & Plan Note (Signed)
Vascular re-evaulation in 2023 On statin, ASA

## 2019-05-15 NOTE — Assessment & Plan Note (Signed)
Mild Continues on losartan

## 2019-05-15 NOTE — Patient Instructions (Signed)
If the labs don't show the cause of the numbness, try off the atorvastatin for a month to see if that helps. If it doesn't, restart it. If it does, let me know and I will start you on a different one.

## 2019-05-15 NOTE — Assessment & Plan Note (Signed)
See social history 

## 2019-05-15 NOTE — Progress Notes (Signed)
Subjective:    Patient ID: Randall Reeves, male    DOB: [birth year], 74 y.o.   MRN: [id number]  HPI Here for Medicare wellness visit and follow up of chronic health conditions This visit occurred during the SARS-CoV-2 public health emergency.  Safety protocols were in place, including screening questions prior to the visit, additional usage of staff PPE, and extensive cleaning of exam room while observing appropriate contact time as indicated for disinfecting solutions.   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 acetaminophen (TYLENOL) 500 MG tablet Take 1,000 mg by mouth every 8 (eight) hours as needed.    Marland Kitchen aspirin EC 81 MG tablet Take 81 mg by mouth daily.    Marland Kitchen atorvastatin (LIPITOR) 10 MG tablet TAKE 1 TABLET BY MOUTH ONCE DAILY 90 tablet 3  . losartan (COZAAR) 50 MG tablet TAKE 1 TABLET BY MOUTH ONCE DAILY 90 tablet 3  . LUTEIN PO Take 1 tablet by mouth every evening.    . Multiple Vitamin (MULTIVITAMIN) tablet Take 1 tablet by mouth daily.     Marland Kitchen omeprazole (PRILOSEC) 20 MG capsule TAKE  1 CAPSULE BY MOUTH TWICE DAILY 180 capsule 3  . triamcinolone cream (KENALOG) 0.1 % Apply 1 application topically 2 (two) times daily as needed. 45 g 1   No current facility-administered medications on file prior to visit.    Allergies  Allergen Reactions  . Bisoprolol-Hydrochlorothiazide     REACTION: muscle pain, cramps  . Codeine Nausea Only    Past Medical History:  Diagnosis Date  . Allergy   . CKD (chronic kidney disease), stage III   . Coronary atherosclerosis of unspecified type of vessel, native or graft 2011   MI but cath benign. Dr Otis Brace  . GERD (gastroesophageal reflux disease)   . Hyperlipidemia   . Hypertension   . Myocardial infarction ([place])   . Obstructive sleep apnea    Cpap as of 27 days ago  . Sleep apnea     Past Surgical History:  Procedure Laterality Date  . CARDIAC CATHETERIZATION    . CARPAL TUNNEL RELEASE Left 02/2013  . INGUINAL HERNIA REPAIR Left 1983   with vasectomy  . SPERMATOCELECTOMY  1998  . VASECTOMY  1983   with inguinal hernia repair    Family History  Problem Relation Age of Onset  . Stroke Mother   . Hypertension Mother   . Diabetes Father   . Asthma Father   . Heart failure Father   . Heart disease Father   . Hypertension Sister   . Diabetes Sister   . Neuropathy Brother   . Hypertension Brother   .  Diabetes Brother   . Heart disease Brother   . Prostate cancer Brother   . Hypertension Brother   . Hypertension Brother   . Hypertension Brother   . Colon cancer Neg Hx   . Rectal cancer Neg Hx   . Stomach cancer Neg Hx   . Esophageal cancer Neg Hx     Social History   Socioeconomic History  . Marital status: Married    Spouse name: Not on file  . Number of children: 2  . Years of education: Not on file  . Highest education level: Not on file  Occupational History  . Occupation: Biomedical scientist  Tobacco Use  . Smoking status: Never Smoker  . Smokeless tobacco: Never Used  Substance and Sexual Activity  .  Alcohol use: Yes    Alcohol/week: 3.0 - 5.0 standard drinks    Types: 3 - 5 Cans of beer per week    Comment:  a few beers in a week.  . Drug use: No  . Sexual activity: Not on file  Other Topics Concern  . Not on file  Social History Narrative   Has living will   Wife is his health care POA--alternate is son   Would accept resuscitation attempts.   Would leave feeding tube decision to his wife   Social Determinants of Health   Financial Resource Strain:   . Difficulty of Paying Living Expenses:   Food Insecurity:   . Worried About Charity fundraiser in the Last Year:   . Arboriculturist in the Last Year:   Transportation Needs:   . Film/video editor (Medical):   Marland Kitchen Lack of Transportation (Non-Medical):   Physical Activity:   . Days of Exercise per Week:   . Minutes of Exercise per Session:   Stress:   . Feeling of Stress :   Social Connections:   . Frequency of Communication with Friends and Family:   . Frequency of Social Gatherings with Friends and Family:   . Attends Religious Services:   . Active Member of Clubs or Organizations:   . Attends Archivist Meetings:   Marland Kitchen Marital Status:   Intimate Partner Violence:   . Fear of Current or Ex-Partner:   . Emotionally Abused:   Marland Kitchen Physically Abused:   . Sexually Abused:    Review of Systems Sleep at night is variable---not good if he naps over 30 minutes. Does use the CPAP most nights (misses occasionally if he has a troubled night) Appetite is fine Weight is stable over time Wears seat belt Teeth okay--keeps up with dentist No heartburn or dysphagia--on the omeprazole daily Bowels are regular --seems to be pasty. Occasional blood on the paper Voids okay--some urgency and frequency. Would like to try something for this Still with numbness in hands and feet--notices after he sits down to rest    Objective:   Physical Exam  Constitutional: He is oriented to person, place, and time. He appears  well-developed. No distress.  HENT:  Mouth/Throat: Oropharynx is clear and moist.  No oral lesions  Neck: No thyromegaly present.  Cardiovascular: Normal rate, regular rhythm, normal heart sounds and intact distal pulses. Exam reveals no gallop.  No murmur heard. Respiratory: Effort normal and breath sounds normal. No respiratory distress. He has no wheezes. He has no rales.  GI: Soft. There is no abdominal tenderness.  Musculoskeletal:        General: No tenderness or edema.  Lymphadenopathy:    He  has no cervical adenopathy.  Neurological: He is alert and oriented to person, place, and time.  President--- "Zoila Shutter, Obama" [phone] [id number] Recall 2/3  Skin: No rash noted. No erythema.  Psychiatric: He has a normal mood and affect. His behavior is normal.           Assessment & Plan:

## 2019-05-15 NOTE — Assessment & Plan Note (Signed)
Increased symptoms Will try silodosin

## 2019-05-15 NOTE — Assessment & Plan Note (Signed)
Will follow up with vascular in 2023 On ASA, statin

## 2019-05-15 NOTE — Assessment & Plan Note (Signed)
BP Readings from Last 3 Encounters:  05/15/19 116/84  10/04/18 132/82  07/03/18 100/77   Good control

## 2019-05-15 NOTE — Assessment & Plan Note (Signed)
I have personally reviewed the Medicare Annual Wellness questionnaire and have noted 1. The patient's medical and social history 2. Their use of alcohol, tobacco or illicit drugs 3. Their current medications and supplements 4. The patient's functional ability including ADL's, fall risks, home safety risks and hearing or visual             impairment. 5. Diet and physical activities 6. Evidence for depression or mood disorders  The patients weight, height, BMI and visual acuity have been recorded in the chart I have made referrals, counseling and provided education to the patient based review of the above and I have provided the pt with a written personalized care plan for preventive services.  I have provided you with a copy of your personalized plan for preventive services. Please take the time to review along with your updated medication list.  Colon due in June again No PSA due to age Had COVID vaccine Flu vaccine in the fall Discussed fitness

## 2019-05-31 ENCOUNTER — Other Ambulatory Visit: Payer: Self-pay | Admitting: Internal Medicine

## 2019-07-13 ENCOUNTER — Encounter: Payer: Self-pay | Admitting: Gastroenterology

## 2019-08-27 ENCOUNTER — Encounter: Payer: Self-pay | Admitting: Gastroenterology

## 2019-08-27 ENCOUNTER — Other Ambulatory Visit: Payer: Self-pay

## 2019-08-27 ENCOUNTER — Ambulatory Visit (AMBULATORY_SURGERY_CENTER): Payer: Self-pay | Admitting: *Deleted

## 2019-08-27 VITALS — Ht 68.25 in | Wt 201.0 lb

## 2019-08-27 DIAGNOSIS — Z86010 Personal history of colonic polyps: Secondary | ICD-10-CM

## 2019-08-27 MED ORDER — PEG 3350-KCL-NA BICARB-NACL 420 G PO SOLR: 4000.0000 mL | Freq: Once | ORAL | 0 refills | Status: AC

## 2019-08-27 NOTE — Progress Notes (Signed)
04-22-19 cov vacc x 2  No egg or soy allergy known to patient  No issues with past sedation with any surgeries or procedures- no FH of malignant hyperthermia  no intubation problems in the past  No diet pills per patient No home 02 use per patient  No blood thinners per patient  Pt denies issues with constipation  No A fib or A flutter  EMMI video to pt or MyChart  COVID 19 guidelines implemented in PV today   1 sibling with colon polyps-  Golytely prep per HD- Pt's pharmacy has Nulytely in [place]    Due to the COVID-19 pandemic we are asking patients to follow these guidelines. Please only bring one care partner. Please be aware that your care partner Rozas wait in the car in the parking lot or if they feel like they will be too hot to wait in the car, they [place] wait in the lobby on the 4th floor. All care partners are required to wear a mask the entire time (we do not have any that we can provide them), they need to practice social distancing, and we will do a Covid check for all patient's and care partners when you arrive. Also we will check their temperature and your temperature. If the care partner waits in their car they need to stay in the parking lot the entire time and we will call them on their cell phone when the patient is ready for discharge so they can bring the car to the front of the building. Also all patient's will need to wear a mask into building.

## 2019-09-03 ENCOUNTER — Telehealth: Payer: Self-pay | Admitting: Gastroenterology

## 2019-09-03 DIAGNOSIS — Z86010 Personal history of colonic polyps: Secondary | ICD-10-CM

## 2019-09-03 DIAGNOSIS — H0014 Chalazion left upper eyelid: Secondary | ICD-10-CM | POA: Diagnosis not present

## 2019-09-03 MED ORDER — PEG 3350-KCL-NA BICARB-NACL 420 G PO SOLR: 4000.0000 mL | Freq: Once | ORAL | 0 refills | Status: AC

## 2019-09-03 NOTE — Telephone Encounter (Signed)
[facility] in [place] does have Golytely available.  I called this RX into Sarah at [facility]  Pt is aware and will pick up RX ASAP

## 2019-09-10 ENCOUNTER — Encounter: Payer: Self-pay | Admitting: Gastroenterology

## 2019-09-10 ENCOUNTER — Other Ambulatory Visit: Payer: Self-pay

## 2019-09-10 ENCOUNTER — Ambulatory Visit (AMBULATORY_SURGERY_CENTER): Payer: PPO | Admitting: Gastroenterology

## 2019-09-10 VITALS — BP 107/81 | HR 73 | Temp 98.0°F | Resp 18 | Ht 68.0 in | Wt 201.0 lb

## 2019-09-10 DIAGNOSIS — D122 Benign neoplasm of ascending colon: Secondary | ICD-10-CM

## 2019-09-10 DIAGNOSIS — Z86010 Personal history of colonic polyps: Secondary | ICD-10-CM

## 2019-09-10 DIAGNOSIS — D123 Benign neoplasm of transverse colon: Secondary | ICD-10-CM

## 2019-09-10 DIAGNOSIS — K635 Polyp of colon: Secondary | ICD-10-CM

## 2019-09-10 MED ORDER — SODIUM CHLORIDE 0.9 % IV SOLN: 500.0000 mL | Freq: Once | INTRAVENOUS | Status: DC

## 2019-09-10 NOTE — Progress Notes (Signed)
Called to room to assist during endoscopic procedure.  Patient ID and intended procedure confirmed with present staff. Received instructions for my participation in the procedure from the performing physician.  

## 2019-09-10 NOTE — Op Note (Signed)
[facility] Patient Name: Randall Reeves Procedure Date: 09/10/2019 7:43 AM MRN: [id number] Endoscopist: Mallie Mussel L. Loletha Carrow , MD Age: 74 Referring MD:  Date of Birth: 16-Mar-1945 Gender: Male Account #: [id number] Procedure:                Colonoscopy Indications:              Increased risk colon cancer surveillance: Personal                            history of sessile serrated colon polyp (10 mm or                            greater in size) - right colon SSP x 3 06/2018,                            including > 47mm; Multiple SSP 04/2017, one                            requiring piecemeal resection Medicines:                Monitored Anesthesia Care Procedure:                Pre-Anesthesia Assessment:                           - Prior to the procedure, a History and Physical                            was performed, and patient medications and                            allergies were reviewed. The patient's tolerance of                            previous anesthesia was also reviewed. The risks                            and benefits of the procedure and the sedation                            options and risks were discussed with the patient.                            All questions were answered, and informed consent                            was obtained. Prior Anticoagulants: The patient has                            taken no previous anticoagulant or antiplatelet                            agents except for aspirin. ASA Grade Assessment:  III - A patient with severe systemic disease. After                            reviewing the risks and benefits, the patient was                            deemed in satisfactory condition to undergo the                            procedure.                           After obtaining informed consent, the colonoscope                            was passed under direct vision. Throughout the                             procedure, the patient's blood pressure, pulse, and                            oxygen saturations were monitored continuously. The                            Colonoscope was introduced through the anus and                            advanced to the the cecum, identified by                            appendiceal orifice and ileocecal valve. The                            colonoscopy was performed without difficulty. The                            patient tolerated the procedure well. The quality                            of the bowel preparation was good. The ileocecal                            valve, appendiceal orifice, and rectum were                            photographed. The bowel preparation used was                            GoLYTELY via split dose instruction. Scope In: 8:06:37 AM Scope Out: 8:25:00 AM Scope Withdrawal Time: 0 hours 12 minutes 9 seconds  Total Procedure Duration: 0 hours 18 minutes 23 seconds  Findings:                 The perianal and digital rectal examinations were  normal.                           A diminutive polyp was found in the distal                            ascending colon. The polyp was flat. The polyp was                            removed with a cold biopsy forceps. Resection and                            retrieval were complete.                           A 6-8 mm polyp was found in the proximal transverse                            colon. The polyp was sessile. The polyp was removed                            with a cold snare. Resection and retrieval were                            complete.                           A few small-mouthed diverticula were found in the                            left colon.                           The exam was otherwise without abnormality on                            direct and retroflexion views. Complications:            No immediate complications. Estimated Blood Loss:      Estimated blood loss was minimal. Impression:               - One diminutive polyp in the distal ascending                            colon, removed with a cold biopsy forceps. Resected                            and retrieved.                           - One 6-8 mm polyp in the proximal transverse                            colon, removed with a cold snare. Resected and  retrieved.                           - Diverticulosis in the left colon.                           - The examination was otherwise normal on direct                            and retroflexion views. Recommendation:           - Patient has a contact number available for                            emergencies. The signs and symptoms of potential                            delayed complications were discussed with the                            patient. Return to normal activities tomorrow.                            Written discharge instructions were provided to the                            patient.                           - Resume previous diet.                           - Continue present medications.                           - Await pathology results.                           - Repeat colonoscopy is recommended for                            surveillance. The colonoscopy date will be                            determined after pathology results from today's                            exam become available for review. Ambrea Hegler L. Loletha Carrow, MD 09/10/2019 8:32:25 AM This report has been signed electronically.

## 2019-09-10 NOTE — Progress Notes (Signed)
To PACU, VSS. Report to [email or web address] 

## 2019-09-10 NOTE — Progress Notes (Signed)
CW vitals and SM IV.

## 2019-09-10 NOTE — Progress Notes (Signed)
Pt has been diagnosed with a stigh on his left eye and has completed the course of PCN.

## 2019-09-10 NOTE — Patient Instructions (Signed)
Information on polyps and diverticulosis given to you today.  Await pathology results.  Resume previous diet and medications.  YOU HAD AN ENDOSCOPIC PROCEDURE TODAY AT THE [facility]:   Refer to the procedure report that was given to you for any specific questions about what was found during the examination.  If the procedure report does not answer your questions, please call your gastroenterologist to clarify.  If you requested that your care partner not be given the details of your procedure findings, then the procedure report has been included in a sealed envelope for you to review at your convenience later.  YOU SHOULD EXPECT: Some feelings of bloating in the abdomen. Passage of more gas than usual.  Walking can help get rid of the air that was put into your GI tract during the procedure and reduce the bloating. If you had a lower endoscopy (such as a colonoscopy or flexible sigmoidoscopy) you Rede notice spotting of blood in your stool or on the toilet paper. If you underwent a bowel prep for your procedure, you [place] not have a normal bowel movement for a few days.  Please Note:  You might notice some irritation and congestion in your nose or some drainage.  This is from the oxygen used during your procedure.  There is no need for concern and it should clear up in a day or so.  SYMPTOMS TO REPORT IMMEDIATELY:   Following lower endoscopy (colonoscopy or flexible sigmoidoscopy):  Excessive amounts of blood in the stool  Significant tenderness or worsening of abdominal pains  Swelling of the abdomen that is new, acute  Fever of 100F or higher   For urgent or emergent issues, a gastroenterologist can be reached at any hour by calling [phone]. Do not use MyChart messaging for urgent concerns.    DIET:  We do recommend a small meal at first, but then you [place] proceed to your regular diet.  Drink plenty of fluids but you should avoid alcoholic beverages for 24  hours.  ACTIVITY:  You should plan to take it easy for the rest of today and you should NOT DRIVE or use heavy machinery until tomorrow (because of the sedation medicines used during the test).    FOLLOW UP: Our staff will call the number listed on your records 48-72 hours following your procedure to check on you and address any questions or concerns that you [place] have regarding the information given to you following your procedure. If we do not reach you, we will leave a message.  We will attempt to reach you two times.  During this call, we will ask if you have developed any symptoms of COVID 19. If you develop any symptoms (ie: fever, flu-like symptoms, shortness of breath, cough etc.) before then, please call [phone].  If you test positive for Covid 19 in the 2 weeks post procedure, please call and report this information to us.    If any biopsies were taken you will be contacted by phone or by letter within the next 1-3 weeks.  Please call us at [phone] if you have not heard about the biopsies in 3 weeks.    SIGNATURES/CONFIDENTIALITY: You and/or your care partner have signed paperwork which will be entered into your electronic medical record.  These signatures attest to the fact that that the information above on your After Visit Summary has been reviewed and is understood.  Full responsibility of the confidentiality of this discharge information lies with you and/or   your care-partner. 

## 2019-09-12 ENCOUNTER — Telehealth: Payer: Self-pay | Admitting: *Deleted

## 2019-09-12 ENCOUNTER — Encounter: Payer: Self-pay | Admitting: Gastroenterology

## 2019-09-12 NOTE — Telephone Encounter (Signed)
Left message on f/u call 

## 2019-09-19 DIAGNOSIS — H2513 Age-related nuclear cataract, bilateral: Secondary | ICD-10-CM | POA: Diagnosis not present

## 2019-09-21 DIAGNOSIS — G4733 Obstructive sleep apnea (adult) (pediatric): Secondary | ICD-10-CM | POA: Diagnosis not present

## 2019-09-26 DIAGNOSIS — H0014 Chalazion left upper eyelid: Secondary | ICD-10-CM | POA: Diagnosis not present

## 2019-10-05 ENCOUNTER — Telehealth: Payer: Self-pay | Admitting: Pulmonary Disease

## 2019-10-05 NOTE — Telephone Encounter (Signed)
CPAP download from 09/05/19 to 10/04/19 >> used on 20 of 30 nights with average 5 hrs 49 min. Average AHI 5.5 with median CPAP 8 and 95 th percentile CPAP 11 cm H2O.  No air leak.   Please let him know that CPAP download shows good control of sleep apnea with current settings.  He should keep scheduled appointment with Beth to further assess his issues with CPAP.

## 2019-10-05 NOTE — Telephone Encounter (Signed)
Spoke with patient, states he did not wear his CPAP for 10 days while his wife was in the hospital and now it is not working correctly.  He has contacted [employer] and followed their instructions and still feels like he is not getting enough air.  He has not been seen in the office since 2019, scheduled patient to see Beth on Tuesday 9/14 at [place] printed and taken to Dr. Halford Chessman for review.  Advised I would call the patient back if Dr. Halford Chessman wanted to make any changes in his settings between now and next Tuesday.  Dr. Halford Chessman, please advise if you want any setting changes prior to his f/u with [facility] next week.  Thank you.

## 2019-10-05 NOTE — Telephone Encounter (Signed)
Called and spoke with patient and advised him of Dr. Juanetta Gosling recommendations.  Patient to keep his appointment with [facility] on 9/14.  Nothing further needed.

## 2019-10-09 ENCOUNTER — Ambulatory Visit: Payer: PPO | Admitting: Primary Care

## 2019-10-10 ENCOUNTER — Ambulatory Visit: Payer: PPO | Admitting: Acute Care

## 2019-10-10 ENCOUNTER — Other Ambulatory Visit: Payer: Self-pay

## 2019-10-10 ENCOUNTER — Encounter: Payer: Self-pay | Admitting: Acute Care

## 2019-10-10 VITALS — BP 110/70 | HR 87 | Temp 97.1°F | Wt 197.0 lb

## 2019-10-10 DIAGNOSIS — G4733 Obstructive sleep apnea (adult) (pediatric): Secondary | ICD-10-CM | POA: Diagnosis not present

## 2019-10-10 NOTE — Progress Notes (Signed)
History of Present Illness Randall Reeves is a 74 y.o. male with OSA on CPAP.    10/10/2019  Pt. Presents for CPAP follow up. He states he has been compliant with his CPAP therapy. His wife was in the hospital, and he had a period of 10 days this month  where he did not use his therapy. He states when he is out of his routine he falls asleep in a chair and forgets to wear his CPAP.Marland Kitchen He had not bought new equipment in a long time, so 3 weeks ago he did get new supplies. He does have a mask leak at times. He states he has noted it is better with the new supplies. He states he does use a so clean machine . He sleeps in a recliner , as he needs the head of his bed raised for his acid reflux. Marland Kitchen He is going to try to get an adjustable bed.  He has not had daytime sleepiness. He awakens on average of 1-2 times a night.  No early morning headaches. We discussed the need to wear his CPAP therapy every night. He denies fever, chest pain, orthopnea or hemoptysis.  Test Results: Sleep tests: PSG 10/15/04 >> RDI 22, SpO2 low 85% HST 04/05/17 >> AHI 66.3, SaO2 low 81% Auto CPAP 05/28/17 to 06/26/17 >> used on 30 of 30 nights with average 6 hrs 53 min.  Average AHI 3 with median CPAP 7 and 95 th percentile CPAP 10 cm H2O  Cardiac tests: Echo 12/29/15 >> EF 55 to 60%, grade 2 DD          CBC Latest Ref Rng & Units 05/15/2019 04/23/2018 04/22/2018  WBC 4.0 - 10.5 K/uL 4.9 8.2 15.8(H)  Hemoglobin 13.0 - 17.0 g/dL 15.3 12.4(L) 13.9  Hematocrit 39 - 52 % 45.3 37.9(L) 41.9  Platelets 150 - 400 K/uL 221.0 176 217    BMP Latest Ref Rng & Units 05/15/2019 04/23/2018 04/22/2018  Glucose 70 - 99 mg/dL 96 116(H) 228(H)  BUN 6 - 23 mg/dL 28(H) 27(H) 33(H)  Creatinine 0.40 - 1.50 mg/dL 1.33 1.32(H) 1.71(H)  Sodium 135 - 145 mEq/L 137 137 135  Potassium 3.5 - 5.1 mEq/L 4.3 3.6 3.9  Chloride 96 - 112 mEq/L 101 107 106  CO2 19 - 32 mEq/L 31 20(L) 18(L)  Calcium 8.4 - 10.5 mg/dL 9.6 8.3(L) 9.3    BNP No results  found for: BNP  ProBNP No results found for: PROBNP  PFT No results found for: FEV1PRE, FEV1POST, FVCPRE, FVCPOST, TLC, DLCOUNC, PREFEV1FVCRT, PSTFEV1FVCRT  No results found.   Past medical hx Past Medical History:  Diagnosis Date  . Allergy   . Arthritis   . CKD (chronic kidney disease), stage III   . Coronary atherosclerosis of unspecified type of vessel, native or graft 2011   MI but cath benign. Dr Otis Brace  . GERD (gastroesophageal reflux disease)   . Hyperlipidemia   . Hypertension   . Myocardial infarction ([place]) 2015  . Obstructive sleep apnea    Cpap as of 27 days ago  . Sleep apnea    wears cpap   . Syncope    due to hypotension from BP meds      Social History   Tobacco Use  . Smoking status: Never Smoker  . Smokeless tobacco: Never Used  Vaping Use  . Vaping Use: Never used  Substance Use Topics  . Alcohol use: Yes    Alcohol/week: 3.0 - 5.0 standard drinks  Types: 3 - 5 Cans of beer per week    Comment:  a few beers in a week.  . Drug use: No    Mr.Dwyer reports that he has never smoked. He has never used smokeless tobacco. He reports current alcohol use of about 3.0 - 5.0 standard drinks of alcohol per week. He reports that he does not use drugs.  Tobacco Cessation: Never Smoker  Past surgical hx, Family hx, Social hx all reviewed.  Current Outpatient Medications on File Prior to Visit  Medication Sig  . acetaminophen (TYLENOL) 500 MG tablet Take 1,000 mg by mouth every 8 (eight) hours as needed.  Marland Kitchen aspirin EC 81 MG tablet Take 81 mg by mouth daily.  Marland Kitchen losartan (COZAAR) 50 MG tablet TAKE 1 TABLET BY MOUTH ONCE DAILY  . LUTEIN PO Take 1 tablet by mouth every evening.  . Multiple Vitamin (MULTIVITAMIN) tablet Take 1 tablet by mouth daily.   Marland Kitchen omeprazole (PRILOSEC) 20 MG capsule TAKE 1 CAPSULE BY MOUTH TWICE DAILY  . silodosin (RAPAFLO) 4 MG CAPS capsule Take 1 capsule (4 mg total) by mouth daily with breakfast.  . tadalafil (CIALIS) 20 MG tablet  Take 0.5-1 tablets (10-20 mg total) by mouth every other day as needed for erectile dysfunction.  . triamcinolone cream (KENALOG) 0.1 % Apply 1 application topically 2 (two) times daily as needed.  Marland Kitchen atorvastatin (LIPITOR) 10 MG tablet TAKE 1 TABLET BY MOUTH ONCE DAILY (Patient not taking: Reported on 10/10/2019)   No current facility-administered medications on file prior to visit.     Allergies  Allergen Reactions  . Bisoprolol-Hydrochlorothiazide     REACTION: muscle pain, cramps  . Codeine Nausea Only  . Imipramine Other (See Comments)    Dry mouth, dizziness, headaches, increased sweating   . Tamsulosin Other (See Comments)    Dizziness     Review Of Systems:  Constitutional:   No  weight loss, night sweats,  Fevers, chills, fatigue, or  lassitude.  HEENT:   No headaches,  Difficulty swallowing,  Tooth/dental problems, or  Sore throat,                No sneezing, itching, ear ache, nasal congestion, post nasal drip,   CV:  No chest pain,  Orthopnea, PND, swelling in lower extremities, anasarca, dizziness, palpitations, syncope.   GI  No heartburn, indigestion, abdominal pain, nausea, vomiting, diarrhea, change in bowel habits, loss of appetite, bloody stools.   Resp: No shortness of breath with exertion or at rest.  No excess mucus, no productive cough,  No non-productive cough,  No coughing up of blood.  No change in color of mucus.  No wheezing.  No chest wall deformity  Skin: no rash or lesions.  GU: no dysuria, change in color of urine, no urgency or frequency.  No flank pain, no hematuria   MS:  No joint pain or swelling.  No decreased range of motion.  No back pain.  Psych:  No change in mood or affect. No depression or anxiety.  No memory loss.   Vital Signs BP 110/70 (BP Location: Left Arm, Cuff Size: Normal)   Pulse 87   Temp (!) 97.1 F (36.2 C) (Oral)   Wt 197 lb (89.4 kg)   SpO2 96%   BMI 29.95 kg/m    Physical Exam:  General- No distress,  A&Ox3,  pleasant ENT: No sinus tenderness, TM clear, pale nasal mucosa, no oral exudate,no post nasal drip, no LAN Cardiac: S1, S2,  regular rate and rhythm, no murmur Chest: No wheeze/ rales/ dullness; no accessory muscle use, no nasal flaring, no sternal retractions Abd.: Soft Non-tender, ND, BS +, Body mass index is 29.95 kg/m. Ext: No clubbing cyanosis, edema Neuro:  normal strength, MAE x 4, A&O x 3 Skin: No rashes, warm and dry, no lesions Psych: normal mood and behavior   Assessment/Plan OSA on CPAP Needs better compliance Good control when compliant Plan Please work on wearing your CPAP every night Continue on CPAP at bedtime. You appear to be benefiting from the treatment  Goal is to wear for at least 6 hours each night for maximal clinical benefit. Continue to work on weight loss, as the link between excess weight  and sleep apnea is well established.   Remember to establish a good bedtime routine, and work on sleep hygiene.  Limit daytime naps , avoid stimulants such as caffeine and nicotine close to bedtime, exercise daily to promote sleep quality, avoid heavy , spicy, fried , or rich foods before bed. Ensure adequate exposure to natural light during the day,establish a relaxing bedtime routine with a pleasant sleep environment ( Bedroom between 60 and 67 degrees, turn off bright lights , TV or device screens screens , consider black out curtains or white noise machines) Do not drive if sleepy. Remember to clean mask, tubing, filter, and reservoir once weekly with soapy water.  Please replace equipment frequently to ensure optimal  functioning of CPAP device Follow up with Dr. Halford Chessman   In 6 months  or before as needed with Down Load.    Magdalen Spatz, NP 10/10/2019  4:58 PM

## 2019-10-10 NOTE — Patient Instructions (Addendum)
It is good to see you today. Keep up the good work with your CPAP. Continue on CPAP at bedtime. You appear to be benefiting from the treatment  Goal is to wear for at least 6 hours each night for maximal clinical benefit. Continue to work on weight loss, as the link between excess weight  and sleep apnea is well established.   Remember to establish a good bedtime routine, and work on sleep hygiene.  Limit daytime naps , avoid stimulants such as caffeine and nicotine close to bedtime, exercise daily to promote sleep quality, avoid heavy , spicy, fried , or rich foods before bed. Ensure adequate exposure to natural light during the day,establish a relaxing bedtime routine with a pleasant sleep environment ( Bedroom between 60 and 67 degrees, turn off bright lights , TV or device screens screens , consider black out curtains or white noise machines) Do not drive if sleepy. Remember to clean mask, tubing, filter, and reservoir once weekly with soapy water.  Follow up with Dr. Halford Chessman   In 6 months  or before as needed.

## 2019-10-11 NOTE — Progress Notes (Signed)
Reviewed and agree with assessment/plan.   Chesley Mires, MD [facility] Pulmonary/Critical Care 10/11/2019, 8:31 AM Pager:  [phone]

## 2019-12-24 DIAGNOSIS — G4733 Obstructive sleep apnea (adult) (pediatric): Secondary | ICD-10-CM | POA: Diagnosis not present

## 2019-12-28 DIAGNOSIS — Z1152 Encounter for screening for COVID-19: Secondary | ICD-10-CM | POA: Diagnosis not present

## 2019-12-28 DIAGNOSIS — Z03818 Encounter for observation for suspected exposure to other biological agents ruled out: Secondary | ICD-10-CM | POA: Diagnosis not present

## 2019-12-31 ENCOUNTER — Telehealth: Payer: Self-pay

## 2019-12-31 NOTE — Telephone Encounter (Signed)
[facility] - Client TELEPHONE ADVICE RECORD [facility] Patient Name: Randall Reeves Gender: Male DOB: 1946/01/02 Age: 74 Y 2 M 20 D Return Phone Number: [phone] (Primary), [phone] (Secondary) [place] [address] Client [facility] - Client Client Site [place] - Day Physician Viviana Simpler- MD Contact Type Call Who Is Calling Patient / Member / Family / Caregiver Call Type Triage / Clinical Relationship To Patient Self Return Phone Number [phone] (Primary) Chief Complaint Heart palpitations or irregular heartbeat Reason for Call Symptomatic / Request for [place] states his heart rate is beating faster, when his BP drops. 142/91.HR-76. Translation No Nurse Assessment Nurse: Thad Ranger, RN, Denise Date/Time (Eastern Time): 12/31/2019 10:53:49 AM Confirm and document reason for call. If symptomatic, describe symptoms. ---142/91 HR-76 currently but on Sat and a couple times over the last month, his BP has dropped to 100/65 and HR was up to 140. States he felt very odd at that time. The event lasted over an hour. He gets very weak, unsteady, and SOB. Does the patient have any new or worsening symptoms? ---Yes Will a triage be completed? ---Yes Related visit to physician within the last 2 weeks? ---No Does the PT have any chronic conditions? (i.e. diabetes, asthma, this includes High risk factors for pregnancy, etc.) ---Yes List chronic conditions. ---HTN Is this a behavioral health or substance abuse call? ---No Guidelines Guideline Title Affirmed Question Affirmed Notes Nurse Date/Time (Eastern Time) Blood Pressure - High [9] Systolic BP >= 678 OR Diastolic >= 80 AND [9] taking BP medications Carmon, RN, Denise 12/31/2019 10:57:14 AM Disp. Time Eilene Ghazi Time) Disposition Final User 12/31/2019 11:01:16 AM See PCP within 2 Weeks Yes  Carmon, RN, Yevette Edwards Disagree/Comply Comply Caller Understands Yes PLEASE NOTE: All timestamps contained within this report are represented as [place] Standard Time. CONFIDENTIALTY NOTICE: This fax transmission is intended only for the addressee. It contains information that is legally privileged, confidential or otherwise protected from use or disclosure. If you are not the intended recipient, you are strictly prohibited from reviewing, disclosing, copying using or disseminating any of this information or taking any action in reliance on or regarding this information. If you have received this fax in error, please notify us immediately by telephone so that we can arrange for its return to [place]. Phone: [phone], Toll-Free: [phone], Fax: [phone] Page: 2 of 2 Call Id: [id number] PreDisposition Call Doctor Care Advice Given Per Guideline SEE PCP WITHIN 2 WEEKS: REASSURANCE AND EDUCATION: * You might need to have an adjustment in your medication(s). HIGH BLOOD PRESSURE MEDICINES: * It is important to take prescribed medications as directed. CALL BACK IF: * Chest pain or difficulty breathing occurs * Weakness or numbness of the face, arm or leg on one side of the body occurs * Difficulty walking, difficulty talking, or severe headache occurs * Your blood pressure is over 160/100 * You become worse CARE ADVICE given per High Blood Pressure (Adult) guideline. Comments User: Baruch Goldmann Date/Time Eilene Ghazi Time): 12/31/2019 10:46:46 AM Caller transferred from the office. User: Romeo Apple, RN Date/Time Eilene Ghazi Time): 12/31/2019 11:02:54 AM Pt has a MD appt on Friday, 01/04/20 at [facility]

## 2019-12-31 NOTE — Telephone Encounter (Signed)
Find out if he feels comfortable waiting till Friday. If not, add him on at 8AM tomorrow---to do EKG and I will see him as soon as our meeting ends

## 2019-12-31 NOTE — Telephone Encounter (Signed)
Per appt notes pt already has appt with Dr Silvio Pate on 01/04/20 at 11 AM.

## 2019-12-31 NOTE — Telephone Encounter (Signed)
Spoke to pt. He said he is okay to wait until Friday.

## 2020-01-02 DIAGNOSIS — H2513 Age-related nuclear cataract, bilateral: Secondary | ICD-10-CM | POA: Diagnosis not present

## 2020-01-04 ENCOUNTER — Ambulatory Visit (INDEPENDENT_AMBULATORY_CARE_PROVIDER_SITE_OTHER): Payer: PPO | Admitting: Internal Medicine

## 2020-01-04 ENCOUNTER — Other Ambulatory Visit: Payer: Self-pay

## 2020-01-04 ENCOUNTER — Encounter: Payer: Self-pay | Admitting: Internal Medicine

## 2020-01-04 VITALS — BP 122/86 | HR 76 | Temp 98.1°F | Ht 68.0 in | Wt 200.0 lb

## 2020-01-04 DIAGNOSIS — R0600 Dyspnea, unspecified: Secondary | ICD-10-CM | POA: Diagnosis not present

## 2020-01-04 DIAGNOSIS — R0609 Other forms of dyspnea: Secondary | ICD-10-CM

## 2020-01-04 DIAGNOSIS — I10 Essential (primary) hypertension: Secondary | ICD-10-CM

## 2020-01-04 DIAGNOSIS — K219 Gastro-esophageal reflux disease without esophagitis: Secondary | ICD-10-CM

## 2020-01-04 NOTE — Assessment & Plan Note (Signed)
BP Readings from Last 3 Encounters:  01/04/20 122/86  10/10/19 110/70  09/10/19 107/81   Control seems fine on the losartan No change needed Asked him to not check soon after having caffeine

## 2020-01-04 NOTE — Progress Notes (Signed)
Subjective:    Patient ID: Randall Reeves, male    DOB: 08-08-1945, 74 y.o.   MRN: [id number]  HPI Here due to elevated blood pressure This visit occurred during the SARS-CoV-2 public health emergency.  Safety protocols were in place, including screening questions prior to the visit, additional usage of staff PPE, and extensive cleaning of exam room while observing appropriate contact time as indicated for disinfecting solutions.   Concerned about elevated blood pressure readings 137/73 up to 154/87 Feels his caffeine intake Sazama be increasing it Coffee (usually 2 AM cups) and a pepsi or green tea No headache or flushing associated with caffeine. Did have past spell of palpitations when extra caffeine though (5 days ago)  Gets DOE --climbing steep hill, etc Gets leg pain with this also Marcou get slight chest pain---like associated with using leaf blower for hours No edema He gets exhausted by the end of a day working ---which wouldn't have happened before  Current Outpatient Medications on File Prior to Visit  Medication Sig Dispense Refill  . acetaminophen (TYLENOL) 500 MG tablet Take 1,000 mg by mouth every 8 (eight) hours as needed.    Marland Kitchen aspirin EC 81 MG tablet Take 81 mg by mouth daily.    Marland Kitchen losartan (COZAAR) 50 MG tablet TAKE 1 TABLET BY MOUTH ONCE DAILY 90 tablet 3  . LUTEIN PO Take 1 tablet by mouth every evening.    . Multiple Vitamin (MULTIVITAMIN) tablet Take 1 tablet by mouth daily.    Marland Kitchen omeprazole (PRILOSEC) 20 MG capsule TAKE 1 CAPSULE BY MOUTH TWICE DAILY 180 capsule 3  . silodosin (RAPAFLO) 4 MG CAPS capsule Take 1 capsule (4 mg total) by mouth daily with breakfast. 90 capsule 3  . tadalafil (CIALIS) 20 MG tablet Take 0.5-1 tablets (10-20 mg total) by mouth every other day as needed for erectile dysfunction. 5 tablet 11  . triamcinolone cream (KENALOG) 0.1 % Apply 1 application topically 2 (two) times daily as needed. 45 g 1   No current facility-administered medications on  file prior to visit.    Allergies  Allergen Reactions  . Bisoprolol-Hydrochlorothiazide     REACTION: muscle pain, cramps  . Codeine Nausea Only  . Imipramine Other (See Comments)    Dry mouth, dizziness, headaches, increased sweating   . Tamsulosin Other (See Comments)    Dizziness     Past Medical History:  Diagnosis Date  . Allergy   . Arthritis   . CKD (chronic kidney disease), stage III ([place])   . Coronary atherosclerosis of unspecified type of vessel, native or graft 2011   MI but cath benign. Dr Otis Brace  . GERD (gastroesophageal reflux disease)   . Hyperlipidemia   . Hypertension   . Myocardial infarction ([place]) 2015  . Obstructive sleep apnea    Cpap as of 27 days ago  . Sleep apnea    wears cpap   . Syncope    due to hypotension from BP meds     Past Surgical History:  Procedure Laterality Date  . CARDIAC CATHETERIZATION    . CARPAL TUNNEL RELEASE Left 02/2013  . COLONOSCOPY    . INGUINAL HERNIA REPAIR Left 1983   with vasectomy  . POLYPECTOMY    . SPERMATOCELECTOMY  1998  . VASECTOMY  1983   with inguinal hernia repair    Family History  Problem Relation Age of Onset  . Stroke Mother   . Hypertension Mother   . Diabetes Father   .  Asthma Father   . Heart failure Father   . Heart disease Father   . Hypertension Sister   . Diabetes Sister   . Neuropathy Brother   . Hypertension Brother   . Diabetes Brother   . Heart disease Brother   . Prostate cancer Brother   . Hypertension Brother   . Colon polyps Brother   . Hypertension Brother   . Hypertension Brother   . Colon cancer Neg Hx   . Rectal cancer Neg Hx   . Stomach cancer Neg Hx   . Esophageal cancer Neg Hx     Social History   Socioeconomic History  . Marital status: Married    Spouse name: Not on file  . Number of children: 2  . Years of education: Not on file  . Highest education level: Not on file  Occupational History  . Occupation: Biomedical scientist  Tobacco Use  . Smoking  status: Never Smoker  . Smokeless tobacco: Never Used  Vaping Use  . Vaping Use: Never used  Substance and Sexual Activity  . Alcohol use: Yes    Alcohol/week: 3.0 - 5.0 standard drinks    Types: 3 - 5 Cans of beer per week    Comment:  a few beers in a week.  . Drug use: No  . Sexual activity: Not on file  Other Topics Concern  . Not on file  Social History Narrative   Has living will   Wife is his health care POA--alternate is son   Would accept resuscitation attempts.   Would leave feeding tube decision to his wife   Social Determinants of Health   Financial Resource Strain: Not on file  Food Insecurity: Not on file  Transportation Needs: Not on file  Physical Activity: Not on file  Stress: Not on file  Social Connections: Not on file  Intimate Partner Violence: Not on file   Review of Systems Appetite is okay Still has some foot and hand numbness    Objective:   Physical Exam Constitutional:      Appearance: Normal appearance.  Cardiovascular:     Rate and Rhythm: Normal rate and regular rhythm.     Pulses: Normal pulses.     Heart sounds: No murmur heard. No gallop.   Pulmonary:     Effort: Pulmonary effort is normal.     Breath sounds: Normal breath sounds. No wheezing or rales.  Musculoskeletal:     Cervical back: Neck supple.     Right lower leg: No edema.     Left lower leg: No edema.  Lymphadenopathy:     Cervical: No cervical adenopathy.  Neurological:     Mental Status: He is alert.  Psychiatric:        Mood and Affect: Mood normal.        Behavior: Behavior normal.            Assessment & Plan:

## 2020-01-04 NOTE — Assessment & Plan Note (Signed)
He has noted a clear decrease in exercise tolerance and prominent DOE now Likely deconditioning but can't be sure it is not an anginal equivalent Will set up with cardiology

## 2020-01-04 NOTE — Assessment & Plan Note (Signed)
Continues on omeprazole daily This seems to control his reflux and no dysphagia Doubt this is related to the DOE

## 2020-01-29 ENCOUNTER — Ambulatory Visit: Payer: PPO | Admitting: Cardiology

## 2020-01-29 ENCOUNTER — Other Ambulatory Visit: Payer: Self-pay

## 2020-01-29 ENCOUNTER — Encounter: Payer: Self-pay | Admitting: Cardiology

## 2020-01-29 DIAGNOSIS — R0600 Dyspnea, unspecified: Secondary | ICD-10-CM | POA: Diagnosis not present

## 2020-01-29 DIAGNOSIS — I10 Essential (primary) hypertension: Secondary | ICD-10-CM | POA: Diagnosis not present

## 2020-01-29 DIAGNOSIS — E785 Hyperlipidemia, unspecified: Secondary | ICD-10-CM | POA: Diagnosis not present

## 2020-01-29 DIAGNOSIS — R0609 Other forms of dyspnea: Secondary | ICD-10-CM

## 2020-01-29 DIAGNOSIS — I2510 Atherosclerotic heart disease of native coronary artery without angina pectoris: Secondary | ICD-10-CM | POA: Diagnosis not present

## 2020-01-29 DIAGNOSIS — G4733 Obstructive sleep apnea (adult) (pediatric): Secondary | ICD-10-CM

## 2020-01-29 MED ORDER — ROSUVASTATIN CALCIUM 10 MG PO TABS
10.0000 mg | ORAL_TABLET | Freq: Every day | ORAL | 3 refills | Status: DC
Start: 2020-01-29 — End: 2021-01-21

## 2020-01-29 NOTE — Patient Instructions (Addendum)
Medication Instructions:  START crestor 10mg  daily  *If you need a refill on your cardiac medications before your next appointment, please call your pharmacy*  Testing/Procedures: Cardiopulmonary Stress Test [facility]   Follow-Up: At [facility], you and your health needs are our priority.  As part of our continuing mission to provide you with exceptional heart care, we have created designated Provider Care Teams.  These Care Teams include your primary Cardiologist (physician) and Advanced Practice Providers (APPs -  Physician Assistants and Nurse Practitioners) who all work together to provide you with the care you need, when you need it.  We recommend signing up for the patient portal called "MyChart".  Sign up information is provided on this After Visit Summary.  MyChart is used to connect with patients for Virtual Visits (Telemedicine).  Patients are able to view lab/test results, encounter notes, upcoming appointments, etc.  Non-urgent messages can be sent to your provider as well.   To learn more about what you can do with MyChart, go to [facility].    Your next appointment:   2 month(s)  The format for your next appointment:   In Person  Provider:   [email or web address]

## 2020-01-29 NOTE — Progress Notes (Signed)
Primary Care Provider: Venia Carbon, MD Cardiologist: No primary care provider on file. Electrophysiologist: None  Clinic Note: Chief Complaint  Patient presents with   New Patient (Initial Visit)    Multiple symptoms: Heart fluttering, chest discomfort, exertional dyspnea   Problem List Items Addressed This Visit    Coronary artery disease involving native coronary artery of native heart without angina pectoris (Chronic)    Thought to be either spasm related MI or demand ischemia/infarction back in 2011.  No significant disease on cath.  Lipids look pretty good without being on a statin. He is taking aspirin, although no clear benefit along with ARB.  Not on beta-blocker-has not needed additional blood pressure control  With him having some exercise intolerance, exertional dyspnea and palpitations, would like to distinguish cardiac versus pulmonary etiology or simply deconditioning.  Plan: CPX (cardiopulmonary stress test)      Relevant Medications   rosuvastatin (CRESTOR) 10 MG tablet   Other Relevant Orders   EKG 12-Lead (Completed)   Cardiopulmonary exercise test   Hyperlipidemia with target LDL less than 70 (Chronic)    Has had an adverse reaction to statins. Relatively normal coronaries by cath in 2011.  Pretty well controlled LDL-but now is status can stop.  Would like to see how he does on low-dose rosuvastatin.  Plan: Start rosuvastatin 10 mg daily-start with every other day and titrate up to daily.      Relevant Medications   rosuvastatin (CRESTOR) 10 MG tablet   Essential hypertension, benign (Chronic)    Blood pressures have been stable with PCP evaluations.  Pressures are low but high today, but not unexpected as he is somewhat anxious being here.  Plan: For now continue current dose of losartan.-In the past he has had orthostatic hypotension so would not be overly aggressive especially with him having decreased balance.      Relevant Medications    rosuvastatin (CRESTOR) 10 MG tablet   DOE (dyspnea on exertion) (Chronic)    By his estimation, his exertional dyspnea is related to feeling quite revved up by caffeine-blood pressure and heart rate are both high.  Since he made a conscious effort to start cutting back his caffeine level, his exertional dyspnea and energy levels have improved.  Given his cardiac history, need to exclude ischemic CAD versus pulmonary etiology.  Plan: Cardiopulmonary Stress Test      Relevant Orders   EKG 12-Lead (Completed)   Cardiopulmonary exercise test      HPI:    Randall Reeves is a 75 y.o. male with PMH notable for nonocclusive CAD (MI in 2011), HTN ,CKD 3, and OSA-on CPAP who is being seen today for the evaluation of HYPERTENSION, DOE, and Fast Heart Rates/Palpitations at the request of Venia Carbon, MD.  Randall Reeves was last seen on 06/20/2014 by Dr. Marlou Porch -> we noted that the patient was having some difficulties with dizziness.  Dr. Marlou Porch stop the HCTZ component of losartan-HCTZ because of possible orthostatic hypotension.  It was felt that the non-STEMI back in 2011 was probably type II MI-demand infarction.  Noted that he was no longer taking statin because of myalgias.-Abdomen was discharged to PRN follow-up.  He was seen on January 04, 2020 by Dr. Silvio Pate with concern for elevated blood pressures ranging from the Q000111Q to Q000111Q systolic.  He did indicate that he had not been a little bit liberal with his caffeine intake (usually 2 cups of coffee in the morning along with several  Pepsi's or green tea during the course of the day.  He notes significant decreased stamina along with exertional dyspnea climbing up steep hills etc.  Also noticed some leg discomfort.  There was mention of maybe slight chest pain-after using a leaf blower for several hours..  Also noted fatigue at the end of the day.  Recent Hospitalizations: None  Reviewed  CV studies:    The following studies were reviewed today: (if  available, images/films reviewed: From Epic Chart or Care Everywhere)  Cardiac Cath 10/20/2009: (In the setting of MI with trivial troponin elevation). LM normal --<LAD, LCx. LAD (2 small Diags) - no significant disease, LCx (co-dom with PDA) & 2 OMs (OM2 moderate size with faint "hazy" defect - not flow limiting. Co-dom RCA with minor irregularities.  EF ~60%. ? Mild Inf HK. --> Med Rx.  Felt to be Type II MI-demand infarct  Echocardiogram October 2014:: EF 55 to 60%.  GR 1 DD.  Normal motion.  Normal valves.  Echocardiogram December 29, 2015: Normal LV size and function.  EF 55 to 60%.  GR 2 DD.  Normal valves.   Interval History:   Randall Reeves is here today somewhat sheepishly knowledge of the he thinks a lot of her symptoms are probably related to him feeling a little overboard on caffeine.  He was drinking quite a bit of coffee and Pepsi as well as 3 months of he is feeling off and on palpitations/heart fluttering and noticing exertional fatigue.  He was noticing getting short of breath doing things like walking up hills that previously without a problem for him.  He decided to cut down his caffeine to where he is no longer drinking coffee and tea with maybe 1 soda.  He indicates that since doing this his stamina is notably improved and the heart fluttering has definitely almost all elevated.  He is no longer having any chest discomfort he is not having as much dyspnea.  Over the last few weeks, he is not really having any more symptoms. He is very active walking all over the property, he enjoys splitting wood.  Just this past weekend he walked 2 and half miles without any difficulty.  No dyspnea no chest pain no leg discomfort.  CV Review of Symptoms (Summary): positive for - dyspnea on exertion, irregular heartbeat, palpitations, rapid heart rate and Maybe some mild chest discomfort negative for - edema, orthopnea, paroxysmal nocturnal dyspnea, shortness of breath or Syncope or near syncope,  TIA/amaurosis fugax, claudication  The patient does not have symptoms concerning for COVID-19 infection (fever, chills, cough, or new shortness of breath).   REVIEWED OF SYSTEMS   Review of Systems  Constitutional: Negative for malaise/fatigue (Fatigue notably improving.  Stamina is much better.  (All with reducing caffeine)) and weight loss.  HENT: Negative for congestion and nosebleeds.   Respiratory: Positive for shortness of breath (Exertional). Negative for cough.   Cardiovascular: Positive for chest pain (Described as tightness) and palpitations.  Gastrointestinal: Negative for abdominal pain, blood in stool, melena and nausea.  Genitourinary: Negative for hematuria.  Musculoskeletal: Positive for myalgias (Legs aching-also improved.). Negative for falls.  Neurological: Positive for dizziness (Better now). Negative for sensory change, speech change, focal weakness and headaches.  Psychiatric/Behavioral: Negative for depression and memory loss. The patient is not nervous/anxious and does not have insomnia.    I have reviewed and (if needed) personally updated the patient's problem list, medications, allergies, past medical and surgical history, social and family history.  PAST MEDICAL HISTORY   Past Medical History:  Diagnosis Date   Allergy    Arthritis    CKD (chronic kidney disease), stage III ([place])    Coronary atherosclerosis of unspecified type of vessel, native or graft 09/2009   Minimal CAD / no flow-limiting lesions on Cath    GERD (gastroesophageal reflux disease)    Hyperlipidemia    Hypertension    Myocardial infarction - Type 2a MI 2015    Felt to be Type II MI-demand infarct   Obstructive sleep apnea    Cpap as of 27 days ago   Sleep apnea    wears cpap    Syncope    due to hypotension from BP meds     PAST SURGICAL HISTORY   Past Surgical History:  Procedure Laterality Date   CARPAL TUNNEL RELEASE Left 02/2013   COLONOSCOPY     INGUINAL  HERNIA REPAIR Left 1983   with vasectomy   LEFT HEART CATH AND CORONARY ANGIOGRAPHY  10/20/2009   (In the setting of MI with trivial troponin elevation). LM normal --<LAD, LCx. LAD (2 small Diags) - no significant disease, LCx (co-dom with PDA) & 2 OMs (OM2 moderate size with faint "hazy" defect - not flow limiting. Co-dom RCA with minor irregularities.  EF ~60%. ? Mild Inf HK. --> Med Rx.  Felt to be Type II MI-demand infarct   POLYPECTOMY     SPERMATOCELECTOMY  1998   TRANSTHORACIC ECHOCARDIOGRAM  10/2012; 12/2015   a) EF 55 to 60%.  GR 1 DD.  Normal motion.  Normal valves.;; b) Normal LV size and function.  EF 55 to 60%.  GR 2 DD.  Normal valves.   VASECTOMY  1983   with inguinal hernia repair    Immunization History  Administered Date(s) Administered   Fluad Quad(high Dose 65+) 11/09/2018   Influenza Whole 12/16/2011   Influenza, High Dose Seasonal PF 11/10/2014, 11/09/2015   Influenza,inj,Quad PF,6+ Mos 10/30/2012   Influenza-Unspecified 11/08/2013, 11/12/2016, 10/25/2017, 09/26/2019   PFIZER SARS-COV-2 Vaccination 04/01/2019, 04/22/2019, 10/26/2019   Pneumococcal Conjugate-13 01/08/2014   Pneumococcal Polysaccharide-23 10/30/2012   Td 10/01/2005   Tdap 02/18/2016   Zoster 10/20/2010   Zoster Recombinat (Shingrix) 11/27/2018, 04/18/2019    MEDICATIONS/ALLERGIES   Current Meds  Medication Sig   acetaminophen (TYLENOL) 500 MG tablet Take 1,000 mg by mouth every 8 (eight) hours as needed.   aspirin EC 81 MG tablet Take 81 mg by mouth daily.   losartan (COZAAR) 50 MG tablet TAKE 1 TABLET BY MOUTH ONCE DAILY   LUTEIN PO Take 1 tablet by mouth every evening.   Multiple Vitamin (MULTIVITAMIN) tablet Take 1 tablet by mouth daily.   omeprazole (PRILOSEC) 20 MG capsule TAKE 1 CAPSULE BY MOUTH TWICE DAILY   rosuvastatin (CRESTOR) 10 MG tablet Take 1 tablet (10 mg total) by mouth daily.   tadalafil (CIALIS) 20 MG tablet Take 0.5-1 tablets (10-20 mg total) by  mouth every other day as needed for erectile dysfunction.   triamcinolone cream (KENALOG) 0.1 % Apply 1 application topically 2 (two) times daily as needed.   [DISCONTINUED] silodosin (RAPAFLO) 4 MG CAPS capsule Take 1 capsule (4 mg total) by mouth daily with breakfast.    Allergies  Allergen Reactions   Bisoprolol-Hydrochlorothiazide     REACTION: muscle pain, cramps   Codeine Nausea Only   Imipramine Other (See Comments)    Dry mouth, dizziness, headaches, increased sweating    Tamsulosin Other (See Comments)    Dizziness  SOCIAL HISTORY/FAMILY HISTORY   Reviewed in Epic:  Pertinent findings:  Social History   Tobacco Use   Smoking status: Never Smoker   Smokeless tobacco: Never Used  Vaping Use   Vaping Use: Never used  Substance Use Topics   Alcohol use: Yes    Alcohol/week: 3.0 - 5.0 standard drinks    Types: 3 - 5 Cans of beer per week    Comment:  a few beers in a week.   Drug use: No   Social History   Social History Narrative   Has living will   Wife is his health care POA--alternate is son   Would accept resuscitation attempts.   Would leave feeding tube decision to his wife    OBJCTIVE -PE, EKG, labs   Wt Readings from Last 3 Encounters:  01/29/20 196 lb (88.9 kg)  01/04/20 200 lb (90.7 kg)  10/10/19 197 lb (89.4 kg)    Physical Exam: BP 139/81    Pulse 77    Ht 5' 9.5" (1.765 m)    Wt 196 lb (88.9 kg)    SpO2 100%    BMI 28.53 kg/m  Physical Exam Vitals reviewed.  Constitutional:      General: He is not in acute distress.    Appearance: Normal appearance. He is normal weight. He is not ill-appearing or toxic-appearing.  HENT:     Head: Normocephalic and atraumatic.  Neck:     Vascular: No carotid bruit, hepatojugular reflux or JVD.  Cardiovascular:     Rate and Rhythm: Normal rate and regular rhythm.  No extrasystoles are present.    Chest Wall: PMI is not displaced.     Pulses: Intact distal pulses.     Heart sounds:  Normal heart sounds. No murmur heard. No friction rub. No gallop.   Pulmonary:     Effort: Pulmonary effort is normal. No respiratory distress.     Breath sounds: Normal breath sounds.  Chest:     Chest wall: No tenderness.  Abdominal:     General: Abdomen is flat. Bowel sounds are normal. There is no distension.     Palpations: Abdomen is soft. There is no mass.     Comments: No HSM or bruit  Musculoskeletal:        General: No swelling. Normal range of motion.     Cervical back: Normal range of motion and neck supple.  Skin:    General: Skin is warm and dry.  Neurological:     General: No focal deficit present.     Mental Status: He is alert and oriented to person, place, and time.  Psychiatric:        Mood and Affect: Mood normal.        Behavior: Behavior normal.        Thought Content: Thought content normal.        Judgment: Judgment normal.      Adult ECG Report  Rate: 77;  Rhythm: normal sinus rhythm and Normal axis, intervals and durations.;   Narrative Interpretation: Normal EKG.  Recent Labs: Reviewed Lab Results  Component Value Date   CHOL 148 05/15/2019   HDL 52.20 05/15/2019   LDLCALC 74 05/15/2019   LDLDIRECT 68.0 02/22/2018   TRIG 107.0 05/15/2019   CHOLHDL 3 05/15/2019   Lab Results  Component Value Date   CREATININE 1.33 05/15/2019   BUN 28 (H) 05/15/2019   NA 137 05/15/2019   K 4.3 05/15/2019   CL 101 05/15/2019  CO2 31 05/15/2019   CBC Latest Ref Rng & Units 05/15/2019 04/23/2018 04/22/2018  WBC 4.0 - 10.5 K/uL 4.9 8.2 15.8(H)  Hemoglobin 13.0 - 17.0 g/dL 15.3 12.4(L) 13.9  Hematocrit 39.0 - 52.0 % 45.3 37.9(L) 41.9  Platelets 150.0 - 400.0 K/uL 221.0 176 217    Lab Results  Component Value Date   TSH 2.983 12/28/2015    ASSESSMENT/PLAN    Problem List Items Addressed This Visit    Coronary artery disease involving native coronary artery of native heart without angina pectoris (Chronic)    Thought to be either spasm related MI or  demand ischemia/infarction back in 2011.  No significant disease on cath.  Lipids look pretty good without being on a statin. He is taking aspirin, although no clear benefit along with ARB.  Not on beta-blocker-has not needed additional blood pressure control  With him having some exercise intolerance, exertional dyspnea and palpitations, would like to distinguish cardiac versus pulmonary etiology or simply deconditioning.  Plan: CPX (cardiopulmonary stress test)      Relevant Medications   rosuvastatin (CRESTOR) 10 MG tablet   Other Relevant Orders   EKG 12-Lead (Completed)   Cardiopulmonary exercise test   Hyperlipidemia with target LDL less than 70 (Chronic)    Has had an adverse reaction to statins. Relatively normal coronaries by cath in 2011.  Pretty well controlled LDL-but now is status can stop.  Would like to see how he does on low-dose rosuvastatin.  Plan: Start rosuvastatin 10 mg daily-start with every other day and titrate up to daily.      Relevant Medications   rosuvastatin (CRESTOR) 10 MG tablet   Essential hypertension, benign (Chronic)    Blood pressures have been stable with PCP evaluations.  Pressures are low but high today, but not unexpected as he is somewhat anxious being here.  Plan: For now continue current dose of losartan.-In the past he has had orthostatic hypotension so would not be overly aggressive especially with him having decreased balance.      Relevant Medications   rosuvastatin (CRESTOR) 10 MG tablet   DOE (dyspnea on exertion) (Chronic)    By his estimation, his exertional dyspnea is related to feeling quite revved up by caffeine-blood pressure and heart rate are both high.  Since he made a conscious effort to start cutting back his caffeine level, his exertional dyspnea and energy levels have improved.  Given his cardiac history, need to exclude ischemic CAD versus pulmonary etiology.  Plan: Cardiopulmonary Stress Test      Relevant  Orders   EKG 12-Lead (Completed)   Cardiopulmonary exercise test       COVID-19 Education: The signs and symptoms of COVID-19 were discussed with the patient and how to seek care for testing (follow up with PCP or arrange E-visit).   The importance of social distancing and COVID-19 vaccination was discussed today.  The patient is practicing social distancing & Masking.   I spent a total of 63minutes with the patient spent in direct patient consultation.  Additional time spent with chart review  / charting (studies, outside notes, etc): 16 min Total Time: 37 min   Current medicines are reviewed at length with the patient today.  (+/- concerns) n/a  This visit occurred during the SARS-CoV-2 public health emergency.  Safety protocols were in place, including screening questions prior to the visit, additional usage of staff PPE, and extensive cleaning of exam room while observing appropriate contact time as indicated for disinfecting solutions.  Notice: This dictation was prepared with Dragon dictation along with smaller phrase technology. Any transcriptional errors that result from this process are unintentional and [place] not be corrected upon review.  Patient Instructions / Medication Changes & Studies & Tests Ordered   Patient Instructions  Medication Instructions:  START crestor 10mg  daily  *If you need a refill on your cardiac medications before your next appointment, please call your pharmacy*  Testing/Procedures: Cardiopulmonary Stress Test - [place]: At [facility], you and your health needs are our priority.  As part of our continuing mission to provide you with exceptional heart care, we have created designated Provider Care Teams.  These Care Teams include your primary Cardiologist (physician) and Advanced Practice Providers (APPs -  Physician Assistants and Nurse Practitioners) who all work together to provide you with the care you need, when you need  it.  We recommend signing up for the patient portal called "MyChart".  Sign up information is provided on this After Visit Summary.  MyChart is used to connect with patients for Virtual Visits (Telemedicine).  Patients are able to view lab/test results, encounter notes, upcoming appointments, etc.  Non-urgent messages can be sent to your provider as well.   To learn more about what you can do with MyChart, go to [email or web address].    Your next appointment:   2 month(s)  The format for your next appointment:   In Person  Provider:   Dr. Ellyn Hack      Studies Ordered:   Orders Placed This Encounter  Procedures   Cardiopulmonary exercise test   EKG 12-Lead     Glenetta Hew, M.D., M.S. Interventional Cardiologist   Pager # [phone] Phone # [phone] [address] [place], [place] [address]   Thank you for choosing Heart[facility] at [facility]!!

## 2020-02-03 ENCOUNTER — Encounter: Payer: Self-pay | Admitting: Cardiology

## 2020-02-03 NOTE — Assessment & Plan Note (Addendum)
Thought to be either spasm related MI or demand ischemia/infarction back in 2011.  No significant disease on cath.  Lipids look pretty good without being on a statin. He is taking aspirin, although no clear benefit along with ARB.  Not on beta-blocker-has not needed additional blood pressure control  With him having some exercise intolerance, exertional dyspnea and palpitations, would like to distinguish cardiac versus pulmonary etiology or simply deconditioning.  Plan: CPX (cardiopulmonary stress test)

## 2020-02-03 NOTE — Assessment & Plan Note (Signed)
Blood pressures have been stable with PCP evaluations.  Pressures are low but high today, but not unexpected as he is somewhat anxious being here.  Plan: For now continue current dose of losartan.-In the past he has had orthostatic hypotension so would not be overly aggressive especially with him having decreased balance.

## 2020-02-03 NOTE — Assessment & Plan Note (Addendum)
By his estimation, his exertional dyspnea is related to feeling quite revved up by caffeine-blood pressure and heart rate are both high.  Since he made a conscious effort to start cutting back his caffeine level, his exertional dyspnea and energy levels have improved.  Given his cardiac history, need to exclude ischemic CAD versus pulmonary etiology.  Plan: Cardiopulmonary Stress Test

## 2020-02-03 NOTE — Assessment & Plan Note (Addendum)
Has had an adverse reaction to statins. Relatively normal coronaries by cath in 2011.  Pretty well controlled LDL-but now is status can stop.  Would like to see how he does on low-dose rosuvastatin.  Plan: Start rosuvastatin 10 mg daily-start with every other day and titrate up to daily.

## 2020-02-19 DIAGNOSIS — Z85828 Personal history of other malignant neoplasm of skin: Secondary | ICD-10-CM | POA: Diagnosis not present

## 2020-02-19 DIAGNOSIS — D2261 Melanocytic nevi of right upper limb, including shoulder: Secondary | ICD-10-CM | POA: Diagnosis not present

## 2020-02-19 DIAGNOSIS — L570 Actinic keratosis: Secondary | ICD-10-CM | POA: Diagnosis not present

## 2020-02-19 DIAGNOSIS — D2271 Melanocytic nevi of right lower limb, including hip: Secondary | ICD-10-CM | POA: Diagnosis not present

## 2020-02-19 DIAGNOSIS — D2262 Melanocytic nevi of left upper limb, including shoulder: Secondary | ICD-10-CM | POA: Diagnosis not present

## 2020-02-19 DIAGNOSIS — D225 Melanocytic nevi of trunk: Secondary | ICD-10-CM | POA: Diagnosis not present

## 2020-02-19 DIAGNOSIS — X32XXXA Exposure to sunlight, initial encounter: Secondary | ICD-10-CM | POA: Diagnosis not present

## 2020-03-17 ENCOUNTER — Other Ambulatory Visit: Payer: Self-pay

## 2020-03-17 ENCOUNTER — Ambulatory Visit (HOSPITAL_COMMUNITY): Payer: PPO | Attending: Cardiology

## 2020-03-17 DIAGNOSIS — R0600 Dyspnea, unspecified: Secondary | ICD-10-CM | POA: Insufficient documentation

## 2020-03-17 DIAGNOSIS — R0609 Other forms of dyspnea: Secondary | ICD-10-CM

## 2020-03-17 DIAGNOSIS — I2510 Atherosclerotic heart disease of native coronary artery without angina pectoris: Secondary | ICD-10-CM | POA: Insufficient documentation

## 2020-03-17 HISTORY — PX: OTHER SURGICAL HISTORY: SHX169

## 2020-03-26 ENCOUNTER — Encounter: Payer: Self-pay | Admitting: Cardiology

## 2020-03-26 ENCOUNTER — Other Ambulatory Visit: Payer: Self-pay

## 2020-03-26 ENCOUNTER — Ambulatory Visit: Payer: PPO | Admitting: Cardiology

## 2020-03-26 DIAGNOSIS — R0600 Dyspnea, unspecified: Secondary | ICD-10-CM | POA: Diagnosis not present

## 2020-03-26 DIAGNOSIS — R0609 Other forms of dyspnea: Secondary | ICD-10-CM

## 2020-03-26 DIAGNOSIS — I10 Essential (primary) hypertension: Secondary | ICD-10-CM

## 2020-03-26 DIAGNOSIS — I2510 Atherosclerotic heart disease of native coronary artery without angina pectoris: Secondary | ICD-10-CM

## 2020-03-26 DIAGNOSIS — E785 Hyperlipidemia, unspecified: Secondary | ICD-10-CM

## 2020-03-26 NOTE — Patient Instructions (Signed)
Medication Instructions:   NO CHANGES *If you need a refill on your cardiac medications before your next appointment, please call your pharmacy*   Lab Work: NOT NEEDED   Testing/Procedures:  NOT NEEDED  Follow-Up: At [facility], you and your health needs are our priority.  As part of our continuing mission to provide you with exceptional heart care, we have created designated Provider Care Teams.  These Care Teams include your primary Cardiologist (physician) and Advanced Practice Providers (APPs -  Physician Assistants and Nurse Practitioners) who all work together to provide you with the care you need, when you need it.     Your next appointment:    AS NEEDED  The format for your next appointment:   In Person  Provider:   Glenetta Hew, MD

## 2020-03-26 NOTE — Progress Notes (Signed)
Primary Care Provider: Venia Carbon, MD Cardiologist: No primary care provider on file. Electrophysiologist: None  Clinic Note: Chief Complaint  Patient presents with  . Follow-up    Test results-CPX  . Shortness of Breath    Exertional; stable     ASSESSMENT/PLAN   Problem List Items Addressed This Visit    Coronary artery disease involving native coronary artery of native heart without angina pectoris (Chronic)    Nonocclusive CAD on cath back in 2016.  Now CPX with excellent effort and no symptoms at 91% max.  Heart rate.  Nothing to suggest cardiopulmonary deficit.   Remains on ARB and statin along with aspirin.      Hyperlipidemia with target LDL less than 70 (Chronic)    Adverse reactions to statins in the past, now tolerating rosuvastatin 10 mg daily.   he did fine with every other day and subsequently increased to daily.  Follow-up lipids have not yet been checked, but I do this spring..      Essential hypertension, benign (Chronic)    Blood pressure looks good today.  Decent blood pressure response to exercise.  Continue ARB Would not be too aggressive because of poor balance issues and orthostatic hypotension in the past.      DOE (dyspnea on exertion) (Chronic)    He actually did very well with excellent stamina on CPX.  This would indicate exertional dyspnea is probably just deconditioning.        ==================================  HPI:    Randall Reeves is a 75 y.o. male with PMH notable for nonocclusive CAD (MI in 2011), HTN ,CKD 3, and OSA-on CPAP who is being seen today for the evaluation of HYPERTENSION, DOE, and Fast Heart Rates/Palpitations at the request of Venia Carbon, MD.  Randall Reeves was last seen on 06/20/2014 by Dr. Marlou Porch -> we noted that the patient was having some difficulties with dizziness ->  History of Demand Ischemia Infarct back in 2011 in setting of sepsis (see cath report)...  Noted that he was no longer taking statin because  of myalgias.-Abdomen was discharged to PRN follow-up.  Randall Reeves was seen on Jan 4,2022 for initial consultation-evaluation of hypertension and establish cardiology care.  Yang seem to think that a lot of this had to do with him drinking lots of tea and coffee  more than he usually does.  He has subsequently cut back on caffeine, sodas/tea.  In doing so, his symptoms pretty much abated.  He felt off-and-on palpitations and heart fluttering as well as some fatigue.  A little bit of exertional dyspnea especially going uphill.  He also mentioned occasionally having chest discomfort.  Again, symptoms seem to improve when he cut back caffeine.  Recent Hospitalizations: None  Reviewed  CV studies:    The following studies were reviewed today: (if available, images/films reviewed: From Epic Chart or Care Everywhere) . Cardiopulmonary Exercise Test (CPX), February 21st 2022: o Preexercise PFTs: FVC 4.33 (105% predicted), FEV1 3.23 (104%) FEV1/FVC 75 (98% ) MVV 90 (73%): Normal o Exercise 12 min; stopped due to hip and knee fatigue and mild dyspnea.  Rest HR 80 bpm-> max HR 135 bpm (91% of MPHR); BP 124/76 up to 166/74 mmHg; good exercise effort. o Peak VO2 26.5 (108% predicted); VE/VCO2 slope 30.  Peak RER 1.1, ventilator threshold 20.6 (84% predicted, 78% measured peak VO2).  Pulse ox remained 94 to 99%. o Gas exchange demonstrated normal peak VO2.  At peak exercise ventilation reached  95 % indicating nearly depleted ventilatory reserve.  O2 pulse (troponin) increased reaching peak of 17 mL/ beat (113% predicted) Interpretation: Excellent functional capacity and compared to max sedentary norms.  No cardiopulmonary limitations noted during exercise.  At peak exercise limited by obesity.  High anaerobic threshold suggests excellent training effect.   Interval History:   Randall Reeves is here today for follow-up of his CPX.  He said on the day that he had the episode of lightheadedness and dizziness, that he had  had a long day of working in the yard and had been pushing himself to the limit.  He felt a lot of stress and then started feeling short of breath most notably walking uphill.  Thankfully, he has not had any episodes like that since last visit. Over the last few weeks, he is not really having any more symptoms. He is very active walking all over the property, he enjoys splitting wood.  Just this past weekend he walked 2 and half miles without any difficulty.  No dyspnea no chest pain no leg discomfort.  He thinks some of the chest discomfort he was feeling at the time of last visit was related to using the leaf blower a lot.  He was having musculoskeletal type symptoms.  CV Review of Symptoms (Summary): positive for - dyspnea on exertion, irregular heartbeat, palpitations, rapid heart rate and Maybe some mild chest discomfort negative for - edema, orthopnea, paroxysmal nocturnal dyspnea, shortness of breath or Syncope or near syncope, TIA/amaurosis fugax, claudication  The patient DOES NOT have symptoms concerning for COVID-19 infection (fever, chills, cough, or new shortness of breath).   REVIEWED OF SYSTEMS   Review of Systems  Constitutional: Negative for malaise/fatigue (Fatigue notably improving.  Stamina is much better.  (All with reducing caffeine)) and weight loss.  HENT: Negative for congestion and nosebleeds.   Respiratory: Positive for shortness of breath (Exertional). Negative for cough.   Cardiovascular: Positive for palpitations. Negative for chest pain (Described really as difficulty breathing with exercise.).       Near the end of the visit, he mentioned to me that he feels that his hands and feet get cold very easily.  This is much worse in the wintertime, and he often wears gloves or heavy socks  Gastrointestinal: Negative for abdominal pain, blood in stool, melena and nausea.  Genitourinary: Negative for hematuria.  Musculoskeletal: Positive for myalgias (leg aching  improved.). Negative for falls.  Neurological: Positive for dizziness (Better now). Negative for sensory change, speech change, focal weakness and headaches.  Psychiatric/Behavioral: Negative for depression and memory loss. The patient is not nervous/anxious and does not have insomnia.    I have reviewed and (if needed) personally updated the patient's problem list, medications, allergies, past medical and surgical history, social and family history.   PAST MEDICAL HISTORY   Past Medical History:  Diagnosis Date  . Allergy   . Arthritis   . CKD (chronic kidney disease), stage III ([place])   . Coronary artery disease, non-occlusive 09/2009   Minimal CAD / no flow-limiting lesions on Cath; CPX February 2022: 1200 exercise, reached 91% image PRN.  Peak VO2 26.5-normal= excellent functional capacity.  High anaerobic threshold suggest excellent trending effect.  At peak exercise, limited by obesity.:  Normal CPX  . GERD (gastroesophageal reflux disease)   . Hyperlipidemia   . Hypertension   . Myocardial infarction - Type 2a MI 2015    Felt to be Type II MI-demand infarct  . Obstructive  sleep apnea    Cpap as of 27 days ago  . Sleep apnea    wears cpap   . Syncope    due to hypotension from BP meds     PAST SURGICAL HISTORY   Past Surgical History:  Procedure Laterality Date  . CARDIOPULMONARY STRESS TEST  03/17/2020   PreEx PFTs: FVC 4.33 (105%), FEV1 3.23 (104%) FEV1/FVC 75 (98% ) MVV 90 (73%): NORMAL; Exercise 12 min;hip and knee fatigue and mild dyspnea.  Rest HR 80 bpm-> max HR 135 bpm (91% of MPHR); BP 124/76 up to 166/74 mmHg; good exercise effort. Peak VO2 26.5 (108% predicted); VE/VCO2 slope 30.  Peak RER 1.1, ventilator threshold 20.6 (84% predicted, 78% measured peak VO2).  Pulse ox remained 94 to 99%  . CARPAL TUNNEL RELEASE Left 02/2013  . COLONOSCOPY    . INGUINAL HERNIA REPAIR Left 1983   with vasectomy  . LEFT HEART CATH AND CORONARY ANGIOGRAPHY  10/20/2009   (In the  setting of MI with trivial troponin elevation). LM normal --<LAD, LCx. LAD (2 small Diags) - no significant disease, LCx (co-dom with PDA) & 2 OMs (OM2 moderate size with faint "hazy" defect - not flow limiting. Co-dom RCA with minor irregularities.  EF ~60%. ? Mild Inf HK. --> Med Rx.  Felt to be Type II MI-demand infarct  . POLYPECTOMY    . SPERMATOCELECTOMY  1998  . TRANSTHORACIC ECHOCARDIOGRAM  10/2012; 12/2015   a) EF 55 to 60%.  GR 1 DD.  Normal motion.  Normal valves.;; b) Normal LV size and function.  EF 55 to 60%.  GR 2 DD.  Normal valves.  Marland Kitchen VASECTOMY  1983   with inguinal hernia repair    Immunization History  Administered Date(s) Administered  . Fluad Quad(high Dose 65+) 11/09/2018  . Influenza Whole 12/16/2011  . Influenza, High Dose Seasonal PF 11/10/2014, 11/09/2015  . Influenza,inj,Quad PF,6+ Mos 10/30/2012  . Influenza-Unspecified 11/08/2013, 11/12/2016, 10/25/2017, 09/26/2019  . PFIZER(Purple Top)SARS-COV-2 Vaccination 04/01/2019, 04/22/2019, 10/26/2019  . Pneumococcal Conjugate-13 01/08/2014  . Pneumococcal Polysaccharide-23 10/30/2012  . Td 10/01/2005  . Tdap 02/18/2016  . Zoster 10/20/2010  . Zoster Recombinat (Shingrix) 11/27/2018, 04/18/2019    MEDICATIONS/ALLERGIES   Current Meds  Medication Sig  . acetaminophen (TYLENOL) 500 MG tablet Take 1,000 mg by mouth every 8 (eight) hours as needed.  Marland Kitchen aspirin EC 81 MG tablet Take 81 mg by mouth daily.  Marland Kitchen losartan (COZAAR) 50 MG tablet TAKE 1 TABLET BY MOUTH ONCE DAILY  . LUTEIN PO Take 1 tablet by mouth every evening.  . Multiple Vitamin (MULTIVITAMIN) tablet Take 1 tablet by mouth daily.  Marland Kitchen omeprazole (PRILOSEC) 20 MG capsule TAKE 1 CAPSULE BY MOUTH TWICE DAILY  . rosuvastatin (CRESTOR) 10 MG tablet Take 1 tablet (10 mg total) by mouth daily.  . tadalafil (CIALIS) 20 MG tablet Take 0.5-1 tablets (10-20 mg total) by mouth every other day as needed for erectile dysfunction.  . triamcinolone cream (KENALOG) 0.1 %  Apply 1 application topically 2 (two) times daily as needed.    Allergies  Allergen Reactions  . Bisoprolol-Hydrochlorothiazide     REACTION: muscle pain, cramps  . Codeine Nausea Only  . Imipramine Other (See Comments)    Dry mouth, dizziness, headaches, increased sweating   . Tamsulosin Other (See Comments)    Dizziness     SOCIAL HISTORY/FAMILY HISTORY   Reviewed in Epic:  Pertinent findings:  Social History   Tobacco Use  . Smoking status: Never Smoker  .  Smokeless tobacco: Never Used  Vaping Use  . Vaping Use: Never used  Substance Use Topics  . Alcohol use: Yes    Alcohol/week: 3.0 - 5.0 standard drinks    Types: 3 - 5 Cans of beer per week    Comment:  a few beers in a week.  . Drug use: No   Social History   Social History Narrative   Has living will   Wife is his health care POA--alternate is son   Would accept resuscitation attempts.   Would leave feeding tube decision to his wife    OBJCTIVE -PE, EKG, labs   Wt Readings from Last 3 Encounters:  03/26/20 196 lb (88.9 kg)  01/29/20 196 lb (88.9 kg)  01/04/20 200 lb (90.7 kg)    Physical Exam: BP 128/76   Pulse 88   Ht 5\' 10"  (1.778 m)   Wt 196 lb (88.9 kg)   SpO2 96%   BMI 28.12 kg/m  Physical Exam Vitals reviewed.  Constitutional:      General: He is not in acute distress.    Appearance: Normal appearance. He is normal weight. He is not ill-appearing or toxic-appearing.  HENT:     Head: Normocephalic and atraumatic.  Neck:     Vascular: No carotid bruit, hepatojugular reflux or JVD.  Cardiovascular:     Rate and Rhythm: Normal rate and regular rhythm.  No extrasystoles are present.    Chest Wall: PMI is not displaced.     Pulses: Intact distal pulses.     Heart sounds: Normal heart sounds. No murmur heard. No friction rub. No gallop.   Pulmonary:     Effort: Pulmonary effort is normal. No respiratory distress.     Breath sounds: Normal breath sounds.  Chest:     Chest wall: No  tenderness.  Musculoskeletal:        General: No swelling. Normal range of motion.     Cervical back: Normal range of motion and neck supple.  Neurological:     General: No focal deficit present.     Mental Status: He is alert and oriented to person, place, and time.  Psychiatric:        Mood and Affect: Mood normal.        Behavior: Behavior normal.        Thought Content: Thought content normal.        Judgment: Judgment normal.      Adult ECG Report Not checked  Recent Labs: Reviewed Lab Results  Component Value Date   CHOL 148 05/15/2019   HDL 52.20 05/15/2019   LDLCALC 74 05/15/2019   LDLDIRECT 68.0 02/22/2018   TRIG 107.0 05/15/2019   CHOLHDL 3 05/15/2019   Lab Results  Component Value Date   CREATININE 1.33 05/15/2019   BUN 28 (H) 05/15/2019   NA 137 05/15/2019   K 4.3 05/15/2019   CL 101 05/15/2019   CO2 31 05/15/2019   CBC Latest Ref Rng & Units 05/15/2019 04/23/2018 04/22/2018  WBC 4.0 - 10.5 K/uL 4.9 8.2 15.8(H)  Hemoglobin 13.0 - 17.0 g/dL 15.3 12.4(L) 13.9  Hematocrit 39.0 - 52.0 % 45.3 37.9(L) 41.9  Platelets 150.0 - 400.0 K/uL 221.0 176 217    Lab Results  Component Value Date   TSH 2.983 12/28/2015    ===================================   COVID-19 Education: The signs and symptoms of COVID-19 were discussed with the patient and how to seek care for testing (follow up with PCP or arrange E-visit).  The importance of social distancing and COVID-19 vaccination was discussed today.  The patient is practicing social distancing & Masking.   I spent a total of 24minutes with the patient spent in direct patient consultation.  Additional time spent with chart review  / charting (studies, outside notes, etc): 16 min Total Time: 44min  Current medicines are reviewed at length with the patient today.  (+/- concerns) N/A  This visit occurred during the SARS-CoV-2 public health emergency.  Safety protocols were in place, including screening questions  prior to the visit, additional usage of staff PPE, and extensive cleaning of exam room while observing appropriate contact time as indicated for disinfecting solutions.  Notice: This dictation was prepared with Dragon dictation along with smaller phrase technology. Any transcriptional errors that result from this process are unintentional and [place] not be corrected upon review.  Patient Instructions / Medication Changes & Studies & Tests Ordered   Patient Instructions  Medication Instructions:   NO CHANGES *If you need a refill on your cardiac medications before your next appointment, please call your pharmacy*   Lab Work: NOT NEEDED   Testing/Procedures:  NOT NEEDED  Follow-Up: At [facility], you and your health needs are our priority.  As part of our continuing mission to provide you with exceptional heart care, we have created designated Provider Care Teams.  These Care Teams include your primary Cardiologist (physician) and Advanced Practice Providers (APPs -  Physician Assistants and Nurse Practitioners) who all work together to provide you with the care you need, when you need it.     Your next appointment:    AS NEEDED  The format for your next appointment:   In Person  Provider:   Glenetta Hew, MD        Studies Ordered:   No orders of the defined types were placed in this encounter.    Glenetta Hew, M.D., M.S. Interventional Cardiologist   Pager # [phone] Phone # [phone] [address] [place], [place] [address]   Thank you for choosing [facility] at [facility]!!

## 2020-04-21 NOTE — Progress Notes (Incomplete)
Primary Care Provider: Venia Carbon, MD Cardiologist: No primary care provider on file. Electrophysiologist: None  Clinic Note: No chief complaint on file.  Problem List Items Addressed This Visit   None     HPI:    Randall Reeves is a 75 y.o. male with PMH notable for nonocclusive CAD (MI in 2011), HTN ,CKD 3, and OSA-on CPAP who is being seen today for the evaluation of HYPERTENSION, DOE, and Fast Heart Rates/Palpitations at the request of Venia Carbon, MD.  Randall Reeves was last seen on 06/20/2014 by Dr. Marlou Porch -> we noted that the patient was having some difficulties with dizziness ->  History of Demand Ischemia Infarct/Non-STEMI back in 2011 in setting of sepsis..  Noted that he was no longer taking statin because of myalgias.-Abdomen was discharged to PRN follow-up.  He was seen on January 04, 2020 by Dr. Silvio Pate with concern for elevated blood pressures ranging from the 338S to 505L systolic.  He did indicate that he had not been a little bit liberal with his caffeine intake (usually 2 cups of coffee in the morning along with several Pepsi's or green tea during the course of the day.  He notes significant decreased stamina along with exertional dyspnea climbing up steep hills etc.  Also noticed some leg discomfort.  There was mention of maybe slight chest pain-after using a leaf blower for several hours..  Also noted fatigue at the end of the day.  [name] was seen on Jan 4,2022 for initial consultation-evaluation of hypertension and establish cardiology care.  Antoinette seem to think that a lot of this had to do with him drinking lots of tea and coffee  more than he usually does.  He has subsequently cut back on caffeine, sodas/tea.  In doing so, his symptoms pretty much abated.  He felt off-and-on palpitations and heart fluttering as well as some fatigue.  A little bit of exertional dyspnea especially going uphill.  He also mentioned occasionally having chest discomfort.  Again,  symptoms seem to improve when he cut back caffeine.  Recent Hospitalizations: None  Reviewed  CV studies:    The following studies were reviewed today: (if available, images/films reviewed: From Epic Chart or Care Everywhere) . GXT   Interval History:   Randall Reeves is here today  Over the last few weeks, he is not really having any more symptoms. He is very active walking all over the property, he enjoys splitting wood.  Just this past weekend he walked 2 and half miles without any difficulty.  No dyspnea no chest pain no leg discomfort.  CV Review of Symptoms (Summary): positive for - dyspnea on exertion, irregular heartbeat, palpitations, rapid heart rate and Maybe some mild chest discomfort negative for - edema, orthopnea, paroxysmal nocturnal dyspnea, shortness of breath or Syncope or near syncope, TIA/amaurosis fugax, claudication  The patient does not have symptoms concerning for COVID-19 infection (fever, chills, cough, or new shortness of breath).   REVIEWED OF SYSTEMS   Review of Systems  Constitutional: Negative for malaise/fatigue (Fatigue notably improving.  Stamina is much better.  (All with reducing caffeine)) and weight loss.  HENT: Negative for congestion and nosebleeds.   Respiratory: Positive for shortness of breath (Exertional). Negative for cough.   Cardiovascular: Positive for chest pain (Described as tightness) and palpitations.  Gastrointestinal: Negative for abdominal pain, blood in stool, melena and nausea.  Genitourinary: Negative for hematuria.  Musculoskeletal: Positive for myalgias (Legs aching-also improved.). Negative  for falls.  Neurological: Positive for dizziness (Better now). Negative for sensory change, speech change, focal weakness and headaches.  Psychiatric/Behavioral: Negative for depression and memory loss. The patient is not nervous/anxious and does not have insomnia.    I have reviewed and (if needed) personally updated the patient's problem  list, medications, allergies, past medical and surgical history, social and family history.   PAST MEDICAL HISTORY   Past Medical History:  Diagnosis Date  . Allergy   . Arthritis   . CKD (chronic kidney disease), stage III ([place])   . Coronary atherosclerosis of unspecified type of vessel, native or graft 09/2009   Minimal CAD / no flow-limiting lesions on Cath   . GERD (gastroesophageal reflux disease)   . Hyperlipidemia   . Hypertension   . Myocardial infarction - Type 2a MI 2015    Felt to be Type II MI-demand infarct  . Obstructive sleep apnea    Cpap as of 27 days ago  . Sleep apnea    wears cpap   . Syncope    due to hypotension from BP meds     PAST SURGICAL HISTORY   Past Surgical History:  Procedure Laterality Date  . CARPAL TUNNEL RELEASE Left 02/2013  . COLONOSCOPY    . INGUINAL HERNIA REPAIR Left 1983   with vasectomy  . LEFT HEART CATH AND CORONARY ANGIOGRAPHY  10/20/2009   (In the setting of MI with trivial troponin elevation). LM normal --<LAD, LCx. LAD (2 small Diags) - no significant disease, LCx (co-dom with PDA) & 2 OMs (OM2 moderate size with faint "hazy" defect - not flow limiting. Co-dom RCA with minor irregularities.  EF ~60%. ? Mild Inf HK. --> Med Rx.  Felt to be Type II MI-demand infarct  . POLYPECTOMY    . SPERMATOCELECTOMY  1998  . TRANSTHORACIC ECHOCARDIOGRAM  10/2012; 12/2015   a) EF 55 to 60%.  GR 1 DD.  Normal motion.  Normal valves.;; b) Normal LV size and function.  EF 55 to 60%.  GR 2 DD.  Normal valves.  Marland Kitchen VASECTOMY  1983   with inguinal hernia repair   . Cardiac Cath 10/20/2009: (In the setting of MI with trivial troponin elevation). LM normal --<LAD, LCx. LAD (2 small Diags) - no significant disease, LCx (co-dom with PDA) & 2 OMs (OM2 moderate size with faint "hazy" defect - not flow limiting. Co-dom RCA with minor irregularities.  EF ~60%. ? Mild Inf HK. --> Med Rx.  Felt to be Type II MI-demand infarct .  . Echocardiogram October 2014::  EF 55 to 60%.  GR 1 DD.  Normal motion.  Normal valves. . Echocardiogram December 29, 2015: Normal LV size and function.  EF 55 to 60%.  GR 2 DD.  Normal valves.  Immunization History  Administered Date(s) Administered  . Fluad Quad(high Dose 65+) 11/09/2018  . Influenza Whole 12/16/2011  . Influenza, High Dose Seasonal PF 11/10/2014, 11/09/2015  . Influenza,inj,Quad PF,6+ Mos 10/30/2012  . Influenza-Unspecified 11/08/2013, 11/12/2016, 10/25/2017, 09/26/2019  . PFIZER(Purple Top)SARS-COV-2 Vaccination 04/01/2019, 04/22/2019, 10/26/2019  . Pneumococcal Conjugate-13 01/08/2014  . Pneumococcal Polysaccharide-23 10/30/2012  . Td 10/01/2005  . Tdap 02/18/2016  . Zoster 10/20/2010  . Zoster Recombinat (Shingrix) 11/27/2018, 04/18/2019    MEDICATIONS/ALLERGIES   Current Meds  Medication Sig  . acetaminophen (TYLENOL) 500 MG tablet Take 1,000 mg by mouth every 8 (eight) hours as needed.  Marland Kitchen aspirin EC 81 MG tablet Take 81 mg by mouth daily.  Marland Kitchen losartan (COZAAR)  50 MG tablet TAKE 1 TABLET BY MOUTH ONCE DAILY  . LUTEIN PO Take 1 tablet by mouth every evening.  . Multiple Vitamin (MULTIVITAMIN) tablet Take 1 tablet by mouth daily.  Marland Kitchen omeprazole (PRILOSEC) 20 MG capsule TAKE 1 CAPSULE BY MOUTH TWICE DAILY  . rosuvastatin (CRESTOR) 10 MG tablet Take 1 tablet (10 mg total) by mouth daily.  . tadalafil (CIALIS) 20 MG tablet Take 0.5-1 tablets (10-20 mg total) by mouth every other day as needed for erectile dysfunction.  . triamcinolone cream (KENALOG) 0.1 % Apply 1 application topically 2 (two) times daily as needed.    Allergies  Allergen Reactions  . Bisoprolol-Hydrochlorothiazide     REACTION: muscle pain, cramps  . Codeine Nausea Only  . Imipramine Other (See Comments)    Dry mouth, dizziness, headaches, increased sweating   . Tamsulosin Other (See Comments)    Dizziness     SOCIAL HISTORY/FAMILY HISTORY   Reviewed in Epic:  Pertinent findings:  Social History   Tobacco Use  .  Smoking status: Never Smoker  . Smokeless tobacco: Never Used  Vaping Use  . Vaping Use: Never used  Substance Use Topics  . Alcohol use: Yes    Alcohol/week: 3.0 - 5.0 standard drinks    Types: 3 - 5 Cans of beer per week    Comment:  a few beers in a week.  . Drug use: No   Social History   Social History Narrative   Has living will   Wife is his health care POA--alternate is son   Would accept resuscitation attempts.   Would leave feeding tube decision to his wife    OBJCTIVE -PE, EKG, labs   Wt Readings from Last 3 Encounters:  03/26/20 196 lb (88.9 kg)  01/29/20 196 lb (88.9 kg)  01/04/20 200 lb (90.7 kg)    Physical Exam: BP 128/76   Pulse 88   Ht 5\' 10"  (1.778 m)   Wt 196 lb (88.9 kg)   SpO2 96%   BMI 28.12 kg/m  Physical Exam Vitals reviewed.  Constitutional:      General: He is not in acute distress.    Appearance: Normal appearance. He is normal weight. He is not ill-appearing or toxic-appearing.  HENT:     Head: Normocephalic and atraumatic.  Neck:     Vascular: No carotid bruit, hepatojugular reflux or JVD.  Cardiovascular:     Rate and Rhythm: Normal rate and regular rhythm.  No extrasystoles are present.    Chest Wall: PMI is not displaced.     Pulses: Intact distal pulses.     Heart sounds: Normal heart sounds. No murmur heard. No friction rub. No gallop.   Pulmonary:     Effort: Pulmonary effort is normal. No respiratory distress.     Breath sounds: Normal breath sounds.  Chest:     Chest wall: No tenderness.  Abdominal:     General: Abdomen is flat. Bowel sounds are normal. There is no distension.     Palpations: Abdomen is soft. There is no mass.     Comments: No HSM or bruit  Musculoskeletal:        General: No swelling. Normal range of motion.     Cervical back: Normal range of motion and neck supple.  Skin:    General: Skin is warm and dry.  Neurological:     General: No focal deficit present.     Mental Status: He is alert and  oriented to person, place,  and time.  Psychiatric:        Mood and Affect: Mood normal.        Behavior: Behavior normal.        Thought Content: Thought content normal.        Judgment: Judgment normal.      Adult ECG Report  Rate: 77;  Rhythm: normal sinus rhythm and Normal axis, intervals and durations.;   Narrative Interpretation: Normal EKG.  Recent Labs: Reviewed Lab Results  Component Value Date   CHOL 148 05/15/2019   HDL 52.20 05/15/2019   LDLCALC 74 05/15/2019   LDLDIRECT 68.0 02/22/2018   TRIG 107.0 05/15/2019   CHOLHDL 3 05/15/2019   Lab Results  Component Value Date   CREATININE 1.33 05/15/2019   BUN 28 (H) 05/15/2019   NA 137 05/15/2019   K 4.3 05/15/2019   CL 101 05/15/2019   CO2 31 05/15/2019   CBC Latest Ref Rng & Units 05/15/2019 04/23/2018 04/22/2018  WBC 4.0 - 10.5 K/uL 4.9 8.2 15.8(H)  Hemoglobin 13.0 - 17.0 g/dL 15.3 12.4(L) 13.9  Hematocrit 39.0 - 52.0 % 45.3 37.9(L) 41.9  Platelets 150.0 - 400.0 K/uL 221.0 176 217    Lab Results  Component Value Date   TSH 2.983 12/28/2015    ASSESSMENT/PLAN    Problem List Items Addressed This Visit   None      COVID-19 Education: The signs and symptoms of COVID-19 were discussed with the patient and how to seek care for testing (follow up with PCP or arrange E-visit).   The importance of social distancing and COVID-19 vaccination was discussed today.  The patient is practicing social distancing & Masking.   I spent a total of 102minutes with the patient spent in direct patient consultation.  Additional time spent with chart review  / charting (studies, outside notes, etc): 16 min Total Time: 37 min   Current medicines are reviewed at length with the patient today.  (+/- concerns) n/a  This visit occurred during the SARS-CoV-2 public health emergency.  Safety protocols were in place, including screening questions prior to the visit, additional usage of staff PPE, and extensive cleaning of exam room  while observing appropriate contact time as indicated for disinfecting solutions.  Notice: This dictation was prepared with Dragon dictation along with smaller phrase technology. Any transcriptional errors that result from this process are unintentional and Geck not be corrected upon review.  Patient Instructions / Medication Changes & Studies & Tests Ordered   There are no Patient Instructions on file for this visit.   Studies Ordered:   No orders of the defined types were placed in this encounter.    Glenetta Hew, M.D., M.S. Interventional Cardiologist   Pager # [phone] Phone # [phone] [address] [place], [place] [address]   Thank you for choosing [facility] at [facility]!!

## 2020-04-22 ENCOUNTER — Encounter: Payer: Self-pay | Admitting: Cardiology

## 2020-04-22 NOTE — Assessment & Plan Note (Signed)
Adverse reactions to statins in the past, now tolerating rosuvastatin 10 mg daily.   he did fine with every other day and subsequently increased to daily.  Follow-up lipids have not yet been checked, but I do this spring.Marland Kitchen

## 2020-04-22 NOTE — Assessment & Plan Note (Signed)
He actually did very well with excellent stamina on CPX.  This would indicate exertional dyspnea is probably just deconditioning.

## 2020-04-22 NOTE — Assessment & Plan Note (Signed)
Nonocclusive CAD on cath back in 2016.  Now CPX with excellent effort and no symptoms at 91% max.  Heart rate.  Nothing to suggest cardiopulmonary deficit.   Remains on ARB and statin along with aspirin.

## 2020-04-22 NOTE — Assessment & Plan Note (Signed)
Blood pressure looks good today.  Decent blood pressure response to exercise.  Continue ARB Would not be too aggressive because of poor balance issues and orthostatic hypotension in the past.

## 2020-05-16 ENCOUNTER — Ambulatory Visit (INDEPENDENT_AMBULATORY_CARE_PROVIDER_SITE_OTHER): Payer: PPO | Admitting: Internal Medicine

## 2020-05-16 ENCOUNTER — Encounter: Payer: Self-pay | Admitting: Internal Medicine

## 2020-05-16 ENCOUNTER — Other Ambulatory Visit: Payer: Self-pay

## 2020-05-16 VITALS — BP 122/82 | HR 86 | Temp 97.9°F | Ht 68.25 in | Wt 200.0 lb

## 2020-05-16 DIAGNOSIS — Z0000 Encounter for general adult medical examination without abnormal findings: Secondary | ICD-10-CM | POA: Diagnosis not present

## 2020-05-16 DIAGNOSIS — I728 Aneurysm of other specified arteries: Secondary | ICD-10-CM | POA: Diagnosis not present

## 2020-05-16 DIAGNOSIS — R200 Anesthesia of skin: Secondary | ICD-10-CM | POA: Diagnosis not present

## 2020-05-16 DIAGNOSIS — I2510 Atherosclerotic heart disease of native coronary artery without angina pectoris: Secondary | ICD-10-CM | POA: Diagnosis not present

## 2020-05-16 DIAGNOSIS — I700 Atherosclerosis of aorta: Secondary | ICD-10-CM

## 2020-05-16 DIAGNOSIS — I722 Aneurysm of renal artery: Secondary | ICD-10-CM

## 2020-05-16 DIAGNOSIS — N1831 Chronic kidney disease, stage 3a: Secondary | ICD-10-CM

## 2020-05-16 DIAGNOSIS — K219 Gastro-esophageal reflux disease without esophagitis: Secondary | ICD-10-CM

## 2020-05-16 DIAGNOSIS — G4733 Obstructive sleep apnea (adult) (pediatric): Secondary | ICD-10-CM

## 2020-05-16 LAB — RENAL FUNCTION PANEL
Albumin: 4.3 g/dL (ref 3.5–5.2)
BUN: 25 mg/dL — ABNORMAL HIGH (ref 6–23)
CO2: 31 mEq/L (ref 19–32)
Calcium: 9.7 mg/dL (ref 8.4–10.5)
Chloride: 103 mEq/L (ref 96–112)
Creatinine, Ser: 1.32 mg/dL (ref 0.40–1.50)
GFR: 53.85 mL/min — ABNORMAL LOW (ref >60.00)
Glucose, Bld: 94 mg/dL (ref 70–99)
Phosphorus: 2.6 mg/dL (ref 2.3–4.6)
Potassium: 4.4 mEq/L (ref 3.5–5.1)
Sodium: 140 mEq/L (ref 135–145)

## 2020-05-16 LAB — HEPATIC FUNCTION PANEL
ALT: 25 U/L (ref 0–53)
AST: 18 U/L (ref 0–37)
Albumin: 4.3 g/dL (ref 3.5–5.2)
Alkaline Phosphatase: 83 U/L (ref 39–117)
Bilirubin, Direct: 0.1 mg/dL (ref 0.0–0.3)
Total Bilirubin: 0.5 mg/dL (ref 0.2–1.2)
Total Protein: 7.3 g/dL (ref 6.0–8.3)

## 2020-05-16 LAB — LIPID PANEL
Cholesterol: 132 mg/dL (ref 0–200)
HDL: 52.3 mg/dL (ref >39.00)
LDL Cholesterol: 45 mg/dL (ref 0–99)
NonHDL: 80.11
Total CHOL/HDL Ratio: 3
Triglycerides: 175 mg/dL — ABNORMAL HIGH (ref 0.0–149.0)
VLDL: 35 mg/dL (ref 0.0–40.0)

## 2020-05-16 LAB — VITAMIN B12: Vitamin B-12: 1008 pg/mL — ABNORMAL HIGH (ref 211–911)

## 2020-05-16 LAB — HEMOGLOBIN A1C: Hgb A1c MFr Bld: 6.1 % (ref 4.6–6.5)

## 2020-05-16 LAB — CBC
HCT: 44.8 % (ref 39.0–52.0)
Hemoglobin: 15 g/dL (ref 13.0–17.0)
MCHC: 33.5 g/dL (ref 30.0–36.0)
MCV: 94.6 fl (ref 78.0–100.0)
Platelets: 242 10*3/uL (ref 150.0–400.0)
RBC: 4.73 Mil/uL (ref 4.22–5.81)
RDW: 13.1 % (ref 11.5–15.5)
WBC: 5.9 10*3/uL (ref 4.0–10.5)

## 2020-05-16 LAB — T4, FREE: Free T4: 0.73 ng/dL (ref 0.60–1.60)

## 2020-05-16 NOTE — Assessment & Plan Note (Signed)
Okay on PPI

## 2020-05-16 NOTE — Progress Notes (Signed)
Subjective:    Patient ID: Randall Reeves, male    DOB: [birth year], 75 y.o.   MRN: [id number]  HPI Here for Medicare wellness visit and follow up of chronic health conditions This visit occurred during the SARS-CoV-2 public health emergency.  Safety protocols were in place, including screening questions prior to the visit, additional usage of staff PPE, and extensive cleaning of exam room while observing appropriate contact time as indicated for disinfecting solutions.   Reviewed form and advanced directives Reviewed other doctors No alcohol or tobacco Tries to walk regularly--but variable No falls--but balance is not as good Vision is okay. Early cataracts Wife notes hearing loss--he doesn't notice No depression or anhedonia Independent with instrumental ADLs No sig memory issues  Did have cardiology evaluation---benign He feels the exercise tolerance Letts be some better Does get tired easy---relates to age Still helps with lawn business No chest pain Breathing is okay No dizziness or syncope No edema  Recent trouble with pollen--drainage, throat clearing Has affected his desire to go out and walk Chlor-[place] and guaifenisin help some  Takes the omeprazole daily now Doesn't need bid now with taking on empty stomach No heartburn or dysphagia on this  Reviewed known aneurysms of splenic and renal arteries Also known aortic atherosclerosis  Ongoing foot numbness [place] have some progression  Last GFR stable at 52  Current Outpatient Medications on File Prior to Visit  Medication Sig Dispense Refill  . aspirin EC 81 MG tablet Take 81 mg by mouth daily.    Marland Kitchen losartan (COZAAR) 50 MG tablet TAKE 1 TABLET BY MOUTH ONCE DAILY 90 tablet 3  . LUTEIN PO Take 1 tablet by mouth every evening.    . Multiple Vitamin (MULTIVITAMIN) tablet Take 1 tablet by mouth daily.    Marland Kitchen omeprazole (PRILOSEC) 20 MG capsule TAKE 1 CAPSULE BY MOUTH TWICE DAILY (Patient taking differently: Take 20 mg by  mouth daily.) 180 capsule 3  . OVER THE COUNTER MEDICATION Chlortrimetone    . triamcinolone cream (KENALOG) 0.1 % Apply 1 application topically 2 (two) times daily as needed. 45 g 1  . acetaminophen (TYLENOL) 500 MG tablet Take 1,000 mg by mouth every 8 (eight) hours as needed. (Patient not taking: Reported on 05/16/2020)    . rosuvastatin (CRESTOR) 10 MG tablet Take 1 tablet (10 mg total) by mouth daily. 90 tablet 3  . tadalafil (CIALIS) 20 MG tablet Take 0.5-1 tablets (10-20 mg total) by mouth every other day as needed for erectile dysfunction. (Patient not taking: Reported on 05/16/2020) 5 tablet 11   No current facility-administered medications on file prior to visit.    Allergies  Allergen Reactions  . Bisoprolol-Hydrochlorothiazide     REACTION: muscle pain, cramps  . Codeine Nausea Only  . Imipramine Other (See Comments)    Dry mouth, dizziness, headaches, increased sweating   . Tamsulosin Other (See Comments)    Dizziness     Past Medical History:  Diagnosis Date  . Allergy   . Arthritis   . CKD (chronic kidney disease), stage III ([place])   . Coronary artery disease, non-occlusive 09/2009   Minimal CAD / no flow-limiting lesions on Cath; CPX February 2022: 1200 exercise, reached 91% image PRN.  Peak VO2 26.5-normal= excellent functional capacity.  High anaerobic threshold suggest excellent trending effect.  At peak exercise, limited by obesity.:  Normal CPX  . GERD (gastroesophageal reflux disease)   . Hyperlipidemia   . Hypertension   . Myocardial infarction - Type  2a MI 2015    Felt to be Type II MI-demand infarct  . Obstructive sleep apnea    Cpap as of 27 days ago  . Sleep apnea    wears cpap   . Syncope    due to hypotension from BP meds     Past Surgical History:  Procedure Laterality Date  . CARDIOPULMONARY STRESS TEST  03/17/2020   PreEx PFTs: FVC 4.33 (105%), FEV1 3.23 (104%) FEV1/FVC 75 (98% ) MVV 90 (73%): NORMAL; Exercise 12 min;hip and knee fatigue and  mild dyspnea.  Rest HR 80 bpm-> max HR 135 bpm (91% of MPHR); BP 124/76 up to 166/74 mmHg; good exercise effort. Peak VO2 26.5 (108% predicted); VE/VCO2 slope 30.  Peak RER 1.1, ventilator threshold 20.6 (84% predicted, 78% measured peak VO2).  Pulse ox remained 94 to 99%  . CARPAL TUNNEL RELEASE Left 02/2013  . COLONOSCOPY    . INGUINAL HERNIA REPAIR Left 1983   with vasectomy  . LEFT HEART CATH AND CORONARY ANGIOGRAPHY  10/20/2009   (In the setting of MI with trivial troponin elevation). LM normal --<LAD, LCx. LAD (2 small Diags) - no significant disease, LCx (co-dom with PDA) & 2 OMs (OM2 moderate size with faint "hazy" defect - not flow limiting. Co-dom RCA with minor irregularities.  EF ~60%. ? Mild Inf HK. --> Med Rx.  Felt to be Type II MI-demand infarct  . POLYPECTOMY    . SPERMATOCELECTOMY  1998  . TRANSTHORACIC ECHOCARDIOGRAM  10/2012; 12/2015   a) EF 55 to 60%.  GR 1 DD.  Normal motion.  Normal valves.;; b) Normal LV size and function.  EF 55 to 60%.  GR 2 DD.  Normal valves.  Marland Kitchen VASECTOMY  1983   with inguinal hernia repair    Family History  Problem Relation Age of Onset  . Stroke Mother   . Hypertension Mother   . Diabetes Father   . Asthma Father   . Heart failure Father   . Heart disease Father   . Hypertension Sister   . Diabetes Sister   . Neuropathy Brother   . Hypertension Brother   . Diabetes Brother   . Heart disease Brother   . Prostate cancer Brother   . Hypertension Brother   . Colon polyps Brother   . Hypertension Brother   . Hypertension Brother   . Colon cancer Neg Hx   . Rectal cancer Neg Hx   . Stomach cancer Neg Hx   . Esophageal cancer Neg Hx     Social History   Socioeconomic History  . Marital status: Married    Spouse name: Not on file  . Number of children: 2  . Years of education: Not on file  . Highest education level: Not on file  Occupational History  . Occupation: Biomedical scientist  Tobacco Use  . Smoking status: Never Smoker  .  Smokeless tobacco: Never Used  Vaping Use  . Vaping Use: Never used  Substance and Sexual Activity  . Alcohol use: Yes    Alcohol/week: 3.0 - 5.0 standard drinks    Types: 3 - 5 Cans of beer per week    Comment:  a few beers in a week.  . Drug use: No  . Sexual activity: Not on file  Other Topics Concern  . Not on file  Social History Narrative   Has living will   Wife is his health care POA--alternate is son   Would accept resuscitation attempts.   Would  leave feeding tube decision to his wife   Social Determinants of Radio broadcast assistant Strain: Not on file  Food Insecurity: Not on file  Transportation Needs: Not on file  Physical Activity: Not on file  Stress: Not on file  Social Connections: Not on file  Intimate Partner Violence: Not on file   Review of Systems  Appetite is fine Weight is fairly stable Sleeps okay with CPAP--variable response Wears seat belt Teeth are okay--keeps up with dentist Uses inversion table for back and foot numbness (didn't help this) No suspicious skin lesions---does have knot on his chin to be checked Bowels move okay. Occasional blood on toilet paper Voids okay--flow is okay. Nocturia x 1 (occ more) No sig joint swelling. Gets aches and pains    Objective:   Physical Exam Constitutional:      Appearance: Normal appearance.  HENT:     Mouth/Throat:     Comments: No lesions Eyes:     Conjunctiva/sclera: Conjunctivae normal.     Pupils: Pupils are equal, round, and reactive to light.  Cardiovascular:     Rate and Rhythm: Normal rate and regular rhythm.     Pulses: Normal pulses.     Heart sounds: No murmur heard. No gallop.   Pulmonary:     Effort: Pulmonary effort is normal.     Breath sounds: Normal breath sounds. No wheezing or rales.  Abdominal:     Palpations: Abdomen is soft.     Tenderness: There is no abdominal tenderness.  Musculoskeletal:     Cervical back: Neck supple.     Right lower leg: No edema.      Left lower leg: No edema.  Lymphadenopathy:     Cervical: No cervical adenopathy.  Skin:    General: Skin is warm.     Findings: No rash.     Comments: Benign 45mm papule on chin  Neurological:     Mental Status: He is alert and oriented to person, place, and time.     Comments: President--- "Waymond Cera, Obama" [phone] [id number] Recall 3/3  Psychiatric:        Mood and Affect: Mood normal.        Behavior: Behavior normal.            Assessment & Plan:

## 2020-05-16 NOTE — Assessment & Plan Note (Signed)
Is on losartan 

## 2020-05-16 NOTE — Progress Notes (Signed)
Hearing Screening   125Hz  250Hz  500Hz  1000Hz  2000Hz  3000Hz  4000Hz  6000Hz  8000Hz   Right ear:   20 20 20   0    Left ear:   20 20 20   0    Vision Screening Comments: August 2021

## 2020-05-16 NOTE — Assessment & Plan Note (Signed)
Due for follow up with Dr Oneida Alar next year

## 2020-05-16 NOTE — Assessment & Plan Note (Signed)
Recent reassuring work up On ARB, statin, ASA

## 2020-05-16 NOTE — Assessment & Plan Note (Signed)
I have personally reviewed the Medicare Annual Wellness questionnaire and have noted 1. The patient's medical and social history 2. Their use of alcohol, tobacco or illicit drugs 3. Their current medications and supplements 4. The patient's functional ability including ADL's, fall risks, home safety risks and hearing or visual             impairment. 5. Diet and physical activities 6. Evidence for depression or mood disorders  The patients weight, height, BMI and visual acuity have been recorded in the chart I have made referrals, counseling and provided education to the patient based review of the above and I have provided the pt with a written personalized care plan for preventive services.  I have provided you with a copy of your personalized plan for preventive services. Please take the time to review along with your updated medication list.  Due for colon next year No PSA due to age Discussed resistance training---especially for legs COVID booster and flu vaccine in the fall

## 2020-05-16 NOTE — Assessment & Plan Note (Signed)
On CT scan On ASA and statin

## 2020-05-16 NOTE — Assessment & Plan Note (Signed)
Will need testing again next year Is on statin

## 2020-05-16 NOTE — Patient Instructions (Signed)
I would recommend cetirizine 10mg  daily or loratadine 10mg -- 1-2 daily for your allergy symptoms.

## 2020-05-16 NOTE — Assessment & Plan Note (Signed)
Uses the CPAP every night 

## 2020-05-16 NOTE — Assessment & Plan Note (Signed)
No change off statin Will recheck labs Fass need walking stick for balance

## 2020-05-19 LAB — PROTEIN ELECTROPHORESIS, SERUM, WITH REFLEX
Albumin ELP: 4.4 g/dL (ref 3.8–4.8)
Alpha 1: 0.3 g/dL (ref 0.2–0.3)
Alpha 2: 0.6 g/dL (ref 0.5–0.9)
Beta 2: 0.3 g/dL (ref 0.2–0.5)
Beta Globulin: 0.5 g/dL (ref 0.4–0.6)
Gamma Globulin: 1.4 g/dL (ref 0.8–1.7)
Total Protein: 7.5 g/dL (ref 6.1–8.1)

## 2020-05-26 ENCOUNTER — Other Ambulatory Visit: Payer: Self-pay | Admitting: Internal Medicine

## 2020-07-03 ENCOUNTER — Other Ambulatory Visit: Payer: Self-pay | Admitting: Internal Medicine

## 2020-07-30 DIAGNOSIS — Z03818 Encounter for observation for suspected exposure to other biological agents ruled out: Secondary | ICD-10-CM | POA: Diagnosis not present

## 2020-07-30 DIAGNOSIS — Z20822 Contact with and (suspected) exposure to covid-19: Secondary | ICD-10-CM | POA: Diagnosis not present

## 2020-08-04 ENCOUNTER — Telehealth: Payer: Self-pay

## 2020-08-04 DIAGNOSIS — Z20822 Contact with and (suspected) exposure to covid-19: Secondary | ICD-10-CM | POA: Diagnosis not present

## 2020-08-04 DIAGNOSIS — Z03818 Encounter for observation for suspected exposure to other biological agents ruled out: Secondary | ICD-10-CM | POA: Diagnosis not present

## 2020-08-04 MED ORDER — NIRMATRELVIR/RITONAVIR (PAXLOVID) TABLET (RENAL DOSING): 2.0000 | ORAL_TABLET | Freq: Two times a day (BID) | ORAL | 0 refills | Status: AC

## 2020-08-04 NOTE — Telephone Encounter (Signed)
Spoke to him via phone Wife already improving on the paxlovid and she is on chemotherapy (on hold) Discussed that it just has EUA and the potential side effects---and he wishes to proceed.  GFR in 50's---will Rx renal dose Asked him to stop the rosuvastatin immediately and not restart for 5 days after he is done with the paxlovid

## 2020-08-04 NOTE — Telephone Encounter (Signed)
Patient called stating that he tested positive for COVID today. Started not feeling well on 08/02/20, symptoms progressed now. Symptoms are: nasal congestion, runny nose, cough, sneezing, sore throat. No fever.  His wife tested positive for COVID on 07/30/20 and sees Dr Glori Bickers and was put on Paxlovid on 07/31/20.

## 2020-09-20 ENCOUNTER — Other Ambulatory Visit: Payer: Self-pay | Admitting: Internal Medicine

## 2020-09-24 DIAGNOSIS — H2513 Age-related nuclear cataract, bilateral: Secondary | ICD-10-CM | POA: Diagnosis not present

## 2020-12-10 DIAGNOSIS — I2510 Atherosclerotic heart disease of native coronary artery without angina pectoris: Secondary | ICD-10-CM | POA: Diagnosis not present

## 2020-12-10 DIAGNOSIS — G4733 Obstructive sleep apnea (adult) (pediatric): Secondary | ICD-10-CM | POA: Diagnosis not present

## 2020-12-10 DIAGNOSIS — N1831 Chronic kidney disease, stage 3a: Secondary | ICD-10-CM | POA: Diagnosis not present

## 2020-12-10 DIAGNOSIS — Z7982 Long term (current) use of aspirin: Secondary | ICD-10-CM | POA: Diagnosis not present

## 2020-12-10 DIAGNOSIS — I129 Hypertensive chronic kidney disease with stage 1 through stage 4 chronic kidney disease, or unspecified chronic kidney disease: Secondary | ICD-10-CM | POA: Diagnosis not present

## 2021-01-09 DIAGNOSIS — Z20822 Contact with and (suspected) exposure to covid-19: Secondary | ICD-10-CM | POA: Diagnosis not present

## 2021-01-09 DIAGNOSIS — Z03818 Encounter for observation for suspected exposure to other biological agents ruled out: Secondary | ICD-10-CM | POA: Diagnosis not present

## 2021-01-15 DIAGNOSIS — Z03818 Encounter for observation for suspected exposure to other biological agents ruled out: Secondary | ICD-10-CM | POA: Diagnosis not present

## 2021-01-15 DIAGNOSIS — Z20822 Contact with and (suspected) exposure to covid-19: Secondary | ICD-10-CM | POA: Diagnosis not present

## 2021-01-21 ENCOUNTER — Other Ambulatory Visit: Payer: Self-pay | Admitting: Cardiology

## 2021-01-28 DIAGNOSIS — Z20822 Contact with and (suspected) exposure to covid-19: Secondary | ICD-10-CM | POA: Diagnosis not present

## 2021-01-28 DIAGNOSIS — Z03818 Encounter for observation for suspected exposure to other biological agents ruled out: Secondary | ICD-10-CM | POA: Diagnosis not present

## 2021-01-30 ENCOUNTER — Telehealth: Payer: Self-pay | Admitting: Internal Medicine

## 2021-01-30 NOTE — Telephone Encounter (Signed)
Spoke to pt. He is quarantined in a room. Will start vitamin C, Zinc. Will go get vit D. Will Wear a mask out in the house.

## 2021-01-30 NOTE — Telephone Encounter (Signed)
Pt called stating that he tested positive for covid on 01/28/21. Pt states that his wife has MDS and pt is concern about her catching covid again. Pt would like a call back to discuss what precaution he should be taking. Please advise

## 2021-01-30 NOTE — Telephone Encounter (Signed)
Pt would like for you to call him on his cell phone [phone]

## 2021-02-04 DIAGNOSIS — Z03818 Encounter for observation for suspected exposure to other biological agents ruled out: Secondary | ICD-10-CM | POA: Diagnosis not present

## 2021-02-04 DIAGNOSIS — Z20822 Contact with and (suspected) exposure to covid-19: Secondary | ICD-10-CM | POA: Diagnosis not present

## 2021-02-18 DIAGNOSIS — D2271 Melanocytic nevi of right lower limb, including hip: Secondary | ICD-10-CM | POA: Diagnosis not present

## 2021-02-18 DIAGNOSIS — Z85828 Personal history of other malignant neoplasm of skin: Secondary | ICD-10-CM | POA: Diagnosis not present

## 2021-02-18 DIAGNOSIS — L570 Actinic keratosis: Secondary | ICD-10-CM | POA: Diagnosis not present

## 2021-02-18 DIAGNOSIS — D2272 Melanocytic nevi of left lower limb, including hip: Secondary | ICD-10-CM | POA: Diagnosis not present

## 2021-02-18 DIAGNOSIS — D2261 Melanocytic nevi of right upper limb, including shoulder: Secondary | ICD-10-CM | POA: Diagnosis not present

## 2021-03-25 DIAGNOSIS — N1831 Chronic kidney disease, stage 3a: Secondary | ICD-10-CM | POA: Diagnosis not present

## 2021-03-25 DIAGNOSIS — G4733 Obstructive sleep apnea (adult) (pediatric): Secondary | ICD-10-CM | POA: Diagnosis not present

## 2021-03-25 DIAGNOSIS — G629 Polyneuropathy, unspecified: Secondary | ICD-10-CM | POA: Diagnosis not present

## 2021-03-25 DIAGNOSIS — I739 Peripheral vascular disease, unspecified: Secondary | ICD-10-CM | POA: Diagnosis not present

## 2021-03-25 DIAGNOSIS — I129 Hypertensive chronic kidney disease with stage 1 through stage 4 chronic kidney disease, or unspecified chronic kidney disease: Secondary | ICD-10-CM | POA: Diagnosis not present

## 2021-03-25 DIAGNOSIS — I2510 Atherosclerotic heart disease of native coronary artery without angina pectoris: Secondary | ICD-10-CM | POA: Diagnosis not present

## 2021-04-21 ENCOUNTER — Other Ambulatory Visit: Payer: Self-pay | Admitting: Cardiology

## 2021-05-22 ENCOUNTER — Ambulatory Visit (INDEPENDENT_AMBULATORY_CARE_PROVIDER_SITE_OTHER): Payer: PPO | Admitting: Internal Medicine

## 2021-05-22 ENCOUNTER — Encounter: Payer: Self-pay | Admitting: Internal Medicine

## 2021-05-22 VITALS — BP 130/84 | HR 70 | Temp 98.0°F | Ht 68.0 in | Wt 177.0 lb

## 2021-05-22 DIAGNOSIS — N1831 Chronic kidney disease, stage 3a: Secondary | ICD-10-CM

## 2021-05-22 DIAGNOSIS — Z0000 Encounter for general adult medical examination without abnormal findings: Secondary | ICD-10-CM

## 2021-05-22 DIAGNOSIS — G629 Polyneuropathy, unspecified: Secondary | ICD-10-CM | POA: Diagnosis not present

## 2021-05-22 DIAGNOSIS — I10 Essential (primary) hypertension: Secondary | ICD-10-CM | POA: Diagnosis not present

## 2021-05-22 DIAGNOSIS — I728 Aneurysm of other specified arteries: Secondary | ICD-10-CM

## 2021-05-22 DIAGNOSIS — I700 Atherosclerosis of aorta: Secondary | ICD-10-CM | POA: Diagnosis not present

## 2021-05-22 DIAGNOSIS — I722 Aneurysm of renal artery: Secondary | ICD-10-CM | POA: Diagnosis not present

## 2021-05-22 LAB — RENAL FUNCTION PANEL
Albumin: 4.5 g/dL (ref 3.5–5.2)
BUN: 26 mg/dL — ABNORMAL HIGH (ref 6–23)
CO2: 30 mEq/L (ref 19–32)
Calcium: 9.8 mg/dL (ref 8.4–10.5)
Chloride: 102 mEq/L (ref 96–112)
Creatinine, Ser: 1.4 mg/dL (ref 0.40–1.50)
GFR: 49.83 mL/min — ABNORMAL LOW (ref >60.00)
Glucose, Bld: 96 mg/dL (ref 70–99)
Phosphorus: 3.1 mg/dL (ref 2.3–4.6)
Potassium: 4.2 mEq/L (ref 3.5–5.1)
Sodium: 138 mEq/L (ref 135–145)

## 2021-05-22 LAB — CBC
HCT: 43 % (ref 39.0–52.0)
Hemoglobin: 14.6 g/dL (ref 13.0–17.0)
MCHC: 33.9 g/dL (ref 30.0–36.0)
MCV: 95.3 fl (ref 78.0–100.0)
Platelets: 209 10*3/uL (ref 150.0–400.0)
RBC: 4.51 Mil/uL (ref 4.22–5.81)
RDW: 13.3 % (ref 11.5–15.5)
WBC: 4.7 10*3/uL (ref 4.0–10.5)

## 2021-05-22 LAB — HEPATIC FUNCTION PANEL
ALT: 18 U/L (ref 0–53)
AST: 20 U/L (ref 0–37)
Albumin: 4.5 g/dL (ref 3.5–5.2)
Alkaline Phosphatase: 64 U/L (ref 39–117)
Bilirubin, Direct: 0.1 mg/dL (ref 0.0–0.3)
Total Bilirubin: 0.7 mg/dL (ref 0.2–1.2)
Total Protein: 7.8 g/dL (ref 6.0–8.3)

## 2021-05-22 LAB — LIPID PANEL
Cholesterol: 151 mg/dL (ref 0–200)
HDL: 59.3 mg/dL (ref >39.00)
LDL Cholesterol: 78 mg/dL (ref 0–99)
NonHDL: 91.57
Total CHOL/HDL Ratio: 3
Triglycerides: 67 mg/dL (ref 0.0–149.0)
VLDL: 13.4 mg/dL (ref 0.0–40.0)

## 2021-05-22 NOTE — Assessment & Plan Note (Signed)
Will go back for vascular eval this year ?On rosuvastatin '10mg'$  daily and ASA 81 ?

## 2021-05-22 NOTE — Assessment & Plan Note (Signed)
BP Readings from Last 3 Encounters:  ?05/22/21 130/84  ?05/16/20 122/82  ?03/26/20 128/76  ? ?Good control on losartan '50mg'$  daily ?Will check labs ?

## 2021-05-22 NOTE — Assessment & Plan Note (Signed)
Did well with special clinic Rx ?

## 2021-05-22 NOTE — Progress Notes (Signed)
Hearing Screening - Comments:: Passed whisper test °Vision Screening - Comments:: August 2022 ° °

## 2021-05-22 NOTE — Progress Notes (Signed)
? ?Subjective:  ? ? Patient ID: Randall Reeves, male    DOB: Mar 28, 1945, 76 y.o.   MRN: [id number] ? ?HPI ?Here for Medicare wellness visit and follow up of chronic health conditions ?Reviewed advanced directives ?Reviewed other doctors---Dr Dingledein---ophthal, Dr Lenis Noon, Dr Su Monks, Dr Lanell Persons ?No hospitalizations or surgery in the past year ?Vision is okay but mild decline--early cataracts ?Hearing is fine--wife is concerned though ?No tobacco ?Rare alcohol ?No falls--but does have some balance issues soon (going to neuropathy clinic) ?No depression or anhedonia ?Is exercising regularly now---has garden, works on car, yard work, Social research officer, government ?Independent with instrumental ADLs ?Mild memory issues---not concerning ? ?He is wondered about changes in treatment for heart conditions ?Concerned about possible over treatment of BP, etc ?He has lost weight---is eating better ?Trying to stay fit ? ?He has rarely checked his BP--mostly if he doesn't feel good ?Tended to be "low"--but doesn't know the numbers (like 100/60) ?No chest pain ?Stable DOE and leg pain---like walking up a hill (improved since weight loss) ?Mild dizzy feeling--no syncope ?No edema ?Rare dull headaches--relates to pollen/allergies ? ?Last GFR stable at 53 ? ?No heartburn on omeprazole ?No dysphagia ? ?Has the known atherosclerosis and aneurysms (related to past trauma) ?Is due for vasculare eval this year ? ?Current Outpatient Medications on File Prior to Visit  ?Medication Sig Dispense Refill  ? acetaminophen (TYLENOL) 500 MG tablet Take 1,000 mg by mouth every 8 (eight) hours as needed.    ? aspirin EC 81 MG tablet Take 81 mg by mouth daily.    ? losartan (COZAAR) 50 MG tablet TAKE 1 TABLET BY MOUTH ONCE DAILY 90 tablet 3  ? LUTEIN PO Take 1 tablet by mouth every evening.    ? Multiple Vitamin (MULTIVITAMIN) tablet Take 1 tablet by mouth daily.    ? omeprazole (PRILOSEC) 20 MG capsule TAKE 1 CAPSULE BY MOUTH TWICE DAILY 180 capsule 3  ? OVER THE  COUNTER MEDICATION Chlortrimetone    ? rosuvastatin (CRESTOR) 10 MG tablet Take 1 tablet (10 mg total) by mouth daily. Please schedule appt for future refills. 1st attempt 90 tablet 0  ? ?No current facility-administered medications on file prior to visit.  ? ? ?Allergies  ?Allergen Reactions  ? Bisoprolol-Hydrochlorothiazide   ?  REACTION: muscle pain, cramps  ? Codeine Nausea Only  ? Imipramine Other (See Comments)  ?  Dry mouth, dizziness, headaches, increased sweating   ? Tamsulosin Other (See Comments)  ?  Dizziness ?  ? ? ?Past Medical History:  ?Diagnosis Date  ? Allergy   ? Arthritis   ? CKD (chronic kidney disease), stage III ([place])   ? Coronary artery disease, non-occlusive 09/2009  ? Minimal CAD / no flow-limiting lesions on Cath; CPX February 2022: 1200 exercise, reached 91% image PRN.  Peak VO2 26.5-normal= excellent functional capacity.  High anaerobic threshold suggest excellent trending effect.  At peak exercise, limited by obesity.:  Normal CPX  ? GERD (gastroesophageal reflux disease)   ? Hyperlipidemia   ? Hypertension   ? Myocardial infarction - Type 2a MI 2015  ?  Felt to be Type II MI-demand infarct  ? Obstructive sleep apnea   ? Cpap as of 27 days ago  ? Sleep apnea   ? wears cpap   ? Syncope   ? due to hypotension from BP meds   ? ? ?Past Surgical History:  ?Procedure Laterality Date  ? CARDIOPULMONARY STRESS TEST  03/17/2020  ? PreEx PFTs: FVC 4.33 (105%), FEV1  3.23 (104%) FEV1/FVC 75 (98% ) MVV 90 (73%): NORMAL; Exercise 12 min;hip and knee fatigue and mild dyspnea.  Rest HR 80 bpm-> max HR 135 bpm (91% of MPHR); BP 124/76 up to 166/74 mmHg; good exercise effort. Peak VO2 26.5 (108% predicted); VE/VCO2 slope 30.  Peak RER 1.1, ventilator threshold 20.6 (84% predicted, 78% measured peak VO2).  Pulse ox remained 94 to 99%  ? CARPAL TUNNEL RELEASE Left 02/2013  ? COLONOSCOPY    ? INGUINAL HERNIA REPAIR Left 1983  ? with vasectomy  ? LEFT HEART CATH AND CORONARY ANGIOGRAPHY  10/20/2009  ? (In  the setting of MI with trivial troponin elevation). LM normal --<LAD, LCx. LAD (2 small Diags) - no significant disease, LCx (co-dom with PDA) & 2 OMs (OM2 moderate size with faint "hazy" defect - not flow limiting. Co-dom RCA with minor irregularities.  EF ~60%. ? Mild Inf HK. --> Med Rx.  Felt to be Type II MI-demand infarct  ? POLYPECTOMY    ? SPERMATOCELECTOMY  1998  ? TRANSTHORACIC ECHOCARDIOGRAM  10/2012; 12/2015  ? a) EF 55 to 60%.  GR 1 DD.  Normal motion.  Normal valves.;; b) Normal LV size and function.  EF 55 to 60%.  GR 2 DD.  Normal valves.  ? VASECTOMY  1983  ? with inguinal hernia repair  ? ? ?Family History  ?Problem Relation Age of Onset  ? Stroke Mother   ? Hypertension Mother   ? Diabetes Father   ? Asthma Father   ? Heart failure Father   ? Heart disease Father   ? Hypertension Sister   ? Diabetes Sister   ? Neuropathy Brother   ? Hypertension Brother   ? Diabetes Brother   ? Heart disease Brother   ? Prostate cancer Brother   ? Hypertension Brother   ? Colon polyps Brother   ? Hypertension Brother   ? Hypertension Brother   ? Colon cancer Neg Hx   ? Rectal cancer Neg Hx   ? Stomach cancer Neg Hx   ? Esophageal cancer Neg Hx   ? ? ?Social History  ? ?Socioeconomic History  ? Marital status: Married  ?  Spouse name: Not on file  ? Number of children: 2  ? Years of education: Not on file  ? Highest education level: Not on file  ?Occupational History  ? Occupation: landscaping  ?Tobacco Use  ? Smoking status: Never  ?  Passive exposure: Past  ? Smokeless tobacco: Never  ?Vaping Use  ? Vaping Use: Never used  ?Substance and Sexual Activity  ? Alcohol use: Yes  ?  Alcohol/week: 3.0 - 5.0 standard drinks  ?  Types: 3 - 5 Cans of beer per week  ?  Comment:  a few beers in a week.  ? Drug use: No  ? Sexual activity: Not on file  ?Other Topics Concern  ? Not on file  ?Social History Narrative  ? Has living will  ? Wife is his health care POA--alternate is son  ? Would accept resuscitation attempts.  ?  Would leave feeding tube decision to his wife  ? ?Social Determinants of Health  ? ?Financial Resource Strain: Not on file  ?Food Insecurity: Not on file  ?Transportation Needs: Not on file  ?Physical Activity: Not on file  ?Stress: Not on file  ?Social Connections: Not on file  ?Intimate Partner Violence: Not on file  ? ?Review of Systems ?Appetite is very good--but being careful ?Has lost ~20#  in the past year ?Wakes at 2-3AM---hard to get back to sleep. Does take afternoon nap--is refreshing. Initiates sleep ~10 ?Wears seat belt ?Teeth okay---keeps up with dentist ?Takes claritin at times for drainage--not often ?Recent tick bite--some inflammation ?Bowels were slow on the special neuropathy diet---better now ?Mild knee and thoracic back pain---achy. Uses heating pad with success ?   ?Objective:  ? Physical Exam ?Constitutional:   ?   Appearance: Normal appearance.  ?HENT:  ?   Mouth/Throat:  ?   Comments: No lesions ?Eyes:  ?   Conjunctiva/sclera: Conjunctivae normal.  ?   Pupils: Pupils are equal, round, and reactive to light.  ?Cardiovascular:  ?   Rate and Rhythm: Normal rate and regular rhythm.  ?   Pulses: Normal pulses.  ?   Heart sounds: No murmur heard. ?  No gallop.  ?Pulmonary:  ?   Effort: Pulmonary effort is normal.  ?   Breath sounds: Normal breath sounds. No wheezing or rales.  ?Abdominal:  ?   Palpations: Abdomen is soft.  ?   Tenderness: There is no abdominal tenderness.  ?Musculoskeletal:  ?   Cervical back: Neck supple.  ?   Right lower leg: No edema.  ?   Left lower leg: No edema.  ?Lymphadenopathy:  ?   Cervical: No cervical adenopathy.  ?Skin: ?   Findings: No rash.  ?   Comments: Left hip tick bite not infected  ?Neurological:  ?   General: No focal deficit present.  ?   Mental Status: He is alert and oriented to person, place, and time.  ?   Comments: Mini-cog normal  ?Psychiatric:     ?   Mood and Affect: Mood normal.     ?   Behavior: Behavior normal.  ?  ? ? ? ? ?   ?Assessment & Plan:   ? ?

## 2021-05-22 NOTE — Assessment & Plan Note (Signed)
On statin and ASA. 

## 2021-05-22 NOTE — Assessment & Plan Note (Signed)
Will get imaged again this year ?

## 2021-05-22 NOTE — Assessment & Plan Note (Signed)
Stable GFR on losartan '50mg'$  ?

## 2021-05-22 NOTE — Assessment & Plan Note (Signed)
I have personally reviewed the Medicare Annual Wellness questionnaire and have noted ?1. The patient's medical and social history ?2. Their use of alcohol, tobacco or illicit drugs ?3. Their current medications and supplements ?4. The patient's functional ability including ADL's, fall risks, home safety risks and hearing or visual ?            impairment. ?5. Diet and physical activities ?6. Evidence for depression or mood disorders ? ?The patients weight, height, BMI and visual acuity have been recorded in the chart ?I have made referrals, counseling and provided education to the patient based review of the above and I have provided the pt with a written personalized care plan for preventive services. ? ?I have provided you with a copy of your personalized plan for preventive services. Please take the time to review along with your updated medication list. ? ?Due for colonoscopy this year ?No PSA due to age ?Doing much better on fitness/lifestyle ?COVID booster and flu vaccine by the fall ? ?

## 2021-07-09 ENCOUNTER — Other Ambulatory Visit: Payer: Self-pay | Admitting: Internal Medicine

## 2021-08-24 ENCOUNTER — Other Ambulatory Visit: Payer: Self-pay | Admitting: Cardiology

## 2021-09-10 DIAGNOSIS — Z6826 Body mass index (BMI) 26.0-26.9, adult: Secondary | ICD-10-CM | POA: Diagnosis not present

## 2021-09-10 DIAGNOSIS — I10 Essential (primary) hypertension: Secondary | ICD-10-CM | POA: Diagnosis not present

## 2021-09-10 DIAGNOSIS — I739 Peripheral vascular disease, unspecified: Secondary | ICD-10-CM | POA: Diagnosis not present

## 2021-09-10 DIAGNOSIS — I2510 Atherosclerotic heart disease of native coronary artery without angina pectoris: Secondary | ICD-10-CM | POA: Diagnosis not present

## 2021-09-10 DIAGNOSIS — G629 Polyneuropathy, unspecified: Secondary | ICD-10-CM | POA: Diagnosis not present

## 2021-09-10 DIAGNOSIS — Z7982 Long term (current) use of aspirin: Secondary | ICD-10-CM | POA: Diagnosis not present

## 2021-09-10 DIAGNOSIS — G4733 Obstructive sleep apnea (adult) (pediatric): Secondary | ICD-10-CM | POA: Diagnosis not present

## 2021-10-06 DIAGNOSIS — H40003 Preglaucoma, unspecified, bilateral: Secondary | ICD-10-CM | POA: Diagnosis not present

## 2021-10-13 ENCOUNTER — Encounter: Payer: Self-pay | Admitting: Gastroenterology

## 2021-10-21 ENCOUNTER — Encounter: Payer: Self-pay | Admitting: Gastroenterology

## 2021-11-02 ENCOUNTER — Ambulatory Visit (AMBULATORY_SURGERY_CENTER): Payer: Self-pay

## 2021-11-02 ENCOUNTER — Other Ambulatory Visit: Payer: Self-pay

## 2021-11-02 VITALS — Ht 68.0 in | Wt 184.6 lb

## 2021-11-02 DIAGNOSIS — Z86010 Personal history of colonic polyps: Secondary | ICD-10-CM

## 2021-11-02 MED ORDER — PEG 3350-KCL-NA BICARB-NACL 420 G PO SOLR: 4000.0000 mL | Freq: Once | ORAL | 0 refills | Status: AC

## 2021-11-02 NOTE — Progress Notes (Signed)
Denies allergies to eggs or soy products. Denies complication of anesthesia or sedation. Denies use of weight loss medication. Denies use of O2.   Emmi instructions given for colonoscopy.  

## 2021-11-18 ENCOUNTER — Encounter: Payer: Self-pay | Admitting: Gastroenterology

## 2021-11-19 ENCOUNTER — Other Ambulatory Visit: Payer: Self-pay | Admitting: Cardiology

## 2021-11-26 ENCOUNTER — Ambulatory Visit (AMBULATORY_SURGERY_CENTER): Payer: PPO | Admitting: Gastroenterology

## 2021-11-26 ENCOUNTER — Encounter: Payer: Self-pay | Admitting: Gastroenterology

## 2021-11-26 VITALS — BP 124/66 | HR 81 | Temp 97.5°F | Resp 14 | Ht 68.0 in | Wt 184.6 lb

## 2021-11-26 DIAGNOSIS — G4733 Obstructive sleep apnea (adult) (pediatric): Secondary | ICD-10-CM | POA: Diagnosis not present

## 2021-11-26 DIAGNOSIS — Z86010 Personal history of colonic polyps: Secondary | ICD-10-CM | POA: Diagnosis not present

## 2021-11-26 DIAGNOSIS — I2510 Atherosclerotic heart disease of native coronary artery without angina pectoris: Secondary | ICD-10-CM | POA: Diagnosis not present

## 2021-11-26 DIAGNOSIS — Z09 Encounter for follow-up examination after completed treatment for conditions other than malignant neoplasm: Secondary | ICD-10-CM | POA: Diagnosis not present

## 2021-11-26 DIAGNOSIS — N183 Chronic kidney disease, stage 3 unspecified: Secondary | ICD-10-CM | POA: Diagnosis not present

## 2021-11-26 DIAGNOSIS — I10 Essential (primary) hypertension: Secondary | ICD-10-CM | POA: Diagnosis not present

## 2021-11-26 MED ORDER — SODIUM CHLORIDE 0.9 % IV SOLN: 500.0000 mL | Freq: Once | INTRAVENOUS | Status: DC

## 2021-11-26 NOTE — Patient Instructions (Signed)
Handout on diverticulosis given to you today   YOU HAD AN ENDOSCOPIC PROCEDURE TODAY AT [facility]:   Refer to the procedure report that was given to you for any specific questions about what was found during the examination.  If the procedure report does not answer your questions, please call your gastroenterologist to clarify.  If you requested that your care partner not be given the details of your procedure findings, then the procedure report has been included in a sealed envelope for you to review at your convenience later.  YOU SHOULD EXPECT: Some feelings of bloating in the abdomen. Passage of more gas than usual.  Walking can help get rid of the air that was put into your GI tract during the procedure and reduce the bloating. If you had a lower endoscopy (such as a colonoscopy or flexible sigmoidoscopy) you [place] notice spotting of blood in your stool or on the toilet paper. If you underwent a bowel prep for your procedure, you Coby not have a normal bowel movement for a few days.  Please Note:  You might notice some irritation and congestion in your nose or some drainage.  This is from the oxygen used during your procedure.  There is no need for concern and it should clear up in a day or so.  SYMPTOMS TO REPORT IMMEDIATELY:  Following lower endoscopy (colonoscopy or flexible sigmoidoscopy):  Excessive amounts of blood in the stool  Significant tenderness or worsening of abdominal pains  Swelling of the abdomen that is new, acute  Fever of 100F or higher  For urgent or emergent issues, a gastroenterologist can be reached at any hour by calling [phone]. Do not use MyChart messaging for urgent concerns.    DIET:  We do recommend a small meal at first, but then you Darrington proceed to your regular diet.  Drink plenty of fluids but you should avoid alcoholic beverages for 24 hours.  ACTIVITY:  You should plan to take it easy for the rest of today and you should NOT DRIVE  or use heavy machinery until tomorrow (because of the sedation medicines used during the test).    FOLLOW UP: Our staff will call the number listed on your records the next business day following your procedure.  We will call around 7:15- 8:00 am to check on you and address any questions or concerns that you Lorensen have regarding the information given to you following your procedure. If we do not reach you, we will leave a message.      SIGNATURES/CONFIDENTIALITY: You and/or your care partner have signed paperwork which will be entered into your electronic medical record.  These signatures attest to the fact that that the information above on your After Visit Summary has been reviewed and is understood.  Full responsibility of the confidentiality of this discharge information lies with you and/or your care-partner.

## 2021-11-26 NOTE — Progress Notes (Signed)
Pt's states no medical or surgical changes since previsit or office visit. 

## 2021-11-26 NOTE — Progress Notes (Signed)
History and Physical:  This patient presents for endoscopic testing for: Encounter Diagnosis  Name Primary?   Personal history of colonic polyps Yes    76 year old man here for surveillance colonoscopy.  August 2021, 2 right colon SSP under 10 mm June 2020, 3 right colon SSP 8, one greater than 10 mm April 2019, multiple SSP, one removed piecemeal Patient denies chronic abdominal pain, rectal bleeding, constipation or diarrhea.  Patient is otherwise without complaints or active issues today.   Past Medical History: Past Medical History:  Diagnosis Date   Allergy    Arthritis    Cataract    CKD (chronic kidney disease), stage III ([place])    Coronary artery disease, non-occlusive 09/2009   Minimal CAD / no flow-limiting lesions on Cath; CPX February 2022: 1200 exercise, reached 91% image PRN.  Peak VO2 26.5-normal= excellent functional capacity.  High anaerobic threshold suggest excellent trending effect.  At peak exercise, limited by obesity.:  Normal CPX   GERD (gastroesophageal reflux disease)    Hyperlipidemia    Hypertension    Myocardial infarction - Type 2a MI 2015    Felt to be Type II MI-demand infarct   Neuromuscular disorder ([facility])    Obstructive sleep apnea    Cpap as of 27 days ago   Sleep apnea    wears cpap    Syncope    due to hypotension from BP meds      Past Surgical History: Past Surgical History:  Procedure Laterality Date   CARDIOPULMONARY STRESS TEST  03/17/2020   PreEx PFTs: FVC 4.33 (105%), FEV1 3.23 (104%) FEV1/FVC 75 (98% ) MVV 90 (73%): NORMAL; Exercise 12 min;hip and knee fatigue and mild dyspnea.  Rest HR 80 bpm-> max HR 135 bpm (91% of MPHR); BP 124/76 up to 166/74 mmHg; good exercise effort. Peak VO2 26.5 (108% predicted); VE/VCO2 slope 30.  Peak RER 1.1, ventilator threshold 20.6 (84% predicted, 78% measured peak VO2).  Pulse ox remained 94 to 99%   CARPAL TUNNEL RELEASE Left 02/2013   COLONOSCOPY     INGUINAL HERNIA REPAIR Left 1983   with  vasectomy   LEFT HEART CATH AND CORONARY ANGIOGRAPHY  10/20/2009   (In the setting of MI with trivial troponin elevation). LM normal --<LAD, LCx. LAD (2 small Diags) - no significant disease, LCx (co-dom with PDA) & 2 OMs (OM2 moderate size with faint "hazy" defect - not flow limiting. Co-dom RCA with minor irregularities.  EF ~60%. ? Mild Inf HK. --> Med Rx.  Felt to be Type II MI-demand infarct   POLYPECTOMY     SPERMATOCELECTOMY  1998   TRANSTHORACIC ECHOCARDIOGRAM  10/2012; 12/2015   a) EF 55 to 60%.  GR 1 DD.  Normal motion.  Normal valves.;; b) Normal LV size and function.  EF 55 to 60%.  GR 2 DD.  Normal valves.   VASECTOMY  1983   with inguinal hernia repair    Allergies: Allergies  Allergen Reactions   Bisoprolol-Hydrochlorothiazide     REACTION: muscle pain, cramps   Codeine Nausea Only   Imipramine Other (See Comments)    Dry mouth, dizziness, headaches, increased sweating    Tamsulosin Other (See Comments)    Dizziness     Outpatient Meds: Current Outpatient Medications  Medication Sig Dispense Refill   aspirin EC 81 MG tablet Take 81 mg by mouth daily.     losartan (COZAAR) 50 MG tablet TAKE 1 TABLET BY MOUTH ONCE DAILY 90 tablet 3   LUTEIN  PO Take 1 tablet by mouth every evening.     Multiple Vitamin (MULTIVITAMIN) tablet Take 1 tablet by mouth daily.     omeprazole (PRILOSEC) 20 MG capsule TAKE 1 CAPSULE BY MOUTH TWICE DAILY 180 capsule 3   OVER THE COUNTER MEDICATION Super Beta Prostate, One gel cap daily.     rosuvastatin (CRESTOR) 10 MG tablet TAKE 1 TABLET BY MOUTH ONCE DAILY (PATIENT MUST SCHEDULE ANNUAL APPT FOR REFILLS) 60 tablet 0   acetaminophen (TYLENOL) 500 MG tablet Take 1,000 mg by mouth every 8 (eight) hours as needed.     OVER THE COUNTER MEDICATION Chlortrimetone (Patient not taking: Reported on 11/02/2021)     Current Facility-Administered Medications  Medication Dose Route Frequency Provider Last Rate Last Admin   0.9 %  sodium chloride infusion   500 mL Intravenous Once Nelida Meuse III, MD          ___________________________________________________________________ Objective   Exam:  BP 134/81   Pulse 100   Temp (!) 97.5 F (36.4 C) (Temporal)   Ht '5\' 8"'$  (1.727 m)   Wt 184 lb 9.6 oz (83.7 kg)   SpO2 98%   BMI 28.07 kg/m   CV: regular , S1/S2 Resp: clear to auscultation bilaterally, normal RR and effort noted GI: soft, no tenderness, with active bowel sounds.   Assessment: Encounter Diagnosis  Name Primary?   Personal history of colonic polyps Yes     Plan: Colonoscopy  The benefits and risks of the planned procedure were described in detail with the patient or (when appropriate) their health care proxy.  Risks were outlined as including, but not limited to, bleeding, infection, perforation, adverse medication reaction leading to cardiac or pulmonary decompensation, pancreatitis (if ERCP).  The limitation of incomplete mucosal visualization was also discussed.  No guarantees or warranties were given.    The patient is appropriate for an endoscopic procedure in the ambulatory setting.   - Wilfrid Lund, MD

## 2021-11-26 NOTE — Op Note (Signed)
[facility] Patient Name: Randall Reeves Procedure Date: 11/26/2021 7:32 AM MRN: [id number] Endoscopist: Mallie Mussel L. Loletha Carrow , MD, [id number] Age: 76 Referring MD:  Date of Birth: Aug 08, 1945 Gender: Male Account #: [id number] Procedure:                Colonoscopy Indications:              High risk colon cancer surveillance: Personal                            history of sessile serrated colon polyp (10 mm or                            greater in size)                           SSP x 3 (one > 69m) Aug 2021; multiple SSPs (one                            removed piecemeal) April 2019 Medicines:                Monitored Anesthesia Care Procedure:                Pre-Anesthesia Assessment:                           - Prior to the procedure, a History and Physical                            was performed, and patient medications and                            allergies were reviewed. The patient's tolerance of                            previous anesthesia was also reviewed. The risks                            and benefits of the procedure and the sedation                            options and risks were discussed with the patient.                            All questions were answered, and informed consent                            was obtained. Prior Anticoagulants: The patient has                            taken no anticoagulant or antiplatelet agents. ASA                            Grade Assessment: II - A patient with mild systemic  disease. After reviewing the risks and benefits,                            the patient was deemed in satisfactory condition to                            undergo the procedure.                           After obtaining informed consent, the colonoscope                            was passed under direct vision. Throughout the                            procedure, the patient's blood pressure, pulse, and                             oxygen saturations were monitored continuously. The                            Olympus CF-HQ190L ([id number]) Colonoscope was                            introduced through the anus and advanced to the the                            cecum, identified by appendiceal orifice and                            ileocecal valve. The colonoscopy was performed                            without difficulty. The patient tolerated the                            procedure well. The quality of the bowel                            preparation was excellent. The ileocecal valve,                            appendiceal orifice, and rectum were photographed. Scope In: 9:48:03 AM Scope Out: 10:00:01 AM Scope Withdrawal Time: 0 hours 8 minutes 40 seconds  Total Procedure Duration: 0 hours 11 minutes 58 seconds  Findings:                 The perianal and digital rectal examinations were                            normal.                           There is no endoscopic evidence of polyps in the  entire colon.                           A few small-mouthed diverticula were found in the                            left colon.                           The exam was otherwise without abnormality on                            direct and retroflexion views. Complications:            No immediate complications. Estimated Blood Loss:     Estimated blood loss: none. Impression:               - Diverticulosis in the left colon.                           - The examination was otherwise normal on direct                            and retroflexion views.                           - No specimens collected. Recommendation:           - Patient has a contact number available for                            emergencies. The signs and symptoms of potential                            delayed complications were discussed with the                            patient. Return to normal activities tomorrow.                             Written discharge instructions were provided to the                            patient.                           - Resume previous diet.                           - Continue present medications.                           - Repeat colonoscopy in 5 years for surveillance                            (evaluate patient at that point and discuss  risks/benefits). Randall Reeves L. Loletha Carrow, MD 11/26/2021 10:07:38 AM This report has been signed electronically.

## 2021-11-26 NOTE — Progress Notes (Signed)
Report to PACU, RN, vss, BBS= Clear.  

## 2021-11-27 ENCOUNTER — Telehealth: Payer: Self-pay

## 2021-11-27 NOTE — Telephone Encounter (Signed)
  Follow up Call-     11/26/2021    7:43 AM 09/10/2019    7:23 AM  Call back number  Post procedure Call Back phone  # [phone] [name]  Permission to leave phone message Yes Yes     Patient questions:  Do you have a fever, pain , or abdominal swelling? No. Pain Score  0 *  Have you tolerated food without any problems? Yes.    Have you been able to return to your normal activities? Yes.    Do you have any questions about your discharge instructions: Diet   No. Medications  No. Follow up visit  No.  Do you have questions or concerns about your Care? No.  Actions: * If pain score is 4 or above: No action needed, pain <4.

## 2022-01-11 ENCOUNTER — Telehealth: Payer: Self-pay | Admitting: Internal Medicine

## 2022-01-11 NOTE — Telephone Encounter (Signed)
  Encourage patient to contact the pharmacy for refills or they can request refills through [facility]:  Please schedule appointment if longer than 1 year  NEXT APPOINTMENT DATE:  MEDICATION:rosuvastatin (CRESTOR) 10 MG tablet   Is the patient out of medication?   PHARMACY:rosuvastatin (CRESTOR) 10 MG tablet   Let patient know to contact pharmacy at the end of the day to make sure medication is ready.  Please notify patient to allow 48-72 hours to process  CLINICAL FILLS OUT ALL BELOW:   LAST REFILL:  QTY:  REFILL DATE:    OTHER COMMENTS:    Okay for refill?  Please advise  Pt is requesting RX for 90 day

## 2022-01-12 MED ORDER — ROSUVASTATIN CALCIUM 10 MG PO TABS: ORAL_TABLET | ORAL | 1 refills | Status: DC

## 2022-01-12 NOTE — Telephone Encounter (Signed)
Rx sent electronically.  

## 2022-02-09 ENCOUNTER — Other Ambulatory Visit: Payer: Self-pay

## 2022-02-09 DIAGNOSIS — I700 Atherosclerosis of aorta: Secondary | ICD-10-CM

## 2022-02-22 ENCOUNTER — Telehealth: Payer: Self-pay | Admitting: Pulmonary Disease

## 2022-02-22 NOTE — Telephone Encounter (Signed)
Please advise on rx for new water tub

## 2022-02-22 NOTE — Telephone Encounter (Signed)
PT having issues w/His Cpap. Please call to advise. Has had CPAP since April of 2019.Makes a noise when he puts on his mask and adjusting the water tank does not help.   He called [facility]. He thinks he needs a new water tub. They told him he'd need [place] to call in an RX.   Pls call pt at # on file.

## 2022-02-23 NOTE — Telephone Encounter (Signed)
He has not been seen in the office since September 2021.  Okay to send order for new humidifier chamber for his CPAP, but he also needs to be scheduled for an ROV.

## 2022-02-24 DIAGNOSIS — L570 Actinic keratosis: Secondary | ICD-10-CM | POA: Diagnosis not present

## 2022-02-24 DIAGNOSIS — Z85828 Personal history of other malignant neoplasm of skin: Secondary | ICD-10-CM | POA: Diagnosis not present

## 2022-02-24 DIAGNOSIS — D2262 Melanocytic nevi of left upper limb, including shoulder: Secondary | ICD-10-CM | POA: Diagnosis not present

## 2022-02-24 DIAGNOSIS — X32XXXA Exposure to sunlight, initial encounter: Secondary | ICD-10-CM | POA: Diagnosis not present

## 2022-02-24 DIAGNOSIS — D225 Melanocytic nevi of trunk: Secondary | ICD-10-CM | POA: Diagnosis not present

## 2022-02-24 DIAGNOSIS — D2261 Melanocytic nevi of right upper limb, including shoulder: Secondary | ICD-10-CM | POA: Diagnosis not present

## 2022-02-24 NOTE — Telephone Encounter (Signed)
Called and spoke with pt and have gotten him scheduled for an appt with Joellen Jersey as pt has not been seen since 2021. Stated to pt that we could get everything taken care of for him with his cpap at that appt. Pt will bring cpap with  him to appt. Nothing further needed.

## 2022-03-05 ENCOUNTER — Ambulatory Visit: Payer: PPO | Admitting: Nurse Practitioner

## 2022-03-05 ENCOUNTER — Encounter: Payer: Self-pay | Admitting: Nurse Practitioner

## 2022-03-05 VITALS — BP 126/78 | HR 82 | Temp 98.1°F | Ht 70.0 in | Wt 187.6 lb

## 2022-03-05 DIAGNOSIS — G4733 Obstructive sleep apnea (adult) (pediatric): Secondary | ICD-10-CM

## 2022-03-05 NOTE — Progress Notes (Signed)
$@PatientF$  ID: Randall Reeves, male    DOB: 1945/04/06, 77 y.o.   MRN: [id number]  Chief Complaint  Patient presents with   Follow-up    CPAP making noise.  Unable to use regularly x 6 weeks.  Does not happen every night.  Pt called [facility].  Stated they needed an order for repair or replacement parts.    Referring provider: Venia Carbon, MD  HPI: 77 year old male, never smoker followed for severe OSA on CPAP. He is a patient of Dr. Juanetta Gosling and last seen 10/10/2019 by Groce,NP. Past medical history significant for HTN, CAD, allergic rhinitis, GERD, CKD, HLD.  TEST/EVENTS:  10/15/2004 PSG: RDI 22, SpO2 low 85% 04/05/2017 HST: AHI 66.3, SpO2 low 81%  10/10/2019: OV with Eric Form, NP.  Follow-up for sleep therapy.  Doing well with his CPAP.  He did have a period of 10 days that he did not wear it because his wife was in the hospital.  Otherwise, wears it nightly.  Does sleep in a recliner due to acid reflux.  Going to try to get adjustable bed.  Not having any daytime sleepiness.  Wakes up 1-2 times a night to use the restroom.  Average AHI on download was 6.3.  03/05/2022: Today-follow-up Patient presents today for overdue follow-up.  He was last seen in 2021.  Since then, he has been lost to follow-up.  He has been doing well with his CPAP though.  Feels like he sleeps very well with it.  Does not have any significant daytime fatigue symptoms.  If he does not wear his CPAP, he is very tired the next day and usually naps in his chair.  Denies any drowsy driving or sleep parasomnia/paralysis.  His machine has recently started making a rattling noise.  It is very loud and wakes him and his wife up at times.  He has to put a book under the water chamber to keep it from doing this.  He did have a few nights on his download that he did not wear his CPAP due to the noise.  He was told by [facility] that he would need an office visit to repair the machine.  02/02/2022-03/03/2022: CPAP 7-15 cmH2O 30/30 days; 87%  >4hr; average use 6 hours 7 minutes Pressure 95th blood 0.5 Leaks 95th 2.7 AHI 6  Allergies  Allergen Reactions   Bisoprolol-Hydrochlorothiazide     REACTION: muscle pain, cramps   Codeine Nausea Only   Imipramine Other (See Comments)    Dry mouth, dizziness, headaches, increased sweating    Tamsulosin Other (See Comments)    Dizziness     Immunization History  Administered Date(s) Administered   Fluad Quad(high Dose 65+) 11/09/2018   Influenza Whole 12/16/2011   Influenza, High Dose Seasonal PF 11/10/2014, 11/09/2015   Influenza,inj,Quad PF,6+ Mos 10/30/2012   Influenza-Unspecified 11/08/2013, 11/12/2016, 10/25/2017, 09/26/2019, 11/09/2021   PFIZER(Purple Top)SARS-COV-2 Vaccination 04/01/2019, 04/22/2019, 10/26/2019   Pneumococcal Conjugate-13 01/08/2014   Pneumococcal Polysaccharide-23 10/30/2012   Td 10/01/2005   Tdap 02/18/2016   Zoster Recombinat (Shingrix) 11/27/2018, 04/18/2019   Zoster, Live 10/20/2010    Past Medical History:  Diagnosis Date   Allergy    Arthritis    Cataract    CKD (chronic kidney disease), stage III ([place])    Coronary artery disease, non-occlusive 09/2009   Minimal CAD / no flow-limiting lesions on Cath; CPX February 2022: 1200 exercise, reached 91% image PRN.  Peak VO2 26.5-normal= excellent functional capacity.  High anaerobic threshold suggest  excellent trending effect.  At peak exercise, limited by obesity.:  Normal CPX   GERD (gastroesophageal reflux disease)    Hyperlipidemia    Hypertension    Myocardial infarction - Type 2a MI 2015    Felt to be Type II MI-demand infarct   Neuromuscular disorder (HCC)    Obstructive sleep apnea    Cpap as of 27 days ago   Sleep apnea    wears cpap    Syncope    due to hypotension from BP meds     Tobacco History: Social History   Tobacco Use  Smoking Status Never   Passive exposure: Past  Smokeless Tobacco Never   Counseling given: Not Answered   Outpatient Medications Prior to Visit   Medication Sig Dispense Refill   acetaminophen (TYLENOL) 500 MG tablet Take 1,000 mg by mouth every 8 (eight) hours as needed.     aspirin EC 81 MG tablet Take 81 mg by mouth daily.     losartan (COZAAR) 50 MG tablet TAKE 1 TABLET BY MOUTH ONCE DAILY 90 tablet 3   LUTEIN PO Take 1 tablet by mouth every evening.     Multiple Vitamin (MULTIVITAMIN) tablet Take 1 tablet by mouth daily.     omeprazole (PRILOSEC) 20 MG capsule TAKE 1 CAPSULE BY MOUTH TWICE DAILY (Patient taking differently: 20 mg daily.) 180 capsule 3   OVER THE COUNTER MEDICATION Chlortrimetron allergy relief prn allergies     OVER THE COUNTER MEDICATION Super Beta Prostate, One gel cap daily.     rosuvastatin (CRESTOR) 10 MG tablet TAKE 1 TABLET BY MOUTH ONCE DAILY 90 tablet 1   No facility-administered medications prior to visit.     Review of Systems:   Constitutional: No weight loss or gain, night sweats, fevers, chills, fatigue, or lassitude. HEENT: No headaches, difficulty swallowing, tooth/dental problems, or sore throat. No sneezing, itching, ear ache, nasal congestion, or post nasal drip CV:  No chest pain, orthopnea, PND, swelling in lower extremities, anasarca, dizziness, palpitations, syncope Resp: No shortness of breath with exertion or at rest. No excess mucus or change in color of mucus. No productive or non-productive. No hemoptysis. No wheezing.  No chest wall deformity GI:  +occasional heartburn, indigestion. No abdominal pain, nausea, vomiting, diarrhea, change in bowel habits, loss of appetite GU: No dysuria, change in color of urine, urgency or frequency.   Skin: No rash, lesions, ulcerations MSK:  No joint pain or swelling.  Neuro: No dizziness or lightheadedness.  Psych: No depression or anxiety. Mood stable.     Physical Exam:  BP 126/78 (BP Location: Right Arm, Patient Position: Sitting, Cuff Size: Normal)   Pulse 82   Temp 98.1 F (36.7 C) (Oral)   Ht 5' 10"$  (1.778 m)   Wt 187 lb 9.6 oz  (85.1 kg)   SpO2 98%   BMI 26.92 kg/m   GEN: Pleasant, interactive, well-appearing; in no acute distress. HEENT:  Normocephalic and atraumatic. PERRLA. Sclera white. Nasal turbinates pink, moist and patent bilaterally. No rhinorrhea present. Oropharynx pink and moist, without exudate or edema. No lesions, ulcerations, or postnasal drip. Mallampati III NECK:  Supple w/ fair ROM. No JVD present. Normal carotid impulses w/o bruits. Thyroid symmetrical with no goiter or nodules palpated. No lymphadenopathy.   CV: RRR, no m/r/g, no peripheral edema. Pulses intact, +2 bilaterally. No cyanosis, pallor or clubbing. PULMONARY:  Unlabored, regular breathing. Clear bilaterally A&P w/o wheezes/rales/rhonchi. No accessory muscle use.  GI: BS present and normoactive. Soft, non-tender  to palpation. No organomegaly or masses detected. MSK: No erythema, warmth or tenderness. Cap refil <2 sec all extrem. No deformities or joint swelling noted.  Neuro: A/Ox3. No focal deficits noted.   Skin: Warm, no lesions or rashe Psych: Normal affect and behavior. Judgement and thought content appropriate.     Lab Results:  CBC    Component Value Date/Time   WBC 4.7 05/22/2021 0931   RBC 4.51 05/22/2021 0931   HGB 14.6 05/22/2021 0931   HCT 43.0 05/22/2021 0931   PLT 209.0 05/22/2021 0931   MCV 95.3 05/22/2021 0931   MCH 31.0 04/23/2018 0550   MCHC 33.9 05/22/2021 0931   RDW 13.3 05/22/2021 0931   LYMPHSABS 0.8 04/22/2018 2217   MONOABS 1.2 (H) 04/22/2018 2217   EOSABS 0.0 04/22/2018 2217   BASOSABS 0.1 04/22/2018 2217    BMET    Component Value Date/Time   NA 138 05/22/2021 0931   K 4.2 05/22/2021 0931   CL 102 05/22/2021 0931   CO2 30 05/22/2021 0931   GLUCOSE 96 05/22/2021 0931   BUN 26 (H) 05/22/2021 0931   CREATININE 1.40 05/22/2021 0931   CREATININE 1.38 (H) 01/10/2014 1121   CALCIUM 9.8 05/22/2021 0931   GFRNONAA 54 (L) 04/23/2018 0550   GFRAA >60 04/23/2018 0550    BNP No results found  for: "BNP"   Imaging:  No results found.        No data to display          No results found for: "NITRICOXIDE"      Assessment & Plan:   Obstructive sleep apnea Severe OSA, maintained on CPAP therapy.  He has excellent compliance and continues to receive good benefit from use.  His original AHI is 6, which based on previous download seems to be the normal for him.  He does not have any residual daytime symptoms.  No significant mask leaks.  Currently having issues with machine.  He is due for for a new one as his is 77 years old.  We will send orders for him to take receive new CPAP; continue current settings of 7-15 cmH2O, mask of choice and heated humidification.  Reeducated on proper use and care.  Cautioned on safe driving practices.  Patient Instructions  Continue to use CPAP every night, minimum of 4-6 hours a night.  Change equipment every 30 days or as directed by DME. Wash your tubing with warm soap and water daily, hang to dry. Wash humidifier portion weekly.   Be aware of reduced alertness and do not drive or operate heavy machinery if experiencing this or drowsiness.  Exercise encouraged, as tolerated. Notify if persistent daytime sleepiness occurs even with consistent use of CPAP.  We have sent orders for a new machine. Your medical supply company will reach out to you to set this up  Follow up in 12 weeks to see how new machine is going with Dr. Halford Chessman or Alanson Aly, or sooner, if needed     I spent 28 minutes of dedicated to the care of this patient on the date of this encounter to include pre-visit review of records, face-to-face time with the patient discussing conditions above, post visit ordering of testing, clinical documentation with the electronic health record, making appropriate referrals as documented, and communicating necessary findings to members of the patients care team.  Clayton Bibles, NP 03/05/2022  Pt aware and understands NP's role.

## 2022-03-05 NOTE — Progress Notes (Signed)
Reviewed and agree with assessment/plan.   Chesley Mires, MD [facility] Pulmonary/Critical Care 03/05/2022, 5:20 PM Pager:  [phone]

## 2022-03-05 NOTE — Assessment & Plan Note (Signed)
Severe OSA, maintained on CPAP therapy.  He has excellent compliance and continues to receive good benefit from use.  His original AHI is 6, which based on previous download seems to be the normal for him.  He does not have any residual daytime symptoms.  No significant mask leaks.  Currently having issues with machine.  He is due for for a new one as his is 77 years old.  We will send orders for him to take receive new CPAP; continue current settings of 7-15 cmH2O, mask of choice and heated humidification.  Reeducated on proper use and care.  Cautioned on safe driving practices.  Patient Instructions  Continue to use CPAP every night, minimum of 4-6 hours a night.  Change equipment every 30 days or as directed by DME. Wash your tubing with warm soap and water daily, hang to dry. Wash humidifier portion weekly.   Be aware of reduced alertness and do not drive or operate heavy machinery if experiencing this or drowsiness.  Exercise encouraged, as tolerated. Notify if persistent daytime sleepiness occurs even with consistent use of CPAP.  We have sent orders for a new machine. Your medical supply company will reach out to you to set this up  Follow up in 12 weeks to see how new machine is going with Dr. Halford Chessman or Alanson Aly, or sooner, if needed

## 2022-03-05 NOTE — Patient Instructions (Addendum)
Continue to use CPAP every night, minimum of 4-6 hours a night.  Change equipment every 30 days or as directed by DME. Wash your tubing with warm soap and water daily, hang to dry. Wash humidifier portion weekly.   Be aware of reduced alertness and do not drive or operate heavy machinery if experiencing this or drowsiness.  Exercise encouraged, as tolerated. Notify if persistent daytime sleepiness occurs even with consistent use of CPAP.  We have sent orders for a new machine. Your medical supply company will reach out to you to set this up  Follow up in 12 weeks to see how new machine is going with Dr. Halford Chessman or Alanson Aly, or sooner, if needed

## 2022-03-10 ENCOUNTER — Ambulatory Visit (HOSPITAL_COMMUNITY)
Admission: RE | Admit: 2022-03-10 | Discharge: 2022-03-10 | Disposition: A | Discharge status: Home / Self Care | Payer: PPO | Source: Ambulatory Visit | Attending: Vascular Surgery | Admitting: Vascular Surgery

## 2022-03-10 DIAGNOSIS — I639 Cerebral infarction, unspecified: Secondary | ICD-10-CM | POA: Diagnosis not present

## 2022-03-10 DIAGNOSIS — K5730 Diverticulosis of large intestine without perforation or abscess without bleeding: Secondary | ICD-10-CM | POA: Diagnosis not present

## 2022-03-10 DIAGNOSIS — I700 Atherosclerosis of aorta: Secondary | ICD-10-CM | POA: Insufficient documentation

## 2022-03-10 DIAGNOSIS — N280 Ischemia and infarction of kidney: Secondary | ICD-10-CM | POA: Diagnosis not present

## 2022-03-10 DIAGNOSIS — I722 Aneurysm of renal artery: Secondary | ICD-10-CM | POA: Diagnosis not present

## 2022-03-10 LAB — POCT I-STAT CREATININE: Creatinine, Ser: 1.4 mg/dL — ABNORMAL HIGH (ref 0.61–1.24)

## 2022-03-10 MED ORDER — IOHEXOL 350 MG/ML SOLN
50.0000 mL | Freq: Once | INTRAVENOUS | Status: AC | PRN
  Administered 2022-03-10: 50 mL via INTRAVENOUS

## 2022-03-17 ENCOUNTER — Encounter: Payer: Self-pay | Admitting: Vascular Surgery

## 2022-03-17 ENCOUNTER — Ambulatory Visit (INDEPENDENT_AMBULATORY_CARE_PROVIDER_SITE_OTHER): Payer: PPO | Admitting: Vascular Surgery

## 2022-03-17 VITALS — BP 144/83 | HR 88 | Temp 98.6°F | Resp 20 | Ht 70.0 in | Wt 187.0 lb

## 2022-03-17 DIAGNOSIS — I722 Aneurysm of renal artery: Secondary | ICD-10-CM

## 2022-03-17 DIAGNOSIS — I728 Aneurysm of other specified arteries: Secondary | ICD-10-CM | POA: Diagnosis not present

## 2022-03-17 NOTE — Progress Notes (Signed)
Patient ID: Randall Reeves, male   DOB: [birth year], 77 y.o.   MRN: [id number]  Reason for Consult: Follow-up   Referred by Venia Carbon, MD  Subjective:     HPI:  Randall Reeves is a 77 y.o. male history of hypertension, hyperlipidemia and coronary artery disease.  He was initially evaluated in 2015 for incidental splenic artery aneurysm.  He is subsequently followed in our office 5 years ago and now presents for CT evaluation.  He denies any personal or family history of aneurysm disease.  No history of pancreatitis.  He is a lifelong non-smoker.  Past Medical History:  Diagnosis Date   Allergy    Arthritis    Cataract    CKD (chronic kidney disease), stage III ([place])    Coronary artery disease, non-occlusive 09/2009   Minimal CAD / no flow-limiting lesions on Cath; CPX February 2022: 1200 exercise, reached 91% image PRN.  Peak VO2 26.5-normal= excellent functional capacity.  High anaerobic threshold suggest excellent trending effect.  At peak exercise, limited by obesity.:  Normal CPX   GERD (gastroesophageal reflux disease)    Hyperlipidemia    Hypertension    Myocardial infarction - Type 2a MI 2015    Felt to be Type II MI-demand infarct   Neuromuscular disorder (HCC)    Obstructive sleep apnea    Cpap as of 27 days ago   Sleep apnea    wears cpap    Syncope    due to hypotension from BP meds    Family History  Problem Relation Age of Onset   Stroke Mother    Hypertension Mother    Diabetes Father    Asthma Father    Heart failure Father    Heart disease Father    Hypertension Sister    Diabetes Sister    Neuropathy Brother    Hypertension Brother    Diabetes Brother    Heart disease Brother    Prostate cancer Brother    Hypertension Brother    Colon polyps Brother    Hypertension Brother    Hypertension Brother    Rectal cancer Neg Hx    Stomach cancer Neg Hx    Esophageal cancer Neg Hx    Colon cancer Neg Hx    Past Surgical History:  Procedure Laterality  Date   CARDIOPULMONARY STRESS TEST  03/17/2020   PreEx PFTs: FVC 4.33 (105%), FEV1 3.23 (104%) FEV1/FVC 75 (98% ) MVV 90 (73%): NORMAL; Exercise 12 min;hip and knee fatigue and mild dyspnea.  Rest HR 80 bpm-> max HR 135 bpm (91% of MPHR); BP 124/76 up to 166/74 mmHg; good exercise effort. Peak VO2 26.5 (108% predicted); VE/VCO2 slope 30.  Peak RER 1.1, ventilator threshold 20.6 (84% predicted, 78% measured peak VO2).  Pulse ox remained 94 to 99%   CARPAL TUNNEL RELEASE Left 02/2013   COLONOSCOPY     INGUINAL HERNIA REPAIR Left 1983   with vasectomy   LEFT HEART CATH AND CORONARY ANGIOGRAPHY  10/20/2009   (In the setting of MI with trivial troponin elevation). LM normal --<LAD, LCx. LAD (2 small Diags) - no significant disease, LCx (co-dom with PDA) & 2 OMs (OM2 moderate size with faint "hazy" defect - not flow limiting. Co-dom RCA with minor irregularities.  EF ~60%. ? Mild Inf HK. --> Med Rx.  Felt to be Type II MI-demand infarct   POLYPECTOMY     SPERMATOCELECTOMY  1998   TRANSTHORACIC ECHOCARDIOGRAM  10/2012; 12/2015   a)  EF 55 to 60%.  GR 1 DD.  Normal motion.  Normal valves.;; b) Normal LV size and function.  EF 55 to 60%.  GR 2 DD.  Normal valves.   VASECTOMY  1983   with inguinal hernia repair    Short Social History:  Social History   Tobacco Use   Smoking status: Never    Passive exposure: Past   Smokeless tobacco: Never  Substance Use Topics   Alcohol use: Yes    Alcohol/week: 3.0 - 5.0 standard drinks of alcohol    Types: 3 - 5 Cans of beer per week    Comment:  a few beers in a week.    Allergies  Allergen Reactions   Bisoprolol-Hydrochlorothiazide     REACTION: muscle pain, cramps   Codeine Nausea Only   Imipramine Other (See Comments)    Dry mouth, dizziness, headaches, increased sweating    Tamsulosin Other (See Comments)    Dizziness     Current Outpatient Medications  Medication Sig Dispense Refill   acetaminophen (TYLENOL) 500 MG tablet Take 1,000 mg by  mouth every 8 (eight) hours as needed.     aspirin EC 81 MG tablet Take 81 mg by mouth daily.     losartan (COZAAR) 50 MG tablet TAKE 1 TABLET BY MOUTH ONCE DAILY 90 tablet 3   LUTEIN PO Take 1 tablet by mouth every evening.     Multiple Vitamin (MULTIVITAMIN) tablet Take 1 tablet by mouth daily.     omeprazole (PRILOSEC) 20 MG capsule TAKE 1 CAPSULE BY MOUTH TWICE DAILY (Patient taking differently: 20 mg daily.) 180 capsule 3   OVER THE COUNTER MEDICATION Chlortrimetron allergy relief prn allergies     OVER THE COUNTER MEDICATION Super Beta Prostate, One gel cap daily.     rosuvastatin (CRESTOR) 10 MG tablet TAKE 1 TABLET BY MOUTH ONCE DAILY 90 tablet 1   No current facility-administered medications for this visit.    Review of Systems  Constitutional:  Constitutional negative. HENT: HENT negative.  Eyes: Eyes negative.  Respiratory: Respiratory negative.  GI: Gastrointestinal negative.  Musculoskeletal: Positive for leg pain.  Skin: Skin negative.  Neurological: Neurological negative. Hematologic: Hematologic/lymphatic negative.  Psychiatric: Psychiatric negative.        Objective:  Objective   Vitals:   03/17/22 0859  BP: (!) 144/83  Pulse: 88  Resp: 20  Temp: 98.6 F (37 C)  SpO2: 96%  Weight: 187 lb (84.8 kg)  Height: 5' 10"$  (1.778 m)   Body mass index is 26.83 kg/m.  Physical Exam HENT:     Head: Normocephalic.     Nose: Nose normal.  Eyes:     Pupils: Pupils are equal, round, and reactive to light.  Cardiovascular:     Rate and Rhythm: Normal rate.     Pulses: Normal pulses.  Pulmonary:     Effort: Pulmonary effort is normal.  Abdominal:     General: Abdomen is flat.     Palpations: Abdomen is soft.  Musculoskeletal:        General: Normal range of motion.     Cervical back: Normal range of motion and neck supple.     Right lower leg: No edema.     Left lower leg: No edema.  Skin:    General: Skin is warm.     Capillary Refill: Capillary refill  takes less than 2 seconds.  Neurological:     General: No focal deficit present.     Mental Status:  He is alert.  Psychiatric:        Mood and Affect: Mood normal.        Behavior: Behavior normal.        Thought Content: Thought content normal.        Judgment: Judgment normal.     Data: CT IMPRESSION: VASCULAR   1. Unchanged fusiform, branch point aneurysm about the distal right main renal artery measuring up to approximately 1.2 cm. 2. Unchanged saccular and thrombosed mid splenic artery aneurysm measuring up to approximately 1.0 cm. 3.  Aortic Atherosclerosis (ICD10-I70.0).   NON-VASCULAR   1. No acute abdominopelvic abnormality. 2. 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 3. 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 4. Diverticulosis.   Ruthann Cancer, MD     Assessment/Plan:    77 year old male with small branch right renal artery aneurysm and mostly thrombosed saccular splenic artery aneurysm measuring only 1 cm was incidentally found now about 9 years ago on CT scanning.  Given that these have really not changed I have recommended no further imaging.  If he does have a CT for other reasons that demonstrate changes I be happy to see him in the future.  I did discuss the signs and symptoms of rupture although they would be exceedingly low would require emergent medical attention and he demonstrates good understanding the presence of his wife.  Short of breath can see me on an as-needed basis.     Waynetta Sandy MD [facility]

## 2022-03-23 DIAGNOSIS — M2342 Loose body in knee, left knee: Secondary | ICD-10-CM | POA: Diagnosis not present

## 2022-03-23 DIAGNOSIS — M1712 Unilateral primary osteoarthritis, left knee: Secondary | ICD-10-CM | POA: Diagnosis not present

## 2022-03-23 DIAGNOSIS — M62838 Other muscle spasm: Secondary | ICD-10-CM | POA: Diagnosis not present

## 2022-03-23 DIAGNOSIS — M79662 Pain in left lower leg: Secondary | ICD-10-CM | POA: Diagnosis not present

## 2022-04-05 DIAGNOSIS — H40003 Preglaucoma, unspecified, bilateral: Secondary | ICD-10-CM | POA: Diagnosis not present

## 2022-04-08 ENCOUNTER — Other Ambulatory Visit: Payer: Self-pay | Admitting: Internal Medicine

## 2022-04-13 DIAGNOSIS — H2513 Age-related nuclear cataract, bilateral: Secondary | ICD-10-CM | POA: Diagnosis not present

## 2022-04-13 DIAGNOSIS — H40003 Preglaucoma, unspecified, bilateral: Secondary | ICD-10-CM | POA: Diagnosis not present

## 2022-04-15 ENCOUNTER — Telehealth: Payer: Self-pay | Admitting: Nurse Practitioner

## 2022-04-15 DIAGNOSIS — G4733 Obstructive sleep apnea (adult) (pediatric): Secondary | ICD-10-CM

## 2022-04-15 NOTE — Telephone Encounter (Signed)
Called and spoke to patient and he states that he is needing cpap supplies for his current cpap. He states he is not able to get a new machine until [facility] 2nd. But he will call us with an update if the new supplies fixes his machine currently. Nothing further needed

## 2022-04-22 DIAGNOSIS — G4733 Obstructive sleep apnea (adult) (pediatric): Secondary | ICD-10-CM | POA: Diagnosis not present

## 2022-05-26 ENCOUNTER — Encounter: Payer: PPO | Admitting: Internal Medicine

## 2022-05-27 ENCOUNTER — Encounter: Payer: Self-pay | Admitting: Internal Medicine

## 2022-05-27 ENCOUNTER — Ambulatory Visit (INDEPENDENT_AMBULATORY_CARE_PROVIDER_SITE_OTHER): Payer: PPO | Admitting: Internal Medicine

## 2022-05-27 VITALS — BP 130/80 | HR 80 | Temp 97.2°F | Ht 68.0 in | Wt 189.0 lb

## 2022-05-27 DIAGNOSIS — N138 Other obstructive and reflux uropathy: Secondary | ICD-10-CM

## 2022-05-27 DIAGNOSIS — Z125 Encounter for screening for malignant neoplasm of prostate: Secondary | ICD-10-CM | POA: Diagnosis not present

## 2022-05-27 DIAGNOSIS — I700 Atherosclerosis of aorta: Secondary | ICD-10-CM | POA: Diagnosis not present

## 2022-05-27 DIAGNOSIS — N1831 Chronic kidney disease, stage 3a: Secondary | ICD-10-CM

## 2022-05-27 DIAGNOSIS — G629 Polyneuropathy, unspecified: Secondary | ICD-10-CM

## 2022-05-27 DIAGNOSIS — N401 Enlarged prostate with lower urinary tract symptoms: Secondary | ICD-10-CM | POA: Diagnosis not present

## 2022-05-27 DIAGNOSIS — I10 Essential (primary) hypertension: Secondary | ICD-10-CM | POA: Diagnosis not present

## 2022-05-27 DIAGNOSIS — Z0000 Encounter for general adult medical examination without abnormal findings: Secondary | ICD-10-CM | POA: Diagnosis not present

## 2022-05-27 DIAGNOSIS — G4733 Obstructive sleep apnea (adult) (pediatric): Secondary | ICD-10-CM | POA: Diagnosis not present

## 2022-05-27 LAB — RENAL FUNCTION PANEL
Albumin: 4.3 g/dL (ref 3.5–5.2)
BUN: 26 mg/dL — ABNORMAL HIGH (ref 6–23)
CO2: 29 mEq/L (ref 19–32)
Calcium: 9.5 mg/dL (ref 8.4–10.5)
Chloride: 103 mEq/L (ref 96–112)
Creatinine, Ser: 1.41 mg/dL (ref 0.40–1.50)
GFR: 49.05 mL/min — ABNORMAL LOW (ref >60.00)
Glucose, Bld: 84 mg/dL (ref 70–99)
Phosphorus: 3 mg/dL (ref 2.3–4.6)
Potassium: 4.3 mEq/L (ref 3.5–5.1)
Sodium: 139 mEq/L (ref 135–145)

## 2022-05-27 LAB — LIPID PANEL
Cholesterol: 145 mg/dL (ref 0–200)
HDL: 51.7 mg/dL (ref >39.00)
NonHDL: 93.61
Total CHOL/HDL Ratio: 3
Triglycerides: 217 mg/dL — ABNORMAL HIGH (ref 0.0–149.0)
VLDL: 43.4 mg/dL — ABNORMAL HIGH (ref 0.0–40.0)

## 2022-05-27 LAB — HEPATIC FUNCTION PANEL
ALT: 17 U/L (ref 0–53)
AST: 17 U/L (ref 0–37)
Albumin: 4.3 g/dL (ref 3.5–5.2)
Alkaline Phosphatase: 79 U/L (ref 39–117)
Bilirubin, Direct: 0.1 mg/dL (ref 0.0–0.3)
Total Bilirubin: 0.6 mg/dL (ref 0.2–1.2)
Total Protein: 7.3 g/dL (ref 6.0–8.3)

## 2022-05-27 LAB — CBC
HCT: 43.3 % (ref 39.0–52.0)
Hemoglobin: 14.7 g/dL (ref 13.0–17.0)
MCHC: 33.9 g/dL (ref 30.0–36.0)
MCV: 95.9 fl (ref 78.0–100.0)
Platelets: 236 10*3/uL (ref 150.0–400.0)
RBC: 4.51 Mil/uL (ref 4.22–5.81)
RDW: 13.2 % (ref 11.5–15.5)
WBC: 5.3 10*3/uL (ref 4.0–10.5)

## 2022-05-27 LAB — HEMOGLOBIN A1C: Hgb A1c MFr Bld: 5.9 % (ref 4.6–6.5)

## 2022-05-27 LAB — PSA, MEDICARE: PSA: 0.5 ng/ml (ref 0.10–4.00)

## 2022-05-27 LAB — TSH: TSH: 3.48 u[IU]/mL (ref 0.35–5.50)

## 2022-05-27 LAB — VITAMIN B12: Vitamin B-12: 612 pg/mL (ref 211–911)

## 2022-05-27 LAB — VITAMIN D 25 HYDROXY (VIT D DEFICIENCY, FRACTURES): VITD: 61.74 ng/mL (ref 30.00–100.00)

## 2022-05-27 LAB — LDL CHOLESTEROL, DIRECT: Direct LDL: 68 mg/dL

## 2022-05-27 MED ORDER — OMEPRAZOLE 20 MG PO CPDR: 20.0000 mg | DELAYED_RELEASE_CAPSULE | Freq: Every day | ORAL | 0 refills | Status: DC

## 2022-05-27 MED ORDER — POLYMYXIN B-TRIMETHOPRIM 10000-0.1 UNIT/ML-% OP SOLN: 1.0000 [drp] | Freq: Four times a day (QID) | OPHTHALMIC | 0 refills | Status: DC

## 2022-05-27 MED ORDER — TAMSULOSIN HCL 0.4 MG PO CAPS: 0.4000 mg | ORAL_CAPSULE | Freq: Every day | ORAL | 3 refills | Status: DC

## 2022-05-27 NOTE — Progress Notes (Signed)
Hearing Screening - Comments:: Passed whisper test Vision Screening - Comments:: March 2024  

## 2022-05-27 NOTE — Assessment & Plan Note (Signed)
Ready to try med---Rx for tamsulosin

## 2022-05-27 NOTE — Assessment & Plan Note (Signed)
On CPAP---going back to review with Dr Craige Cotta

## 2022-05-27 NOTE — Progress Notes (Signed)
Subjective:    Patient ID: Randall Reeves, male    DOB: 06-Sep-1945, 77 y.o.   MRN: [id number]  HPI Here for Medicare wellness visit and follow up of chronic health conditions Reviewed advanced directives Reviewed other doctors---[facility], Dr Ty Hilts, Dr Andrena Mews, Dr Maisie Fus, Dr Zada Girt No hospitalizations or surgery in the past year Vision is fair-- 6 month follow ups for pressure and cataracts  Hearing is fair--wife notes a problem Rare alcohol No tobacco Not regular exercise--discussed Did fall once---tripped on a vine and thinks he broke ribs. Didn't get evaluated No depression or anhedonia Independent with instrumental ADLs Some memory issues---no functional issues  Has stye on left lower lid Some better now Using hot compresses---has antibiotic drops also (but out of them)  Neuropathy has worsened Did see neurologist in the past---told "no neuropathy" Notes more trouble with balance---but doesn't feel he is ready for walking stick Has numbness--slight pain at times  GFR fairly stable at 49  Did have vascular evaluation---CT angio Aneurysms are stable--no follow up recommended Known aortic atherosclerosis  Voids okay Some frequency Tried super beta prostate---not clearly helpful Interested in Rx  Heartburn is controlled with daily omeprazole No dysphagia  Current Outpatient Medications on File Prior to Visit  Medication Sig Dispense Refill   acetaminophen (TYLENOL) 500 MG tablet Take 1,000 mg by mouth every 8 (eight) hours as needed.     aspirin EC 81 MG tablet Take 81 mg by mouth daily.     losartan (COZAAR) 50 MG tablet TAKE ONE TABLET BY MOUTH ONCE DAILY 90 tablet 0   LUTEIN PO Take 1 tablet by mouth every evening.     Multiple Vitamin (MULTIVITAMIN) tablet Take 1 tablet by mouth daily.     OVER THE COUNTER MEDICATION Chlortrimetron allergy relief prn allergies     rosuvastatin (CRESTOR) 10 MG tablet TAKE ONE TABLET BY MOUTH ONCE DAILY 90  tablet 0   No current facility-administered medications on file prior to visit.    Allergies  Allergen Reactions   Bisoprolol-Hydrochlorothiazide     REACTION: muscle pain, cramps   Codeine Nausea Only   Imipramine Other (See Comments)    Dry mouth, dizziness, headaches, increased sweating    Tamsulosin Other (See Comments)    Dizziness     Past Medical History:  Diagnosis Date   Allergy    Arthritis    Cataract    CKD (chronic kidney disease), stage III (HCC)    Coronary artery disease, non-occlusive 09/2009   Minimal CAD / no flow-limiting lesions on Cath; CPX February 2022: 1200 exercise, reached 91% image PRN.  Peak VO2 26.5-normal= excellent functional capacity.  High anaerobic threshold suggest excellent trending effect.  At peak exercise, limited by obesity.:  Normal CPX   GERD (gastroesophageal reflux disease)    Hyperlipidemia    Hypertension    Myocardial infarction - Type 2a MI 2015    Felt to be Type II MI-demand infarct   Neuromuscular disorder (HCC)    Obstructive sleep apnea    Cpap as of 27 days ago   Sleep apnea    wears cpap    Syncope    due to hypotension from BP meds     Past Surgical History:  Procedure Laterality Date   CARDIOPULMONARY STRESS TEST  03/17/2020   PreEx PFTs: FVC 4.33 (105%), FEV1 3.23 (104%) FEV1/FVC 75 (98% ) MVV 90 (73%): NORMAL; Exercise 12 min;hip and knee fatigue and mild dyspnea.  Rest HR 80 bpm-> max HR  135 bpm (91% of MPHR); BP 124/76 up to 166/74 mmHg; good exercise effort. Peak VO2 26.5 (108% predicted); VE/VCO2 slope 30.  Peak RER 1.1, ventilator threshold 20.6 (84% predicted, 78% measured peak VO2).  Pulse ox remained 94 to 99%   CARPAL TUNNEL RELEASE Left 02/2013   COLONOSCOPY     INGUINAL HERNIA REPAIR Left 1983   with vasectomy   LEFT HEART CATH AND CORONARY ANGIOGRAPHY  10/20/2009   (In the setting of MI with trivial troponin elevation). LM normal --<LAD, LCx. LAD (2 small Diags) - no significant disease, LCx  (co-dom with PDA) & 2 OMs (OM2 moderate size with faint "hazy" defect - not flow limiting. Co-dom RCA with minor irregularities.  EF ~60%. ? Mild Inf HK. --> Med Rx.  Felt to be Type II MI-demand infarct   POLYPECTOMY     SPERMATOCELECTOMY  1998   TRANSTHORACIC ECHOCARDIOGRAM  10/2012; 12/2015   a) EF 55 to 60%.  GR 1 DD.  Normal motion.  Normal valves.;; b) Normal LV size and function.  EF 55 to 60%.  GR 2 DD.  Normal valves.   VASECTOMY  1983   with inguinal hernia repair    Family History  Problem Relation Age of Onset   Stroke Mother    Hypertension Mother    Diabetes Father    Asthma Father    Heart failure Father    Heart disease Father    Hypertension Sister    Diabetes Sister    Neuropathy Brother    Hypertension Brother    Diabetes Brother    Heart disease Brother    Prostate cancer Brother    Hypertension Brother    Colon polyps Brother    Hypertension Brother    Hypertension Brother    Rectal cancer Neg Hx    Stomach cancer Neg Hx    Esophageal cancer Neg Hx    Colon cancer Neg Hx     Social History   Socioeconomic History   Marital status: Married    Spouse name: Not on file   Number of children: 2   Years of education: Not on file   Highest education level: Not on file  Occupational History   Occupation: Aeronautical engineer  Tobacco Use   Smoking status: Never    Passive exposure: Past   Smokeless tobacco: Never  Vaping Use   Vaping Use: Never used  Substance and Sexual Activity   Alcohol use: Yes    Alcohol/week: 3.0 - 5.0 standard drinks of alcohol    Types: 3 - 5 Cans of beer per week    Comment:  a few beers in a week.   Drug use: No   Sexual activity: Not on file  Other Topics Concern   Not on file  Social History Narrative   Has living will   Wife is his health care POA--alternate is son   Would accept resuscitation attempts.   Would leave feeding tube decision to his wife   Social Determinants of Health   Financial Resource Strain: Not on  file  Food Insecurity: Not on file  Transportation Needs: Not on file  Physical Activity: Not on file  Stress: Not on file  Social Connections: Not on file  Intimate Partner Violence: Not on file   Review of Systems Appetite is good Weight is back up some--after significant weight loss Sleeps fairly well--does use the CPAP. Having some trouble with the machine Teeth okay---sees dentist Wears seat belt Bowels move fine---no blood  Some aches and pains--especially knees. Will take tylenol prn No suspicious skin lesions     Objective:   Physical Exam Constitutional:      Appearance: Normal appearance.  HENT:     Mouth/Throat:     Pharynx: No oropharyngeal exudate or posterior oropharyngeal erythema.     Comments: Small hordeolum lower left lid Eyes:     Conjunctiva/sclera: Conjunctivae normal.     Pupils: Pupils are equal, round, and reactive to light.  Cardiovascular:     Rate and Rhythm: Normal rate and regular rhythm.     Pulses: Normal pulses.     Heart sounds:     No gallop.  Pulmonary:     Effort: Pulmonary effort is normal.     Breath sounds: Normal breath sounds. No wheezing or rales.  Abdominal:     Palpations: Abdomen is soft.     Tenderness: There is no abdominal tenderness.  Musculoskeletal:     Cervical back: Neck supple.     Right lower leg: No edema.     Left lower leg: No edema.  Lymphadenopathy:     Cervical: No cervical adenopathy.  Skin:    Findings: No lesion or rash.  Neurological:     General: No focal deficit present.     Mental Status: He is alert and oriented to person, place, and time.     Comments: Word naming---8/1 minute Recall 3/3  Psychiatric:        Mood and Affect: Mood normal.        Behavior: Behavior normal.            Assessment & Plan:

## 2022-05-27 NOTE — Assessment & Plan Note (Signed)
On imaging Is on the rosuvastatin

## 2022-05-27 NOTE — Assessment & Plan Note (Signed)
Fairly stable on the losartan

## 2022-05-27 NOTE — Assessment & Plan Note (Signed)
Mild balance issues but rare pain No Rx

## 2022-05-27 NOTE — Assessment & Plan Note (Signed)
BP Readings from Last 3 Encounters:  05/27/22 130/80  03/17/22 (!) 144/83  03/05/22 126/78   Fine on the losartan 50 Will check labs

## 2022-05-27 NOTE — Assessment & Plan Note (Signed)
I have personally reviewed the Medicare Annual Wellness questionnaire and have noted 1. The patient's medical and social history 2. Their use of alcohol, tobacco or illicit drugs 3. Their current medications and supplements 4. The patient's functional ability including ADL's, fall risks, home safety risks and hearing or visual             impairment. 5. Diet and physical activities 6. Evidence for depression or mood disorders  The patients weight, height, BMI and visual acuity have been recorded in the chart I have made referrals, counseling and provided education to the patient based review of the above and I have provided the pt with a written personalized care plan for preventive services.  I have provided you with a copy of your personalized plan for preventive services. Please take the time to review along with your updated medication list.  Discussed PSA---will check one last time Consider last screening colon in 2028 He vows to increase exercise FLu/RSV in the fall Due for updated COVID vaccine

## 2022-05-28 LAB — PARATHYROID HORMONE, INTACT (NO CA): PTH: 30 pg/mL (ref 16–77)

## 2022-06-07 ENCOUNTER — Ambulatory Visit (HOSPITAL_BASED_OUTPATIENT_CLINIC_OR_DEPARTMENT_OTHER): Payer: PPO | Admitting: Pulmonary Disease

## 2022-06-07 DIAGNOSIS — H0015 Chalazion left lower eyelid: Secondary | ICD-10-CM | POA: Diagnosis not present

## 2022-06-25 ENCOUNTER — Telehealth: Payer: Self-pay | Admitting: Nurse Practitioner

## 2022-06-25 DIAGNOSIS — G4733 Obstructive sleep apnea (adult) (pediatric): Secondary | ICD-10-CM

## 2022-06-25 NOTE — Telephone Encounter (Signed)
Randall Reeves, can we get this pt an appointment before July ?

## 2022-06-25 NOTE — Telephone Encounter (Signed)
In Feb at his appt Ms. Cobb agreed he needed a new CPAP and ordered it for him. They found out from [employer] he would not qual for one until 4/9. He wanted to make an appt but I said we should be able to resubmit the order for a new one now now that he qualifies. Please call PT to advise @ [phone]

## 2022-06-28 NOTE — Telephone Encounter (Signed)
Called Brad New with [employer].  Per Nida Boatman, he verified patients file.  It is okay to use the office visit with Randall Reeves on 03/05/2022 as documentation to submit an order for patient to renew order to get another new CPAP.  This visit was a compliance check and to place an order to renew his CPAP machine. Brad asked that Katie send the order for the CPAP along with the office notes to [employer] and he will get this taken care of.  Katie, please advise.

## 2022-06-28 NOTE — Telephone Encounter (Signed)
Sent in order for renewal of CPAP and all supplies to [employer] per [place].

## 2022-06-28 NOTE — Telephone Encounter (Signed)
Please find out if the DME company will accept the note from February. If so, we can just send a new order and then see him for follow up 8-10 weeks after he gets his new CPAP. Thanks.

## 2022-07-15 ENCOUNTER — Telehealth: Payer: Self-pay

## 2022-07-15 NOTE — Patient Outreach (Signed)
  Care Coordination   Initial Visit Note   07/15/2022 Name: Randall Reeves MRN: [id number] DOB: [birth year]  Randall Flock Kevorkian is a 77 y.o. year old male who sees [name], MD for primary care. I spoke with  Randall Reeves by phone today.  What matters to the patients health and wellness today?  Patient denies having any nursing or community resource needs.     Goals Addressed             This Visit's Progress    COMPLETED: care coordination activities - no follow up needed       Interventions Today    Flowsheet Row Most Recent Value  General Interventions   General Interventions Discussed/Reviewed General Interventions Discussed  [Care coordination services discussed. SDOH survey completed. AWV discussed and patient advised to contact provider office to scheduled.  Encouraged to get vaccinations. Advised to contact PCP office if care coordination services needed in the future.]              SDOH assessments and interventions completed:  Yes  SDOH Interventions Today    Flowsheet Row Most Recent Value  SDOH Interventions   Food Insecurity Interventions Intervention Not Indicated  Housing Interventions Intervention Not Indicated  Transportation Interventions Intervention Not Indicated        Care Coordination Interventions:  Yes, provided   Follow up plan: No further intervention required.   Encounter Outcome:  Pt. Visit Completed   George Ina RN,BSN,CCM [facility] Care Coordination [phone] direct line

## 2022-07-29 ENCOUNTER — Inpatient Hospital Stay (HOSPITAL_COMMUNITY)
Admission: EM | Admit: 2022-07-29 | Discharge: 2022-08-03 | DRG: 481 | Disposition: A | Discharge status: Skilled Nursing Facility | Payer: PPO | Attending: Internal Medicine | Admitting: Internal Medicine

## 2022-07-29 ENCOUNTER — Emergency Department (HOSPITAL_COMMUNITY): Payer: PPO

## 2022-07-29 ENCOUNTER — Observation Stay (HOSPITAL_COMMUNITY): Payer: PPO | Admitting: Anesthesiology

## 2022-07-29 ENCOUNTER — Observation Stay (HOSPITAL_BASED_OUTPATIENT_CLINIC_OR_DEPARTMENT_OTHER): Payer: PPO | Admitting: Anesthesiology

## 2022-07-29 ENCOUNTER — Other Ambulatory Visit: Payer: Self-pay

## 2022-07-29 ENCOUNTER — Encounter (HOSPITAL_COMMUNITY)
Admission: EM | Disposition: A | Discharge status: Skilled Nursing Facility | Payer: Self-pay | Source: Home / Self Care | Attending: Internal Medicine

## 2022-07-29 ENCOUNTER — Observation Stay (HOSPITAL_COMMUNITY): Payer: PPO

## 2022-07-29 DIAGNOSIS — N401 Enlarged prostate with lower urinary tract symptoms: Secondary | ICD-10-CM

## 2022-07-29 DIAGNOSIS — S72012A Unspecified intracapsular fracture of left femur, initial encounter for closed fracture: Secondary | ICD-10-CM | POA: Diagnosis present

## 2022-07-29 DIAGNOSIS — Z823 Family history of stroke: Secondary | ICD-10-CM

## 2022-07-29 DIAGNOSIS — N400 Enlarged prostate without lower urinary tract symptoms: Secondary | ICD-10-CM | POA: Diagnosis present

## 2022-07-29 DIAGNOSIS — Z8042 Family history of malignant neoplasm of prostate: Secondary | ICD-10-CM

## 2022-07-29 DIAGNOSIS — D72829 Elevated white blood cell count, unspecified: Secondary | ICD-10-CM | POA: Diagnosis present

## 2022-07-29 DIAGNOSIS — I129 Hypertensive chronic kidney disease with stage 1 through stage 4 chronic kidney disease, or unspecified chronic kidney disease: Secondary | ICD-10-CM | POA: Diagnosis present

## 2022-07-29 DIAGNOSIS — S79912A Unspecified injury of left hip, initial encounter: Secondary | ICD-10-CM | POA: Diagnosis not present

## 2022-07-29 DIAGNOSIS — E785 Hyperlipidemia, unspecified: Secondary | ICD-10-CM | POA: Diagnosis not present

## 2022-07-29 DIAGNOSIS — W11XXXA Fall on and from ladder, initial encounter: Secondary | ICD-10-CM | POA: Diagnosis present

## 2022-07-29 DIAGNOSIS — S72102A Unspecified trochanteric fracture of left femur, initial encounter for closed fracture: Secondary | ICD-10-CM | POA: Diagnosis not present

## 2022-07-29 DIAGNOSIS — G4733 Obstructive sleep apnea (adult) (pediatric): Secondary | ICD-10-CM | POA: Diagnosis not present

## 2022-07-29 DIAGNOSIS — S79002A Unspecified physeal fracture of upper end of left femur, initial encounter for closed fracture: Secondary | ICD-10-CM | POA: Diagnosis not present

## 2022-07-29 DIAGNOSIS — M1612 Unilateral primary osteoarthritis, left hip: Secondary | ICD-10-CM | POA: Diagnosis not present

## 2022-07-29 DIAGNOSIS — N138 Other obstructive and reflux uropathy: Secondary | ICD-10-CM | POA: Diagnosis present

## 2022-07-29 DIAGNOSIS — S72002A Fracture of unspecified part of neck of left femur, initial encounter for closed fracture: Secondary | ICD-10-CM

## 2022-07-29 DIAGNOSIS — K219 Gastro-esophageal reflux disease without esophagitis: Secondary | ICD-10-CM | POA: Diagnosis present

## 2022-07-29 DIAGNOSIS — W19XXXA Unspecified fall, initial encounter: Principal | ICD-10-CM

## 2022-07-29 DIAGNOSIS — I10 Essential (primary) hypertension: Secondary | ICD-10-CM | POA: Diagnosis present

## 2022-07-29 DIAGNOSIS — Y92009 Unspecified place in unspecified non-institutional (private) residence as the place of occurrence of the external cause: Secondary | ICD-10-CM

## 2022-07-29 DIAGNOSIS — D62 Acute posthemorrhagic anemia: Secondary | ICD-10-CM | POA: Diagnosis not present

## 2022-07-29 DIAGNOSIS — I4892 Unspecified atrial flutter: Secondary | ICD-10-CM | POA: Diagnosis not present

## 2022-07-29 DIAGNOSIS — I2510 Atherosclerotic heart disease of native coronary artery without angina pectoris: Secondary | ICD-10-CM | POA: Diagnosis present

## 2022-07-29 DIAGNOSIS — Z825 Family history of asthma and other chronic lower respiratory diseases: Secondary | ICD-10-CM

## 2022-07-29 DIAGNOSIS — Z79899 Other long term (current) drug therapy: Secondary | ICD-10-CM

## 2022-07-29 DIAGNOSIS — K5900 Constipation, unspecified: Secondary | ICD-10-CM | POA: Diagnosis present

## 2022-07-29 DIAGNOSIS — S72142A Displaced intertrochanteric fracture of left femur, initial encounter for closed fracture: Secondary | ICD-10-CM | POA: Diagnosis not present

## 2022-07-29 DIAGNOSIS — I4589 Other specified conduction disorders: Secondary | ICD-10-CM | POA: Diagnosis not present

## 2022-07-29 DIAGNOSIS — Z8249 Family history of ischemic heart disease and other diseases of the circulatory system: Secondary | ICD-10-CM

## 2022-07-29 DIAGNOSIS — Z885 Allergy status to narcotic agent status: Secondary | ICD-10-CM

## 2022-07-29 DIAGNOSIS — Z7982 Long term (current) use of aspirin: Secondary | ICD-10-CM

## 2022-07-29 DIAGNOSIS — R000 Tachycardia, unspecified: Secondary | ICD-10-CM | POA: Diagnosis not present

## 2022-07-29 DIAGNOSIS — I480 Paroxysmal atrial fibrillation: Secondary | ICD-10-CM | POA: Diagnosis not present

## 2022-07-29 DIAGNOSIS — I252 Old myocardial infarction: Secondary | ICD-10-CM

## 2022-07-29 DIAGNOSIS — I951 Orthostatic hypotension: Secondary | ICD-10-CM | POA: Diagnosis not present

## 2022-07-29 DIAGNOSIS — N1831 Chronic kidney disease, stage 3a: Secondary | ICD-10-CM | POA: Diagnosis not present

## 2022-07-29 DIAGNOSIS — S7222XA Displaced subtrochanteric fracture of left femur, initial encounter for closed fracture: Secondary | ICD-10-CM | POA: Diagnosis not present

## 2022-07-29 DIAGNOSIS — G629 Polyneuropathy, unspecified: Secondary | ICD-10-CM | POA: Diagnosis present

## 2022-07-29 DIAGNOSIS — Z888 Allergy status to other drugs, medicaments and biological substances status: Secondary | ICD-10-CM

## 2022-07-29 DIAGNOSIS — S72009A Fracture of unspecified part of neck of unspecified femur, initial encounter for closed fracture: Secondary | ICD-10-CM | POA: Diagnosis present

## 2022-07-29 DIAGNOSIS — Z043 Encounter for examination and observation following other accident: Secondary | ICD-10-CM | POA: Diagnosis not present

## 2022-07-29 DIAGNOSIS — Z833 Family history of diabetes mellitus: Secondary | ICD-10-CM

## 2022-07-29 DIAGNOSIS — S7292XD Unspecified fracture of left femur, subsequent encounter for closed fracture with routine healing: Secondary | ICD-10-CM | POA: Diagnosis not present

## 2022-07-29 DIAGNOSIS — Z83719 Family history of colon polyps, unspecified: Secondary | ICD-10-CM

## 2022-07-29 HISTORY — PX: INTRAMEDULLARY (IM) NAIL INTERTROCHANTERIC: SHX5875

## 2022-07-29 LAB — I-STAT CHEM 8, ED
BUN: 27 mg/dL — ABNORMAL HIGH (ref 8–23)
Calcium, Ion: 1 mmol/L — ABNORMAL LOW (ref 1.15–1.40)
Chloride: 109 mmol/L (ref 98–111)
Creatinine, Ser: 1.5 mg/dL — ABNORMAL HIGH (ref 0.61–1.24)
Glucose, Bld: 140 mg/dL — ABNORMAL HIGH (ref 70–99)
HCT: 40 % (ref 39.0–52.0)
Hemoglobin: 13.6 g/dL (ref 13.0–17.0)
Potassium: 4.5 mmol/L (ref 3.5–5.1)
Sodium: 138 mmol/L (ref 135–145)
TCO2: 23 mmol/L (ref 22–32)

## 2022-07-29 LAB — CBC
HCT: 40.2 % (ref 39.0–52.0)
HCT: 36.4 % — ABNORMAL LOW (ref 39.0–52.0)
Hemoglobin: 13.4 g/dL (ref 13.0–17.0)
Hemoglobin: 12.1 g/dL — ABNORMAL LOW (ref 13.0–17.0)
MCH: 31.7 pg (ref 26.0–34.0)
MCH: 32.5 pg (ref 26.0–34.0)
MCHC: 33.3 g/dL (ref 30.0–36.0)
MCHC: 33.2 g/dL (ref 30.0–36.0)
MCV: 95 fL (ref 80.0–100.0)
MCV: 97.8 fL (ref 80.0–100.0)
Platelets: 194 10*3/uL (ref 150–400)
Platelets: 187 10*3/uL (ref 150–400)
RBC: 4.23 MIL/uL (ref 4.22–5.81)
RBC: 3.72 MIL/uL — ABNORMAL LOW (ref 4.22–5.81)
RDW: 12.3 % (ref 11.5–15.5)
RDW: 12.4 % (ref 11.5–15.5)
WBC: 7.4 10*3/uL (ref 4.0–10.5)
WBC: 7.1 10*3/uL (ref 4.0–10.5)
nRBC: 0 % (ref 0.0–0.2)
nRBC: 0 % (ref 0.0–0.2)

## 2022-07-29 LAB — COMPREHENSIVE METABOLIC PANEL
ALT: 25 U/L (ref 0–44)
AST: 32 U/L (ref 15–41)
Albumin: 3.8 g/dL (ref 3.5–5.0)
Alkaline Phosphatase: 64 U/L (ref 38–126)
Anion gap: 8 (ref 5–15)
BUN: 23 mg/dL (ref 8–23)
CO2: 23 mmol/L (ref 22–32)
Calcium: 8.6 mg/dL — ABNORMAL LOW (ref 8.9–10.3)
Chloride: 106 mmol/L (ref 98–111)
Creatinine, Ser: 1.49 mg/dL — ABNORMAL HIGH (ref 0.61–1.24)
GFR, Estimated: 49 mL/min — ABNORMAL LOW (ref >60)
Glucose, Bld: 140 mg/dL — ABNORMAL HIGH (ref 70–99)
Potassium: 4.4 mmol/L (ref 3.5–5.1)
Sodium: 137 mmol/L (ref 135–145)
Total Bilirubin: 0.6 mg/dL (ref 0.3–1.2)
Total Protein: 6.7 g/dL (ref 6.5–8.1)

## 2022-07-29 LAB — SAMPLE TO BLOOD BANK

## 2022-07-29 LAB — ETHANOL: Alcohol, Ethyl (B): <10 mg/dL (ref <10)

## 2022-07-29 LAB — PROTIME-INR
INR: 1 (ref 0.8–1.2)
Prothrombin Time: 13.3 seconds (ref 11.4–15.2)

## 2022-07-29 LAB — LACTIC ACID, PLASMA: Lactic Acid, Venous: 1.3 mmol/L (ref 0.5–1.9)

## 2022-07-29 LAB — CREATININE, SERUM
Creatinine, Ser: 1.32 mg/dL — ABNORMAL HIGH (ref 0.61–1.24)
GFR, Estimated: 57 mL/min — ABNORMAL LOW (ref >60)

## 2022-07-29 SURGERY — FIXATION, FRACTURE, INTERTROCHANTERIC, WITH INTRAMEDULLARY ROD
Anesthesia: General | Site: Leg Upper | Laterality: Left

## 2022-07-29 MED ORDER — TRANEXAMIC ACID-NACL 1000-0.7 MG/100ML-% IV SOLN
1000.0000 mg | Freq: Once | INTRAVENOUS | Status: AC
  Administered 2022-07-29: 1000 mg via INTRAVENOUS
  Filled 2022-07-29: qty 100

## 2022-07-29 MED ORDER — DEXAMETHASONE SODIUM PHOSPHATE 10 MG/ML IJ SOLN
INTRAMUSCULAR | Status: DC | PRN
  Administered 2022-07-29: 10 mg via INTRAVENOUS

## 2022-07-29 MED ORDER — POLYETHYLENE GLYCOL 3350 17 G PO PACK: 17.0000 g | PACK | Freq: Every day | ORAL | Status: DC | PRN

## 2022-07-29 MED ORDER — METOCLOPRAMIDE HCL 5 MG PO TABS: 5.0000 mg | ORAL_TABLET | Freq: Three times a day (TID) | ORAL | Status: DC | PRN

## 2022-07-29 MED ORDER — LOSARTAN POTASSIUM 50 MG PO TABS
50.0000 mg | ORAL_TABLET | Freq: Every day | ORAL | Status: DC
  Filled 2022-07-29: qty 1

## 2022-07-29 MED ORDER — HYDROMORPHONE HCL 1 MG/ML IJ SOLN
1.0000 mg | Freq: Once | INTRAMUSCULAR | Status: AC
  Administered 2022-07-29: 1 mg via INTRAVENOUS
  Filled 2022-07-29: qty 1

## 2022-07-29 MED ORDER — ROCURONIUM BROMIDE 10 MG/ML (PF) SYRINGE
PREFILLED_SYRINGE | INTRAVENOUS | Status: DC | PRN
  Administered 2022-07-29: 50 mg via INTRAVENOUS

## 2022-07-29 MED ORDER — ONDANSETRON HCL 4 MG/2ML IJ SOLN
INTRAMUSCULAR | Status: AC
  Filled 2022-07-29: qty 2

## 2022-07-29 MED ORDER — METOCLOPRAMIDE HCL 5 MG/ML IJ SOLN: 5.0000 mg | Freq: Three times a day (TID) | INTRAMUSCULAR | Status: DC | PRN

## 2022-07-29 MED ORDER — MENTHOL 3 MG MT LOZG: 1.0000 | LOZENGE | OROMUCOSAL | Status: DC | PRN

## 2022-07-29 MED ORDER — EPHEDRINE SULFATE-NACL 50-0.9 MG/10ML-% IV SOSY
PREFILLED_SYRINGE | INTRAVENOUS | Status: DC | PRN
  Administered 2022-07-29: 10 mg via INTRAVENOUS

## 2022-07-29 MED ORDER — ACETAMINOPHEN 10 MG/ML IV SOLN
INTRAVENOUS | Status: DC | PRN
  Administered 2022-07-29: 1000 mg via INTRAVENOUS

## 2022-07-29 MED ORDER — PHENYLEPHRINE 80 MCG/ML (10ML) SYRINGE FOR IV PUSH (FOR BLOOD PRESSURE SUPPORT)
PREFILLED_SYRINGE | INTRAVENOUS | Status: AC
  Filled 2022-07-29: qty 10

## 2022-07-29 MED ORDER — METHOCARBAMOL 1000 MG/10ML IJ SOLN
500.0000 mg | Freq: Four times a day (QID) | INTRAVENOUS | Status: AC | PRN
  Administered 2022-07-31: 500 mg via INTRAVENOUS
  Filled 2022-07-29 (×5): qty 5

## 2022-07-29 MED ORDER — PANTOPRAZOLE SODIUM 40 MG PO TBEC
40.0000 mg | DELAYED_RELEASE_TABLET | Freq: Every day | ORAL | Status: DC
  Administered 2022-07-30 – 2022-08-03 (×5): 40 mg via ORAL
  Filled 2022-07-29 (×5): qty 1

## 2022-07-29 MED ORDER — ROSUVASTATIN CALCIUM 5 MG PO TABS
10.0000 mg | ORAL_TABLET | Freq: Every day | ORAL | Status: DC
  Administered 2022-07-30 – 2022-08-03 (×5): 10 mg via ORAL
  Filled 2022-07-29 (×5): qty 2

## 2022-07-29 MED ORDER — ONDANSETRON HCL 4 MG/2ML IJ SOLN
INTRAMUSCULAR | Status: DC | PRN
  Administered 2022-07-29: 4 mg via INTRAVENOUS

## 2022-07-29 MED ORDER — LACTATED RINGERS IV SOLN: INTRAVENOUS | Status: DC | PRN

## 2022-07-29 MED ORDER — FENTANYL CITRATE (PF) 250 MCG/5ML IJ SOLN
INTRAMUSCULAR | Status: DC | PRN
  Administered 2022-07-29 (×3): 50 ug via INTRAVENOUS

## 2022-07-29 MED ORDER — PROPOFOL 10 MG/ML IV BOLUS
INTRAVENOUS | Status: DC | PRN
  Administered 2022-07-29: 100 mg via INTRAVENOUS
  Administered 2022-07-29 (×2): 50 mg via INTRAVENOUS

## 2022-07-29 MED ORDER — CEFAZOLIN SODIUM-DEXTROSE 2-3 GM-%(50ML) IV SOLR
INTRAVENOUS | Status: DC | PRN
  Administered 2022-07-29: 2 g via INTRAVENOUS

## 2022-07-29 MED ORDER — CEFAZOLIN SODIUM-DEXTROSE 2-4 GM/100ML-% IV SOLN
2.0000 g | Freq: Four times a day (QID) | INTRAVENOUS | Status: AC
  Administered 2022-07-29 – 2022-07-30 (×2): 2 g via INTRAVENOUS
  Filled 2022-07-29 (×2): qty 100

## 2022-07-29 MED ORDER — 0.9 % SODIUM CHLORIDE (POUR BTL) OPTIME
TOPICAL | Status: DC | PRN
  Administered 2022-07-29: 1000 mL

## 2022-07-29 MED ORDER — PHENYLEPHRINE 80 MCG/ML (10ML) SYRINGE FOR IV PUSH (FOR BLOOD PRESSURE SUPPORT)
PREFILLED_SYRINGE | INTRAVENOUS | Status: DC | PRN
  Administered 2022-07-29 (×4): 200 ug via INTRAVENOUS
  Administered 2022-07-29 (×2): 160 ug via INTRAVENOUS
  Administered 2022-07-29: 200 ug via INTRAVENOUS

## 2022-07-29 MED ORDER — LIDOCAINE 2% (20 MG/ML) 5 ML SYRINGE
INTRAMUSCULAR | Status: DC | PRN
  Administered 2022-07-29: 60 mg via INTRAVENOUS

## 2022-07-29 MED ORDER — FENTANYL CITRATE (PF) 250 MCG/5ML IJ SOLN
INTRAMUSCULAR | Status: AC
  Filled 2022-07-29: qty 5

## 2022-07-29 MED ORDER — ONDANSETRON HCL 4 MG/2ML IJ SOLN: 4.0000 mg | Freq: Four times a day (QID) | INTRAMUSCULAR | Status: DC | PRN

## 2022-07-29 MED ORDER — ONDANSETRON HCL 4 MG PO TABS: 4.0000 mg | ORAL_TABLET | Freq: Four times a day (QID) | ORAL | Status: DC | PRN

## 2022-07-29 MED ORDER — DOCUSATE SODIUM 100 MG PO CAPS
100.0000 mg | ORAL_CAPSULE | Freq: Two times a day (BID) | ORAL | Status: DC
  Administered 2022-07-29 – 2022-07-31 (×5): 100 mg via ORAL
  Filled 2022-07-29 (×5): qty 1

## 2022-07-29 MED ORDER — ENOXAPARIN SODIUM 40 MG/0.4ML IJ SOSY
40.0000 mg | PREFILLED_SYRINGE | INTRAMUSCULAR | Status: DC
  Administered 2022-07-30 – 2022-08-02 (×4): 40 mg via SUBCUTANEOUS
  Filled 2022-07-29 (×4): qty 0.4

## 2022-07-29 MED ORDER — SUGAMMADEX SODIUM 200 MG/2ML IV SOLN
INTRAVENOUS | Status: DC | PRN
  Administered 2022-07-29: 200 mg via INTRAVENOUS

## 2022-07-29 MED ORDER — MIDAZOLAM HCL 2 MG/2ML IJ SOLN
INTRAMUSCULAR | Status: DC | PRN
  Administered 2022-07-29: 2 mg via INTRAVENOUS

## 2022-07-29 MED ORDER — FENTANYL CITRATE (PF) 100 MCG/2ML IJ SOLN: 25.0000 ug | INTRAMUSCULAR | Status: DC | PRN

## 2022-07-29 MED ORDER — OXYCODONE HCL 5 MG PO TABS: 5.0000 mg | ORAL_TABLET | Freq: Four times a day (QID) | ORAL | 0 refills | Status: DC | PRN

## 2022-07-29 MED ORDER — HYDROMORPHONE HCL 1 MG/ML IJ SOLN
1.0000 mg | INTRAMUSCULAR | Status: DC | PRN
  Administered 2022-07-29 – 2022-08-01 (×4): 1 mg via INTRAVENOUS
  Filled 2022-07-29 (×4): qty 1

## 2022-07-29 MED ORDER — PROPOFOL 10 MG/ML IV BOLUS
INTRAVENOUS | Status: AC
  Filled 2022-07-29: qty 20

## 2022-07-29 MED ORDER — CEFAZOLIN SODIUM-DEXTROSE 2-4 GM/100ML-% IV SOLN
INTRAVENOUS | Status: AC
  Filled 2022-07-29: qty 100

## 2022-07-29 MED ORDER — PHENOL 1.4 % MT LIQD: 1.0000 | OROMUCOSAL | Status: DC | PRN

## 2022-07-29 MED ORDER — TAMSULOSIN HCL 0.4 MG PO CAPS
0.4000 mg | ORAL_CAPSULE | Freq: Every day | ORAL | Status: DC
  Administered 2022-07-29: 0.4 mg via ORAL
  Filled 2022-07-29 (×2): qty 1

## 2022-07-29 MED ORDER — DEXAMETHASONE SODIUM PHOSPHATE 10 MG/ML IJ SOLN
INTRAMUSCULAR | Status: AC
  Filled 2022-07-29: qty 1

## 2022-07-29 MED ORDER — MIDAZOLAM HCL 2 MG/2ML IJ SOLN
INTRAMUSCULAR | Status: AC
  Filled 2022-07-29: qty 2

## 2022-07-29 MED ORDER — ACETAMINOPHEN 10 MG/ML IV SOLN
INTRAVENOUS | Status: AC
  Filled 2022-07-29: qty 100

## 2022-07-29 SURGICAL SUPPLY — 44 items
ALCOHOL 70% 16 OZ (MISCELLANEOUS) ×2 IMPLANT
BAG COUNTER SPONGE SURGICOUNT (BAG) ×2 IMPLANT
BAG SPNG CNTER NS LX DISP (BAG) ×1
BIT DRILL 4.0X165 AO STYLE (BIT) IMPLANT
BNDG CMPR 5X6 CHSV STRCH STRL (GAUZE/BANDAGES/DRESSINGS) ×2
BNDG COHESIVE 6X5 TAN ST LF (GAUZE/BANDAGES/DRESSINGS) ×4 IMPLANT
CANISTER SUCT 3000ML PPV (MISCELLANEOUS) ×2 IMPLANT
COVER PERINEAL POST (MISCELLANEOUS) ×2 IMPLANT
COVER SURGICAL LIGHT HANDLE (MISCELLANEOUS) ×2 IMPLANT
DRAPE C-ARM 42X72 X-RAY (DRAPES) ×2 IMPLANT
DRAPE HALF SHEET 40X57 (DRAPES) IMPLANT
DRAPE INCISE IOBAN 66X45 STRL (DRAPES) ×2 IMPLANT
DRAPE STERI IOBAN 125X83 (DRAPES) ×2 IMPLANT
DRSG ADAPTIC 3X8 NADH LF (GAUZE/BANDAGES/DRESSINGS) ×2 IMPLANT
DURAPREP 26ML APPLICATOR (WOUND CARE) ×2 IMPLANT
ELECT CAUTERY BLADE 6.4 (BLADE) ×2 IMPLANT
ELECT REM PT RETURN 9FT ADLT (ELECTROSURGICAL) ×1
ELECTRODE REM PT RTRN 9FT ADLT (ELECTROSURGICAL) ×2 IMPLANT
GAUZE SPONGE 4X4 12PLY STRL (GAUZE/BANDAGES/DRESSINGS) IMPLANT
GAUZE SPONGE 4X4 12PLY STRL LF (GAUZE/BANDAGES/DRESSINGS) ×2 IMPLANT
GLOVE BIO SURGEON STRL SZ7.5 (GLOVE) ×4 IMPLANT
GLOVE BIOGEL PI IND STRL 8 (GLOVE) ×4 IMPLANT
GOWN STRL REUS W/ TWL LRG LVL3 (GOWN DISPOSABLE) ×2 IMPLANT
GOWN STRL REUS W/ TWL XL LVL3 (GOWN DISPOSABLE) ×4 IMPLANT
GOWN STRL REUS W/TWL LRG LVL3 (GOWN DISPOSABLE) ×1
GOWN STRL REUS W/TWL XL LVL3 (GOWN DISPOSABLE) ×2
GUIDEWIRE BALL NOSE 3.0X900 (WIRE) ×1
GUIDEWIRE ORTH 900X3XBALL NOSE (WIRE) IMPLANT
KIT BASIN OR (CUSTOM PROCEDURE TRAY) ×2 IMPLANT
KIT TURNOVER KIT B (KITS) ×2 IMPLANT
NAIL IM FEM 10X39 130D LT (Nail) IMPLANT
NS IRRIG 1000ML POUR BTL (IV SOLUTION) ×2 IMPLANT
PACK GENERAL/GYN (CUSTOM PROCEDURE TRAY) ×2 IMPLANT
PAD ARMBOARD 7.5X6 YLW CONV (MISCELLANEOUS) ×4 IMPLANT
PIN GUIDE THRD AR 3.2X330 (PIN) IMPLANT
SCREW LAG GALILEO 10.5X105 (Screw) IMPLANT
SCREW LOCK CORT 5.0X40 (Screw) IMPLANT
STAPLER VISISTAT 35W (STAPLE) ×2 IMPLANT
SUT MON AB 2-0 CT1 36 (SUTURE) ×2 IMPLANT
SYR BULB IRRIG 60ML STRL (SYRINGE) IMPLANT
TOOL ACTIVATION (INSTRUMENTS) IMPLANT
TOWEL GREEN STERILE (TOWEL DISPOSABLE) ×2 IMPLANT
TOWEL GREEN STERILE FF (TOWEL DISPOSABLE) ×2 IMPLANT
WATER STERILE IRR 1000ML POUR (IV SOLUTION) ×2 IMPLANT

## 2022-07-29 NOTE — Consult Note (Signed)
ORTHOPAEDIC CONSULTATION  REQUESTING PHYSICIAN: Synetta Fail, MD  PCP:  [name], MD  Chief Complaint: Left hip fracture  HPI: Randall Reeves is a 77 y.o. male who complains of left hip pain and deformity following a fall from a ladder earlier this morning.  Otherwise healthy individual with remote history of chronic kidney disease and coronary artery disease but very stable at this time.  Denies diabetes or smoking.  He is independent and lives with his wife.  Currently complaining of left hip pain 1.  No head trauma or pain.  No numbness or tingling.  No history of previous hip surgery.  Past Medical History:  Diagnosis Date   Allergy    Arthritis    Cataract    CKD (chronic kidney disease), stage III (HCC)    Coronary artery disease, non-occlusive 09/2009   Minimal CAD / no flow-limiting lesions on Cath; CPX February 2022: 1200 exercise, reached 91% image PRN.  Peak VO2 26.5-normal= excellent functional capacity.  High anaerobic threshold suggest excellent trending effect.  At peak exercise, limited by obesity.:  Normal CPX   GERD (gastroesophageal reflux disease)    Hyperlipidemia    Hypertension    Myocardial infarction - Type 2a MI 2015    Felt to be Type II MI-demand infarct   Neuromuscular disorder (HCC)    Obstructive sleep apnea    Cpap as of 27 days ago   Sleep apnea    wears cpap    Syncope    due to hypotension from BP meds    Past Surgical History:  Procedure Laterality Date   CARDIOPULMONARY STRESS TEST  03/17/2020   PreEx PFTs: FVC 4.33 (105%), FEV1 3.23 (104%) FEV1/FVC 75 (98% ) MVV 90 (73%): NORMAL; Exercise 12 min;hip and knee fatigue and mild dyspnea.  Rest HR 80 bpm-> max HR 135 bpm (91% of MPHR); BP 124/76 up to 166/74 mmHg; good exercise effort. Peak VO2 26.5 (108% predicted); VE/VCO2 slope 30.  Peak RER 1.1, ventilator threshold 20.6 (84% predicted, 78% measured peak VO2).  Pulse ox remained 94 to 99%   CARPAL TUNNEL RELEASE Left 02/2013    COLONOSCOPY     INGUINAL HERNIA REPAIR Left 1983   with vasectomy   LEFT HEART CATH AND CORONARY ANGIOGRAPHY  10/20/2009   (In the setting of MI with trivial troponin elevation). LM normal --<LAD, LCx. LAD (2 small Diags) - no significant disease, LCx (co-dom with PDA) & 2 OMs (OM2 moderate size with faint "hazy" defect - not flow limiting. Co-dom RCA with minor irregularities.  EF ~60%. ? Mild Inf HK. --> Med Rx.  Felt to be Type II MI-demand infarct   POLYPECTOMY     SPERMATOCELECTOMY  1998   TRANSTHORACIC ECHOCARDIOGRAM  10/2012; 12/2015   a) EF 55 to 60%.  GR 1 DD.  Normal motion.  Normal valves.;; b) Normal LV size and function.  EF 55 to 60%.  GR 2 DD.  Normal valves.   VASECTOMY  1983   with inguinal hernia repair   Social History   Socioeconomic History   Marital status: Married    Spouse name: Not on file   Number of children: 2   Years of education: Not on file   Highest education level: Not on file  Occupational History   Occupation: landscaping  Tobacco Use   Smoking status: Never    Passive exposure: Past   Smokeless tobacco: Never  Vaping Use   Vaping Use: Never used  Substance and Sexual Activity   Alcohol use: Yes    Alcohol/week: 3.0 - 5.0 standard drinks of alcohol    Types: 3 - 5 Cans of beer per week    Comment:  a few beers in a week.   Drug use: No   Sexual activity: Not on file  Other Topics Concern   Not on file  Social History Narrative   Has living will   Wife is his health care POA--alternate is son   Would accept resuscitation attempts.   Would leave feeding tube decision to his wife   Social Determinants of Health   Financial Resource Strain: Not on file  Food Insecurity: No Food Insecurity (07/15/2022)   Hunger Vital Sign    Worried About Running Out of Food in the Last Year: Never true    Ran Out of Food in the Last Year: Never true  Transportation Needs: No Transportation Needs (07/15/2022)   PRAPARE - Scientist, research (physical sciences) (Medical): No    Lack of Transportation (Non-Medical): No  Physical Activity: Not on file  Stress: Not on file  Social Connections: Not on file   Family History  Problem Relation Age of Onset   Stroke Mother    Hypertension Mother    Diabetes Father    Asthma Father    Heart failure Father    Heart disease Father    Hypertension Sister    Diabetes Sister    Neuropathy Brother    Hypertension Brother    Diabetes Brother    Heart disease Brother    Prostate cancer Brother    Hypertension Brother    Colon polyps Brother    Hypertension Brother    Hypertension Brother    Rectal cancer Neg Hx    Stomach cancer Neg Hx    Esophageal cancer Neg Hx    Colon cancer Neg Hx    Allergies  Allergen Reactions   Bisoprolol-Hydrochlorothiazide     REACTION: muscle pain, cramps   Codeine Nausea Only   Imipramine Other (See Comments)    Dry mouth, dizziness, headaches, increased sweating    Tamsulosin Other (See Comments)    Dizziness    Prior to Admission medications   Medication Sig Start Date End Date Taking? Authorizing Provider  acetaminophen (TYLENOL) 500 MG tablet Take 1,000 mg by mouth every 8 (eight) hours as needed.    [provider]  aspirin EC 81 MG tablet Take 81 mg by mouth daily.    [provider]  losartan (COZAAR) 50 MG tablet TAKE ONE TABLET BY MOUTH ONCE DAILY 04/08/22   Tillman Abide I, MD  LUTEIN PO Take 1 tablet by mouth every evening.    [provider]  Multiple Vitamin (MULTIVITAMIN) tablet Take 1 tablet by mouth daily.    [provider]  omeprazole (PRILOSEC) 20 MG capsule Take 1 capsule (20 mg total) by mouth daily. 05/27/22   [name], MD  OVER THE COUNTER MEDICATION Chlortrimetron allergy relief prn allergies    [provider]  rosuvastatin (CRESTOR) 10 MG tablet TAKE ONE TABLET BY MOUTH ONCE DAILY 04/08/22   [name], MD  tamsulosin (FLOMAX) 0.4 MG CAPS capsule Take 1 capsule  (0.4 mg total) by mouth daily. 05/27/22   [name], MD  trimethoprim-polymyxin b (POLYTRIM) ophthalmic solution Place 1 drop into both eyes every 6 (six) hours. 05/27/22   [name], MD   CT Hip Left Wo Contrast  Result Date: 07/29/2022 CLINICAL DATA:  Femoral neck fracture, patient presents for further characterization. EXAM: CT OF THE LEFT HIP WITHOUT CONTRAST TECHNIQUE: Multidetector CT imaging of the left hip was performed according to the standard protocol. Multiplanar CT image reconstructions were also generated. RADIATION DOSE REDUCTION: This exam was performed according to the departmental dose-optimization program which includes automated exposure control, adjustment of the mA and/or kV according to patient size and/or use of iterative reconstruction technique. COMPARISON:  None Available. FINDINGS: Bones/Joint/Cartilage Comminuted and angulated left intertrochanteric fracture with an oblique fracture component extending into the proximal diaphysis with 2.9 cm of posteromedial displacement of the major fracture fragment. Oblique fracture cleft involving the subcapital left femoral neck. Moderate osteoarthritis of the left hip. No other acute fracture or dislocation. No fracture or dislocation. Normal alignment. No joint effusion. Ligaments Ligaments are suboptimally evaluated by CT. Muscles and Tendons Muscles are normal. No muscle atrophy. No intramuscular fluid collection or hematoma. Soft tissue No fluid collection or hematoma.  No soft tissue mass. IMPRESSION: 1. Acute comminuted and angulated left intertrochanteric fracture with an oblique fracture component extending into the proximal diaphysis with 2.9 cm of posteromedial displacement of the major fracture fragment. 2. Acute subcapital left femoral neck fracture. Electronically Signed   By: Elige Ko M.D.   On: 07/29/2022 13:03   DG Pelvis Portable  Result Date: 07/29/2022 CLINICAL DATA:  Patient fell off a ladder. EXAM:  PORTABLE PELVIS 1-2 VIEWS COMPARISON:  04/23/2018 FINDINGS: Severely comminuted left intertrochanteric femoral neck fracture identified with varus angulation. Fracture line extends into the proximal diaphysis. SI joints and symphysis pubis unremarkable. No evidence for pubic ramus fracture. IMPRESSION: Severely comminuted left intertrochanteric femoral neck fracture with varus angulation. Electronically Signed   By: Kennith Center M.D.   On: 07/29/2022 12:37   DG Femur Portable Min 2 Views Left  Result Date: 07/29/2022 CLINICAL DATA:  Patient fell off a ladder. EXAM: LEFT FEMUR PORTABLE 2 VIEWS COMPARISON:  None Available. FINDINGS: Severely comminuted intertrochanteric left femoral neck fracture with varus angulation. Fracture line extends into the proximal femoral diaphysis. Calcifications in the region of the popliteal fossa on the lateral film are probably loose bodies in a Baker's cyst. IMPRESSION: Severely comminuted intertrochanteric left femoral neck fracture with varus angulation. Electronically Signed   By: Kennith Center M.D.   On: 07/29/2022 12:30   DG Chest Port 1 View  Result Date: 07/29/2022 CLINICAL DATA:  Status post fall EXAM: PORTABLE CHEST 1 VIEW COMPARISON:  04/23/2018 FINDINGS: The heart size and mediastinal contours are within normal limits. Both lungs are clear. The visualized skeletal structures are unremarkable. IMPRESSION: No active disease. Electronically Signed   By: Elige Ko M.D.   On: 07/29/2022 12:25    Positive ROS: All other systems have been reviewed and were otherwise negative with the exception of those mentioned in the HPI and as above.  Physical Exam: General: Alert, no acute distress Cardiovascular: No pedal edema Respiratory: No cyanosis, no use of accessory musculature GI: No organomegaly, abdomen is soft and non-tender Skin: No lesions in the area of chief complaint Neurologic: Sensation intact distally Psychiatric: Patient is competent for consent with  normal mood and affect Lymphatic: No axillary or cervical lymphadenopathy  MUSCULOSKELETAL: Left lower extremity is warm and well-perfused.  He has Buck's traction hanging off the end of the bed.  Otherwise neurovascular intact with good 2+ dorsalis pedis pulse and motor and sensory intact.  Assessment: Left intertrochanteric/peritrochanteric hip fracture closed.  Plan: Plan to  proceed with intramedullary nail for definitive treatment.  We discussed the risk benefits in detail.  Return to the hospital service postoperatively for standard postoperative PT.  Disposition planning per hospitalist service.  The risks, benefits, and alternatives were discussed with the patient. There are risks associated with the surgery including, but not limited to, problems with anesthesia (death), infection, differences in leg length/angulation/rotation, fracture of bones, loosening or failure of implants, malunion, nonunion, hematoma (blood accumulation) which Favaro require surgical drainage, blood clots, pulmonary embolism, nerve injury (foot drop), and blood vessel injury. The patient understands these risks and elects to proceed.     Yolonda Kida, MD Cell [phone]    07/29/2022 4:34 PM.co

## 2022-07-29 NOTE — Progress Notes (Signed)
   07/29/22 1200  Spiritual Encounters  Type of Visit Initial  Care provided to: Patient  Conversation partners present during encounter Nurse  Referral source Trauma page  Reason for visit Trauma  OnCall Visit No   Responded to page, patient with medical team. Patient requested spouse Bonita Quin be contacted and advised at [phone], spouse was aware as she was present when EMS transported patient. Spouse advised, patient taken for CT Scan.

## 2022-07-29 NOTE — Discharge Instructions (Addendum)
Orthopedic surgery discharge instructions:  -Okay for up to 50% weightbearing to left lower extremity.  -Okay to take a shower with bandages in place.  If these become saturated he should change for daily dry dressings.  -Do not submerge incisions underwater.  -Apply ice to your incisions 4-5 times per day for 30 minutes at a time.  -For mild to moderate pain use Tylenol on as-needed basis.  He should do this around-the-clock.  -For breakthrough pain use hydrocodone as necessary.  -For the prevention of blood clot she should take an 81 mg aspirin twice per day x 6 weeks.  -He will follow-up with Dr. Aundria Rud in 2 weeks from surgery for routine postoperative check and staple removal. Call the office to schedule.     Information on my medicine - ELIQUIS (apixaban)  This medication education was reviewed with me or my healthcare representative as part of my discharge preparation.  The pharmacist that spoke with me during my hospital stay was:  Angelik Walls, RPH  Why was Eliquis prescribed for you? Eliquis was prescribed for you to reduce the risk of forming blood clots that can cause a stroke if you have a medical condition called atrial fibrillation (a type of irregular heartbeat) OR to reduce the risk of a blood clots forming after orthopedic surgery.  What do You need to know about Eliquis ? Take your Eliquis TWICE DAILY - one tablet in the morning and one tablet in the evening with or without food.  It would be best to take the doses about the same time each day.  If you have difficulty swallowing the tablet whole please discuss with your pharmacist how to take the medication safely.  Take Eliquis exactly as prescribed by your doctor and DO NOT stop taking Eliquis without talking to the doctor who prescribed the medication.  Stopping Steines increase your risk of developing a new clot or stroke.  Refill your prescription before you run out.  After discharge, you should have  regular check-up appointments with your healthcare provider that is prescribing your Eliquis.  In the future your dose Milosevic need to be changed if your kidney function or weight changes by a significant amount or as you get older.  What do you do if you miss a dose? If you miss a dose, take it as soon as you remember on the same day and resume taking twice daily.  Do not take more than one dose of ELIQUIS at the same time.  Important Safety Information A possible side effect of Eliquis is bleeding. You should call your healthcare provider right away if you experience any of the following: Bleeding from an injury or your nose that does not stop. Unusual colored urine (red or dark brown) or unusual colored stools (red or black). Unusual bruising for unknown reasons. A serious fall or if you hit your head (even if there is no bleeding).  Some medicines Storey interact with Eliquis and might increase your risk of bleeding or clotting while on Eliquis. To help avoid this, consult your healthcare provider or pharmacist prior to using any new prescription or non-prescription medications, including herbals, vitamins, non-steroidal anti-inflammatory drugs (NSAIDs) and supplements.  This website has more information on Eliquis (apixaban): http://www.eliquis.com/eliquis/home

## 2022-07-29 NOTE — ED Triage Notes (Signed)
Mechanical fall. 8 ft ladder. No LOC. Not on blood thinners. Alert and oriented x 4. EMS gave Fentanyl and 4mg  Zofran. Obvious deformity to left hip/femur area.

## 2022-07-29 NOTE — Transfer of Care (Signed)
Immediate Anesthesia Transfer of Care Note  Patient: Randall Reeves  Procedure(s) Performed: INTRAMEDULLARY (IM) NAIL INTERTROCHANTERIC (Left: Leg Upper)  Patient Location: PACU  Anesthesia Type:General  Level of Consciousness: drowsy and patient cooperative  Airway & Oxygen Therapy: Patient Spontanous Breathing and Patient connected to face mask oxygen  Post-op Assessment: Report given to RN and Post -op Vital signs reviewed and stable  Post vital signs: Reviewed and stable  Last Vitals:  Vitals Value Taken Time  BP 91/67 07/29/22 1833  Temp    Pulse 99 07/29/22 1835  Resp 19 07/29/22 1835  SpO2 96 % 07/29/22 1835  Vitals shown include unvalidated device data.  Last Pain:  Vitals:   07/29/22 1434  TempSrc: Oral  PainSc:          Complications: No notable events documented.

## 2022-07-29 NOTE — Brief Op Note (Signed)
07/29/2022  6:16 PM  PATIENT:  Randall Reeves  76 y.o. male  PRE-OPERATIVE DIAGNOSIS:  left intertrochanteric fracture  POST-OPERATIVE DIAGNOSIS:   1.  Left hip peritrochanteric fracture 2.  Left hip femoral neck fracture  PROCEDURE:  Procedure(s): INTRAMEDULLARY (IM) NAIL INTERTROCHANTERIC (Left)  SURGEON:  Surgeon(s) and Role:    * Yolonda Kida, MD - Primary  PHYSICIAN ASSISTANT:  Dion Saucier, PA-C   ANESTHESIA:   general  EBL:  150 cc  BLOOD ADMINISTERED:none  DRAINS: none   LOCAL MEDICATIONS USED:  NONE  SPECIMEN:  No Specimen  DISPOSITION OF SPECIMEN:  N/A  COUNTS:  YES  TOURNIQUET:  * No tourniquets in log *  DICTATION: .Note written in EPIC  PLAN OF CARE: Admit to inpatient   PATIENT DISPOSITION:  PACU - hemodynamically stable.   Delay start of Pharmacological VTE agent (>24hrs) due to surgical blood loss or risk of bleeding: not applicable

## 2022-07-29 NOTE — ED Provider Notes (Signed)
[place] EMERGENCY DEPARTMENT AT [facility] Provider Note   CSN: [id number] Arrival date & time: 07/29/22  1143     History  Chief Complaint  Patient presents with   Randall Reeves is a 77 y.o. male.  Patient is a 77 year old male with a history of hypertension and hyperlipidemia who is presenting today as a level 2 trauma after falling off a ladder landing on his left hip.  Patient reports he was on the fourth rung of the ladder when he lost his balance and fell onto his left hip on the concrete.  He denies hitting his head, loss of consciousness or injury anywhere other than his left hip.  He was not able to stand or walk.  He does have normal sensation in his left foot and he can move his toes.  He is not having any rib pain, shortness of breath or abdominal pain.  He denies any upper extremity discomfort.  He takes a baby aspirin but no other anticoagulants.  EMS reports that upon their arrival he was awake and alert and oriented.  He has been hemodynamically stable.  They had to give 200 mcg of fentanyl and route due to his significant pain.  The history is provided by the patient and the EMS personnel.  Fall       Home Medications Prior to Admission medications   Medication Sig Start Date End Date Taking? Authorizing Provider  acetaminophen (TYLENOL) 500 MG tablet Take 1,000 mg by mouth every 8 (eight) hours as needed.    [provider]  aspirin EC 81 MG tablet Take 81 mg by mouth daily.    [provider]  losartan (COZAAR) 50 MG tablet TAKE ONE TABLET BY MOUTH ONCE DAILY 04/08/22   Tillman Abide I, MD  LUTEIN PO Take 1 tablet by mouth every evening.    [provider]  Multiple Vitamin (MULTIVITAMIN) tablet Take 1 tablet by mouth daily.    [provider]  omeprazole (PRILOSEC) 20 MG capsule Take 1 capsule (20 mg total) by mouth daily. 05/27/22   [name], MD  OVER THE COUNTER MEDICATION Chlortrimetron allergy  relief prn allergies    [provider]  rosuvastatin (CRESTOR) 10 MG tablet TAKE ONE TABLET BY MOUTH ONCE DAILY 04/08/22   [name], MD  tamsulosin (FLOMAX) 0.4 MG CAPS capsule Take 1 capsule (0.4 mg total) by mouth daily. 05/27/22   [name], MD  trimethoprim-polymyxin b (POLYTRIM) ophthalmic solution Place 1 drop into both eyes every 6 (six) hours. 05/27/22   [name], MD      Allergies    Bisoprolol-hydrochlorothiazide, Codeine, Imipramine, and Tamsulosin    Review of Systems   Review of Systems  Physical Exam Updated Vital Signs BP 132/82 (BP Location: Right Arm)   Pulse 77   Temp (!) 96.5 F (35.8 C) (Temporal)   Resp (!) 9   Ht 5\' 8"  (1.727 m)   Wt 85.7 kg   SpO2 97%   BMI 28.73 kg/m  Physical Exam Vitals and nursing note reviewed.  Constitutional:      General: He is not in acute distress.    Appearance: He is well-developed.  HENT:     Head: Normocephalic and atraumatic.  Eyes:     Conjunctiva/sclera: Conjunctivae normal.     Pupils: Pupils are equal, round, and reactive to light.  Cardiovascular:     Rate and Rhythm: Normal rate and regular  rhythm.     Pulses: Normal pulses.     Heart sounds: No murmur heard. Pulmonary:     Effort: Pulmonary effort is normal. No respiratory distress.     Breath sounds: Normal breath sounds. No wheezing or rales.  Abdominal:     General: There is no distension.     Palpations: Abdomen is soft.     Tenderness: There is no abdominal tenderness. There is no guarding or rebound.  Musculoskeletal:        General: Tenderness, deformity and signs of injury present.     Cervical back: Normal range of motion and neck supple. No tenderness.     Left hip: Deformity and tenderness present. Decreased range of motion.     Right lower leg: No edema.     Left lower leg: No edema.     Comments: Patient has normal sensation in the left foot and is able to wiggle the toes.  Deformity noted at the left hip with  external rotation and shortening  Skin:    General: Skin is warm and dry.     Findings: No erythema or rash.  Neurological:     Mental Status: He is alert and oriented to person, place, and time. Mental status is at baseline.  Psychiatric:        Mood and Affect: Mood normal.        Behavior: Behavior normal.     ED Results / Procedures / Treatments   Labs (all labs ordered are listed, but only abnormal results are displayed) Labs Reviewed  I-STAT CHEM 8, ED - Abnormal; Notable for the following components:      Result Value   BUN 27 (*)    Creatinine, Ser 1.50 (*)    Glucose, Bld 140 (*)    Calcium, Ion 1.00 (*)    All other components within normal limits  CBC  ETHANOL  PROTIME-INR  COMPREHENSIVE METABOLIC PANEL  URINALYSIS, ROUTINE W REFLEX MICROSCOPIC  LACTIC ACID, PLASMA  SAMPLE TO BLOOD BANK    EKG None  Radiology DG Femur Portable Min 2 Views Left  Result Date: 07/29/2022 CLINICAL DATA:  Patient fell off a ladder. EXAM: LEFT FEMUR PORTABLE 2 VIEWS COMPARISON:  None Available. FINDINGS: Severely comminuted intertrochanteric left femoral neck fracture with varus angulation. Fracture line extends into the proximal femoral diaphysis. Calcifications in the region of the popliteal fossa on the lateral film are probably loose bodies in a Baker's cyst. IMPRESSION: Severely comminuted intertrochanteric left femoral neck fracture with varus angulation. Electronically Signed   By: Kennith Center M.D.   On: 07/29/2022 12:30   DG Chest Port 1 View  Result Date: 07/29/2022 CLINICAL DATA:  Status post fall EXAM: PORTABLE CHEST 1 VIEW COMPARISON:  04/23/2018 FINDINGS: The heart size and mediastinal contours are within normal limits. Both lungs are clear. The visualized skeletal structures are unremarkable. IMPRESSION: No active disease. Electronically Signed   By: Elige Ko M.D.   On: 07/29/2022 12:25    Procedures Procedures    Medications Ordered in ED Medications   HYDROmorphone (DILAUDID) injection 1 mg (1 mg Intravenous Given 07/29/22 1203)    ED Course/ Medical Decision Making/ A&P                             Medical Decision Making Amount and/or Complexity of Data Reviewed Independent Historian: EMS Labs: ordered. Decision-making details documented in ED Course. Radiology: ordered and independent interpretation performed.  Decision-making details documented in ED Course.  Risk Prescription drug management. Parenteral controlled substances. Decision regarding hospitalization.   Pt  presenting today with a complaint that caries a high risk for morbidity and mortality.  Here today after falling off a ladder and having deformity and pain of the left hip.  Patient is hemodynamically stable and able to answer all questions.  He remembers the entire event had no history of head injury and takes no anticoagulants.  No trauma noted to the head chest or abdomen.  Upper extremities appear normal.  Concern for hip fracture/dislocation or femur fracture.  Low suspicion for any chest or abdominal trauma.  Patient fell from the fourth rung so approximately 4 to 5 feet off the ground.  I have independently visualized and interpreted pt's images today.  Chest x-ray without acute findings, hip film shows a comminuted displaced angulated fracture through the neck and proximal shaft of the femur.  He is hemodynamically stable at this time and has normal sensation and pulse in his foot.  Will consult orthopedic surgery.  Patient ordered as needed pain medication but did receive 200 mcg of fentanyl prior to arrival and appears to be comfortable if he is not being moved.  12:32 PM Spoke with Dr. Aundria Rud with orthopedic surgery who evaluated the films and is requesting a CT of the left hip and also that patient be placed in Buck's traction 15 pounds.  On reevaluation patient is comfortable at this time.  [place] go to surgery later today.  I independently interpreted patient's  labs and i-STAT Chem-8 without significant findings, CBC within normal limits.  Will admit for further care as pt does require admission.          Final Clinical Impression(s) / ED Diagnoses Final diagnoses:  Fall, initial encounter  Closed fracture of left hip, initial encounter [facility])    Rx / DC Orders ED Discharge Orders     None         Gwyneth Sprout, MD 07/29/22 1233

## 2022-07-29 NOTE — Anesthesia Procedure Notes (Signed)
Procedure Name: Intubation Date/Time: 07/29/2022 4:57 PM  Performed by: Rosiland Oz, CRNAPre-anesthesia Checklist: Patient identified, Emergency Drugs available, Suction available, Patient being monitored and Timeout performed Patient Re-evaluated:Patient Re-evaluated prior to induction Oxygen Delivery Method: Circle system utilized Preoxygenation: Pre-oxygenation with 100% oxygen Induction Type: IV induction Ventilation: Mask ventilation without difficulty Laryngoscope Size: Miller and 3 Grade View: Grade I Tube type: Oral Tube size: 7.5 mm Number of attempts: 1 Airway Equipment and Method: Stylet Placement Confirmation: ETT inserted through vocal cords under direct vision, positive ETCO2 and breath sounds checked- equal and bilateral Secured at: 22 cm Tube secured with: Tape Dental Injury: Teeth and Oropharynx as per pre-operative assessment

## 2022-07-29 NOTE — Progress Notes (Signed)
Orthopedic Tech Progress Note Patient Details:  Randall Reeves Dec 12, 1945 [id number]  Level II trauma. We will return after CT to apply traction.  Patient ID: Randall Reeves, male   DOB: 1946/01/24, 77 y.o.   MRN: [id number]  Randall Reeves 07/29/2022, 12:37 PM

## 2022-07-29 NOTE — H&P (Signed)
History and Physical   Randall Reeves WUJ:[id number] DOB: 1945/07/02 DOA: 07/29/2022  PCP: [name], MD   Patient coming from: Home  Chief Complaint: Fall, hip pain  HPI: Randall Reeves is a 77 y.o. male with medical history significant of CKD 3A, neuropathy, hypertension, hyperlipidemia, GERD, OSA, CAD, BPH, splenic and renal artery aneurysms presenting after a fall at home.  Patient reports that he fell off his ladder and landed on his left hip.  He states he was on the fourth rung and lost his balance and fell onto his left hip on concrete.  Was unable to stand/walk afterwards.  Denies hitting his head or any other injuries.  Not on any anticoagulation.  Further denies fevers, chills, chest pain, shortness of breath, abdominal pain, constipation, diarrhea, nausea, vomiting.  Surgical scar  ED Course: Vital signs in the ED notable for temperature of 96.5 but otherwise stable.  Lab workup included CMP with creatinine stable 1.49, glucose 140, calcium 8.6.  CBC within normal limits.  PT and INR within normal limits.  Lactic acid normal.  Ethanol level negative.  Urinalysis pending.  Chest x-ray showed no acute abnormality.  Pelvis x-ray and left femur x-ray showed severe comminuted left intratrochanteric femur head fracture.  CT of the hip confirmed femur head fracture and also subcapital fracture.  Patient received Dilaudid in the ED.  Orthopedic consulted and are considering surgical intervention today but requested CT considering possible complexity also recommended traction.  Review of Systems: As per HPI otherwise all other systems reviewed and are negative.  Past Medical History:  Diagnosis Date   Allergy    Arthritis    Cataract    CKD (chronic kidney disease), stage III (HCC)    Coronary artery disease, non-occlusive 09/2009   Minimal CAD / no flow-limiting lesions on Cath; CPX February 2022: 1200 exercise, reached 91% image PRN.  Peak VO2 26.5-normal= excellent functional capacity.   High anaerobic threshold suggest excellent trending effect.  At peak exercise, limited by obesity.:  Normal CPX   GERD (gastroesophageal reflux disease)    Hyperlipidemia    Hypertension    Myocardial infarction - Type 2a MI 2015    Felt to be Type II MI-demand infarct   Neuromuscular disorder (HCC)    Obstructive sleep apnea    Cpap as of 27 days ago   Sleep apnea    wears cpap    Syncope    due to hypotension from BP meds     Past Surgical History:  Procedure Laterality Date   CARDIOPULMONARY STRESS TEST  03/17/2020   PreEx PFTs: FVC 4.33 (105%), FEV1 3.23 (104%) FEV1/FVC 75 (98% ) MVV 90 (73%): NORMAL; Exercise 12 min;hip and knee fatigue and mild dyspnea.  Rest HR 80 bpm-> max HR 135 bpm (91% of MPHR); BP 124/76 up to 166/74 mmHg; good exercise effort. Peak VO2 26.5 (108% predicted); VE/VCO2 slope 30.  Peak RER 1.1, ventilator threshold 20.6 (84% predicted, 78% measured peak VO2).  Pulse ox remained 94 to 99%   CARPAL TUNNEL RELEASE Left 02/2013   COLONOSCOPY     INGUINAL HERNIA REPAIR Left 1983   with vasectomy   LEFT HEART CATH AND CORONARY ANGIOGRAPHY  10/20/2009   (In the setting of MI with trivial troponin elevation). LM normal --<LAD, LCx. LAD (2 small Diags) - no significant disease, LCx (co-dom with PDA) & 2 OMs (OM2 moderate size with faint "hazy" defect - not flow limiting. Co-dom RCA with minor irregularities.  EF ~  60%. ? Mild Inf HK. --> Med Rx.  Felt to be Type II MI-demand infarct   POLYPECTOMY     SPERMATOCELECTOMY  1998   TRANSTHORACIC ECHOCARDIOGRAM  10/2012; 12/2015   a) EF 55 to 60%.  GR 1 DD.  Normal motion.  Normal valves.;; b) Normal LV size and function.  EF 55 to 60%.  GR 2 DD.  Normal valves.   VASECTOMY  1983   with inguinal hernia repair    Social History  reports that he has never smoked. He has been exposed to tobacco smoke. He has never used smokeless tobacco. He reports current alcohol use of about 3.0 - 5.0 standard drinks of alcohol per week. He  reports that he does not use drugs.  Allergies  Allergen Reactions   Bisoprolol-Hydrochlorothiazide     REACTION: muscle pain, cramps   Codeine Nausea Only   Imipramine Other (See Comments)    Dry mouth, dizziness, headaches, increased sweating    Tamsulosin Other (See Comments)    Dizziness     Family History  Problem Relation Age of Onset   Stroke Mother    Hypertension Mother    Diabetes Father    Asthma Father    Heart failure Father    Heart disease Father    Hypertension Sister    Diabetes Sister    Neuropathy Brother    Hypertension Brother    Diabetes Brother    Heart disease Brother    Prostate cancer Brother    Hypertension Brother    Colon polyps Brother    Hypertension Brother    Hypertension Brother    Rectal cancer Neg Hx    Stomach cancer Neg Hx    Esophageal cancer Neg Hx    Colon cancer Neg Hx   Reviewed on admission  Prior to Admission medications   Medication Sig Start Date End Date Taking? Authorizing Provider  acetaminophen (TYLENOL) 500 MG tablet Take 1,000 mg by mouth every 8 (eight) hours as needed.    [provider]  aspirin EC 81 MG tablet Take 81 mg by mouth daily.    [provider]  losartan (COZAAR) 50 MG tablet TAKE ONE TABLET BY MOUTH ONCE DAILY 04/08/22   Tillman Abide I, MD  LUTEIN PO Take 1 tablet by mouth every evening.    [provider]  Multiple Vitamin (MULTIVITAMIN) tablet Take 1 tablet by mouth daily.    [provider]  omeprazole (PRILOSEC) 20 MG capsule Take 1 capsule (20 mg total) by mouth daily. 05/27/22   [name], MD  OVER THE COUNTER MEDICATION Chlortrimetron allergy relief prn allergies    [provider]  rosuvastatin (CRESTOR) 10 MG tablet TAKE ONE TABLET BY MOUTH ONCE DAILY 04/08/22   [name], MD  tamsulosin (FLOMAX) 0.4 MG CAPS capsule Take 1 capsule (0.4 mg total) by mouth daily. 05/27/22   [name], MD  trimethoprim-polymyxin b (POLYTRIM)  ophthalmic solution Place 1 drop into both eyes every 6 (six) hours. 05/27/22   [name], MD    Physical Exam: Vitals:   07/29/22 1152 07/29/22 1230 07/29/22 1245 07/29/22 1300  BP: 132/82 139/86 132/71 139/85  Pulse: 77 77 81 83  Resp: (!) 9 15 14 12   Temp: (!) 96.5 F (35.8 C)     TempSrc: Temporal     SpO2: 97% 99% 100% 100%  Weight:      Height:        Physical Exam Constitutional:  General: He is not in acute distress.    Appearance: Normal appearance.  HENT:     Head: Normocephalic and atraumatic.     Mouth/Throat:     Mouth: Mucous membranes are moist.     Pharynx: Oropharynx is clear.  Eyes:     Extraocular Movements: Extraocular movements intact.     Pupils: Pupils are equal, round, and reactive to light.  Cardiovascular:     Rate and Rhythm: Normal rate and regular rhythm.     Pulses: Normal pulses.     Heart sounds: Normal heart sounds.  Pulmonary:     Effort: Pulmonary effort is normal. No respiratory distress.     Breath sounds: Normal breath sounds.  Abdominal:     General: Bowel sounds are normal. There is no distension.     Palpations: Abdomen is soft.     Tenderness: There is no abdominal tenderness.  Musculoskeletal:        General: No swelling or deformity.     Comments: Bilateral lower extremities neurovascularly intact  Skin:    General: Skin is warm and dry.  Neurological:     General: No focal deficit present.     Mental Status: Mental status is at baseline.    Labs on Admission: I have personally reviewed following labs and imaging studies  CBC: Recent Labs  Lab 07/29/22 1147 07/29/22 1154  WBC 7.4  --   HGB 13.4 13.6  HCT 40.2 40.0  MCV 95.0  --   PLT 194  --     Basic Metabolic Panel: Recent Labs  Lab 07/29/22 1147 07/29/22 1154  NA 137 138  K 4.4 4.5  CL 106 109  CO2 23  --   GLUCOSE 140* 140*  BUN 23 27*  CREATININE 1.49* 1.50*  CALCIUM 8.6*  --     GFR: Estimated Creatinine Clearance: 46 mL/min (A)  (by C-G formula based on SCr of 1.5 mg/dL (H)).  Liver Function Tests: Recent Labs  Lab 07/29/22 1147  AST 32  ALT 25  ALKPHOS 64  BILITOT 0.6  PROT 6.7  ALBUMIN 3.8    Urine analysis:    Component Value Date/Time   COLORURINE YELLOW 12/28/2015 0209   APPEARANCEUR CLEAR 12/28/2015 0209   LABSPEC 1.019 12/28/2015 0209   PHURINE 6.0 12/28/2015 0209   GLUCOSEU NEGATIVE 12/28/2015 0209   HGBUR NEGATIVE 12/28/2015 0209   BILIRUBINUR NEGATIVE 12/28/2015 0209   KETONESUR NEGATIVE 12/28/2015 0209   PROTEINUR NEGATIVE 12/28/2015 0209   NITRITE NEGATIVE 12/28/2015 0209   LEUKOCYTESUR NEGATIVE 12/28/2015 0209    Radiological Exams on Admission: CT Hip Left Wo Contrast  Result Date: 07/29/2022 CLINICAL DATA:  Femoral neck fracture, patient presents for further characterization. EXAM: CT OF THE LEFT HIP WITHOUT CONTRAST TECHNIQUE: Multidetector CT imaging of the left hip was performed according to the standard protocol. Multiplanar CT image reconstructions were also generated. RADIATION DOSE REDUCTION: This exam was performed according to the departmental dose-optimization program which includes automated exposure control, adjustment of the mA and/or kV according to patient size and/or use of iterative reconstruction technique. COMPARISON:  None Available. FINDINGS: Bones/Joint/Cartilage Comminuted and angulated left intertrochanteric fracture with an oblique fracture component extending into the proximal diaphysis with 2.9 cm of posteromedial displacement of the major fracture fragment. Oblique fracture cleft involving the subcapital left femoral neck. Moderate osteoarthritis of the left hip. No other acute fracture or dislocation. No fracture or dislocation. Normal alignment. No joint effusion. Ligaments Ligaments are suboptimally evaluated by CT.  Muscles and Tendons Muscles are normal. No muscle atrophy. No intramuscular fluid collection or hematoma. Soft tissue No fluid collection or hematoma.   No soft tissue mass. IMPRESSION: 1. Acute comminuted and angulated left intertrochanteric fracture with an oblique fracture component extending into the proximal diaphysis with 2.9 cm of posteromedial displacement of the major fracture fragment. 2. Acute subcapital left femoral neck fracture. Electronically Signed   By: Elige Ko M.D.   On: 07/29/2022 13:03   DG Pelvis Portable  Result Date: 07/29/2022 CLINICAL DATA:  Patient fell off a ladder. EXAM: PORTABLE PELVIS 1-2 VIEWS COMPARISON:  04/23/2018 FINDINGS: Severely comminuted left intertrochanteric femoral neck fracture identified with varus angulation. Fracture line extends into the proximal diaphysis. SI joints and symphysis pubis unremarkable. No evidence for pubic ramus fracture. IMPRESSION: Severely comminuted left intertrochanteric femoral neck fracture with varus angulation. Electronically Signed   By: Kennith Center M.D.   On: 07/29/2022 12:37   DG Femur Portable Min 2 Views Left  Result Date: 07/29/2022 CLINICAL DATA:  Patient fell off a ladder. EXAM: LEFT FEMUR PORTABLE 2 VIEWS COMPARISON:  None Available. FINDINGS: Severely comminuted intertrochanteric left femoral neck fracture with varus angulation. Fracture line extends into the proximal femoral diaphysis. Calcifications in the region of the popliteal fossa on the lateral film are probably loose bodies in a Baker's cyst. IMPRESSION: Severely comminuted intertrochanteric left femoral neck fracture with varus angulation. Electronically Signed   By: Kennith Center M.D.   On: 07/29/2022 12:30   DG Chest Port 1 View  Result Date: 07/29/2022 CLINICAL DATA:  Status post fall EXAM: PORTABLE CHEST 1 VIEW COMPARISON:  04/23/2018 FINDINGS: The heart size and mediastinal contours are within normal limits. Both lungs are clear. The visualized skeletal structures are unremarkable. IMPRESSION: No active disease. Electronically Signed   By: Elige Ko M.D.   On: 07/29/2022 12:25    EKG: Not yet  performed  Assessment/Plan Principal Problem:   Closed comminuted intertrochanteric fracture of left femur (HCC) Active Problems:   Hyperlipidemia with target LDL less than 70   Obstructive sleep apnea   Essential hypertension, benign   GERD   Coronary artery disease involving native coronary artery of native heart without angina pectoris   BPH with obstruction/lower urinary tract symptoms   Peripheral neuropathy   Stage 3a chronic kidney disease (HCC)   Left femur fracture > Patient had a fall from ladder onto concrete of the fourth rung.  Unable to walk or stand afterwards. > Imaging confirmed comminuted intratrochanteric femur neck fracture on the left along with subcapital femur fracture. > Orthopedics consulted plan for surgical invention either today or tomorrow depending on complexity based on CT result. - Monitor on telemetry overnight - Appreciate orthopedics recommendations - As needed pain control with Dilaudid - Add PRN robaxin for muscle spasms - Consult to anesthesia for nerve block - Traction - Supportive care - N.p.o. for now - SCDs  CKD 3 > Creatinine stable 1.49 in the ED. - Trend renal function and electrolytes  Hypertension - Continue home losartan  Hyperlipidemia - Continue rosuvastatin  GERD - Continue home PPI  OSA - Continue home CPAP  BPH - Continue home tamsulosin  CAD - Continue home losartan, rosuvastatin  History of splenic and renal aneurysm - Noted  DVT prophylaxis: SCDs Code Status:   Full Family Communication:  Updated at bedside  Disposition Plan:   Patient is from:  Home  Anticipated DC to:  Home  Anticipated DC date:  1 to 3  days  Anticipated DC barriers: None  Consults called:  Orthopedic surgery Admission status:  Observation, telemetry  Severity of Illness: The appropriate patient status for this patient is OBSERVATION. Observation status is judged to be reasonable and necessary in order to provide the required  intensity of service to ensure the patient's safety. The patient's presenting symptoms, physical exam findings, and initial radiographic and laboratory data in the context of their medical condition is felt to place them at decreased risk for further clinical deterioration. Furthermore, it is anticipated that the patient will be medically stable for discharge from the hospital within 2 midnights of admission.    Synetta Fail MD [facility]  How to contact the [facility] Attending or Consulting provider 7A - 7P or covering provider during after hours 7P -7A, for this patient?   Check the care team in [facility] and look for a) attending/consulting [facility] provider listed and b) the [facility] team listed Log into [email or web address] and use [place]'s universal password to access. If you do not have the password, please contact the hospital operator. Locate the [facility] provider you are looking for under [facility] and page to a number that you can be directly reached. If you still have difficulty reaching the provider, please page the [facility] (Director on Call) for the Hospitalists listed on amion for assistance.  07/29/2022, 1:15 PM

## 2022-07-29 NOTE — Anesthesia Postprocedure Evaluation (Signed)
Anesthesia Post Note  Patient: Randall Reeves  Procedure(s) Performed: INTRAMEDULLARY (IM) NAIL INTERTROCHANTERIC (Left: Leg Upper)     Patient location during evaluation: PACU Anesthesia Type: General Level of consciousness: awake and alert Pain management: pain level controlled Vital Signs Assessment: post-procedure vital signs reviewed and stable Respiratory status: spontaneous breathing, nonlabored ventilation and respiratory function stable Cardiovascular status: blood pressure returned to baseline and stable Postop Assessment: no apparent nausea or vomiting Anesthetic complications: no  No notable events documented.  Last Vitals:  Vitals:   07/29/22 1930 07/29/22 1947  BP: 131/84 133/77  Pulse: 92 96  Resp: 14 18  Temp: (!) 36.4 C 36.4 C  SpO2: 97% 99%    Last Pain:  Vitals:   07/29/22 1947  TempSrc: Oral  PainSc:                  [name],W. EDMOND

## 2022-07-29 NOTE — Progress Notes (Signed)
Orthopedic Tech Progress Note Patient Details:  Randall Reeves [birth year] [id number]  Musculoskeletal Traction Type of Traction: [place]ucks Skin Traction Traction Location: LLE Traction Weight: 15 lbs   Post Interventions Patient Tolerated: Well  Blaike Newburn A Dai Mcadams 07/29/2022, 3:27 PM

## 2022-07-29 NOTE — Anesthesia Preprocedure Evaluation (Addendum)
Anesthesia Evaluation  Patient identified by MRN, date of birth, ID band Patient awake    Reviewed: Allergy & Precautions, H&P , NPO status , Patient's Chart, lab work & pertinent test results  Airway Mallampati: II  TM Distance: >3 FB Neck ROM: Full    Dental no notable dental hx. (+) Teeth Intact, Dental Advisory Given   Pulmonary sleep apnea and Continuous Positive Airway Pressure Ventilation    Pulmonary exam normal breath sounds clear to auscultation       Cardiovascular hypertension, Pt. on medications + Past MI   Rhythm:Regular Rate:Normal     Neuro/Psych negative neurological ROS  negative psych ROS   GI/Hepatic Neg liver ROS,GERD  Medicated,,  Endo/Other  negative endocrine ROS    Renal/GU Renal InsufficiencyRenal disease  negative genitourinary   Musculoskeletal  (+) Arthritis , Osteoarthritis,    Abdominal   Peds  Hematology negative hematology ROS (+)   Anesthesia Other Findings   Reproductive/Obstetrics negative OB ROS                             Anesthesia Physical Anesthesia Plan  ASA: 3  Anesthesia Plan: General   Post-op Pain Management: Ofirmev IV (intra-op)*   Induction: Intravenous  PONV Risk Score and Plan: 3 and Ondansetron, Dexamethasone and Treatment Griffiths vary due to age or medical condition  Airway Management Planned: Oral ETT  Additional Equipment:   Intra-op Plan:   Post-operative Plan: Extubation in OR  Informed Consent: I have reviewed the patients History and Physical, chart, labs and discussed the procedure including the risks, benefits and alternatives for the proposed anesthesia with the patient or authorized representative who has indicated his/her understanding and acceptance.     Dental advisory given  Plan Discussed with: CRNA  Anesthesia Plan Comments:        Anesthesia Quick Evaluation

## 2022-07-29 NOTE — ED Notes (Signed)
Taken to CT and back to room by this RN. Switched to a regular hospital bed. Ortho tech is aware. Will continue to monitor.

## 2022-07-29 NOTE — ED Notes (Signed)
ED TO INPATIENT HANDOFF REPORT  ED Nurse Name and Phone #: Sheilah Mins [phone]  S Name/Age/Gender Randall Reeves 76 y.o. male Room/Bed: RESUSC/RESUSC  Code Status   Code Status: Full Code  Home/SNF/Other Home Patient oriented to: self, place, time, and situation Is this baseline? Yes   Triage Complete: Triage complete  Chief Complaint Closed comminuted intertrochanteric fracture of left femur (HCC) [S72.142A]  Triage Note Mechanical fall. 8 ft ladder. No LOC. Not on blood thinners. Alert and oriented x 4. EMS gave Fentanyl and 4mg  Zofran. Obvious deformity to left hip/femur area.    Allergies Allergies  Allergen Reactions   Bisoprolol-Hydrochlorothiazide     REACTION: muscle pain, cramps   Codeine Nausea Only   Imipramine Other (See Comments)    Dry mouth, dizziness, headaches, increased sweating    Tamsulosin Other (See Comments)    Dizziness     Level of Care/Admitting Diagnosis ED Disposition     ED Disposition  Admit   Condition  --   Comment  Hospital Area: [facility] [[id number]]  Level of Care: Telemetry Surgical [[id number]]  [place] place patient in observation at [facility] or [facility] if equivalent level of care is available:: No  Covid Evaluation: Asymptomatic - no recent exposure (last 10 days) testing not required  Diagnosis: Closed comminuted intertrochanteric fracture of left femur [facility]) [[id number]]  Admitting Physician: Synetta Fail [[id number]]  Attending Physician: Synetta Fail [[id number]]          B Medical/Surgery History Past Medical History:  Diagnosis Date   Allergy    Arthritis    Cataract    CKD (chronic kidney disease), stage III (HCC)    Coronary artery disease, non-occlusive 09/2009   Minimal CAD / no flow-limiting lesions on Cath; CPX February 2022: 1200 exercise, reached 91% image PRN.  Peak VO2 26.5-normal= excellent functional capacity.  High anaerobic threshold suggest excellent trending effect.  At  peak exercise, limited by obesity.:  Normal CPX   GERD (gastroesophageal reflux disease)    Hyperlipidemia    Hypertension    Myocardial infarction - Type 2a MI 2015    Felt to be Type II MI-demand infarct   Neuromuscular disorder (HCC)    Obstructive sleep apnea    Cpap as of 27 days ago   Sleep apnea    wears cpap    Syncope    due to hypotension from BP meds    Past Surgical History:  Procedure Laterality Date   CARDIOPULMONARY STRESS TEST  03/17/2020   PreEx PFTs: FVC 4.33 (105%), FEV1 3.23 (104%) FEV1/FVC 75 (98% ) MVV 90 (73%): NORMAL; Exercise 12 min;hip and knee fatigue and mild dyspnea.  Rest HR 80 bpm-> max HR 135 bpm (91% of MPHR); BP 124/76 up to 166/74 mmHg; good exercise effort. Peak VO2 26.5 (108% predicted); VE/VCO2 slope 30.  Peak RER 1.1, ventilator threshold 20.6 (84% predicted, 78% measured peak VO2).  Pulse ox remained 94 to 99%   CARPAL TUNNEL RELEASE Left 02/2013   COLONOSCOPY     INGUINAL HERNIA REPAIR Left 1983   with vasectomy   LEFT HEART CATH AND CORONARY ANGIOGRAPHY  10/20/2009   (In the setting of MI with trivial troponin elevation). LM normal --<LAD, LCx. LAD (2 small Diags) - no significant disease, LCx (co-dom with PDA) & 2 OMs (OM2 moderate size with faint "hazy" defect - not flow limiting. Co-dom RCA with minor irregularities.  EF ~60%. ? Mild Inf HK. --> Med Rx.  Felt to be Type II MI-demand infarct   POLYPECTOMY     SPERMATOCELECTOMY  1998   TRANSTHORACIC ECHOCARDIOGRAM  10/2012; 12/2015   a) EF 55 to 60%.  GR 1 DD.  Normal motion.  Normal valves.;; b) Normal LV size and function.  EF 55 to 60%.  GR 2 DD.  Normal valves.   VASECTOMY  1983   with inguinal hernia repair     A IV Location/Drains/Wounds Patient Lines/Drains/Airways Status     Active Line/Drains/Airways     Name Placement date Placement time Site Days   Peripheral IV 07/29/22 18 G Left Antecubital 07/29/22  1143  Antecubital  less than 1   Peripheral IV 07/29/22 16 G  Anterior;Distal;Right;Upper Antecubital 07/29/22  1143  Antecubital  less than 1   Wound / Incision (Open or Dehisced) 04/23/18 Laceration Head Posterior 04/23/18  0500  Head  1558            Intake/Output Last 24 hours No intake or output data in the 24 hours ending 07/29/22 1346  Labs/Imaging Results for orders placed or performed during the hospital encounter of 07/29/22 (from the past 48 hour(s))  Ethanol     Status: None   Collection Time: 07/29/22 11:42 AM  Result Value Ref Range   Alcohol, Ethyl (B) <10 <10 mg/dL    Comment: (NOTE) Lowest detectable limit for serum alcohol is 10 mg/dL.  For medical purposes only. Performed at [facility], [address], [place], [place] [address]   Comprehensive metabolic panel     Status: Abnormal   Collection Time: 07/29/22 11:47 AM  Result Value Ref Range   Sodium 137 135 - 145 mmol/L   Potassium 4.4 3.5 - 5.1 mmol/L    Comment: HEMOLYSIS AT THIS LEVEL [place] AFFECT RESULT   Chloride 106 98 - 111 mmol/L   CO2 23 22 - 32 mmol/L   Glucose, Bld 140 (H) 70 - 99 mg/dL    Comment: Glucose reference range applies only to samples taken after fasting for at least 8 hours.   BUN 23 8 - 23 mg/dL   Creatinine, Ser 6.04 (H) 0.61 - 1.24 mg/dL   Calcium 8.6 (L) 8.9 - 10.3 mg/dL   Total Protein 6.7 6.5 - 8.1 g/dL   Albumin 3.8 3.5 - 5.0 g/dL   AST 32 15 - 41 U/L    Comment: HEMOLYSIS AT THIS LEVEL [place] AFFECT RESULT   ALT 25 0 - 44 U/L    Comment: HEMOLYSIS AT THIS LEVEL [place] AFFECT RESULT   Alkaline Phosphatase 64 38 - 126 U/L   Total Bilirubin 0.6 0.3 - 1.2 mg/dL    Comment: HEMOLYSIS AT THIS LEVEL [place] AFFECT RESULT   GFR, Estimated 49 (L) >60 mL/min    Comment: (NOTE) Calculated using the CKD-EPI Creatinine Equation (2021)    Anion gap 8 5 - 15    Comment: Performed at [facility], [address], [place], [place] [address]  CBC     Status: None   Collection Time: 07/29/22 11:47 AM  Result Value Ref Range   WBC 7.4 4.0 -  10.5 K/uL   RBC 4.23 4.22 - 5.81 MIL/uL   Hemoglobin 13.4 13.0 - 17.0 g/dL   HCT 11.9 14.7 - 82.9 %   MCV 95.0 80.0 - 100.0 fL   MCH 31.7 26.0 - 34.0 pg   MCHC 33.3 30.0 - 36.0 g/dL   RDW 56.2 13.0 - 86.5 %   Platelets 194  150 - 400 K/uL   nRBC 0.0 0.0 - 0.2 %    Comment: Performed at [facility], [address], [place], [place] [address]  Protime-INR     Status: None   Collection Time: 07/29/22 11:47 AM  Result Value Ref Range   Prothrombin Time 13.3 11.4 - 15.2 seconds   INR 1.0 0.8 - 1.2    Comment: (NOTE) INR goal varies based on device and disease states. Performed at [facility], [address] [place], [place] [address]   I-Stat Chem 8, ED     Status: Abnormal   Collection Time: 07/29/22 11:54 AM  Result Value Ref Range   Sodium 138 135 - 145 mmol/L   Potassium 4.5 3.5 - 5.1 mmol/L   Chloride 109 98 - 111 mmol/L   BUN 27 (H) 8 - 23 mg/dL   Creatinine, Ser 0.98 (H) 0.61 - 1.24 mg/dL   Glucose, Bld 119 (H) 70 - 99 mg/dL    Comment: Glucose reference range applies only to samples taken after fasting for at least 8 hours.   Calcium, Ion 1.00 (L) 1.15 - 1.40 mmol/L   TCO2 23 22 - 32 mmol/L   Hemoglobin 13.6 13.0 - 17.0 g/dL   HCT 14.7 82.9 - 56.2 %  Lactic acid, plasma     Status: None   Collection Time: 07/29/22 12:00 PM  Result Value Ref Range   Lactic Acid, Venous 1.3 0.5 - 1.9 mmol/L    Comment: Performed at [facility], [address] [place], [place] [address]  Sample to Blood Bank     Status: None   Collection Time: 07/29/22 12:00 PM  Result Value Ref Range   Blood Bank Specimen SAMPLE AVAILABLE FOR TESTING    Sample Expiration      08/01/2022,2359 Performed at [facility], [address], [place], [place] [address]    CT Hip Left Wo Contrast  Result Date: 07/29/2022 CLINICAL DATA:  Femoral neck fracture, patient presents for further characterization. EXAM: CT OF THE LEFT HIP WITHOUT CONTRAST TECHNIQUE: Multidetector CT imaging  of the left hip was performed according to the standard protocol. Multiplanar CT image reconstructions were also generated. RADIATION DOSE REDUCTION: This exam was performed according to the departmental dose-optimization program which includes automated exposure control, adjustment of the Victoriano Campion and/or kV according to patient size and/or use of iterative reconstruction technique. COMPARISON:  None Available. FINDINGS: Bones/Joint/Cartilage Comminuted and angulated left intertrochanteric fracture with an oblique fracture component extending into the proximal diaphysis with 2.9 cm of posteromedial displacement of the major fracture fragment. Oblique fracture cleft involving the subcapital left femoral neck. Moderate osteoarthritis of the left hip. No other acute fracture or dislocation. No fracture or dislocation. Normal alignment. No joint effusion. Ligaments Ligaments are suboptimally evaluated by CT. Muscles and Tendons Muscles are normal. No muscle atrophy. No intramuscular fluid collection or hematoma. Soft tissue No fluid collection or hematoma.  No soft tissue mass. IMPRESSION: 1. Acute comminuted and angulated left intertrochanteric fracture with an oblique fracture component extending into the proximal diaphysis with 2.9 cm of posteromedial displacement of the major fracture fragment. 2. Acute subcapital left femoral neck fracture. Electronically Signed   By: Elige Ko M.D.   On: 07/29/2022 13:03   DG Pelvis Portable  Result Date: 07/29/2022 CLINICAL DATA:  Patient fell off a ladder. EXAM: PORTABLE PELVIS 1-2 VIEWS COMPARISON:  04/23/2018 FINDINGS: Severely comminuted left intertrochanteric femoral neck fracture identified with varus angulation.  Fracture line extends into the proximal diaphysis. SI joints and symphysis pubis unremarkable. No evidence for pubic ramus fracture. IMPRESSION: Severely comminuted left intertrochanteric femoral neck fracture with varus angulation. Electronically Signed   By: Kennith Center M.D.   On: 07/29/2022 12:37   DG Femur Portable Min 2 Views Left  Result Date: 07/29/2022 CLINICAL DATA:  Patient fell off a ladder. EXAM: LEFT FEMUR PORTABLE 2 VIEWS COMPARISON:  None Available. FINDINGS: Severely comminuted intertrochanteric left femoral neck fracture with varus angulation. Fracture line extends into the proximal femoral diaphysis. Calcifications in the region of the popliteal fossa on the lateral film are probably loose bodies in a Baker's cyst. IMPRESSION: Severely comminuted intertrochanteric left femoral neck fracture with varus angulation. Electronically Signed   By: Kennith Center M.D.   On: 07/29/2022 12:30   DG Chest Port 1 View  Result Date: 07/29/2022 CLINICAL DATA:  Status post fall EXAM: PORTABLE CHEST 1 VIEW COMPARISON:  04/23/2018 FINDINGS: The heart size and mediastinal contours are within normal limits. Both lungs are clear. The visualized skeletal structures are unremarkable. IMPRESSION: No active disease. Electronically Signed   By: Elige Ko M.D.   On: 07/29/2022 12:25    Pending Labs Unresulted Labs (From admission, onward)     Start     Ordered   07/30/22 0500  CBC  Tomorrow morning,   R        07/29/22 1314   07/30/22 0500  Basic metabolic panel  Tomorrow morning,   R        07/29/22 1314   07/29/22 1145  Urinalysis, Routine w reflex microscopic -Urine, Clean Catch  (Trauma Panel)  Once,   URGENT       Question:  Specimen Source  Answer:  Urine, Clean Catch   07/29/22 1145            Vitals/Pain Today's Vitals   07/29/22 1230 07/29/22 1245 07/29/22 1300 07/29/22 1313  BP: 139/86 132/71 139/85   Pulse: 77 81 83   Resp: 15 14 12    Temp:      TempSrc:      SpO2: 99% 100% 100%   Weight:      Height:      PainSc:    7     Isolation Precautions No active isolations  Medications Medications  HYDROmorphone (DILAUDID) injection 1 mg (1 mg Intravenous Given 07/29/22 1203)  losartan (COZAAR) tablet 50 mg (has no administration in  time range)  rosuvastatin (CRESTOR) tablet 10 mg (has no administration in time range)  pantoprazole (PROTONIX) EC tablet 40 mg (has no administration in time range)  tamsulosin (FLOMAX) capsule 0.4 mg (has no administration in time range)  polyethylene glycol (MIRALAX / GLYCOLAX) packet 17 g (has no administration in time range)  HYDROmorphone (DILAUDID) injection 1 mg (1 mg Intravenous Given 07/29/22 1313)    Mobility walks     Focused Assessments Neuro Assessment Handoff:  Swallow screen pass? Yes  Cardiac Rhythm: Normal sinus rhythm       Neuro Assessment: Exceptions to WDL Neuro Checks:      Has TPA been given? No If patient is a Neuro Trauma and patient is going to OR before floor call report to 4N Charge nurse: [phone] or [phone]   R Recommendations: See Admitting Provider Note  Report given to:   Additional Notes:

## 2022-07-29 NOTE — Op Note (Signed)
Date of Surgery: 07/29/2022  INDICATIONS: Mr. Randall Reeves is a 77 y.o.-year-old male who sustained a left hip fracture.  This was a very complicated fracture with a peritrochanteric fracture with a large lesser trochanteric split piece as well as a oblique femoral neck component.  He fell from a ladder earlier this morning.  The risks and benefits of the procedure discussed with the patient prior to the procedure and all questions were answered; consent was obtained.  PREOPERATIVE DIAGNOSIS: left hip fracture   POSTOPERATIVE DIAGNOSIS:  Left hip closed femoral neck fracture Left hip closed peritrochanteric fracture with displacement  PROCEDURE: Treatment of intertrochanteric, pertrochanteric, subtrochanteric fracture with intramedullary implant. CPT [phone]   SURGEON: Kathi Der. Aundria Rud, M.D.   Assistant: Dion Saucier, PA-C  Assistant attestation:  PA Mcclung was present for the entire procedure.  ANESTHESIA: general   IV FLUIDS AND URINE: See anesthesia record   ESTIMATED BLOOD LOSS: 150 cc  IMPLANTS: Arthrex cephalomedullary nail size 10 x 390 mm Proximal compression screw length 105 mm Distal interlock screw 5.0 x 40 mm  DRAINS: None.   COMPLICATIONS: None.   DESCRIPTION OF PROCEDURE: The patient was brought to the operating room and placed supine on the operating table. The patient's leg had been signed prior to the procedure. The patient had the anesthesia placed by the anesthesiologist. The prep verification and incision time-outs were performed to confirm that this was the correct patient, site, side and location. The patient had an SCD on the opposite lower extremity. The patient did receive antibiotics prior to the incision and was re-dosed during the procedure as needed at indicated intervals. The patient was positioned on the fracture table with the table in traction and internal rotation to reduce the hip. The well leg was placed in a scissor position and all bony prominences  were well-padded. The patient had the lower extremity prepped and draped in the standard surgical fashion. The incision was made 4 finger breadths superior to the greater trochanter. A guide pin was inserted into the tip of the greater trochanter under fluoroscopic guidance. An opening reamer was used to gain access to the femoral canal. The nail length was measured and inserted down the femoral canal to its proper depth. The appropriate version of insertion for the lag screw was found under fluoroscopy. A pin was inserted up the femoral neck through the jig. The length of the lag screw was then measured. The lag screw was inserted as near to center-center in the head as possible. The leg was taken out of traction, then the compression screw was used to compress across the fracture. Compression was visualized on serial xrays.   Of note, we did use an antirotation pin for this left hip fracture to ensure no femoral head rotation while putting the compression screw across the fracture sites.  We also did note that with an extra femoral neck screw to address the femoral neck fracture separately it did call some more displacement through the femoral neck and thus we did not want that to be the case.  We then removed the antirotation screw and use just the pin while instrumenting the femoral neck.  We next turned our attention to the distal interlocking screw.  Perfect circles were utilized to place a static distal interlocking screw through the nail.  A small incision was made overlying the lateral thigh at the screw site, and a tonsil was used to disect down to bone.  A drill pass was made through both  cortices.  This was measured, and the appropriate screw was placed under hand power and found to have good bite.  We confirmed that this was in the nail on both AP and lateral views.  The wound was copiously irrigated with saline and the subcutaneous layer closed with 2.0 vicryl and the skin was reapproximated with  staples. The wounds were cleaned and dried a final time and a sterile dressing was placed. The hip was taken through a range of motion at the end of the case under fluoroscopic imaging to visualize the approach-withdraw phenomenon and confirm implant length in the head. The patient was then awakened from anesthesia and taken to the recovery room in stable condition. All counts were correct at the end of the case.   POSTOPERATIVE PLAN: The patient will be up to 50% weight bearing as tolerated and will return in 2 weeks for staple removal and the patient will receive DVT prophylaxis based on other medications, activity level, and risk ratio of bleeding to thrombosis.     Maryan Rued, MD [facility] [place] [phone] 6:17 PM

## 2022-07-30 DIAGNOSIS — R102 Pelvic and perineal pain: Secondary | ICD-10-CM | POA: Diagnosis not present

## 2022-07-30 DIAGNOSIS — I252 Old myocardial infarction: Secondary | ICD-10-CM | POA: Diagnosis not present

## 2022-07-30 DIAGNOSIS — Z8249 Family history of ischemic heart disease and other diseases of the circulatory system: Secondary | ICD-10-CM | POA: Diagnosis not present

## 2022-07-30 DIAGNOSIS — S72012A Unspecified intracapsular fracture of left femur, initial encounter for closed fracture: Secondary | ICD-10-CM | POA: Diagnosis not present

## 2022-07-30 DIAGNOSIS — S72002A Fracture of unspecified part of neck of left femur, initial encounter for closed fracture: Secondary | ICD-10-CM | POA: Diagnosis present

## 2022-07-30 DIAGNOSIS — Y92009 Unspecified place in unspecified non-institutional (private) residence as the place of occurrence of the external cause: Secondary | ICD-10-CM | POA: Diagnosis not present

## 2022-07-30 DIAGNOSIS — K5900 Constipation, unspecified: Secondary | ICD-10-CM | POA: Diagnosis not present

## 2022-07-30 DIAGNOSIS — S72142D Displaced intertrochanteric fracture of left femur, subsequent encounter for closed fracture with routine healing: Secondary | ICD-10-CM | POA: Diagnosis not present

## 2022-07-30 DIAGNOSIS — D649 Anemia, unspecified: Secondary | ICD-10-CM | POA: Diagnosis not present

## 2022-07-30 DIAGNOSIS — D62 Acute posthemorrhagic anemia: Secondary | ICD-10-CM | POA: Diagnosis not present

## 2022-07-30 DIAGNOSIS — M1612 Unilateral primary osteoarthritis, left hip: Secondary | ICD-10-CM | POA: Diagnosis not present

## 2022-07-30 DIAGNOSIS — Z9181 History of falling: Secondary | ICD-10-CM | POA: Diagnosis not present

## 2022-07-30 DIAGNOSIS — I10 Essential (primary) hypertension: Secondary | ICD-10-CM | POA: Diagnosis not present

## 2022-07-30 DIAGNOSIS — W11XXXA Fall on and from ladder, initial encounter: Secondary | ICD-10-CM | POA: Diagnosis present

## 2022-07-30 DIAGNOSIS — S72102D Unspecified trochanteric fracture of left femur, subsequent encounter for closed fracture with routine healing: Secondary | ICD-10-CM | POA: Diagnosis not present

## 2022-07-30 DIAGNOSIS — Z825 Family history of asthma and other chronic lower respiratory diseases: Secondary | ICD-10-CM | POA: Diagnosis not present

## 2022-07-30 DIAGNOSIS — I480 Paroxysmal atrial fibrillation: Secondary | ICD-10-CM | POA: Diagnosis not present

## 2022-07-30 DIAGNOSIS — R2681 Unsteadiness on feet: Secondary | ICD-10-CM | POA: Diagnosis not present

## 2022-07-30 DIAGNOSIS — S72012D Unspecified intracapsular fracture of left femur, subsequent encounter for closed fracture with routine healing: Secondary | ICD-10-CM | POA: Diagnosis not present

## 2022-07-30 DIAGNOSIS — Z8042 Family history of malignant neoplasm of prostate: Secondary | ICD-10-CM | POA: Diagnosis not present

## 2022-07-30 DIAGNOSIS — S72009A Fracture of unspecified part of neck of unspecified femur, initial encounter for closed fracture: Secondary | ICD-10-CM | POA: Diagnosis present

## 2022-07-30 DIAGNOSIS — I4892 Unspecified atrial flutter: Secondary | ICD-10-CM | POA: Diagnosis not present

## 2022-07-30 DIAGNOSIS — Z823 Family history of stroke: Secondary | ICD-10-CM | POA: Diagnosis not present

## 2022-07-30 DIAGNOSIS — I4589 Other specified conduction disorders: Secondary | ICD-10-CM | POA: Diagnosis not present

## 2022-07-30 DIAGNOSIS — I2510 Atherosclerotic heart disease of native coronary artery without angina pectoris: Secondary | ICD-10-CM | POA: Diagnosis not present

## 2022-07-30 DIAGNOSIS — N1831 Chronic kidney disease, stage 3a: Secondary | ICD-10-CM | POA: Diagnosis not present

## 2022-07-30 DIAGNOSIS — Z7401 Bed confinement status: Secondary | ICD-10-CM | POA: Diagnosis not present

## 2022-07-30 DIAGNOSIS — M6281 Muscle weakness (generalized): Secondary | ICD-10-CM | POA: Diagnosis not present

## 2022-07-30 DIAGNOSIS — R2689 Other abnormalities of gait and mobility: Secondary | ICD-10-CM | POA: Diagnosis not present

## 2022-07-30 DIAGNOSIS — N401 Enlarged prostate with lower urinary tract symptoms: Secondary | ICD-10-CM | POA: Diagnosis not present

## 2022-07-30 DIAGNOSIS — E785 Hyperlipidemia, unspecified: Secondary | ICD-10-CM | POA: Diagnosis not present

## 2022-07-30 DIAGNOSIS — N138 Other obstructive and reflux uropathy: Secondary | ICD-10-CM | POA: Diagnosis not present

## 2022-07-30 DIAGNOSIS — R000 Tachycardia, unspecified: Secondary | ICD-10-CM | POA: Diagnosis not present

## 2022-07-30 DIAGNOSIS — N400 Enlarged prostate without lower urinary tract symptoms: Secondary | ICD-10-CM | POA: Diagnosis not present

## 2022-07-30 DIAGNOSIS — I129 Hypertensive chronic kidney disease with stage 1 through stage 4 chronic kidney disease, or unspecified chronic kidney disease: Secondary | ICD-10-CM | POA: Diagnosis not present

## 2022-07-30 DIAGNOSIS — G4733 Obstructive sleep apnea (adult) (pediatric): Secondary | ICD-10-CM | POA: Diagnosis not present

## 2022-07-30 DIAGNOSIS — Z833 Family history of diabetes mellitus: Secondary | ICD-10-CM | POA: Diagnosis not present

## 2022-07-30 DIAGNOSIS — G629 Polyneuropathy, unspecified: Secondary | ICD-10-CM | POA: Diagnosis not present

## 2022-07-30 DIAGNOSIS — S72142A Displaced intertrochanteric fracture of left femur, initial encounter for closed fracture: Secondary | ICD-10-CM | POA: Diagnosis not present

## 2022-07-30 DIAGNOSIS — D72829 Elevated white blood cell count, unspecified: Secondary | ICD-10-CM | POA: Diagnosis not present

## 2022-07-30 DIAGNOSIS — K219 Gastro-esophageal reflux disease without esophagitis: Secondary | ICD-10-CM | POA: Diagnosis not present

## 2022-07-30 LAB — BASIC METABOLIC PANEL
Anion gap: 10 (ref 5–15)
BUN: 24 mg/dL — ABNORMAL HIGH (ref 8–23)
CO2: 21 mmol/L — ABNORMAL LOW (ref 22–32)
Calcium: 8.1 mg/dL — ABNORMAL LOW (ref 8.9–10.3)
Chloride: 104 mmol/L (ref 98–111)
Creatinine, Ser: 1.5 mg/dL — ABNORMAL HIGH (ref 0.61–1.24)
GFR, Estimated: 49 mL/min — ABNORMAL LOW (ref >60)
Glucose, Bld: 185 mg/dL — ABNORMAL HIGH (ref 70–99)
Potassium: 4 mmol/L (ref 3.5–5.1)
Sodium: 135 mmol/L (ref 135–145)

## 2022-07-30 LAB — CBC
HCT: 32.4 % — ABNORMAL LOW (ref 39.0–52.0)
Hemoglobin: 10.8 g/dL — ABNORMAL LOW (ref 13.0–17.0)
MCH: 31.7 pg (ref 26.0–34.0)
MCHC: 33.3 g/dL (ref 30.0–36.0)
MCV: 95 fL (ref 80.0–100.0)
Platelets: 183 10*3/uL (ref 150–400)
RBC: 3.41 MIL/uL — ABNORMAL LOW (ref 4.22–5.81)
RDW: 12.6 % (ref 11.5–15.5)
WBC: 13.4 10*3/uL — ABNORMAL HIGH (ref 4.0–10.5)
nRBC: 0 % (ref 0.0–0.2)

## 2022-07-30 MED ORDER — LACTATED RINGERS IV BOLUS
1000.0000 mL | Freq: Once | INTRAVENOUS | Status: AC
  Administered 2022-07-30: 1000 mL via INTRAVENOUS

## 2022-07-30 MED ORDER — ACETAMINOPHEN 325 MG PO TABS
650.0000 mg | ORAL_TABLET | ORAL | Status: DC | PRN
  Administered 2022-07-30 – 2022-08-03 (×6): 650 mg via ORAL
  Filled 2022-07-30 (×8): qty 2

## 2022-07-30 MED ORDER — SODIUM CHLORIDE 0.9 % IV SOLN: INTRAVENOUS | Status: DC

## 2022-07-30 MED ORDER — TRAMADOL HCL 50 MG PO TABS
50.0000 mg | ORAL_TABLET | Freq: Two times a day (BID) | ORAL | Status: DC | PRN
  Administered 2022-07-30 – 2022-08-03 (×4): 50 mg via ORAL
  Filled 2022-07-30 (×4): qty 1

## 2022-07-30 NOTE — Progress Notes (Signed)
[facility]                                                                               Randall Reeves, is a 77 y.o. male, DOB - 11-18-1945, OZH:[id number] Admit date - 07/29/2022    Outpatient Primary MD for the patient is Randall Schwalbe, MD  LOS - 0  days    Brief summary   Randall Reeves is a 76 y.o. male with medical history significant of CKD 3A, neuropathy, hypertension, hyperlipidemia, GERD, OSA, CAD, BPH, splenic and renal artery aneurysms presenting after a fall at home.  CT of the hip confirmed femur head fracture and also subcapital fracture.  Orthopedic consulted and pt underwent intertrochanteric intramedullary nail placement on 7.4/24.   Assessment & Plan   Left femur fracture > Patient had a fall from ladder onto concrete of the fourth rung.  Unable to walk or stand afterwards. > Imaging confirmed comminuted intratrochanteric femur neck fracture on the left along with subcapital femur fracture. > Orthopedics consulted  and patient underwent intertrochanteric intramedullary nail placement.   Pain control and therapy evaluations are pending.    CKD 3 Creatinine at baseline.    Hypertension BP parameters post op are borderline low . RL fluid bolus ordered and d/c losartan. Will start patient on maintenance fluids.   Leukocytosis:  Suspect reactive.  No fever or chills. Get UA.  If continues to worsen, check CXR.    Anemia of blood loss from surgery :  Baseline hemoglobin appears to be around 12, dropped to 10 today.  Continue to monitor.    Hyperlipidemia - Continue rosuvastatin   GERD - Continue home PPI   OSA - Continue home CPAP   BPH Hold flomax for hypotension.     CAD    History of splenic and renal aneurysm - Noted    Estimated body mass index is 28.73 kg/m as calculated from the following:   Height as of this encounter: 5\' 8"  (1.727 m).   Weight as of this encounter: 85.7 kg.  Code Status: none at bedside.  DVT  Prophylaxis:  enoxaparin (LOVENOX) injection 40 mg Start: 07/30/22 0800 SCDs Start: 07/29/22 1948 SCDs Start: 07/29/22 1312   Level of Care: Level of care: Med-Surg Family Communication: family at bedside.   Disposition Plan:     Remains inpatient appropriate:  pending.   Procedures:  Intramedullary nail placement.   Consultants:   Orthopedics.   Antimicrobials:   Anti-infectives (From admission, onward)    Start     Dose/Rate Route Frequency Ordered Stop   07/29/22 2300  ceFAZolin (ANCEF) IVPB 2g/100 mL premix        2 g 200 mL/hr over 30 Minutes Intravenous Every 6 hours 07/29/22 1947 07/30/22 0640   07/29/22 1642  ceFAZolin (ANCEF) 2-4 GM/100ML-% IVPB       Note to Pharmacy: Rosiland Oz V: cabinet override      07/29/22 1642 07/30/22 0345        Medications  Scheduled Meds:  docusate sodium  100 mg Oral BID   enoxaparin (LOVENOX) injection  40 mg Subcutaneous Q24H   pantoprazole  40 mg Oral Daily  rosuvastatin  10 mg Oral Daily   Continuous Infusions:  methocarbamol (ROBAXIN) IV     PRN Meds:.acetaminophen, HYDROmorphone (DILAUDID) injection, menthol-cetylpyridinium **OR** phenol, methocarbamol (ROBAXIN) IV, metoCLOPramide **OR** metoCLOPramide (REGLAN) injection, ondansetron **OR** ondansetron (ZOFRAN) IV, polyethylene glycol, traMADol    Subjective:   [name] was seen and examined today.  Dizziness improved after IV fluids. Pain when working with PT.   Objective:   Vitals:   07/30/22 0730 07/30/22 0731 07/30/22 0732 07/30/22 0733  BP:   (!) 78/51 (!) 82/54  Pulse: 99 98 (!) 102 100  Resp: 20 18 20 16   Temp:      TempSrc:      SpO2: 94% 93% 94% 96%  Weight:      Height:        Intake/Output Summary (Last 24 hours) at 07/30/2022 1101 Last data filed at 07/30/2022 0900 Gross per 24 hour  Intake 2020 ml  Output 750 ml  Net 1270 ml   Filed Weights   07/29/22 1150  Weight: 85.7 kg     Exam General exam: Appears calm and comfortable   Respiratory system: Clear to auscultation. Respiratory effort normal. Cardiovascular system: S1 & S2 heard, RRR. No JVD,  Gastrointestinal system: Abdomen is nondistended, soft and nontender. Central nervous system: Alert and oriented. No focal neurological deficits. Extremities: painful ROM of the left lower extremity . Skin: No rashes,  Psychiatry: Mood & affect appropriate.    Data Reviewed:  I have personally reviewed following labs and imaging studies   CBC Lab Results  Component Value Date   WBC 13.4 (H) 07/30/2022   RBC 3.41 (L) 07/30/2022   HGB 10.8 (L) 07/30/2022   HCT 32.4 (L) 07/30/2022   MCV 95.0 07/30/2022   MCH 31.7 07/30/2022   PLT 183 07/30/2022   MCHC 33.3 07/30/2022   RDW 12.6 07/30/2022   LYMPHSABS 0.8 04/22/2018   MONOABS 1.2 (H) 04/22/2018   EOSABS 0.0 04/22/2018   BASOSABS 0.1 04/22/2018     Last metabolic panel Lab Results  Component Value Date   NA 135 07/30/2022   K 4.0 07/30/2022   CL 104 07/30/2022   CO2 21 (L) 07/30/2022   BUN 24 (H) 07/30/2022   CREATININE 1.50 (H) 07/30/2022   GLUCOSE 185 (H) 07/30/2022   GFRNONAA 49 (L) 07/30/2022   GFRAA >60 04/23/2018   CALCIUM 8.1 (L) 07/30/2022   PHOS 3.0 05/27/2022   PROT 6.7 07/29/2022   ALBUMIN 3.8 07/29/2022   LABGLOB 3.5 09/04/2014   BILITOT 0.6 07/29/2022   ALKPHOS 64 07/29/2022   AST 32 07/29/2022   ALT 25 07/29/2022   ANIONGAP 10 07/30/2022    CBG (last 3)  No results for input(s): "GLUCAP" in the last 72 hours.    Coagulation Profile: Recent Labs  Lab 07/29/22 1147  INR 1.0     Radiology Studies: DG FEMUR MIN 2 VIEWS LEFT  Result Date: 07/29/2022 CLINICAL DATA:  Intramedullary nailing of femur EXAM: LEFT FEMUR 2 VIEWS COMPARISON:  07/29/2022 FINDINGS: Six low resolution intraoperative spot views left femur. Total fluoroscopy time was 87.9 seconds, fluoroscopic dose of 16.99 mGy. Images were obtained during intramedullary rod and screw fixation of comminuted proximal  left femoral fracture. Small fracture fragment adjacent to the tip of distal fixating screw. IMPRESSION: Intraoperative fluoroscopic assistance provided during ORIF of proximal left femoral fracture. Electronically Signed   By: Jasmine Pang M.D.   On: 07/29/2022 20:14   DG C-Arm 1-60 Min-No Report  Result Date: 07/29/2022 Fluoroscopy  was utilized by the requesting physician.  No radiographic interpretation.   DG C-Arm 1-60 Min-No Report  Result Date: 07/29/2022 Fluoroscopy was utilized by the requesting physician.  No radiographic interpretation.   CT Hip Left Wo Contrast  Result Date: 07/29/2022 CLINICAL DATA:  Femoral neck fracture, patient presents for further characterization. EXAM: CT OF THE LEFT HIP WITHOUT CONTRAST TECHNIQUE: Multidetector CT imaging of the left hip was performed according to the standard protocol. Multiplanar CT image reconstructions were also generated. RADIATION DOSE REDUCTION: This exam was performed according to the departmental dose-optimization program which includes automated exposure control, adjustment of the mA and/or kV according to patient size and/or use of iterative reconstruction technique. COMPARISON:  None Available. FINDINGS: Bones/Joint/Cartilage Comminuted and angulated left intertrochanteric fracture with an oblique fracture component extending into the proximal diaphysis with 2.9 cm of posteromedial displacement of the major fracture fragment. Oblique fracture cleft involving the subcapital left femoral neck. Moderate osteoarthritis of the left hip. No other acute fracture or dislocation. No fracture or dislocation. Normal alignment. No joint effusion. Ligaments Ligaments are suboptimally evaluated by CT. Muscles and Tendons Muscles are normal. No muscle atrophy. No intramuscular fluid collection or hematoma. Soft tissue No fluid collection or hematoma.  No soft tissue mass. IMPRESSION: 1. Acute comminuted and angulated left intertrochanteric fracture with an  oblique fracture component extending into the proximal diaphysis with 2.9 cm of posteromedial displacement of the major fracture fragment. 2. Acute subcapital left femoral neck fracture. Electronically Signed   By: Elige Ko M.D.   On: 07/29/2022 13:03   DG Pelvis Portable  Result Date: 07/29/2022 CLINICAL DATA:  Patient fell off a ladder. EXAM: PORTABLE PELVIS 1-2 VIEWS COMPARISON:  04/23/2018 FINDINGS: Severely comminuted left intertrochanteric femoral neck fracture identified with varus angulation. Fracture line extends into the proximal diaphysis. SI joints and symphysis pubis unremarkable. No evidence for pubic ramus fracture. IMPRESSION: Severely comminuted left intertrochanteric femoral neck fracture with varus angulation. Electronically Signed   By: Kennith Center M.D.   On: 07/29/2022 12:37   DG Femur Portable Min 2 Views Left  Result Date: 07/29/2022 CLINICAL DATA:  Patient fell off a ladder. EXAM: LEFT FEMUR PORTABLE 2 VIEWS COMPARISON:  None Available. FINDINGS: Severely comminuted intertrochanteric left femoral neck fracture with varus angulation. Fracture line extends into the proximal femoral diaphysis. Calcifications in the region of the popliteal fossa on the lateral film are probably loose bodies in a Baker's cyst. IMPRESSION: Severely comminuted intertrochanteric left femoral neck fracture with varus angulation. Electronically Signed   By: Kennith Center M.D.   On: 07/29/2022 12:30   DG Chest Port 1 View  Result Date: 07/29/2022 CLINICAL DATA:  Status post fall EXAM: PORTABLE CHEST 1 VIEW COMPARISON:  04/23/2018 FINDINGS: The heart size and mediastinal contours are within normal limits. Both lungs are clear. The visualized skeletal structures are unremarkable. IMPRESSION: No active disease. Electronically Signed   By: Elige Ko M.D.   On: 07/29/2022 12:25       Kathlen Mody M.D. [facility] 07/30/2022, 11:01 AM  Available via Epic secure chat 7am-7pm After 7 pm, please  refer to night coverage provider listed on amion.

## 2022-07-30 NOTE — Evaluation (Signed)
Physical Therapy Evaluation Patient Details Name: Randall Reeves MRN: [id number] DOB: 07-Feb-1945 Today's Date: 07/30/2022  History of Present Illness  77 y.o. male presenting to ED after a fall onto L hip. S/p L IM nail, PWB 50%. PMH:CKD 3A, neuropathy, hypertension, hyperlipidemia, GERD, OSA, CAD, BPH, splenic and renal artery aneurysms  Clinical Impression  PTA pt living with wife in multistory home with 4 steps to enter and master bed and bath on main level. 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 Pt is currently limited in safe mobility by orthostatic hypotension and "11/10" pain in standing. Pt is total A for bed mobility and maxA to come to standing. Patient will benefit from continued inpatient follow up therapy, <3 hours/day. However if hypotension is rectified, pt Smola be able to dc home with HHPT. PT will continue to work with him acutely towards that goal.        Assistance Recommended at Discharge Frequent or constant Supervision/Assistance  If plan is discharge home, recommend the following:  Can travel by private vehicle  Two people to help with walking and/or transfers;A lot of help with bathing/dressing/bathroom;Assistance with cooking/housework;Assist for transportation;Help with stairs or ramp for entrance   No    Equipment Recommendations Rolling walker (2 wheels);BSC/3in1  Recommendations for Other Services  OT consult    Functional Status Assessment Patient has had a recent decline in their functional status and demonstrates the ability to make significant improvements in function in a reasonable and predictable amount of time.     Precautions / Restrictions Precautions Precautions: Fall Precaution Comments: fell from ladder Restrictions Weight Bearing Restrictions: Yes LLE Weight Bearing: Partial weight bearing LLE Partial Weight Bearing Percentage or Pounds: 50      Mobility  Bed Mobility Overal bed mobility:  Needs Assistance Bed Mobility: Supine to Sit, Sit to Supine     Supine to sit: Max assist, HOB elevated Sit to supine: Total assist   General bed mobility comments: pt able to walk L LE to EoB with use of bridging and min A of therapist, requires maxA for bringing hips to EoB and modA for bringing trunk to upright, total A for returning to supine due to dizziness    Transfers Overall transfer level: Needs assistance Equipment used: Rolling walker (2 wheels) Transfers: Sit to/from Stand Sit to Stand: Max assist, From elevated surface           General transfer comment: maxA and 2 attempts to come to standing from elevated position, and modA for steadying in standing as pt with increasing dizziness  and ultimately requires modA for safely sitting EoB    Ambulation/Gait               General Gait Details: unable        Balance Overall balance assessment: Needs assistance Sitting-balance support: Bilateral upper extremity supported, Single extremity supported, No upper extremity supported, Feet supported Sitting balance-Leahy Scale: Fair     Standing balance support: Bilateral upper extremity supported, During functional activity, Reliant on assistive device for balance Standing balance-Leahy Scale: Poor Standing balance comment: requires outside assist                             Pertinent Vitals/Pain Pain Assessment Pain Assessment: 0-10 Pain Score: 10-Worst pain ever Pain Location: L LE with standing    Home Living Family/patient expects to be discharged to:: Private residence Living Arrangements: Spouse/significant  other Available Help at Discharge: Family;Available 24 hours/day Type of Home: House Home Access: Stairs to enter Entrance Stairs-Rails: Right Entrance Stairs-Number of Steps: 4   Home Layout: Multi-level;Able to live on main level with bedroom/bathroom Home Equipment: Shower seat - built in;Hand held shower head      Prior  Function Prior Level of Function : Independent/Modified Independent;Driving                     Hand Dominance   Dominant Hand: Right    Extremity/Trunk Assessment   Upper Extremity Assessment Upper Extremity Assessment: Overall WFL for tasks assessed    Lower Extremity Assessment Lower Extremity Assessment: RLE deficits/detail;LLE deficits/detail RLE Sensation: history of peripheral neuropathy LLE Deficits / Details: L ankle ROM WFL, knee and hip ROM limited by pain LLE: Unable to fully assess due to pain LLE Sensation: history of peripheral neuropathy    Cervical / Trunk Assessment Cervical / Trunk Assessment: Kyphotic  Communication   Communication: No difficulties  Cognition Arousal/Alertness: Awake/alert Behavior During Therapy: WFL for tasks assessed/performed Overall Cognitive Status: Within Functional Limits for tasks assessed                                 General Comments: slightly slowed responses to questions possibly due to anesthesia        General Comments General comments (skin integrity, edema, etc.): pt with orthostatic hypotension, BP in supine 97/65, sitting 98/ 59 with standing dizzy and light headed, BP with return to seated 75/57, after return to supine 120/67        Assessment/Plan    PT Assessment Patient needs continued PT services  PT Problem List Decreased strength;Decreased range of motion;Decreased activity tolerance;Decreased balance;Decreased mobility;Decreased coordination;Cardiopulmonary status limiting activity;Decreased safety awareness;Impaired sensation;Pain       PT Treatment Interventions DME instruction;Gait training;Stair training;Functional mobility training;Therapeutic activities;Therapeutic exercise;Balance training;Cognitive remediation;Patient/family education    PT Goals (Current goals can be found in the Care Plan section)  Acute Rehab PT Goals Patient Stated Goal: go home PT Goal Formulation:  With patient/family Time For Goal Achievement: 08/13/22 Potential to Achieve Goals: Fair    Frequency Min 5X/week        AM-PAC PT "6 Clicks" Mobility  Outcome Measure Help needed turning from your back to your side while in a flat bed without using bedrails?: A Lot Help needed moving from lying on your back to sitting on the side of a flat bed without using bedrails?: Total Help needed moving to and from a bed to a chair (including a wheelchair)?: Total Help needed standing up from a chair using your arms (e.g., wheelchair or bedside chair)?: Total Help needed to walk in hospital room?: Total Help needed climbing 3-5 steps with a railing? : Total 6 Click Score: 7    End of Session Equipment Utilized During Treatment: Gait belt Activity Tolerance: Treatment limited secondary to medical complications (Comment) (orthostatic hypotension) Patient left: in bed;with call bell/phone within reach;with bed alarm set;with family/visitor present Nurse Communication: Mobility status;Patient requests pain meds PT Visit Diagnosis: Unsteadiness on feet (R26.81);History of falling (Z91.81);Muscle weakness (generalized) (M62.81);Dizziness and giddiness (R42);Pain;Difficulty in walking, not elsewhere classified (R26.2) Pain - Right/Left: Left Pain - part of body: Hip    Time: 0454-0981 PT Time Calculation (min) (ACUTE ONLY): 57 min   Charges:   PT Evaluation $PT Eval Moderate Complexity: 1 Mod PT Treatments $Therapeutic Activity: 38-52 mins PT  General Charges $$ ACUTE PT VISIT: 1 Visit         Nimco Bivens B. Beverely Risen PT, DPT Acute Rehabilitation Services Please use secure chat or  Call Office [phone]   Elon Alas Fleet 07/30/2022, 2:08 PM

## 2022-07-30 NOTE — Progress Notes (Signed)
PT Cancellation Note  Patient Details Name: Randall Reeves MRN: [id number] DOB: 1945-04-29   Cancelled Treatment:    Reason Eval/Treat Not Completed: (P) Medical issues which prohibited therapy Pt BP is soft and is being started on IV bolus. PT will follow back later this morning for evaluation.  Wane Mollett B. Beverely Risen PT, DPT Acute Rehabilitation Services Please use secure chat or  Call Office [phone]    Elon Alas [facility] 07/30/2022, 8:42 AM

## 2022-07-30 NOTE — Progress Notes (Signed)
   07/30/22 0038  BiPAP/CPAP/SIPAP  Reason BIPAP/CPAP not in use Non-compliant   Patient refused use of CPAP for the evening.

## 2022-07-30 NOTE — Progress Notes (Signed)
Pt continues to refuse    07/30/22 2349  BiPAP/CPAP/SIPAP  Reason BIPAP/CPAP not in use Non-compliant

## 2022-07-30 NOTE — Progress Notes (Signed)
   Subjective:  Randall Reeves is a 77 y.o. male, 1 Day Post-Op    s/p Procedure(s): INTRAMEDULLARY (IM) NAIL INTERTROCHANTERIC   Patient reports pain as mild to moderate.  Denies any new numbness or tingling. Denies calf pain.  Objective:   VITALS:   Vitals:   07/30/22 0730 07/30/22 0731 07/30/22 0732 07/30/22 0733  BP:   (!) 78/51 (!) 82/54  Pulse: 99 98 (!) 102 100  Resp: 20 18 20 16   Temp:      TempSrc:      SpO2: 94% 93% 94% 96%  Weight:      Height:       In hospital bed NAD  LUE:    Neurovascular intact Sensation intact distally Intact pulses distally Dorsiflexion/Plantar flexion intact Incision: dressing C/D/I Compartment soft Ioban dressing intact. Calves soft.   Lab Results  Component Value Date   WBC 13.4 (H) 07/30/2022   HGB 10.8 (L) 07/30/2022   HCT 32.4 (L) 07/30/2022   MCV 95.0 07/30/2022   PLT 183 07/30/2022   BMET    Component Value Date/Time   NA 135 07/30/2022 0308   K 4.0 07/30/2022 0308   CL 104 07/30/2022 0308   CO2 21 (L) 07/30/2022 0308   GLUCOSE 185 (H) 07/30/2022 0308   BUN 24 (H) 07/30/2022 0308   CREATININE 1.50 (H) 07/30/2022 0308   CREATININE 1.38 (H) 01/10/2014 1121   CALCIUM 8.1 (L) 07/30/2022 0308   GFRNONAA 49 (L) 07/30/2022 0308     Assessment/Plan: 1 Day Post-Op   Principal Problem:   Closed comminuted intertrochanteric fracture of left femur (HCC) Active Problems:   Hyperlipidemia with target LDL less than 70   Obstructive sleep apnea   Essential hypertension, benign   GERD   Coronary artery disease involving native coronary artery of native heart without angina pectoris   BPH with obstruction/lower urinary tract symptoms   Peripheral neuropathy   Stage 3a chronic kidney disease (HCC)   Advance diet Up with therapy Dispo: Work with PT for safe ambulation, eval if SNF needed. Ok for discharge once that is determined / is ambulating safe.    Weightbearing Status: up to 50% Weightbearing to the LLE as  tolerated but does not need to push for more weightbearing.  DVT Prophylaxis: Lovenox inpatient, aspirin 81mg  BID x 6 weeks outpatient.    Randall Reeves 07/30/2022, 8:36 AM  Dion Saucier PA-C  Physician Assistant with Dr. Rebekah [place] [facility]

## 2022-07-31 ENCOUNTER — Other Ambulatory Visit: Payer: Self-pay

## 2022-07-31 ENCOUNTER — Encounter (HOSPITAL_COMMUNITY): Payer: Self-pay | Admitting: Internal Medicine

## 2022-07-31 DIAGNOSIS — D649 Anemia, unspecified: Secondary | ICD-10-CM

## 2022-07-31 DIAGNOSIS — I480 Paroxysmal atrial fibrillation: Secondary | ICD-10-CM

## 2022-07-31 DIAGNOSIS — I4892 Unspecified atrial flutter: Secondary | ICD-10-CM

## 2022-07-31 DIAGNOSIS — S72142D Displaced intertrochanteric fracture of left femur, subsequent encounter for closed fracture with routine healing: Secondary | ICD-10-CM | POA: Diagnosis not present

## 2022-07-31 LAB — BASIC METABOLIC PANEL
Anion gap: 5 (ref 5–15)
BUN: 30 mg/dL — ABNORMAL HIGH (ref 8–23)
CO2: 24 mmol/L (ref 22–32)
Calcium: 7.7 mg/dL — ABNORMAL LOW (ref 8.9–10.3)
Chloride: 107 mmol/L (ref 98–111)
Creatinine, Ser: 1.39 mg/dL — ABNORMAL HIGH (ref 0.61–1.24)
GFR, Estimated: 53 mL/min — ABNORMAL LOW (ref >60)
Glucose, Bld: 117 mg/dL — ABNORMAL HIGH (ref 70–99)
Potassium: 3.8 mmol/L (ref 3.5–5.1)
Sodium: 136 mmol/L (ref 135–145)

## 2022-07-31 LAB — TSH: TSH: 6.3 u[IU]/mL — ABNORMAL HIGH (ref 0.350–4.500)

## 2022-07-31 LAB — CBC
HCT: 26 % — ABNORMAL LOW (ref 39.0–52.0)
Hemoglobin: 8.7 g/dL — ABNORMAL LOW (ref 13.0–17.0)
MCH: 32.7 pg (ref 26.0–34.0)
MCHC: 33.5 g/dL (ref 30.0–36.0)
MCV: 97.7 fL (ref 80.0–100.0)
Platelets: 156 10*3/uL (ref 150–400)
RBC: 2.66 MIL/uL — ABNORMAL LOW (ref 4.22–5.81)
RDW: 12.6 % (ref 11.5–15.5)
WBC: 12.1 10*3/uL — ABNORMAL HIGH (ref 4.0–10.5)
nRBC: 0 % (ref 0.0–0.2)

## 2022-07-31 LAB — D-DIMER, QUANTITATIVE: D-Dimer, Quant: 1.87 ug/mL-FEU — ABNORMAL HIGH (ref 0.00–0.50)

## 2022-07-31 LAB — TROPONIN I (HIGH SENSITIVITY): Troponin I (High Sensitivity): 9 ng/L (ref <18)

## 2022-07-31 MED ORDER — AMIODARONE HCL IN DEXTROSE 360-4.14 MG/200ML-% IV SOLN
30.0000 mg/h | INTRAVENOUS | Status: DC
  Administered 2022-07-31 (×2): 30 mg/h via INTRAVENOUS
  Filled 2022-07-31 (×2): qty 200

## 2022-07-31 MED ORDER — SODIUM CHLORIDE 0.9 % IV BOLUS
500.0000 mL | Freq: Once | INTRAVENOUS | Status: AC
  Administered 2022-07-31: 500 mL via INTRAVENOUS

## 2022-07-31 MED ORDER — METOPROLOL TARTRATE 5 MG/5ML IV SOLN
2.5000 mg | INTRAVENOUS | Status: AC | PRN
  Administered 2022-07-31 (×3): 2.5 mg via INTRAVENOUS
  Filled 2022-07-31 (×2): qty 5

## 2022-07-31 MED ORDER — SODIUM CHLORIDE 0.9 % IV BOLUS
250.0000 mL | Freq: Once | INTRAVENOUS | Status: AC
  Administered 2022-07-31: 250 mL via INTRAVENOUS

## 2022-07-31 MED ORDER — AMIODARONE HCL IN DEXTROSE 360-4.14 MG/200ML-% IV SOLN
60.0000 mg/h | INTRAVENOUS | Status: DC
  Administered 2022-07-31: 60 mg/h via INTRAVENOUS
  Filled 2022-07-31 (×2): qty 200

## 2022-07-31 MED ORDER — POTASSIUM CHLORIDE CRYS ER 20 MEQ PO TBCR
40.0000 meq | EXTENDED_RELEASE_TABLET | Freq: Once | ORAL | Status: AC
  Administered 2022-07-31: 40 meq via ORAL
  Filled 2022-07-31: qty 2

## 2022-07-31 MED ORDER — AMIODARONE IV BOLUS ONLY 150 MG/100ML
150.0000 mg | Freq: Once | INTRAVENOUS | Status: AC
  Administered 2022-07-31: 150 mg via INTRAVENOUS

## 2022-07-31 MED ORDER — AMIODARONE LOAD VIA INFUSION
150.0000 mg | Freq: Once | INTRAVENOUS | Status: AC
  Administered 2022-07-31: 150 mg via INTRAVENOUS
  Filled 2022-07-31: qty 83.34

## 2022-07-31 NOTE — Progress Notes (Signed)
    Patient Name: Randall Reeves           DOB: 1945/04/02  MRN: [id number]      Admission Date: 07/29/2022  Attending Provider: Kathlen Mody, MD  Primary Diagnosis: Closed comminuted intertrochanteric fracture of left femur [facility])   Level of care: Progressive    CROSS COVER NOTE   Date of Service   07/31/2022   Randall Reeves, 76 y.o. male, was admitted on 07/29/2022 for Closed comminuted intertrochanteric fracture of left femur (HCC).    HPI/Events of Note   Sudden new onset atrial flutter with RVR, HR 150s Denies chest pain, shortness of breath, palpitations.  Only endorses feeling dizzy  Hemodynamically stable, SBP 110-120s. IV Lopressor was trialed for rate control.  After Lopressor given, HR 140s, BP 95/79. Will transfer patient to progressive unit for amiodarone gtt.   Interventions/ Plan   EKG-atrial flutter with 2:1 AV conduction IV Lopressor, 7.5 mg total given Fluid bolus, 750 cc total Amiodarone bolus and gtt. Transferred to progressive Labs-CBC, BMP, TSH, troponin, D-dimer        Randall Harada, DNP, ACNPC- AG [facility] [place]

## 2022-07-31 NOTE — Evaluation (Signed)
Occupational Therapy Evaluation Patient Details Name: Randall Reeves MRN: [id number] DOB: 02-Apr-1945 Today's Date: 07/31/2022   History of Present Illness 77 y.o. male presenting to ED after a fall onto L hip. S/p L IM nail, PWB 50%. PMH:CKD 3A, neuropathy, hypertension, hyperlipidemia, GERD, OSA, CAD, BPH, splenic and renal artery aneurysms   Clinical Impression   Pt is typically independent. Presents with L hip pain, poor standing balance and generalized weakness. Stands with moderate assistance. Transferred to bed with stedy. Pt requires set up to total assist for ADLs. Pt with symptomatic orthostatic hypotension with PT in earlier session. Did not assess this visit, but pt c/o lightheadedness in standing. Patient will benefit from continued inpatient follow up therapy, <3 hours/day      Recommendations for follow up therapy are one component of a multi-disciplinary discharge planning process, led by the attending physician.  Recommendations Chiu be updated based on patient status, additional functional criteria and insurance authorization.   Assistance Recommended at Discharge Frequent or constant Supervision/Assistance  Patient can return home with the following Two people to help with walking and/or transfers;A lot of help with bathing/dressing/bathroom;Assistance with cooking/housework;Assistance with feeding;Assist for transportation;Help with stairs or ramp for entrance    Functional Status Assessment  Patient has had a recent decline in their functional status and demonstrates the ability to make significant improvements in function in a reasonable and predictable amount of time.  Equipment Recommendations  Other (comment) (defer)    Recommendations for Other Services       Precautions / Restrictions Precautions Precautions: Fall Restrictions Weight Bearing Restrictions: Yes LLE Weight Bearing: Partial weight bearing LLE Partial Weight Bearing Percentage or Pounds: 50%       Mobility Bed Mobility Overal bed mobility: Needs Assistance Bed Mobility: Sit to Supine       Sit to supine: Mod assist   General bed mobility comments: assist for LEs into bed    Transfers Overall transfer level: Needs assistance Equipment used: Ambulation equipment used Transfers: Sit to/from Stand Sit to Stand: Mod assist           General transfer comment: assist to rise and steady      Balance Overall balance assessment: Needs assistance   Sitting balance-Leahy Scale: Fair     Standing balance support: Bilateral upper extremity supported, During functional activity, Reliant on assistive device for balance Standing balance-Leahy Scale: Poor                             ADL either performed or assessed with clinical judgement   ADL Overall ADL's : Needs assistance/impaired Eating/Feeding: Set up;Bed level   Grooming: Oral care;Bed level;Set up   Upper Body Bathing: Set up;Sitting   Lower Body Bathing: Total assistance;Sit to/from stand   Upper Body Dressing : Set up;Sitting   Lower Body Dressing: Total assistance;Sit to/from stand       Toileting- Architect and Hygiene: +2 for physical assistance;Total assistance;Sit to/from stand               Vision Baseline Vision/History: 1 Wears glasses Ability to See in Adequate Light: 0 Adequate Patient Visual Report: No change from baseline       Perception     Praxis      Pertinent Vitals/Pain Pain Assessment Pain Assessment: Faces Faces Pain Scale: Hurts even more Pain Location: L LE Pain Descriptors / Indicators: Guarding, Grimacing Pain Intervention(s): Monitored during session, Premedicated before session, Ice  applied     Hand Dominance Right   Extremity/Trunk Assessment Upper Extremity Assessment Upper Extremity Assessment: Overall WFL for tasks assessed   Lower Extremity Assessment Lower Extremity Assessment: Defer to PT evaluation   Cervical / Trunk  Assessment Cervical / Trunk Assessment: Kyphotic   Communication Communication Communication: No difficulties   Cognition Arousal/Alertness: Awake/alert Behavior During Therapy: WFL for tasks assessed/performed Overall Cognitive Status: Within Functional Limits for tasks assessed                                       General Comments       Exercises     Shoulder Instructions      Home Living Family/patient expects to be discharged to:: Private residence Living Arrangements: Spouse/significant other Available Help at Discharge: Family;Available 24 hours/day (wife cannot physically assist) Type of Home: House Home Access: Stairs to enter [employer] of Steps: 2 Entrance Stairs-Rails: Right;Left Home Layout: Multi-level;Able to live on main level with bedroom/bathroom     Bathroom Shower/Tub: Producer, television/film/video: Handicapped height     Home Equipment: Shower seat - built in;Hand held shower head          Prior Functioning/Environment Prior Level of Function : Independent/Modified Independent;Driving                        OT Problem List: Decreased strength;Impaired balance (sitting and/or standing);Decreased activity tolerance;Pain;Decreased knowledge of precautions;Cardiopulmonary status limiting activity      OT Treatment/Interventions: Self-care/ADL training;DME and/or AE instruction;Therapeutic activities;Patient/family education;Balance training    OT Goals(Current goals can be found in the care plan section) Acute Rehab OT Goals OT Goal Formulation: With patient Time For Goal Achievement: 08/14/22 Potential to Achieve Goals: Good ADL Goals Pt Will Perform Grooming: with min guard assist Pt Will Perform Lower Body Bathing: with min assist;with adaptive equipment;sit to/from stand Pt Will Perform Lower Body Dressing: with min assist;sit to/from stand;with adaptive equipment Pt Will Transfer to Toilet: with  min assist;ambulating;bedside commode Pt Will Perform Toileting - Clothing Manipulation and hygiene: with min assist;sit to/from stand Additional ADL Goal #1: Pt will complete bed mobility modified independently in preparation for ADLs.  OT Frequency: Min 1X/week    Co-evaluation              AM-PAC OT "6 Clicks" Daily Activity     Outcome Measure Help from another person eating meals?: A Little Help from another person taking care of personal grooming?: A Little Help from another person toileting, which includes using toliet, bedpan, or urinal?: Total Help from another person bathing (including washing, rinsing, drying)?: A Lot Help from another person to put on and taking off regular upper body clothing?: A Little Help from another person to put on and taking off regular lower body clothing?: Total 6 Click Score: 13   End of Session Equipment Utilized During Treatment: Gait belt Nurse Communication: Need for lift equipment  Activity Tolerance: Patient tolerated treatment well Patient left: in bed;with call bell/phone within reach;with bed alarm set;with family/visitor present  OT Visit Diagnosis: Unsteadiness on feet (R26.81);Other abnormalities of gait and mobility (R26.89);Pain;Muscle weakness (generalized) (M62.81);History of falling (Z91.81)                Time: 1610-9604 OT Time Calculation (min): 22 min Charges:  OT General Charges $OT Visit: 1 Visit OT Evaluation $OT Eval Moderate  Complexity: 1 Mod  Berna Spare, OTR/L Acute Rehabilitation Services Office: [phone]   Randall Reeves, [place] 07/31/2022, 3:31 PM

## 2022-07-31 NOTE — TOC CAGE-AID Note (Signed)
Transition of Care [facility]) - CAGE-AID Screening   Patient Details  Name: Randall Reeves MRN: [id number] Date of Birth: 12-12-1945  Transition of Care [facility]) CM/SW Contact:    Leota Sauers, RN Phone Number: 07/31/2022, 6:00 AM   Clinical Narrative:  Cherlynn Perches some alcohol use, denies use of illicit drugs. Education not given at this time.  CAGE-AID Screening:    Have You Ever Felt You Ought to Cut Down on Your Drinking or Drug Use?: No Have People Annoyed You By Critizing Your Drinking Or Drug Use?: No Have You Felt Bad Or Guilty About Your Drinking Or Drug Use?: No Have You Ever Had a Drink or Used Drugs First Thing In The Morning to Steady Your Nerves or to Get Rid of a Hangover?: No CAGE-AID Score: 0  Substance Abuse Education Offered: No

## 2022-07-31 NOTE — Progress Notes (Signed)
Patient became significantly orthostatic while standing working with PT. Upon sitting in chair patient was very diaphoretic and became minimally responsive; BP was 81/52. After 5 minutes laying reclined in the chair, BP recovered to 108/60 and patient felt back to baseline. Dr. Eden Emms was at bedside during this time and gave verbal orders for NS bolus which was given.

## 2022-07-31 NOTE — Consult Note (Signed)
CARDIOLOGY CONSULT NOTE       Patient ID: Randall Reeves MRN: [id number] DOB/AGE: March 03, 1945 77 y.o.  Admit date: 07/29/2022 Referring Physician: [name] Primary Physician: [name], MD Primary Cardiologist: Bryan Lemma Reason for Consultation: PAF  Principal Problem:   Closed comminuted intertrochanteric fracture of left femur [facility]) Active Problems:   Hyperlipidemia with target LDL less than 70   Obstructive sleep apnea   Essential hypertension, benign   GERD   Coronary artery disease involving native coronary artery of native heart without angina pectoris   BPH with obstruction/lower urinary tract symptoms   Peripheral neuropathy   Stage 3a chronic kidney disease (HCC)   Hip fracture (HCC)   PAF (paroxysmal atrial fibrillation) (HCC)   HPI:  77 y.o. admitted 7/4 after fall from ladder and now 2 days post op left intramedullary implant for intertrochanteric fracture. Cath in 2016 with non obstructive dx. Had normal cardiopulmonary stress test 03/17/20 History of HTN and HLD. He has OSA and uses CPAP.  Early this am had 3 brief episodes of PAF with rates in 120-130 range has converted. When I saw him in room he was working with PT and was very weak trying to bear weight with walker and became postural with systolic BP 80 mmHg/  He returned to normal after trendelenburg and sitting  His hip incision is healing well His TSH is 6.3, Cr 1.39 K 3.8 Hct 26 and PLT 156 His [place] VASC score is 3/    ROS All other systems reviewed and negative except as noted above  Past Medical History:  Diagnosis Date   Allergy    Arthritis    Cataract    CKD (chronic kidney disease), stage III (HCC)    Coronary artery disease, non-occlusive 09/2009   Minimal CAD / no flow-limiting lesions on Cath; CPX February 2022: 1200 exercise, reached 91% image PRN.  Peak VO2 26.5-normal= excellent functional capacity.  High anaerobic threshold suggest excellent trending effect.  At peak exercise, limited  by obesity.:  Normal CPX   GERD (gastroesophageal reflux disease)    Hyperlipidemia    Hypertension    Myocardial infarction - Type 2a MI 2015    Felt to be Type II MI-demand infarct   Neuromuscular disorder (HCC)    Obstructive sleep apnea    Cpap as of 27 days ago   Sleep apnea    wears cpap    Syncope    due to hypotension from BP meds     Family History  Problem Relation Age of Onset   Stroke Mother    Hypertension Mother    Diabetes Father    Asthma Father    Heart failure Father    Heart disease Father    Hypertension Sister    Diabetes Sister    Neuropathy Brother    Hypertension Brother    Diabetes Brother    Heart disease Brother    Prostate cancer Brother    Hypertension Brother    Colon polyps Brother    Hypertension Brother    Hypertension Brother    Rectal cancer Neg Hx    Stomach cancer Neg Hx    Esophageal cancer Neg Hx    Colon cancer Neg Hx     Social History   Socioeconomic History   Marital status: Married    Spouse name: Not on file   Number of children: 2   Years of education: Not on file   Highest education level: Not on file  Occupational History   Occupation: Aeronautical engineer  Tobacco Use   Smoking status: Never    Passive exposure: Past   Smokeless tobacco: Never  Vaping Use   Vaping Use: Never used  Substance and Sexual Activity   Alcohol use: Yes    Alcohol/week: 3.0 - 5.0 standard drinks of alcohol    Types: 3 - 5 Cans of beer per week    Comment:  a few beers in a week.   Drug use: No   Sexual activity: Not on file  Other Topics Concern   Not on file  Social History Narrative   Has living will   Wife is his health care POA--alternate is son   Would accept resuscitation attempts.   Would leave feeding tube decision to his wife   Social Determinants of Health   Financial Resource Strain: Not on file  Food Insecurity: No Food Insecurity (07/15/2022)   Hunger Vital Sign    Worried About Running Out of Food in the Last Year:  Never true    Ran Out of Food in the Last Year: Never true  Transportation Needs: No Transportation Needs (07/15/2022)   PRAPARE - Administrator, Civil Service (Medical): No    Lack of Transportation (Non-Medical): No  Physical Activity: Not on file  Stress: Not on file  Social Connections: Not on file  Intimate Partner Violence: Not on file    Past Surgical History:  Procedure Laterality Date   CARDIOPULMONARY STRESS TEST  03/17/2020   PreEx PFTs: FVC 4.33 (105%), FEV1 3.23 (104%) FEV1/FVC 75 (98% ) MVV 90 (73%): NORMAL; Exercise 12 min;hip and knee fatigue and mild dyspnea.  Rest HR 80 bpm-> max HR 135 bpm (91% of MPHR); BP 124/76 up to 166/74 mmHg; good exercise effort. Peak VO2 26.5 (108% predicted); VE/VCO2 slope 30.  Peak RER 1.1, ventilator threshold 20.6 (84% predicted, 78% measured peak VO2).  Pulse ox remained 94 to 99%   CARPAL TUNNEL RELEASE Left 02/2013   COLONOSCOPY     INGUINAL HERNIA REPAIR Left 1983   with vasectomy   LEFT HEART CATH AND CORONARY ANGIOGRAPHY  10/20/2009   (In the setting of MI with trivial troponin elevation). LM normal --<LAD, LCx. LAD (2 small Diags) - no significant disease, LCx (co-dom with PDA) & 2 OMs (OM2 moderate size with faint "hazy" defect - not flow limiting. Co-dom RCA with minor irregularities.  EF ~60%. ? Mild Inf HK. --> Med Rx.  Felt to be Type II MI-demand infarct   POLYPECTOMY     SPERMATOCELECTOMY  1998   TRANSTHORACIC ECHOCARDIOGRAM  10/2012; 12/2015   a) EF 55 to 60%.  GR 1 DD.  Normal motion.  Normal valves.;; b) Normal LV size and function.  EF 55 to 60%.  GR 2 DD.  Normal valves.   VASECTOMY  1983   with inguinal hernia repair      Current Facility-Administered Medications:    0.9 %  sodium chloride infusion, , Intravenous, Continuous, Blake Divine, Vijaya, MD, Last Rate: 75 mL/hr at 07/31/22 0805, Infusion Verify at 07/31/22 0805   acetaminophen (TYLENOL) tablet 650 mg, 650 mg, Oral, Q4H PRN, Kathlen Mody, MD, 650 mg at  07/30/22 0945   [COMPLETED] amiodarone (NEXTERONE) 1.8 mg/mL load via infusion 150 mg, 150 mg, Intravenous, Once, 150 mg at 07/31/22 0322 **FOLLOWED BY** [EXPIRED] amiodarone (NEXTERONE PREMIX) 360-4.14 MG/200ML-% (1.8 mg/mL) IV infusion, 60 mg/hr, Intravenous, Continuous, Stopped at 07/31/22 0552 **FOLLOWED BY** amiodarone (NEXTERONE PREMIX) 360-4.14 MG/200ML-% (1.8 mg/mL)  IV infusion, 30 mg/hr, Intravenous, Continuous, Anthoney Harada, NP, Last Rate: 16.67 mL/hr at 07/31/22 0933, 30 mg/hr at 07/31/22 0933   docusate sodium (COLACE) capsule 100 mg, 100 mg, Oral, BID, Yolonda Kida, MD, 100 mg at 07/31/22 0917   enoxaparin (LOVENOX) injection 40 mg, 40 mg, Subcutaneous, Q24H, Yolonda Kida, MD, 40 mg at 07/31/22 0919   HYDROmorphone (DILAUDID) injection 1 mg, 1 mg, Intravenous, Q2H PRN, Yolonda Kida, MD, 1 mg at 07/31/22 0052   menthol-cetylpyridinium (CEPACOL) lozenge 3 mg, 1 lozenge, Oral, PRN **OR** phenol (CHLORASEPTIC) mouth spray 1 spray, 1 spray, Mouth/Throat, PRN, Yolonda Kida, MD   methocarbamol (ROBAXIN) 500 mg in dextrose 5 % 50 mL IVPB, 500 mg, Intravenous, Q6H PRN, Yolonda Kida, MD, Stopped at 07/31/22 [phone]   metoCLOPramide (REGLAN) tablet 5-10 mg, 5-10 mg, Oral, Q8H PRN **OR** metoCLOPramide (REGLAN) injection 5-10 mg, 5-10 mg, Intravenous, Q8H PRN, Yolonda Kida, MD   ondansetron [facility]) tablet 4 mg, 4 mg, Oral, Q6H PRN **OR** ondansetron (ZOFRAN) injection 4 mg, 4 mg, Intravenous, Q6H PRN, Yolonda Kida, MD   pantoprazole (PROTONIX) EC tablet 40 mg, 40 mg, Oral, Daily, Yolonda Kida, MD, 40 mg at 07/31/22 9604   polyethylene glycol (MIRALAX / GLYCOLAX) packet 17 g, 17 g, Oral, Daily PRN, Yolonda Kida, MD   rosuvastatin (CRESTOR) tablet 10 mg, 10 mg, Oral, Daily, Yolonda Kida, MD, 10 mg at 07/31/22 0917   traMADol (ULTRAM) tablet 50 mg, 50 mg, Oral, Q12H PRN, Kathlen Mody, MD, 50 mg at 07/30/22 2221   docusate sodium  100 mg Oral BID   enoxaparin (LOVENOX) injection  40 mg Subcutaneous Q24H   pantoprazole  40 mg Oral Daily   rosuvastatin  10 mg Oral Daily    sodium chloride 75 mL/hr at 07/31/22 0805   amiodarone 30 mg/hr (07/31/22 0933)   methocarbamol (ROBAXIN) IV Stopped (07/31/22 5409)    Physical Exam: Blood pressure 108/60, pulse 81, temperature 98.1 F (36.7 C), temperature source Oral, resp. rate 15, height 5\' 10"  (1.778 m), weight 90.2 kg, SpO2 96 %.    Elderly male Diaphoretic when postural SEM Lungs clear Left hip incision clean and dry Abdomen benign No edema  Labs:   Lab Results  Component Value Date   WBC 12.1 (H) 07/31/2022   HGB 8.7 (L) 07/31/2022   HCT 26.0 (L) 07/31/2022   MCV 97.7 07/31/2022   PLT 156 07/31/2022    Recent Labs  Lab 07/29/22 1147 07/29/22 1154 07/31/22 0106  NA 137   < > 136  K 4.4   < > 3.8  CL 106   < > 107  CO2 23   < > 24  BUN 23   < > 30*  CREATININE 1.49*   < > 1.39*  CALCIUM 8.6*   < > 7.7*  PROT 6.7  --   --   BILITOT 0.6  --   --   ALKPHOS 64  --   --   ALT 25  --   --   AST 32  --   --   GLUCOSE 140*   < > 117*   < > = values in this interval not displayed.   Lab Results  Component Value Date   CKTOTAL 195 10/18/2009   CKMB 3.0 10/18/2009   TROPONINI <0.03 04/22/2018    Lab Results  Component Value Date   CHOL 145 05/27/2022   CHOL 151 05/22/2021   CHOL 132 05/16/2020  Lab Results  Component Value Date   HDL 51.70 05/27/2022   HDL 59.30 05/22/2021   HDL 52.30 05/16/2020   Lab Results  Component Value Date   LDLCALC 78 05/22/2021   LDLCALC 45 05/16/2020   LDLCALC 74 05/15/2019   Lab Results  Component Value Date   TRIG 217.0 (H) 05/27/2022   TRIG 67.0 05/22/2021   TRIG 175.0 (H) 05/16/2020   Lab Results  Component Value Date   CHOLHDL 3 05/27/2022   CHOLHDL 3 05/22/2021   CHOLHDL 3 05/16/2020   Lab Results  Component Value Date   LDLDIRECT 68.0 05/27/2022   LDLDIRECT 68.0  02/22/2018   LDLDIRECT 115.2 01/08/2014      Radiology: DG FEMUR MIN 2 VIEWS LEFT  Result Date: 07/29/2022 CLINICAL DATA:  Intramedullary nailing of femur EXAM: LEFT FEMUR 2 VIEWS COMPARISON:  07/29/2022 FINDINGS: Six low resolution intraoperative spot views left femur. Total fluoroscopy time was 87.9 seconds, fluoroscopic dose of 16.99 mGy. Images were obtained during intramedullary rod and screw fixation of comminuted proximal left femoral fracture. Small fracture fragment adjacent to the tip of distal fixating screw. IMPRESSION: Intraoperative fluoroscopic assistance provided during ORIF of proximal left femoral fracture. Electronically Signed   By: Jasmine Pang M.D.   On: 07/29/2022 20:14   DG C-Arm 1-60 Min-No Report  Result Date: 07/29/2022 Fluoroscopy was utilized by the requesting physician.  No radiographic interpretation.   DG C-Arm 1-60 Min-No Report  Result Date: 07/29/2022 Fluoroscopy was utilized by the requesting physician.  No radiographic interpretation.   CT Hip Left Wo Contrast  Result Date: 07/29/2022 CLINICAL DATA:  Femoral neck fracture, patient presents for further characterization. EXAM: CT OF THE LEFT HIP WITHOUT CONTRAST TECHNIQUE: Multidetector CT imaging of the left hip was performed according to the standard protocol. Multiplanar CT image reconstructions were also generated. RADIATION DOSE REDUCTION: This exam was performed according to the departmental dose-optimization program which includes automated exposure control, adjustment of the mA and/or kV according to patient size and/or use of iterative reconstruction technique. COMPARISON:  None Available. FINDINGS: Bones/Joint/Cartilage Comminuted and angulated left intertrochanteric fracture with an oblique fracture component extending into the proximal diaphysis with 2.9 cm of posteromedial displacement of the major fracture fragment. Oblique fracture cleft involving the subcapital left femoral neck. Moderate  osteoarthritis of the left hip. No other acute fracture or dislocation. No fracture or dislocation. Normal alignment. No joint effusion. Ligaments Ligaments are suboptimally evaluated by CT. Muscles and Tendons Muscles are normal. No muscle atrophy. No intramuscular fluid collection or hematoma. Soft tissue No fluid collection or hematoma.  No soft tissue mass. IMPRESSION: 1. Acute comminuted and angulated left intertrochanteric fracture with an oblique fracture component extending into the proximal diaphysis with 2.9 cm of posteromedial displacement of the major fracture fragment. 2. Acute subcapital left femoral neck fracture. Electronically Signed   By: Elige Ko M.D.   On: 07/29/2022 13:03   DG Pelvis Portable  Result Date: 07/29/2022 CLINICAL DATA:  Patient fell off a ladder. EXAM: PORTABLE PELVIS 1-2 VIEWS COMPARISON:  04/23/2018 FINDINGS: Severely comminuted left intertrochanteric femoral neck fracture identified with varus angulation. Fracture line extends into the proximal diaphysis. SI joints and symphysis pubis unremarkable. No evidence for pubic ramus fracture. IMPRESSION: Severely comminuted left intertrochanteric femoral neck fracture with varus angulation. Electronically Signed   By: Kennith Center M.D.   On: 07/29/2022 12:37   DG Femur Portable Min 2 Views Left  Result Date: 07/29/2022 CLINICAL DATA:  Patient fell off a ladder.  EXAM: LEFT FEMUR PORTABLE 2 VIEWS COMPARISON:  None Available. FINDINGS: Severely comminuted intertrochanteric left femoral neck fracture with varus angulation. Fracture line extends into the proximal femoral diaphysis. Calcifications in the region of the popliteal fossa on the lateral film are probably loose bodies in a Baker's cyst. IMPRESSION: Severely comminuted intertrochanteric left femoral neck fracture with varus angulation. Electronically Signed   By: Kennith Center M.D.   On: 07/29/2022 12:30   DG Chest Port 1 View  Result Date: 07/29/2022 CLINICAL DATA:   Status post fall EXAM: PORTABLE CHEST 1 VIEW COMPARISON:  04/23/2018 FINDINGS: The heart size and mediastinal contours are within normal limits. Both lungs are clear. The visualized skeletal structures are unremarkable. IMPRESSION: No active disease. Electronically Signed   By: Elige Ko M.D.   On: 07/29/2022 12:25    EKG: Post conversion SR nonspecific ST changes   ASSESSMENT AND PLAN:   PAF:  In setting of post operative anemia, hip fracture CHADVASC 3 In NSR currently Postural with systolic BP limits AV nodal drugs currently Continue iv amiodarone and change to PO in am TTE pending for LA size and EF would start eliquis when ok with orthopedics and Hct deemed stable  HTN:  with postural symptoms and PAF hold home ARB for now HLD:  continue statin  Ortho: weak and limited weight bearing PT/OT suspect he will need rehab   Signed: Charlton Haws 07/31/2022, 10:25 AM

## 2022-07-31 NOTE — Progress Notes (Signed)
   07/31/22 0326  Assess: MEWS Score  Temp 97.8 F (36.6 C)  BP 110/64  MAP (mmHg) 70  Pulse Rate (!) 143  ECG Heart Rate (!) 140  Resp 20  Level of Consciousness Alert  SpO2 97 %  O2 Device Room Air  Assess: MEWS Score  MEWS Temp 0  MEWS Systolic 0  MEWS Pulse 3  MEWS RR 0  MEWS LOC 0  MEWS Score 3  MEWS Score Color Yellow  Assess: if the MEWS score is Yellow or Red  Were vital signs taken at a resting state? Yes  Focused Assessment No change from prior assessment  Does the patient meet 2 or more of the SIRS criteria? Yes  Does the patient have a confirmed or suspected source of infection? No  MEWS guidelines implemented  Yes, yellow  Treat  MEWS Interventions Considered administering scheduled or prn medications/treatments as ordered  Take Vital Signs  Increase Vital Sign Frequency  Yellow: Q2hr x1, continue Q4hrs until patient remains green for 12hrs  Escalate  MEWS: Escalate Yellow: Discuss with charge nurse and consider notifying provider and/or RRT  Notify: Charge Nurse/RN  Name of Charge Nurse/RN Notified Francene Castle, RN  Provider Notification  Provider Name/Title  (Provider aware-received pt from 5N d/t HR)  Notify: Rapid Response  Name of Rapid Response RN Notified  (Rapid aware-tx from 5N by Rapid Response RN)  Assess: SIRS CRITERIA  SIRS Temperature  0  SIRS Pulse 1  SIRS Respirations  0  SIRS WBC 1  SIRS Score Sum  2

## 2022-07-31 NOTE — Significant Event (Signed)
Rapid Response Event Note   Reason for Call :  HR 150s Per staff patient complained of being dizzy.  When his VS were checked his HR was 150s Per staff he has recently had some Dilaudid for pain but otherwise has been resting in the bed.   Initial Focused Assessment:  He is alert and oriented lying in bed.  He complains of not feeling well and is dizzy. Lung sounds clear, decreased bases.  Heart tones regular/rapid He denies chest pain and shortness of breath.  BP 117/91  HR 156  RR 12  O2 sat 97% on RA     Interventions:  12 lead EKG 500cc NS bolus 2.5 Lopressor IV x 3   BP 95/79  AFl 149  RR 12  O2 sat 98% on RA Transfer to 6E19 Transported with cell phone, charger and glasses on his person.  Plan of Care:  Amiodarone bolus and gtt   Event Summary:   MD Notified: Jyl Heinz Call Time: 0158   Arrival Time: 0202 End Time: 8295  Marcellina Millin, RN

## 2022-07-31 NOTE — Progress Notes (Addendum)
[facility] PROGRESS NOTE   Randall Reeves ZOX:[id number] DOB: November 07, 1945 DOA: 07/29/2022  PCP: [name], MD  Brief History/Interval Summary: Randall Reeves is a 77 y.o. male with medical history significant for CKD 3A, neuropathy, hypertension, hyperlipidemia, GERD, OSA, CAD, BPH, splenic and renal artery aneurysms presenting after a fall at home. CT of the hip confirmed femur head fracture and also subcapital fracture. Orthopedic consulted and pt underwent intertrochanteric intramedullary nail placement on 07/29/22.   Consultants: Orthopedics  Procedures: ORIF 7/4    Subjective/Interval History: Patient complains of feeling fatigued this morning.  Some dizziness.  Denies any chest pain or shortness of breath.    Assessment/Plan:  Atrial flutter with RVR Overnight patient went into atrial flutter.  Patient was transferred to progressive floor.  Started on amiodarone infusion.  Seems like he had converted to sinus rhythm but then converted back into atrial flutter this morning.  Additional bolus dose of amiodarone ordered. Cardiology has been consulted. No previous history of same. Patient noted to be 6.3.  Will check TSH and free T4 tomorrow. No recent echocardiogram in our system.  This will also be ordered. Anticoagulation will be deferred to cardiology Elevated D-dimer noted.  Significance unclear in the absence of chest pain or shortness of breath.  No clear indication to do CT angiogram at this time.  Will wait on echocardiogram.  Left femur fracture. Status post ORIF.  PT and OT evaluation.  Pain control.  Bowel regimen.  Chronic kidney disease stage IIIa Stable.  Recheck labs tomorrow.  Essential hypertension Borderline low blood pressures noted.  Could be from his arrhythmia.  Will hold his oral agents.  Losartan on hold.  Leukocytosis Likely reactive.  Recheck labs tomorrow.  Normocytic anemia/acute blood loss anemia Most likely due to operative blood  loss.  No overt bleeding noted otherwise.  Recheck labs tomorrow.  Transfuse if it drops below 7.  Hyperlipidemia Continue statin.  Obstructive sleep apnea CPAP  History of BPH Holding Flomax.  History of coronary artery disease Stable.  DVT Prophylaxis: Lovenox Code Status: Full code Family Communication: Discussed with patient Disposition Plan: SNF    Medications: Scheduled:  docusate sodium  100 mg Oral BID   enoxaparin (LOVENOX) injection  40 mg Subcutaneous Q24H   pantoprazole  40 mg Oral Daily   rosuvastatin  10 mg Oral Daily   Continuous:  sodium chloride 75 mL/hr at 07/31/22 0805   amiodarone 30 mg/hr (07/31/22 0933)   methocarbamol (ROBAXIN) IV Stopped (07/31/22 0620)   WJX:BJYNWGNFAOZHY, HYDROmorphone (DILAUDID) injection, menthol-cetylpyridinium **OR** phenol, methocarbamol (ROBAXIN) IV, metoCLOPramide **OR** metoCLOPramide (REGLAN) injection, ondansetron **OR** ondansetron (ZOFRAN) IV, polyethylene glycol, traMADol  Antibiotics: Anti-infectives (From admission, onward)    Start     Dose/Rate Route Frequency Ordered Stop   07/29/22 2300  ceFAZolin (ANCEF) IVPB 2g/100 mL premix        2 g 200 mL/hr over 30 Minutes Intravenous Every 6 hours 07/29/22 1947 07/30/22 2221   07/29/22 1642  ceFAZolin (ANCEF) 2-4 GM/100ML-% IVPB       Note to Pharmacy: Rosiland Oz V: cabinet override      07/29/22 1642 07/30/22 0345       Objective:  Vital Signs  Vitals:   07/31/22 0735 07/31/22 0805 07/31/22 0941 07/31/22 0943  BP: 105/67 116/69 (!) 81/52 108/60  Pulse: 83 (!) 147 86 81  Resp: 10 12 14 15   Temp: 98.1 F (36.7 C)     TempSrc: Oral  SpO2: 95% 95% 98% 96%  Weight:      Height:        Intake/Output Summary (Last 24 hours) at 07/31/2022 0956 Last data filed at 07/31/2022 0805 Gross per 24 hour  Intake 2206.46 ml  Output 700 ml  Net 1506.46 ml   Filed Weights   07/29/22 1150 07/31/22 0326  Weight: 85.7 kg 90.2 kg    General appearance:  Awake alert.  In no distress Resp: Clear to auscultation bilaterally.  Normal effort Cardio: S1-S2 is tachycardic regular GI: Abdomen is soft.  Nontender nondistended.  Bowel sounds are present normal.  No masses organomegaly Extremities: No edema.  Full range of motion of lower extremities. Neurologic: Alert and oriented x3.  No focal neurological deficits.    Lab Results:  Data Reviewed: I have personally reviewed following labs and reports of the imaging studies  CBC: Recent Labs  Lab 07/29/22 1147 07/29/22 1154 07/29/22 2037 07/30/22 0308 07/31/22 0106  WBC 7.4  --  7.1 13.4* 12.1*  HGB 13.4 13.6 12.1* 10.8* 8.7*  HCT 40.2 40.0 36.4* 32.4* 26.0*  MCV 95.0  --  97.8 95.0 97.7  PLT 194  --  187 183 156    Basic Metabolic Panel: Recent Labs  Lab 07/29/22 1147 07/29/22 1154 07/29/22 2037 07/30/22 0308 07/31/22 0106  NA 137 138  --  135 136  K 4.4 4.5  --  4.0 3.8  CL 106 109  --  104 107  CO2 23  --   --  21* 24  GLUCOSE 140* 140*  --  185* 117*  BUN 23 27*  --  24* 30*  CREATININE 1.49* 1.50* 1.32* 1.50* 1.39*  CALCIUM 8.6*  --   --  8.1* 7.7*    GFR: Estimated Creatinine Clearance: 52.7 mL/min (A) (by C-G formula based on SCr of 1.39 mg/dL (H)).  Liver Function Tests: Recent Labs  Lab 07/29/22 1147  AST 32  ALT 25  ALKPHOS 64  BILITOT 0.6  PROT 6.7  ALBUMIN 3.8    Coagulation Profile: Recent Labs  Lab 07/29/22 1147  INR 1.0     Thyroid Function Tests: Recent Labs    07/31/22 0305  TSH 6.300*    Radiology Studies: DG FEMUR MIN 2 VIEWS LEFT  Result Date: 07/29/2022 CLINICAL DATA:  Intramedullary nailing of femur EXAM: LEFT FEMUR 2 VIEWS COMPARISON:  07/29/2022 FINDINGS: Six low resolution intraoperative spot views left femur. Total fluoroscopy time was 87.9 seconds, fluoroscopic dose of 16.99 mGy. Images were obtained during intramedullary rod and screw fixation of comminuted proximal left femoral fracture. Small fracture fragment adjacent  to the tip of distal fixating screw. IMPRESSION: Intraoperative fluoroscopic assistance provided during ORIF of proximal left femoral fracture. Electronically Signed   By: Jasmine Pang M.D.   On: 07/29/2022 20:14   DG C-Arm 1-60 Min-No Report  Result Date: 07/29/2022 Fluoroscopy was utilized by the requesting physician.  No radiographic interpretation.   DG C-Arm 1-60 Min-No Report  Result Date: 07/29/2022 Fluoroscopy was utilized by the requesting physician.  No radiographic interpretation.   CT Hip Left Wo Contrast  Result Date: 07/29/2022 CLINICAL DATA:  Femoral neck fracture, patient presents for further characterization. EXAM: CT OF THE LEFT HIP WITHOUT CONTRAST TECHNIQUE: Multidetector CT imaging of the left hip was performed according to the standard protocol. Multiplanar CT image reconstructions were also generated. RADIATION DOSE REDUCTION: This exam was performed according to the departmental dose-optimization program which includes automated exposure control, adjustment of the mA  and/or kV according to patient size and/or use of iterative reconstruction technique. COMPARISON:  None Available. FINDINGS: Bones/Joint/Cartilage Comminuted and angulated left intertrochanteric fracture with an oblique fracture component extending into the proximal diaphysis with 2.9 cm of posteromedial displacement of the major fracture fragment. Oblique fracture cleft involving the subcapital left femoral neck. Moderate osteoarthritis of the left hip. No other acute fracture or dislocation. No fracture or dislocation. Normal alignment. No joint effusion. Ligaments Ligaments are suboptimally evaluated by CT. Muscles and Tendons Muscles are normal. No muscle atrophy. No intramuscular fluid collection or hematoma. Soft tissue No fluid collection or hematoma.  No soft tissue mass. IMPRESSION: 1. Acute comminuted and angulated left intertrochanteric fracture with an oblique fracture component extending into the proximal  diaphysis with 2.9 cm of posteromedial displacement of the major fracture fragment. 2. Acute subcapital left femoral neck fracture. Electronically Signed   By: Elige Ko M.D.   On: 07/29/2022 13:03   DG Pelvis Portable  Result Date: 07/29/2022 CLINICAL DATA:  Patient fell off a ladder. EXAM: PORTABLE PELVIS 1-2 VIEWS COMPARISON:  04/23/2018 FINDINGS: Severely comminuted left intertrochanteric femoral neck fracture identified with varus angulation. Fracture line extends into the proximal diaphysis. SI joints and symphysis pubis unremarkable. No evidence for pubic ramus fracture. IMPRESSION: Severely comminuted left intertrochanteric femoral neck fracture with varus angulation. Electronically Signed   By: Kennith Center M.D.   On: 07/29/2022 12:37   DG Femur Portable Min 2 Views Left  Result Date: 07/29/2022 CLINICAL DATA:  Patient fell off a ladder. EXAM: LEFT FEMUR PORTABLE 2 VIEWS COMPARISON:  None Available. FINDINGS: Severely comminuted intertrochanteric left femoral neck fracture with varus angulation. Fracture line extends into the proximal femoral diaphysis. Calcifications in the region of the popliteal fossa on the lateral film are probably loose bodies in a Baker's cyst. IMPRESSION: Severely comminuted intertrochanteric left femoral neck fracture with varus angulation. Electronically Signed   By: Kennith Center M.D.   On: 07/29/2022 12:30   DG Chest Port 1 View  Result Date: 07/29/2022 CLINICAL DATA:  Status post fall EXAM: PORTABLE CHEST 1 VIEW COMPARISON:  04/23/2018 FINDINGS: The heart size and mediastinal contours are within normal limits. Both lungs are clear. The visualized skeletal structures are unremarkable. IMPRESSION: No active disease. Electronically Signed   By: Elige Ko M.D.   On: 07/29/2022 12:25       LOS: 1 day   Osvaldo Shipper  [facility] Pager on [email or web address]  07/31/2022, 9:56 AM

## 2022-07-31 NOTE — Progress Notes (Signed)
Physical Therapy Treatment Patient Details Name: Randall Reeves MRN: [id number] DOB: 1945/03/23 Today's Date: 07/31/2022   History of Present Illness 77 y.o. male presenting to ED after a fall onto L hip. S/p L IM nail, PWB 50%. PMH:CKD 3A, neuropathy, hypertension, hyperlipidemia, GERD, OSA, CAD, BPH, splenic and renal artery aneurysms    PT Comments  Pt received sitting on BSC with nursing present and agreeable to session. Pt motivated to participate in mobility tasks and progress gait. Pt able to stand from [facility] with mod A. Pt able to tolerate a few lateral steps to the recliner, but demonstrates difficulty offloading with BUE to step with RLE. Pt requiring frequent cues for upright posture and to maintain LLE PWB with pt reporting difficulty at times. Pt able to heel-to pivot with RLE with min A for balance and RW management. Pt with symptomatic orthostatic hypotension limiting distance and requiring seated rest break. Pt becoming minimally responsive and BP noted to be 81/52. BP improved to 108/60 with feet elevated and trunk reclined . Pt continues to benefit from PT services to progress toward functional mobility goals.     Assistance Recommended at Discharge Frequent or constant Supervision/Assistance  If plan is discharge home, recommend the following:  Can travel by private vehicle    Two people to help with walking and/or transfers;A lot of help with bathing/dressing/bathroom;Assistance with cooking/housework;Assist for transportation;Help with stairs or ramp for entrance   No  Equipment Recommendations  Rolling walker (2 wheels);BSC/3in1    Recommendations for Other Services       Precautions / Restrictions Precautions Precautions: Fall Precaution Comments: fell from ladder Restrictions Weight Bearing Restrictions: Yes LLE Weight Bearing: Partial weight bearing LLE Partial Weight Bearing Percentage or Pounds: 50%     Mobility  Bed Mobility               General bed  mobility comments: Pt recieved OOB with nursing    Transfers Overall transfer level: Needs assistance Equipment used: Rolling walker (2 wheels) Transfers: Sit to/from Stand Sit to Stand: Mod assist           General transfer comment: Mod A for power up from [facility] with cues for hand placement and placing LLE forward to reduce WB    Ambulation/Gait Ambulation/Gait assistance: Min assist, +2 safety/equipment Gait Distance (Feet): 3 Feet Assistive device: Rolling walker (2 wheels) Gait Pattern/deviations: Step-to pattern, Antalgic       General Gait Details: Pt able to take lateral steps to recliner with dense cues for upright posture and increased UE support to offload LLE. Pt with limited ability to fully step with RLE, however is able to heel-to pivot slowly. Pt limited by orthostatic hypotension. Pt reporting questionable ability to maintain LLE 50% WB at times. Min A for RW management and balance.      Balance Overall balance assessment: Needs assistance Sitting-balance support: No upper extremity supported, Feet supported Sitting balance-Leahy Scale: Fair Sitting balance - Comments: sitting on BSC   Standing balance support: Bilateral upper extremity supported, During functional activity, Reliant on assistive device for balance Standing balance-Leahy Scale: Poor Standing balance comment: with RW support and min A                            Cognition Arousal/Alertness: Awake/alert Behavior During Therapy: WFL for tasks assessed/performed Overall Cognitive Status: Within Functional Limits for tasks assessed  Exercises      General Comments General comments (skin integrity, edema, etc.): HR elevated at beginning of session, but [facility] during mobility. BP dropping to 81/52 after short gait trial with pt symptomatic, so pt's feet were elevated and trunk reclined with BP improving to 108/60.       Pertinent Vitals/Pain Pain Assessment Pain Assessment: Faces Faces Pain Scale: Hurts even more Pain Location: L LE with standing Pain Descriptors / Indicators: Guarding, Grimacing Pain Intervention(s): Monitored during session, Repositioned     PT Goals (current goals can now be found in the care plan section) Acute Rehab PT Goals Patient Stated Goal: go home PT Goal Formulation: With patient/family Time For Goal Achievement: 08/13/22 Potential to Achieve Goals: Fair Progress towards PT goals: Progressing toward goals    Frequency    Min 5X/week      PT Plan Current plan remains appropriate       AM-PAC PT "6 Clicks" Mobility   Outcome Measure  Help needed turning from your back to your side while in a flat bed without using bedrails?: A Lot Help needed moving from lying on your back to sitting on the side of a flat bed without using bedrails?: A Lot Help needed moving to and from a bed to a chair (including a wheelchair)?: A Lot Help needed standing up from a chair using your arms (e.g., wheelchair or bedside chair)?: A Lot Help needed to walk in hospital room?: A Lot Help needed climbing 3-5 steps with a railing? : Total 6 Click Score: 11    End of Session Equipment Utilized During Treatment: Gait belt Activity Tolerance: Treatment limited secondary to medical complications (Comment) (orthostatic hypotension) Patient left: in chair;with call bell/phone within reach;with nursing/sitter in room Nurse Communication: Mobility status;Need for lift equipment PT Visit Diagnosis: Unsteadiness on feet (R26.81);History of falling (Z91.81);Muscle weakness (generalized) (M62.81);Dizziness and giddiness (R42);Pain;Difficulty in walking, not elsewhere classified (R26.2) Pain - Right/Left: Left Pain - part of body: Hip     Time: 1610-9604 PT Time Calculation (min) (ACUTE ONLY): 23 min  Charges:    $Gait Training: 8-22 mins $Therapeutic Activity: 8-22 mins PT General  Charges $$ ACUTE PT VISIT: 1 Visit                     Johny Shock, PTA Acute Rehabilitation Services Secure Chat Preferred  [phone]    Johny Shock 07/31/2022, 10:02 AM

## 2022-08-01 ENCOUNTER — Inpatient Hospital Stay (HOSPITAL_COMMUNITY): Payer: PPO

## 2022-08-01 DIAGNOSIS — I4892 Unspecified atrial flutter: Secondary | ICD-10-CM | POA: Diagnosis not present

## 2022-08-01 DIAGNOSIS — I10 Essential (primary) hypertension: Secondary | ICD-10-CM | POA: Diagnosis not present

## 2022-08-01 DIAGNOSIS — D649 Anemia, unspecified: Secondary | ICD-10-CM | POA: Diagnosis not present

## 2022-08-01 DIAGNOSIS — I480 Paroxysmal atrial fibrillation: Secondary | ICD-10-CM

## 2022-08-01 DIAGNOSIS — S72142D Displaced intertrochanteric fracture of left femur, subsequent encounter for closed fracture with routine healing: Secondary | ICD-10-CM | POA: Diagnosis not present

## 2022-08-01 LAB — BASIC METABOLIC PANEL
Anion gap: 9 (ref 5–15)
BUN: 19 mg/dL (ref 8–23)
CO2: 23 mmol/L (ref 22–32)
Calcium: 8.1 mg/dL — ABNORMAL LOW (ref 8.9–10.3)
Chloride: 107 mmol/L (ref 98–111)
Creatinine, Ser: 1.23 mg/dL (ref 0.61–1.24)
GFR, Estimated: >60 mL/min (ref >60)
Glucose, Bld: 126 mg/dL — ABNORMAL HIGH (ref 70–99)
Potassium: 3.9 mmol/L (ref 3.5–5.1)
Sodium: 139 mmol/L (ref 135–145)

## 2022-08-01 LAB — CBC
HCT: 24.4 % — ABNORMAL LOW (ref 39.0–52.0)
Hemoglobin: 8.3 g/dL — ABNORMAL LOW (ref 13.0–17.0)
MCH: 32.5 pg (ref 26.0–34.0)
MCHC: 34 g/dL (ref 30.0–36.0)
MCV: 95.7 fL (ref 80.0–100.0)
Platelets: 155 10*3/uL (ref 150–400)
RBC: 2.55 MIL/uL — ABNORMAL LOW (ref 4.22–5.81)
RDW: 12.7 % (ref 11.5–15.5)
WBC: 9 10*3/uL (ref 4.0–10.5)
nRBC: 0 % (ref 0.0–0.2)

## 2022-08-01 LAB — ECHOCARDIOGRAM COMPLETE
Area-P 1/2: 2.95 cm2
Height: 70 in
S' Lateral: 2.7 cm
Weight: 3294.55 oz

## 2022-08-01 LAB — MAGNESIUM: Magnesium: 2 mg/dL (ref 1.7–2.4)

## 2022-08-01 LAB — URINALYSIS, ROUTINE W REFLEX MICROSCOPIC
Bilirubin Urine: NEGATIVE
Glucose, UA: NEGATIVE mg/dL
Hgb urine dipstick: NEGATIVE
Ketones, ur: NEGATIVE mg/dL
Leukocytes,Ua: NEGATIVE
Nitrite: NEGATIVE
Protein, ur: NEGATIVE mg/dL
Specific Gravity, Urine: 1.01 (ref 1.005–1.030)
pH: 5 (ref 5.0–8.0)

## 2022-08-01 LAB — TSH: TSH: 3.813 u[IU]/mL (ref 0.350–4.500)

## 2022-08-01 LAB — T4, FREE: Free T4: 0.95 ng/dL (ref 0.61–1.12)

## 2022-08-01 MED ORDER — POLYETHYLENE GLYCOL 3350 17 G PO PACK
17.0000 g | PACK | Freq: Two times a day (BID) | ORAL | Status: DC
  Administered 2022-08-01 – 2022-08-02 (×3): 17 g via ORAL
  Filled 2022-08-01 (×4): qty 1

## 2022-08-01 MED ORDER — PERFLUTREN LIPID MICROSPHERE
1.0000 mL | INTRAVENOUS | Status: DC | PRN
  Administered 2022-08-01: 3 mL via INTRAVENOUS

## 2022-08-01 MED ORDER — AMIODARONE HCL 200 MG PO TABS
200.0000 mg | ORAL_TABLET | Freq: Every day | ORAL | Status: DC
  Administered 2022-08-01 – 2022-08-03 (×3): 200 mg via ORAL
  Filled 2022-08-01 (×3): qty 1

## 2022-08-01 MED ORDER — SENNOSIDES-DOCUSATE SODIUM 8.6-50 MG PO TABS
2.0000 | ORAL_TABLET | Freq: Two times a day (BID) | ORAL | Status: DC
  Administered 2022-08-01 – 2022-08-02 (×3): 2 via ORAL
  Administered 2022-08-03: 1 via ORAL
  Filled 2022-08-01 (×4): qty 2

## 2022-08-01 NOTE — Progress Notes (Signed)
    Subjective:  Denies SSCP, palpitations or Dyspnea Much better this am Larey Seat off ladder building lean to for His new puppy Buster who he got for father's day  Objective:  Vitals:   08/01/22 0529 08/01/22 0530 08/01/22 0531 08/01/22 0713  BP:    133/86  Pulse: 82 79 81 91  Resp: 18 18 19 20   Temp:    98.5 F (36.9 C)  TempSrc:    Oral  SpO2: 95% 95% 95% 97%  Weight:   93.4 kg   Height:        Intake/Output from previous day:  Intake/Output Summary (Last 24 hours) at 08/01/2022 0905 Last data filed at 08/01/2022 0724 Gross per 24 hour  Intake 1800.98 ml  Output 1500 ml  Net 300.98 ml    Physical Exam:  No distress Lungs clear No murmur  Abdomen benign Post left hip reconstruction dressing dry No edema Palpable pedal pulses   Lab Results: Basic Metabolic Panel: Recent Labs    07/31/22 0106 08/01/22 0233  NA 136 139  K 3.8 3.9  CL 107 107  CO2 24 23  GLUCOSE 117* 126*  BUN 30* 19  CREATININE 1.39* 1.23  CALCIUM 7.7* 8.1*  MG  --  2.0   Liver Function Tests: Recent Labs    07/29/22 1147  AST 32  ALT 25  ALKPHOS 64  BILITOT 0.6  PROT 6.7  ALBUMIN 3.8   No results for input(s): "LIPASE", "AMYLASE" in the last 72 hours. CBC: Recent Labs    07/31/22 0106 08/01/22 0233  WBC 12.1* 9.0  HGB 8.7* 8.3*  HCT 26.0* 24.4*  MCV 97.7 95.7  PLT 156 155   Cardiac Enzymes: No results for input(s): "CKTOTAL", "CKMB", "CKMBINDEX", "TROPONINI" in the last 72 hours. BNP: Invalid input(s): "POCBNP" D-Dimer: Recent Labs    07/31/22 0305  DDIMER 1.87*    Recent Labs    08/01/22 0233  TSH 3.813     Imaging: No results found.  Cardiac Studies:  ECG: post conversion NSR non specific ST changes    Telemetry:  NSR 08/01/2022   Echo: pending  Medications:    enoxaparin (LOVENOX) injection  40 mg Subcutaneous Q24H   pantoprazole  40 mg Oral Daily   polyethylene glycol  17 g Oral BID   rosuvastatin  10 mg Oral Daily   senna-docusate  2 tablet  Oral BID      sodium chloride 75 mL/hr at 08/01/22 0542   amiodarone 30 mg/hr (08/01/22 0422)    Assessment/Plan:  PAF:  In setting of post operative anemia, hip fracture CHADVASC 3 In NSR currently Change to PO amiodarone TTE pending for LA size and EF would start eliquis when ok with orthopedics and Hct deemed stable  HTN:  with postural symptoms and PAF BP improved resume home ARB if not postural again when working with PT/OT  HLD:  continue statin  Ortho: weak and limited weight bearing PT/OT suspect he will need rehab   Charlton Haws 08/01/2022, 9:05 AM

## 2022-08-01 NOTE — Progress Notes (Signed)
*  PRELIMINARY RESULTS* Echocardiogram 2D Echocardiogram has been performed with Definity.  Stacey Drain 08/01/2022, 12:29 PM

## 2022-08-01 NOTE — Progress Notes (Addendum)
[facility] PROGRESS NOTE   Randall Reeves WGN:[id number] DOB: March 07, 1945 DOA: 07/29/2022  PCP: [name], MD  Brief History/Interval Summary: Randall Reeves is a 77 y.o. male with medical history significant for CKD 3A, neuropathy, hypertension, hyperlipidemia, GERD, OSA, CAD, BPH, splenic and renal artery aneurysms presenting after a fall at home. CT of the hip confirmed femur head fracture and also subcapital fracture. Orthopedic consulted and pt underwent intertrochanteric intramedullary nail placement on 07/29/22.   Consultants: Orthopedics  Procedures: ORIF 7/4    Subjective/Interval History: Patient somewhat emotional this morning.  Frustrated by all the medical issues that he has been having.  Patient was told that he appears to be doing well.  He denies any chest pain shortness of breath.  Still has weakness though it appears to be improving.    Assessment/Plan:  Atrial fibrillation with RVR Patient went into tachyarrhythmia on the night of 7/5.  Started on amiodarone infusion.  Seen by cardiology.  Has maintained sinus rhythm for the last 24 hours.  Cardiology has transitioned to oral amiodarone.   Echocardiogram is pending. TSH and free T4 noted to be normal this morning. Cardiology recommends initiating anticoagulation when okay with orthopedics.  Patient did have some drop in hemoglobin but has been stable for the last 24 hours.  If hemoglobin stable tomorrow should be able to initiate Eliquis at that time. Elevated D-dimer of unclear significance.  Follow-up on echocardiogram.    Left femur fracture. Status post ORIF.  PT and OT evaluation.  Pain control.  Bowel regimen.  Chronic kidney disease stage IIIa Stable.  Labs are stable.  Essential hypertension Borderline low blood pressures noted yesterday.  His ARB was held.  Improvement in blood pressure noted.  Continue to monitor.  Leukocytosis Likely reactive.  Resolved as of this morning.    Normocytic  anemia/acute blood loss anemia Most likely due to operative blood loss.  No overt bleeding noted otherwise.  Stable this morning.  Hyperlipidemia Continue statin.  Obstructive sleep apnea CPAP  History of BPH Tamsulosin is listed in his "allergy list" but also on his home medication list.  Will need to clarify.  Constipation Initiate bowel regimen.  History of coronary artery disease Stable.  DVT Prophylaxis: Lovenox Code Status: Full code Family Communication: Discussed with patient Disposition Plan: SNF    Medications: Scheduled:  amiodarone  200 mg Oral Daily   enoxaparin (LOVENOX) injection  40 mg Subcutaneous Q24H   pantoprazole  40 mg Oral Daily   polyethylene glycol  17 g Oral BID   rosuvastatin  10 mg Oral Daily   senna-docusate  2 tablet Oral BID   Continuous:  sodium chloride 75 mL/hr at 08/01/22 0542   HQI:[id number], HYDROmorphone (DILAUDID) injection, menthol-cetylpyridinium **OR** phenol, metoCLOPramide **OR** metoCLOPramide (REGLAN) injection, ondansetron **OR** ondansetron (ZOFRAN) IV, traMADol  Antibiotics: Anti-infectives (From admission, onward)    Start     Dose/Rate Route Frequency Ordered Stop   07/29/22 2300  ceFAZolin (ANCEF) IVPB 2g/100 mL premix        2 g 200 mL/hr over 30 Minutes Intravenous Every 6 hours 07/29/22 1947 07/30/22 2221   07/29/22 1642  ceFAZolin (ANCEF) 2-4 GM/100ML-% IVPB       Note to Pharmacy: Rosiland Oz V: cabinet override      07/29/22 1642 07/30/22 0345       Objective:  Vital Signs  Vitals:   08/01/22 0529 08/01/22 0530 08/01/22 0531 08/01/22 0713  BP:    133/86  Pulse:  82 79 81 91  Resp: 18 18 19 20   Temp:    98.5 F (36.9 C)  TempSrc:    Oral  SpO2: 95% 95% 95% 97%  Weight:   93.4 kg   Height:        Intake/Output Summary (Last 24 hours) at 08/01/2022 1010 Last data filed at 08/01/2022 0724 Gross per 24 hour  Intake 1800.98 ml  Output 1500 ml  Net 300.98 ml    Filed Weights   07/29/22  1150 07/31/22 0326 08/01/22 0531  Weight: 85.7 kg 90.2 kg 93.4 kg    General appearance: Awake alert.  In no distress Resp: Clear to auscultation bilaterally.  Normal effort Cardio: S1-S2 is normal regular.  No S3-S4.  No rubs murmurs or bruit.  Telemetry shows sinus rhythm GI: Abdomen is soft.  Nontender nondistended.  Bowel sounds are present normal.  No masses organomegaly Extremities: No edema.     Lab Results:  Data Reviewed: I have personally reviewed following labs and reports of the imaging studies  CBC: Recent Labs  Lab 07/29/22 1147 07/29/22 1154 07/29/22 2037 07/30/22 0308 07/31/22 0106 08/01/22 0233  WBC 7.4  --  7.1 13.4* 12.1* 9.0  HGB 13.4 13.6 12.1* 10.8* 8.7* 8.3*  HCT 40.2 40.0 36.4* 32.4* 26.0* 24.4*  MCV 95.0  --  97.8 95.0 97.7 95.7  PLT 194  --  187 183 156 155     Basic Metabolic Panel: Recent Labs  Lab 07/29/22 1147 07/29/22 1154 07/29/22 2037 07/30/22 0308 07/31/22 0106 08/01/22 0233  NA 137 138  --  135 136 139  K 4.4 4.5  --  4.0 3.8 3.9  CL 106 109  --  104 107 107  CO2 23  --   --  21* 24 23  GLUCOSE 140* 140*  --  185* 117* 126*  BUN 23 27*  --  24* 30* 19  CREATININE 1.49* 1.50* 1.32* 1.50* 1.39* 1.23  CALCIUM 8.6*  --   --  8.1* 7.7* 8.1*  MG  --   --   --   --   --  2.0     GFR: Estimated Creatinine Clearance: 60.5 mL/min (by C-G formula based on SCr of 1.23 mg/dL).  Liver Function Tests: Recent Labs  Lab 07/29/22 1147  AST 32  ALT 25  ALKPHOS 64  BILITOT 0.6  PROT 6.7  ALBUMIN 3.8     Coagulation Profile: Recent Labs  Lab 07/29/22 1147  INR 1.0      Thyroid Function Tests: Recent Labs    08/01/22 0233  TSH 3.813  FREET4 0.95     Radiology Studies: No results found.     LOS: 2 days   [name] [name]  [facility] Pager on [email or web address]  08/01/2022, 10:10 AM

## 2022-08-01 NOTE — NC FL2 (Signed)
[place] MEDICAID FL2 LEVEL OF CARE FORM     IDENTIFICATION  Patient Name: Randall Reeves Birthdate: 1945/08/19 Sex: male Admission Date (Current Location): 07/29/2022  [facility] and [place] Number:  Producer, television/film/video and Address:  The . [facility], [address], [place], [place] [address]      Provider Number: [id number]  Attending Physician Name and Address:  Osvaldo Shipper, MD  Relative Name and Phone Number:  Bonita Quin (Spouse) [phone]    Current Level of Care: Hospital Recommended Level of Care: Skilled Nursing Facility Prior Approval Number:    Date Approved/Denied:   PASRR Number: [id number]  Discharge Plan: SNF    Current Diagnoses: Patient Active Problem List   Diagnosis Date Noted   PAF (paroxysmal atrial fibrillation) (HCC) 07/31/2022   Hip fracture (HCC) 07/30/2022   Closed comminuted intertrochanteric fracture of left femur (HCC) 07/29/2022   Aortic atherosclerosis (HCC) 05/16/2020   Stage 3a chronic kidney disease (HCC) 12/28/2015   Renal artery aneurysm (HCC) 01/14/2015   Peripheral neuropathy 05/22/2014   Advanced directives, counseling/discussion 01/08/2014   BPH with obstruction/lower urinary tract symptoms 01/08/2014   Splenic artery aneurysm (HCC) 11/25/2012   Routine general medical examination at a health care facility 10/20/2010   Coronary artery disease involving native coronary artery of native heart without angina pectoris 10/18/2009   Hyperlipidemia with target LDL less than 70 05/14/2006   Obstructive sleep apnea 05/14/2006   Essential hypertension, benign 05/14/2006   ALLERGIC RHINITIS 05/14/2006   GERD 05/14/2006    Orientation RESPIRATION BLADDER Height & Weight     Self, Time, Situation, Place  Normal Continent Weight: 205 lb 14.6 oz (93.4 kg) Height:  5\' 10"  (177.8 cm)  BEHAVIORAL SYMPTOMS/MOOD NEUROLOGICAL BOWEL NUTRITION STATUS      Continent (WDL) Diet (Please see discharge summary)  AMBULATORY STATUS  COMMUNICATION OF NEEDS Skin   Limited Assist Verbally Other (Comment) (Dry,Ecchymosis,hip,Left,Wound/Incision LDAs,Incision Closed,other,Left,foam-lift dressing)                       Personal Care Assistance Level of Assistance  Bathing, Feeding, Dressing Bathing Assistance: Maximum assistance Feeding assistance: Limited assistance Dressing Assistance: Maximum assistance     Functional Limitations Info  Sight, Hearing, Speech Sight Info: Impaired Financial trader) Hearing Info: Adequate Speech Info: Adequate    SPECIAL CARE FACTORS FREQUENCY  PT (By licensed PT), OT (By licensed OT)     PT Frequency: 5x min weekly OT Frequency: 5x min weekly            Contractures Contractures Info: Not present    Additional Factors Info  Code Status, Allergies Code Status Info: FULL Allergies Info: Bisoprolol-hydrochlorothiazide,Codeine,Imipramine,Tamsulosin           Current Medications (08/01/2022):  This is the current hospital active medication list Current Facility-Administered Medications  Medication Dose Route Frequency Provider Last Rate Last Admin   0.9 %  sodium chloride infusion   Intravenous Continuous Kathlen Mody, MD 75 mL/hr at 08/01/22 0542 New Bag at 08/01/22 0542   acetaminophen (TYLENOL) tablet 650 mg  650 mg Oral Q4H PRN Kathlen Mody, MD   650 mg at 08/01/22 0418   amiodarone (NEXTERONE PREMIX) 360-4.14 MG/200ML-% (1.8 mg/mL) IV infusion  30 mg/hr Intravenous Continuous Anthoney Harada, NP 16.67 mL/hr at 08/01/22 0422 30 mg/hr at 08/01/22 0422   enoxaparin (LOVENOX) injection 40 mg  40 mg Subcutaneous Q24H Yolonda Kida, MD   40 mg at 08/01/22 0901   HYDROmorphone (DILAUDID) injection  1 mg  1 mg Intravenous Q2H PRN Yolonda Kida, MD   1 mg at 07/31/22 4098   menthol-cetylpyridinium (CEPACOL) lozenge 3 mg  1 lozenge Oral PRN Yolonda Kida, MD       Or   phenol [facility]) mouth spray 1 spray  1 spray Mouth/Throat PRN Yolonda Kida, MD       metoCLOPramide [facility]) tablet 5-10 mg  5-10 mg Oral Q8H PRN Yolonda Kida, MD       Or   metoCLOPramide (REGLAN) injection 5-10 mg  5-10 mg Intravenous Q8H PRN Yolonda Kida, MD       ondansetron [facility]) tablet 4 mg  4 mg Oral Q6H PRN Yolonda Kida, MD       Or   ondansetron [facility]) injection 4 mg  4 mg Intravenous Q6H PRN Yolonda Kida, MD       pantoprazole (PROTONIX) EC tablet 40 mg  40 mg Oral Daily Yolonda Kida, MD   40 mg at 08/01/22 0900   polyethylene glycol (MIRALAX / GLYCOLAX) packet 17 g  17 g Oral BID Osvaldo Shipper, MD   17 g at 08/01/22 0900   rosuvastatin (CRESTOR) tablet 10 mg  10 mg Oral Daily Yolonda Kida, MD   10 mg at 08/01/22 0901   senna-docusate (Senokot-S) tablet 2 tablet  2 tablet Oral BID Osvaldo Shipper, MD   2 tablet at 08/01/22 0901   traMADol (ULTRAM) tablet 50 mg  50 mg Oral Q12H PRN Kathlen Mody, MD   50 mg at 07/30/22 2221     Discharge Medications: Please see discharge summary for a list of discharge medications.  Relevant Imaging Results:  Relevant Lab Results:   Additional Information [id number]  Delilah Shan, LCSWA

## 2022-08-01 NOTE — TOC Initial Note (Signed)
Transition of Care [facility]) - Initial/Assessment Note    Patient Details  Name: Randall Reeves MRN: [id number] Date of Birth: [birth year]  [facility]) CM/SW Contact:    Delilah Shan, LCSWA Phone Number: 08/01/2022, 8:44 AM  Clinical Narrative:                  CSW received consult for possible SNF placement at time of discharge. CSW spoke with patient regarding PT recommendation of SNF placement at time of discharge. Patient reports PTA patient comes from home with spouse. Patient expressed understanding of PT recommendation and is agreeable to SNF placement at time of discharge. Patient gave CSW permission to fax patient out for SNF placement.CSW discussed insurance authorization process with patient. No further questions reported at this time. CSW to continue to follow and assist with discharge planning needs.    Expected Discharge Plan: Skilled Nursing Facility Barriers to Discharge: Continued Medical Work up   Patient Goals and CMS Choice Patient states their goals for this hospitalization and ongoing recovery are:: SNF   Choice offered to / list presented to : Patient      Expected Discharge Plan and Services In-house Referral: Clinical Social Work                                            Prior Living Arrangements/Services   Lives with:: Spouse Patient language and need for interpreter reviewed:: Yes Do you feel safe going back to the place where you live?: No   SNF  Need for Family Participation in Patient Care: Yes (Comment) Care giver support system in place?: Yes (comment)   Criminal Activity/Legal Involvement Pertinent to Current Situation/Hospitalization: No - Comment as needed  Activities of Daily Living Home Assistive Devices/Equipment: None ADL Screening (condition at time of admission) Patient's cognitive ability adequate to safely complete daily activities?: Yes Is the patient deaf or have difficulty hearing?: No Does the patient have  difficulty seeing, even when wearing glasses/contacts?: No Does the patient have difficulty concentrating, remembering, or making decisions?: No Patient able to express need for assistance with ADLs?: Yes Does the patient have difficulty dressing or bathing?: No Independently performs ADLs?: Yes (appropriate for developmental age) Does the patient have difficulty walking or climbing stairs?: No Weakness of Legs: None Weakness of Arms/Hands: None  Permission Sought/Granted Permission sought to share information with : Case Manager, Magazine features editor, Family Supports Permission granted to share information with : Yes, Verbal Permission Granted  Share Information with NAME: Bonita Quin  Permission granted to share info w AGENCY: SNF  Permission granted to share info w Relationship: spouse  Permission granted to share info w Contact Information: Bonita Quin [phone]  Emotional Assessment Appearance:: Appears stated age Attitude/Demeanor/Rapport: Gracious Affect (typically observed): Calm Orientation: : Oriented to Self, Oriented to Place, Oriented to  Time, Oriented to Situation Alcohol / Substance Use: Not Applicable Psych Involvement: No (comment)  Admission diagnosis:  Closed fracture of left hip, initial encounter (HCC) [S72.002A] Fall, initial encounter [W19.XXXA] Closed comminuted intertrochanteric fracture of left femur (HCC) [S72.142A] Hip fracture (HCC) [S72.009A] Patient Active Problem List   Diagnosis Date Noted   PAF (paroxysmal atrial fibrillation) (HCC) 07/31/2022   Hip fracture (HCC) 07/30/2022   Closed comminuted intertrochanteric fracture of left femur (HCC) 07/29/2022   Aortic atherosclerosis (HCC) 05/16/2020   Stage 3a chronic kidney disease (HCC) 12/28/2015   Renal  artery aneurysm (HCC) 01/14/2015   Peripheral neuropathy 05/22/2014   Advanced directives, counseling/discussion 01/08/2014   BPH with obstruction/lower urinary tract symptoms 01/08/2014    Splenic artery aneurysm (HCC) 11/25/2012   Routine general medical examination at a health care facility 10/20/2010   Coronary artery disease involving native coronary artery of native heart without angina pectoris 10/18/2009   Hyperlipidemia with target LDL less than 70 05/14/2006   Obstructive sleep apnea 05/14/2006   Essential hypertension, benign 05/14/2006   ALLERGIC RHINITIS 05/14/2006   GERD 05/14/2006   PCP:  [name], MD Pharmacy:   [facility] - [place], [place] - [address] [place] [address] Phone: [phone] Fax: [phone]  [id number] [place], [place] - [address] AT [employer] OF [address] [place] [address] Phone: [phone] Fax: [phone]  [id number] - [place], [place] - [address] AT [facility] [address] [facility] [address] [place] [address] Phone: [phone] Fax: [phone]  [facility] [place] - [place], [place] - [address] [place] [address] Phone: [phone] Fax: [phone]     Social Determinants of Health (SDOH) Social History: SDOH Screenings   Food Insecurity: No Food Insecurity (07/31/2022)  Housing: Low Risk  (07/31/2022)  Transportation Needs: No Transportation Needs (07/31/2022)  Utilities: Not At Risk (07/31/2022)  Depression (PHQ2-9): Low Risk  (05/27/2022)  Tobacco Use: Low Risk  (07/31/2022)   SDOH Interventions:     Readmission Risk Interventions     No data to display

## 2022-08-01 NOTE — Progress Notes (Signed)
   08/01/22 0128  Spiritual Encounters  Type of Visit Initial  Care provided to: Patient  Conversation partners present during encounter Nurse  Referral source Nurse (RN/NT/LPN)  Reason for visit Routine spiritual support  OnCall Visit Yes  Spiritual Framework  Presenting Themes Other (comment)   Received call patient requesting a Bible. Bible presented to patient.

## 2022-08-02 ENCOUNTER — Other Ambulatory Visit (HOSPITAL_COMMUNITY): Payer: Self-pay

## 2022-08-02 DIAGNOSIS — S72142D Displaced intertrochanteric fracture of left femur, subsequent encounter for closed fracture with routine healing: Secondary | ICD-10-CM | POA: Diagnosis not present

## 2022-08-02 DIAGNOSIS — I10 Essential (primary) hypertension: Secondary | ICD-10-CM | POA: Diagnosis not present

## 2022-08-02 DIAGNOSIS — I480 Paroxysmal atrial fibrillation: Secondary | ICD-10-CM | POA: Diagnosis not present

## 2022-08-02 DIAGNOSIS — D649 Anemia, unspecified: Secondary | ICD-10-CM | POA: Diagnosis not present

## 2022-08-02 LAB — BASIC METABOLIC PANEL
Anion gap: 11 (ref 5–15)
BUN: 17 mg/dL (ref 8–23)
CO2: 23 mmol/L (ref 22–32)
Calcium: 8.8 mg/dL — ABNORMAL LOW (ref 8.9–10.3)
Chloride: 104 mmol/L (ref 98–111)
Creatinine, Ser: 1.27 mg/dL — ABNORMAL HIGH (ref 0.61–1.24)
GFR, Estimated: 59 mL/min — ABNORMAL LOW (ref >60)
Glucose, Bld: 109 mg/dL — ABNORMAL HIGH (ref 70–99)
Potassium: 3.5 mmol/L (ref 3.5–5.1)
Sodium: 138 mmol/L (ref 135–145)

## 2022-08-02 LAB — CBC
HCT: 27.5 % — ABNORMAL LOW (ref 39.0–52.0)
Hemoglobin: 9.3 g/dL — ABNORMAL LOW (ref 13.0–17.0)
MCH: 31.8 pg (ref 26.0–34.0)
MCHC: 33.8 g/dL (ref 30.0–36.0)
MCV: 94.2 fL (ref 80.0–100.0)
Platelets: 234 10*3/uL (ref 150–400)
RBC: 2.92 MIL/uL — ABNORMAL LOW (ref 4.22–5.81)
RDW: 12.4 % (ref 11.5–15.5)
WBC: 8.3 10*3/uL (ref 4.0–10.5)
nRBC: 0.2 % (ref 0.0–0.2)

## 2022-08-02 MED ORDER — TRAMADOL HCL 50 MG PO TABS: 50.0000 mg | ORAL_TABLET | Freq: Four times a day (QID) | ORAL | 0 refills | Status: DC | PRN

## 2022-08-02 MED ORDER — APIXABAN 5 MG PO TABS
5.0000 mg | ORAL_TABLET | Freq: Two times a day (BID) | ORAL | Status: DC
  Administered 2022-08-02 – 2022-08-03 (×3): 5 mg via ORAL
  Filled 2022-08-02 (×3): qty 1

## 2022-08-02 MED ORDER — HYDROCODONE-ACETAMINOPHEN 5-325 MG PO TABS: 1.0000 | ORAL_TABLET | Freq: Four times a day (QID) | ORAL | 0 refills | Status: DC | PRN

## 2022-08-02 NOTE — Progress Notes (Signed)
ANTICOAGULATION CONSULT NOTE  Pharmacy Consult for apixaban Indication: atrial fibrillation  Allergies  Allergen Reactions   Bisoprolol-Hydrochlorothiazide     REACTION: muscle pain, cramps   Codeine Nausea Only and Itching   Imipramine Other (See Comments)    Dry mouth, dizziness, headaches, increased sweating    Tamsulosin Other (See Comments)    Dizziness     Patient Measurements: Height: 5\' 10"  (177.8 cm) Weight: 93.4 kg (205 lb 14.6 oz) IBW/kg (Calculated) : 73  Vital Signs: Temp: 98.1 F (36.7 C) (07/08 1123) Temp Source: Oral (07/08 1123) BP: 139/85 (07/08 1123) Pulse Rate: 88 (07/08 1123)  Labs: Recent Labs    07/31/22 0106 07/31/22 0305 08/01/22 0233 08/02/22 0946  HGB 8.7*  --  8.3* 9.3*  HCT 26.0*  --  24.4* 27.5*  PLT 156  --  155 234  CREATININE 1.39*  --  1.23 1.27*  TROPONINIHS  --  9  --   --     Estimated Creatinine Clearance: 58.6 mL/min (A) (by C-G formula based on SCr of 1.27 mg/dL (H)).   Medical History: Past Medical History:  Diagnosis Date   Allergy    Arthritis    Cataract    CKD (chronic kidney disease), stage III (HCC)    Coronary artery disease, non-occlusive 09/2009   Minimal CAD / no flow-limiting lesions on Cath; CPX February 2022: 1200 exercise, reached 91% image PRN.  Peak VO2 26.5-normal= excellent functional capacity.  High anaerobic threshold suggest excellent trending effect.  At peak exercise, limited by obesity.:  Normal CPX   GERD (gastroesophageal reflux disease)    Hyperlipidemia    Hypertension    Myocardial infarction - Type 2a MI 2015    Felt to be Type II MI-demand infarct   Neuromuscular disorder (HCC)    Obstructive sleep apnea    Cpap as of 27 days ago   Sleep apnea    wears cpap    Syncope    due to hypotension from BP meds       Assessment: 40 yoM admitted with femur fracture s/p repair 7/4. Pt with new AFib to start apixaban. Wt 93kg, age 77, Cr 1.27 - qualifies for 5mg  BID dose.   Plan:   Apixaban 5mg  BID Pharmacy will sign off, reconsult if needed  Fredonia Highland, PharmD, BCPS, [facility] Clinical Pharmacist [phone] Please check AMION for all [facility] numbers 08/02/2022

## 2022-08-02 NOTE — TOC Progression Note (Addendum)
Transition of Care [facility]) - Progression Note    Patient Details  Name: Randall Reeves MRN: [id number] Date of Birth: Dec 15, 1945  Transition of Care [facility]) CM/SW Contact  Delilah Shan, LCSWA Phone Number: 08/02/2022, 10:18 AM  Clinical Narrative:     Update- CSW received call back from [place] with [facility] who confirmed SNF bed offer. CSW spoke with Tammy with [facility] and started insurance authorization for patient for SNF and PTAR. Insurance authorization for SNF and PTAR currently pending. Patients spouse reports patient is supposed to be receiving new Cpap today. Spouse confirmed once received she will make sure Cpap is brought from home to facility to SNF for patient. CSW informed patients spouse if they do not receive tomorrow , so that CSW can order cpap through facility for patient, for patient to have at SNF.  CSW spoke with patient at bedside and provided medicare compare SNF ratings list with accepted SNF bed offers. Patient reviewed and accepted SNF bed offer with [facility]. Patient requested CSW to follow up with his spouse on his SNF choice and plan. CSW called Sue Lush with [facility] and LVM. CSW awaiting call back to confirm SNF bed for patient. CSW updated patients spouse. CSW will continue to follow.   Expected Discharge Plan: Skilled Nursing Facility Barriers to Discharge: Continued Medical Work up  Expected Discharge Plan and Services In-house Referral: Clinical Social Work                                             Social Determinants of Health (SDOH) Interventions SDOH Screenings   Food Insecurity: No Food Insecurity (07/31/2022)  Housing: Low Risk  (07/31/2022)  Transportation Needs: No Transportation Needs (07/31/2022)  Utilities: Not At Risk (07/31/2022)  Depression (PHQ2-9): Low Risk  (05/27/2022)  Tobacco Use: Low Risk  (07/31/2022)    Readmission Risk Interventions     No data to display

## 2022-08-02 NOTE — Care Management Important Message (Signed)
Important Message  Patient Details  Name: Randall Reeves MRN: [id number] Date of Birth: 1946/01/13   Medicare Important Message Given:  Yes     Renie Ora 08/02/2022, 8:36 AM

## 2022-08-02 NOTE — TOC Benefit Eligibility Note (Signed)
Pharmacy Patient Advocate Encounter  Insurance verification completed.    The patient is insured through [employer]/ [employer]    Ran test claim for Eliquis 5mg  and the current 30 day co-pay is $47.00.   This test claim was processed through [facility]- copay amounts [place] vary at other pharmacies due to pharmacy/plan contracts, or as the patient moves through the different stages of their insurance plan.

## 2022-08-02 NOTE — Progress Notes (Signed)
[facility] PROGRESS NOTE   Phyllip Itzkowitz [place] ZOX:[id number] DOB: Gottsch 16, 1947 DOA: 07/29/2022  PCP: [name], MD  Brief History/Interval Summary: Taevian Regehr Singley is a 77 y.o. male with medical history significant for CKD 3A, neuropathy, hypertension, hyperlipidemia, GERD, OSA, CAD, BPH, splenic and renal artery aneurysms presenting after a fall at home. CT of the hip confirmed femur head fracture and also subcapital fracture. Orthopedic consulted and pt underwent intertrochanteric intramedullary nail placement on 07/29/22.   Consultants: Orthopedics  Procedures: ORIF 7/4    Subjective/Interval History: Patient sitting up in the chair eating his breakfast.  Noted to be in better spirits this morning compared to yesterday.  Denies any chest pain shortness of breath.  Slept well.  Bowel movement.    Assessment/Plan:  Atrial fibrillation with RVR Patient went into tachyarrhythmia on the night of 7/5.  Started on amiodarone infusion.  Seen by cardiology.  Patient converted to sinus rhythm.  Was transitioned over to oral amiodarone. Echocardiogram shows normal systolic function.   Discussed with orthopedics.  Okay to initiate apixaban.  Labs are pending from this morning.  If hemoglobin is stable will initiate apixaban.   TSH and free T4 noted to be normal this morning. Elevated D-dimer of unclear significance.  Likely due to all of his acute medical issues.  Right ventricular function was noted to be normal on echo.  No further testing for elevated D-dimer.  Left femur fracture. Status post ORIF.  Pain control.  Bowel regimen.  Stably well-controlled.  Seen by PT and OT.  SNF is recommended for short-term rehab.  Chronic kidney disease stage IIIa Stable.  Labs are stable.  Essential hypertension The patient had borderline low blood pressures which has stabilized.  ARB was held.  Await further input from cardiology.    Leukocytosis Likely reactive.  Resolved.  Normocytic  anemia/acute blood loss anemia Most likely due to operative blood loss.  No overt bleeding noted otherwise.  Hemoglobin has been stable.  Will recheck labs today.  Hyperlipidemia Continue statin.  Obstructive sleep apnea CPAP  History of BPH Tamsulosin is listed in his "allergy list" but also on his home medication list.  Currently on hold  Constipation He mentioned that he had a bowel movement.  Continue with bowel regimen.  History of coronary artery disease Stable.  DVT Prophylaxis: Lovenox Code Status: Full code Family Communication: Discussed with patient Disposition Plan: SNF    Medications: Scheduled:  amiodarone  200 mg Oral Daily   enoxaparin (LOVENOX) injection  40 mg Subcutaneous Q24H   pantoprazole  40 mg Oral Daily   polyethylene glycol  17 g Oral BID   rosuvastatin  10 mg Oral Daily   senna-docusate  2 tablet Oral BID   Continuous:   WJX:BJYNWGNFAOZHY, menthol-cetylpyridinium **OR** phenol, metoCLOPramide **OR** metoCLOPramide (REGLAN) injection, ondansetron **OR** ondansetron (ZOFRAN) IV, traMADol  Antibiotics: Anti-infectives (From admission, onward)    Start     Dose/Rate Route Frequency Ordered Stop   07/29/22 2300  ceFAZolin (ANCEF) IVPB 2g/100 mL premix        2 g 200 mL/hr over 30 Minutes Intravenous Every 6 hours 07/29/22 1947 07/30/22 2221   07/29/22 1642  ceFAZolin (ANCEF) 2-4 GM/100ML-% IVPB       Note to Pharmacy: Rosiland Oz V: cabinet override      07/29/22 1642 07/30/22 0345       Objective:  Vital Signs  Vitals:   08/02/22 0022 08/02/22 0507 08/02/22 0522 08/02/22 0755  BP: 135/84  136/81 138/82  Pulse: 96  87 92  Resp: 16 16 19 18   Temp: 98.2 F (36.8 C)  98.4 F (36.9 C) 98 F (36.7 C)  TempSrc: Oral  Oral Oral  SpO2: 96%  95% 96%  Weight:      Height:        Intake/Output Summary (Last 24 hours) at 08/02/2022 0953 Last data filed at 08/02/2022 1610 Gross per 24 hour  Intake 850 ml  Output 3000 ml  Net -2150  ml    Filed Weights   07/29/22 1150 07/31/22 0326 08/01/22 0531  Weight: 85.7 kg 90.2 kg 93.4 kg    General appearance: Awake alert.  In no distress Resp: Clear to auscultation bilaterally.  Normal effort Cardio: S1-S2 is normal regular.  No S3-S4.  No rubs murmurs or bruit GI: Abdomen is soft.  Nontender nondistended.  Bowel sounds are present normal.  No masses organomegaly Extremities: No edema.     Lab Results:  Data Reviewed: I have personally reviewed following labs and reports of the imaging studies  CBC: Recent Labs  Lab 07/29/22 1147 07/29/22 1154 07/29/22 2037 07/30/22 0308 07/31/22 0106 08/01/22 0233  WBC 7.4  --  7.1 13.4* 12.1* 9.0  HGB 13.4 13.6 12.1* 10.8* 8.7* 8.3*  HCT 40.2 40.0 36.4* 32.4* 26.0* 24.4*  MCV 95.0  --  97.8 95.0 97.7 95.7  PLT 194  --  187 183 156 155     Basic Metabolic Panel: Recent Labs  Lab 07/29/22 1147 07/29/22 1154 07/29/22 2037 07/30/22 0308 07/31/22 0106 08/01/22 0233  NA 137 138  --  135 136 139  K 4.4 4.5  --  4.0 3.8 3.9  CL 106 109  --  104 107 107  CO2 23  --   --  21* 24 23  GLUCOSE 140* 140*  --  185* 117* 126*  BUN 23 27*  --  24* 30* 19  CREATININE 1.49* 1.50* 1.32* 1.50* 1.39* 1.23  CALCIUM 8.6*  --   --  8.1* 7.7* 8.1*  MG  --   --   --   --   --  2.0     GFR: Estimated Creatinine Clearance: 60.5 mL/min (by C-G formula based on SCr of 1.23 mg/dL).  Liver Function Tests: Recent Labs  Lab 07/29/22 1147  AST 32  ALT 25  ALKPHOS 64  BILITOT 0.6  PROT 6.7  ALBUMIN 3.8     Coagulation Profile: Recent Labs  Lab 07/29/22 1147  INR 1.0      Thyroid Function Tests: Recent Labs    08/01/22 0233  TSH 3.813  FREET4 0.95     Radiology Studies: ECHOCARDIOGRAM COMPLETE  Result Date: 08/01/2022    ECHOCARDIOGRAM REPORT   Patient Name:   MONTRAY BOCCUZZI Meiring Date of Exam: 08/01/2022 Medical Rec #:  [id number]  Height:       70.0 in Accession #:    [id number] Weight:       205.9 lb Date of Birth:   Apr 11, 1945  BSA:          2.113 m Patient Age:    76 years   BP:           133/86 mmHg Patient Gender: M          HR:           83 bpm. Exam Location:  Inpatient Procedure: 2D Echo, Cardiac Doppler and Color Doppler Indications:    Atrial Flutter I48.92  History:  Patient has prior history of Echocardiogram examinations, most                 recent 12/29/2015. CAD, Arrythmias:Atrial Fibrillation; Risk                 Factors:Hypertension and Dyslipidemia.  Sonographer:    Celesta Gentile RCS Referring Phys: [phone] Osvaldo Shipper IMPRESSIONS  1. Left ventricular ejection fraction, by estimation, is 60 to 65%. The left ventricle has normal function. The left ventricle has no regional wall motion abnormalities. Left ventricular diastolic parameters are consistent with Grade I diastolic dysfunction (impaired relaxation).  2. Right ventricular systolic function is normal. The right ventricular size is normal. Tricuspid regurgitation signal is inadequate for assessing PA pressure.  3. The mitral valve is grossly normal. Trivial mitral valve regurgitation.  4. The aortic valve is tricuspid. Aortic valve regurgitation is not visualized.  5. The inferior vena cava is normal in size with greater than 50% respiratory variability, suggesting right atrial pressure of 3 mmHg. Comparison(s): Prior images unable to be directly viewed, comparison made by report only. FINDINGS  Left Ventricle: Left ventricular ejection fraction, by estimation, is 60 to 65%. The left ventricle has normal function. The left ventricle has no regional wall motion abnormalities. Definity contrast agent was given IV to delineate the left ventricular  endocardial borders. The left ventricular internal cavity size was normal in size. There is no left ventricular hypertrophy. Left ventricular diastolic parameters are consistent with Grade I diastolic dysfunction (impaired relaxation). Right Ventricle: The right ventricular size is normal. No increase in  right ventricular wall thickness. Right ventricular systolic function is normal. Tricuspid regurgitation signal is inadequate for assessing PA pressure. Left Atrium: Left atrial size was normal in size. Right Atrium: Right atrial size was normal in size. Pericardium: There is no evidence of pericardial effusion. Mitral Valve: The mitral valve is grossly normal. Mild mitral annular calcification. Trivial mitral valve regurgitation. Tricuspid Valve: The tricuspid valve is grossly normal. Tricuspid valve regurgitation is trivial. Aortic Valve: The aortic valve is tricuspid. There is mild aortic valve annular calcification. Aortic valve regurgitation is not visualized. Pulmonic Valve: The pulmonic valve was not well visualized. Pulmonic valve regurgitation is trivial. Aorta: The aortic root is normal in size and structure. Venous: The inferior vena cava is normal in size with greater than 50% respiratory variability, suggesting right atrial pressure of 3 mmHg. IAS/Shunts: No atrial level shunt detected by color flow Doppler.  LEFT VENTRICLE PLAX 2D LVIDd:         4.30 cm   Diastology LVIDs:         2.70 cm   LV e' medial:    8.59 cm/s LV PW:         0.80 cm   LV E/e' medial:  10.6 LV IVS:        0.90 cm   LV e' lateral:   9.68 cm/s LVOT diam:     1.90 cm   LV E/e' lateral: 9.4 LV SV:         55 LV SV Index:   26 LVOT Area:     2.84 cm  RIGHT VENTRICLE RV S prime:     20.10 cm/s TAPSE (M-mode): 2.9 cm LEFT ATRIUM             Index        RIGHT ATRIUM           Index LA diam:        2.90  cm 1.37 cm/m   RA Area:     19.50 cm LA Vol (A2C):   68.4 ml 32.37 ml/m  RA Volume:   56.00 ml  26.50 ml/m LA Vol (A4C):   60.7 ml 28.72 ml/m LA Biplane Vol: 64.2 ml 30.38 ml/m  AORTIC VALVE LVOT Vmax:   102.00 cm/s LVOT Vmean:  72.300 cm/s LVOT VTI:    0.195 m  AORTA Ao Root diam: 3.17 cm MITRAL VALVE MV Area (PHT): 2.95 cm     SHUNTS MV Decel Time: 257 msec     Systemic VTI:  0.20 m MV E velocity: 91.20 cm/s   Systemic Diam:  1.90 cm MV A velocity: 136.00 cm/s MV E/A ratio:  0.67 Nona Dell MD Electronically signed by Nona Dell MD Signature Date/Time: 08/01/2022/1:04:18 PM    Final        LOS: 3 days   Osvaldo Shipper  [facility] Pager on [email or web address]  08/02/2022, 9:53 AM

## 2022-08-02 NOTE — Progress Notes (Signed)
Physical Therapy Treatment Patient Details Name: Randall Reeves MRN: [id number] DOB: 02-03-1945 Today's Date: 08/02/2022   History of Present Illness 77 y.o. male presenting to ED after a fall onto L hip. S/p L IM nail, PWB 50%. PMH:CKD 3A, neuropathy, hypertension, hyperlipidemia, GERD, OSA, CAD, BPH, splenic and renal artery aneurysms    PT Comments  Pt received in supine and agreeable to session. Pt demonstrating improved bed mobility, requiring min A to advance LLE to EOB and increased time. Pt continuing to require mod A to stand due to weakness and little anterior lean despite cues. Pt able to tolerate increased gait distance demonstrating slow, short steps. Pt with improved BUE support on RW to maintain WB precautions this session. Pt limited by decreased activity tolerance and weakness. Pt continues to benefit from PT services to progress toward functional mobility goals.     Assistance Recommended at Discharge Frequent or constant Supervision/Assistance  If plan is discharge home, recommend the following:  Can travel by private vehicle    Two people to help with walking and/or transfers;A lot of help with bathing/dressing/bathroom;Assistance with cooking/housework;Assist for transportation;Help with stairs or ramp for entrance   No  Equipment Recommendations  Rolling walker (2 wheels);BSC/3in1    Recommendations for Other Services       Precautions / Restrictions Precautions Precautions: Fall Precaution Comments: fell from ladder Restrictions Weight Bearing Restrictions: Yes LLE Weight Bearing: Partial weight bearing LLE Partial Weight Bearing Percentage or Pounds: 50     Mobility  Bed Mobility Overal bed mobility: Needs Assistance Bed Mobility: Supine to Sit     Supine to sit: HOB elevated, Min assist     General bed mobility comments: increased time and min A to elevate LLE off of bed for pt to advance    Transfers Overall transfer level: Needs  assistance Equipment used: Rolling walker (2 wheels) Transfers: Sit to/from Stand Sit to Stand: From elevated surface, Mod assist           General transfer comment: Mod A for power up. Cues for anterior lean    Ambulation/Gait Ambulation/Gait assistance: Min guard Gait Distance (Feet): 15 Feet Assistive device: Rolling walker (2 wheels) Gait Pattern/deviations: Step-to pattern, Antalgic, Trunk flexed       General Gait Details: Pt demonstrating slow steps with limited B foot clearance and heavy reliance on BUE to offload LLE and maintain PWB. Cues for upright posture and increased foot clearance      Balance Overall balance assessment: Needs assistance Sitting-balance support: No upper extremity supported, Feet supported Sitting balance-Leahy Scale: Fair Sitting balance - Comments: sitting EOB   Standing balance support: Bilateral upper extremity supported, During functional activity, Reliant on assistive device for balance Standing balance-Leahy Scale: Poor Standing balance comment: with RW support                            Cognition Arousal/Alertness: Awake/alert Behavior During Therapy: WFL for tasks assessed/performed Overall Cognitive Status: Within Functional Limits for tasks assessed                                          Exercises      General Comments        Pertinent Vitals/Pain Pain Assessment Pain Assessment: Faces Faces Pain Scale: Hurts little more Pain Location: L LE with mobility Pain Descriptors / Indicators:  Guarding, Grimacing Pain Intervention(s): Monitored during session, Repositioned     PT Goals (current goals can now be found in the care plan section) Acute Rehab PT Goals Patient Stated Goal: go home PT Goal Formulation: With patient/family Time For Goal Achievement: 08/13/22 Potential to Achieve Goals: Fair Progress towards PT goals: Progressing toward goals    Frequency    Min  5X/week      PT Plan Current plan remains appropriate       AM-PAC PT "6 Clicks" Mobility   Outcome Measure  Help needed turning from your back to your side while in a flat bed without using bedrails?: A Little Help needed moving from lying on your back to sitting on the side of a flat bed without using bedrails?: A Little Help needed moving to and from a bed to a chair (including a wheelchair)?: A Little Help needed standing up from a chair using your arms (e.g., wheelchair or bedside chair)?: A Lot Help needed to walk in hospital room?: A Little Help needed climbing 3-5 steps with a railing? : Total 6 Click Score: 15    End of Session Equipment Utilized During Treatment: Gait belt Activity Tolerance: Patient tolerated treatment well Patient left: in chair;with call bell/phone within reach;with family/visitor present Nurse Communication: Mobility status PT Visit Diagnosis: Unsteadiness on feet (R26.81);History of falling (Z91.81);Muscle weakness (generalized) (M62.81);Dizziness and giddiness (R42);Pain;Difficulty in walking, not elsewhere classified (R26.2) Pain - Right/Left: Left Pain - part of body: Hip     Time: 2956-2130 PT Time Calculation (min) (ACUTE ONLY): 25 min  Charges:    $Gait Training: 8-22 mins $Therapeutic Activity: 8-22 mins PT General Charges $$ ACUTE PT VISIT: 1 Visit                     Johny Shock, PTA Acute Rehabilitation Services Secure Chat Preferred  [phone]    Johny Shock 08/02/2022, 2:13 PM

## 2022-08-02 NOTE — Progress Notes (Addendum)
Progress Note  Patient Name: Randall Reeves Date of Encounter: 08/02/2022  Primary Cardiologist: Bryan Lemma, MD  Subjective   No CP, SOB, palpitations reported today. Remains in NSR.  Inpatient Medications    Scheduled Meds:  amiodarone  200 mg Oral Daily   enoxaparin (LOVENOX) injection  40 mg Subcutaneous Q24H   pantoprazole  40 mg Oral Daily   polyethylene glycol  17 g Oral BID   rosuvastatin  10 mg Oral Daily   senna-docusate  2 tablet Oral BID   Continuous Infusions:  PRN Meds: acetaminophen, menthol-cetylpyridinium **OR** phenol, metoCLOPramide **OR** metoCLOPramide (REGLAN) injection, ondansetron **OR** ondansetron (ZOFRAN) IV, traMADol   Vital Signs    Vitals:   08/02/22 0022 08/02/22 0507 08/02/22 0522 08/02/22 0755  BP: 135/84  136/81 138/82  Pulse: 96  87 92  Resp: 16 16 19 18   Temp: 98.2 F (36.8 C)  98.4 F (36.9 C) 98 F (36.7 C)  TempSrc: Oral  Oral Oral  SpO2: 96%  95% 96%  Weight:      Height:        Intake/Output Summary (Last 24 hours) at 08/02/2022 0854 Last data filed at 08/02/2022 0829 Gross per 24 hour  Intake 850 ml  Output 2550 ml  Net -1700 ml      08/01/2022    5:31 AM 07/31/2022    3:26 AM 07/29/2022   11:50 AM  Last 3 Weights  Weight (lbs) 205 lb 14.6 oz 198 lb 13.7 oz 188 lb 15 oz  Weight (kg) 93.4 kg 90.2 kg 85.7 kg     Telemetry    NSR - Personally Reviewed  Physical Exam   GEN: No acute distress.  HEENT: Normocephalic, atraumatic, sclera non-icteric. Neck: No JVD or bruits. Cardiac: RRR no murmurs, rubs, or gallops.  Respiratory: Clear to auscultation bilaterally. Breathing is unlabored. GI: Soft, nontender, non-distended, BS +x 4. MS: no deformity. Extremities: No clubbing or cyanosis. No edema. Distal pedal pulses are 2+ and equal bilaterally. Neuro:  AAOx3. Follows commands. Psych:  Responds to questions appropriately with a normal affect.  Labs    High Sensitivity Troponin:   Recent Labs  Lab 07/31/22 0305   TROPONINIHS 9      Cardiac EnzymesNo results for input(s): "TROPONINI" in the last 168 hours. No results for input(s): "TROPIPOC" in the last 168 hours.   Chemistry Recent Labs  Lab 07/29/22 1147 07/29/22 1154 07/30/22 0308 07/31/22 0106 08/01/22 0233  NA 137   < > 135 136 139  K 4.4   < > 4.0 3.8 3.9  CL 106   < > 104 107 107  CO2 23  --  21* 24 23  GLUCOSE 140*   < > 185* 117* 126*  BUN 23   < > 24* 30* 19  CREATININE 1.49*   < > 1.50* 1.39* 1.23  CALCIUM 8.6*  --  8.1* 7.7* 8.1*  PROT 6.7  --   --   --   --   ALBUMIN 3.8  --   --   --   --   AST 32  --   --   --   --   ALT 25  --   --   --   --   ALKPHOS 64  --   --   --   --   BILITOT 0.6  --   --   --   --   GFRNONAA 49*   < > 49* 53* >60  ANIONGAP  8  --  10 5 9    < > = values in this interval not displayed.     Hematology Recent Labs  Lab 07/30/22 0308 07/31/22 0106 08/01/22 0233  WBC 13.4* 12.1* 9.0  RBC 3.41* 2.66* 2.55*  HGB 10.8* 8.7* 8.3*  HCT 32.4* 26.0* 24.4*  MCV 95.0 97.7 95.7  MCH 31.7 32.7 32.5  MCHC 33.3 33.5 34.0  RDW 12.6 12.6 12.7  PLT 183 156 155    BNPNo results for input(s): "BNP", "PROBNP" in the last 168 hours.   DDimer  Recent Labs  Lab 07/31/22 0305  DDIMER 1.87*     Radiology    ECHOCARDIOGRAM COMPLETE  Result Date: 08/01/2022    ECHOCARDIOGRAM REPORT   Patient Name:   Randall Reeves Date of Exam: 08/01/2022 Medical Rec #:  [id number]  Height:       70.0 in Accession #:    [id number] Weight:       205.9 lb Date of Birth:  11-16-1945  BSA:          2.113 m Patient Age:    76 years   BP:           133/86 mmHg Patient Gender: M          HR:           83 bpm. Exam Location:  Inpatient Procedure: 2D Echo, Cardiac Doppler and Color Doppler Indications:    Atrial Flutter I48.92  History:        Patient has prior history of Echocardiogram examinations, most                 recent 12/29/2015. CAD, Arrythmias:Atrial Fibrillation; Risk                 Factors:Hypertension and Dyslipidemia.   Sonographer:    Celesta Gentile RCS Referring Phys: [phone] Osvaldo Shipper IMPRESSIONS  1. Left ventricular ejection fraction, by estimation, is 60 to 65%. The left ventricle has normal function. The left ventricle has no regional wall motion abnormalities. Left ventricular diastolic parameters are consistent with Grade I diastolic dysfunction (impaired relaxation).  2. Right ventricular systolic function is normal. The right ventricular size is normal. Tricuspid regurgitation signal is inadequate for assessing PA pressure.  3. The mitral valve is grossly normal. Trivial mitral valve regurgitation.  4. The aortic valve is tricuspid. Aortic valve regurgitation is not visualized.  5. The inferior vena cava is normal in size with greater than 50% respiratory variability, suggesting right atrial pressure of 3 mmHg. Comparison(s): Prior images unable to be directly viewed, comparison made by report only. FINDINGS  Left Ventricle: Left ventricular ejection fraction, by estimation, is 60 to 65%. The left ventricle has normal function. The left ventricle has no regional wall motion abnormalities. Definity contrast agent was given IV to delineate the left ventricular  endocardial borders. The left ventricular internal cavity size was normal in size. There is no left ventricular hypertrophy. Left ventricular diastolic parameters are consistent with Grade I diastolic dysfunction (impaired relaxation). Right Ventricle: The right ventricular size is normal. No increase in right ventricular wall thickness. Right ventricular systolic function is normal. Tricuspid regurgitation signal is inadequate for assessing PA pressure. Left Atrium: Left atrial size was normal in size. Right Atrium: Right atrial size was normal in size. Pericardium: There is no evidence of pericardial effusion. Mitral Valve: The mitral valve is grossly normal. Mild mitral annular calcification. Trivial mitral valve regurgitation. Tricuspid Valve: The tricuspid  valve is grossly  normal. Tricuspid valve regurgitation is trivial. Aortic Valve: The aortic valve is tricuspid. There is mild aortic valve annular calcification. Aortic valve regurgitation is not visualized. Pulmonic Valve: The pulmonic valve was not well visualized. Pulmonic valve regurgitation is trivial. Aorta: The aortic root is normal in size and structure. Venous: The inferior vena cava is normal in size with greater than 50% respiratory variability, suggesting right atrial pressure of 3 mmHg. IAS/Shunts: No atrial level shunt detected by color flow Doppler.  LEFT VENTRICLE PLAX 2D LVIDd:         4.30 cm   Diastology LVIDs:         2.70 cm   LV e' medial:    8.59 cm/s LV PW:         0.80 cm   LV E/e' medial:  10.6 LV IVS:        0.90 cm   LV e' lateral:   9.68 cm/s LVOT diam:     1.90 cm   LV E/e' lateral: 9.4 LV SV:         55 LV SV Index:   26 LVOT Area:     2.84 cm  RIGHT VENTRICLE RV S prime:     20.10 cm/s TAPSE (M-mode): 2.9 cm LEFT ATRIUM             Index        RIGHT ATRIUM           Index LA diam:        2.90 cm 1.37 cm/m   RA Area:     19.50 cm LA Vol (A2C):   68.4 ml 32.37 ml/m  RA Volume:   56.00 ml  26.50 ml/m LA Vol (A4C):   60.7 ml 28.72 ml/m LA Biplane Vol: 64.2 ml 30.38 ml/m  AORTIC VALVE LVOT Vmax:   102.00 cm/s LVOT Vmean:  72.300 cm/s LVOT VTI:    0.195 m  AORTA Ao Root diam: 3.17 cm MITRAL VALVE MV Area (PHT): 2.95 cm     SHUNTS MV Decel Time: 257 msec     Systemic VTI:  0.20 m MV E velocity: 91.20 cm/s   Systemic Diam: 1.90 cm MV A velocity: 136.00 cm/s MV E/A ratio:  0.67 Nona Dell MD Electronically signed by Nona Dell MD Signature Date/Time: 08/01/2022/1:04:18 PM    Final     Cardiac Studies   2d echo 08/01/22    1. Left ventricular ejection fraction, by estimation, is 60 to 65%. The  left ventricle has normal function. The left ventricle has no regional  wall motion abnormalities. Left ventricular diastolic parameters are  consistent with Grade I diastolic   dysfunction (impaired relaxation).   2. Right ventricular systolic function is normal. The right ventricular  size is normal. Tricuspid regurgitation signal is inadequate for assessing  PA pressure.   3. The mitral valve is grossly normal. Trivial mitral valve  regurgitation.   4. The aortic valve is tricuspid. Aortic valve regurgitation is not  visualized.   5. The inferior vena cava is normal in size with greater than 50%  respiratory variability, suggesting right atrial pressure of 3 mmHg.   Comparison(s): Prior images unable to be directly viewed, comparison made  by report only.   Patient Profile     77 y.o. male with HTN, HLD, OSA on CPAP, nonobstructive CAD by cath 2016 (vs 2011 per notes?), BPH, CKD 3a, splenic and renal artery aneurysms, neuropathy admitted with mechanical fall off a ladder, found to have  left femur fracture. Underwent intertrochanteric intramedullary nail placement on 07/29/22. Cardiology following for new onset AF.  Assessment & Plan    1. Newly recognized PAF - potentially brought on by physiologic stressors but unable to exclude occult PAF given age, OSA - initial TSH ? but recheck with Ft4 wnl - echo as above - EF wnl, G1DD, normal RV, trivial MR - started on IV amiodarone changed to oral amiodarone - Dr. Eden Emms had recommended starting [facility] when OK with ortho and Hgb deemed stable, labs still pending for today - will review amiodarone plan with MD (I.e. short term vs long term recs), appears there is BP room for AVN blocker if needed as well (would probably not be aggressive with dosing given orthostasis this admission)  2. Essential HTN - BP acceptable, orthostasis earlier this admission, ARB on hold  3. Hyperlipidemia, reported h/o nonobstructive CAD - continue rosuvastatin, lipids followed by PCP - LDL 68 in 05/2022  4. Fall with left femur fx s/p surgery 07/29/22 - also with post op anemia, CKD (Cr near baseline which appears around 1.4), elev  d-dimer possibly post-op but will defer to IM, ortho  For questions or updates, please contact [facility] Please consult [email or web address] for contact info under Cardiology/STEMI.  Signed, Laurann Montana, PA-C 08/02/2022, 8:54 AM    Patient seen and examined with Ronie Spies PA-C.  Agree as above, with the following exceptions and changes as noted below. Feels well overall with no specific concerns. Gen: NAD, CV: RRR, no murmurs, Lungs: clear, Abd: soft, Extrem: Warm, well perfused, no edema, Neuro/Psych: alert and oriented x 3, normal mood and affect. All available labs, radiology testing, previous records reviewed. Agree as above, Hb back and stable, primary team will start anticoagulation.   Parke Poisson, MD 08/02/22 12:53 PM

## 2022-08-02 NOTE — Progress Notes (Signed)
   Subjective:  Randall Reeves is a 77 y.o. male, 4 Days Post-Op    s/p Procedure(s): INTRAMEDULLARY (IM) NAIL INTERTROCHANTERIC   Patient reports pain as mild to moderate.  Denies any new numbness or tingling. Denies calf pain. Went into Afib and had to undergo cardiac work up over the weekend but otherwise doing well. Reports minimal pain at rest worse with ambulating.   Objective:   VITALS:   Vitals:   08/01/22 2245 08/02/22 0022 08/02/22 0507 08/02/22 0522  BP: 134/81 135/84  136/81  Pulse: 95 96  87  Resp: 20 16 16 19   Temp:  98.2 F (36.8 C)  98.4 F (36.9 C)  TempSrc:  Oral  Oral  SpO2: 96% 96%  95%  Weight:      Height:       In hospital chair NAD  LUE:    Neurovascular intact Sensation intact distally Intact pulses distally Dorsiflexion/Plantar flexion intact Incision: dressing C/D/I Compartment soft Ioban dressing intact. Calves soft. Pain with log roll  Lab Results  Component Value Date   WBC 9.0 08/01/2022   HGB 8.3 (L) 08/01/2022   HCT 24.4 (L) 08/01/2022   MCV 95.7 08/01/2022   PLT 155 08/01/2022   BMET    Component Value Date/Time   NA 139 08/01/2022 0233   K 3.9 08/01/2022 0233   CL 107 08/01/2022 0233   CO2 23 08/01/2022 0233   GLUCOSE 126 (H) 08/01/2022 0233   BUN 19 08/01/2022 0233   CREATININE 1.23 08/01/2022 0233   CREATININE 1.38 (H) 01/10/2014 1121   CALCIUM 8.1 (L) 08/01/2022 0233   GFRNONAA >60 08/01/2022 0233     Assessment/Plan: 4 Days Post-Op   Principal Problem:   Closed comminuted intertrochanteric fracture of left femur (HCC) Active Problems:   Hyperlipidemia with target LDL less than 70   Obstructive sleep apnea   Essential hypertension, benign   GERD   Coronary artery disease involving native coronary artery of native heart without angina pectoris   BPH with obstruction/lower urinary tract symptoms   Peripheral neuropathy   Stage 3a chronic kidney disease (HCC)   Hip fracture (HCC)   PAF (paroxysmal atrial  fibrillation) (HCC)   Advance diet Up with therapy Dispo: SNF , ok for discharge from ortho perspective. Hydrocodone rx printed for pain for SNF, reports he did not like tramadol and made him dizzy   Follow up in the office around 2-3 weeks from surgery.    Weightbearing Status: up to 50% Weightbearing to the LLE as tolerated but does not need to push for more weightbearing.  DVT Prophylaxis: per primary transitioning to eliquis due to adfib   Ortho signing off, message for questions or concerns.    [name] 08/02/2022, 8:02 AM  Dion Saucier PA-C  Physician Assistant with Dr. Rebekah [place]

## 2022-08-03 DIAGNOSIS — M6281 Muscle weakness (generalized): Secondary | ICD-10-CM | POA: Diagnosis not present

## 2022-08-03 DIAGNOSIS — Z7401 Bed confinement status: Secondary | ICD-10-CM | POA: Diagnosis not present

## 2022-08-03 DIAGNOSIS — N1831 Chronic kidney disease, stage 3a: Secondary | ICD-10-CM | POA: Diagnosis not present

## 2022-08-03 DIAGNOSIS — Z9181 History of falling: Secondary | ICD-10-CM | POA: Diagnosis not present

## 2022-08-03 DIAGNOSIS — I2510 Atherosclerotic heart disease of native coronary artery without angina pectoris: Secondary | ICD-10-CM | POA: Diagnosis not present

## 2022-08-03 DIAGNOSIS — I10 Essential (primary) hypertension: Secondary | ICD-10-CM | POA: Diagnosis not present

## 2022-08-03 DIAGNOSIS — S72009A Fracture of unspecified part of neck of unspecified femur, initial encounter for closed fracture: Secondary | ICD-10-CM | POA: Diagnosis not present

## 2022-08-03 DIAGNOSIS — R2689 Other abnormalities of gait and mobility: Secondary | ICD-10-CM | POA: Diagnosis not present

## 2022-08-03 DIAGNOSIS — E785 Hyperlipidemia, unspecified: Secondary | ICD-10-CM | POA: Diagnosis not present

## 2022-08-03 DIAGNOSIS — R2681 Unsteadiness on feet: Secondary | ICD-10-CM | POA: Diagnosis not present

## 2022-08-03 DIAGNOSIS — S72102D Unspecified trochanteric fracture of left femur, subsequent encounter for closed fracture with routine healing: Secondary | ICD-10-CM | POA: Diagnosis not present

## 2022-08-03 DIAGNOSIS — N401 Enlarged prostate with lower urinary tract symptoms: Secondary | ICD-10-CM | POA: Diagnosis not present

## 2022-08-03 DIAGNOSIS — Z7189 Other specified counseling: Secondary | ICD-10-CM | POA: Diagnosis not present

## 2022-08-03 DIAGNOSIS — N138 Other obstructive and reflux uropathy: Secondary | ICD-10-CM | POA: Diagnosis not present

## 2022-08-03 DIAGNOSIS — I4892 Unspecified atrial flutter: Secondary | ICD-10-CM | POA: Diagnosis not present

## 2022-08-03 DIAGNOSIS — D649 Anemia, unspecified: Secondary | ICD-10-CM | POA: Diagnosis not present

## 2022-08-03 DIAGNOSIS — S72012D Unspecified intracapsular fracture of left femur, subsequent encounter for closed fracture with routine healing: Secondary | ICD-10-CM | POA: Diagnosis not present

## 2022-08-03 DIAGNOSIS — S72142D Displaced intertrochanteric fracture of left femur, subsequent encounter for closed fracture with routine healing: Secondary | ICD-10-CM | POA: Diagnosis not present

## 2022-08-03 DIAGNOSIS — I129 Hypertensive chronic kidney disease with stage 1 through stage 4 chronic kidney disease, or unspecified chronic kidney disease: Secondary | ICD-10-CM | POA: Diagnosis not present

## 2022-08-03 DIAGNOSIS — R102 Pelvic and perineal pain: Secondary | ICD-10-CM | POA: Diagnosis not present

## 2022-08-03 DIAGNOSIS — K219 Gastro-esophageal reflux disease without esophagitis: Secondary | ICD-10-CM | POA: Diagnosis not present

## 2022-08-03 DIAGNOSIS — Z4789 Encounter for other orthopedic aftercare: Secondary | ICD-10-CM | POA: Diagnosis not present

## 2022-08-03 DIAGNOSIS — D62 Acute posthemorrhagic anemia: Secondary | ICD-10-CM | POA: Diagnosis not present

## 2022-08-03 DIAGNOSIS — I480 Paroxysmal atrial fibrillation: Secondary | ICD-10-CM | POA: Diagnosis not present

## 2022-08-03 DIAGNOSIS — G4733 Obstructive sleep apnea (adult) (pediatric): Secondary | ICD-10-CM | POA: Diagnosis not present

## 2022-08-03 LAB — CBC
HCT: 23.8 % — ABNORMAL LOW (ref 39.0–52.0)
Hemoglobin: 8.1 g/dL — ABNORMAL LOW (ref 13.0–17.0)
MCH: 32.3 pg (ref 26.0–34.0)
MCHC: 34 g/dL (ref 30.0–36.0)
MCV: 94.8 fL (ref 80.0–100.0)
Platelets: 225 10*3/uL (ref 150–400)
RBC: 2.51 MIL/uL — ABNORMAL LOW (ref 4.22–5.81)
RDW: 12.3 % (ref 11.5–15.5)
WBC: 7.4 10*3/uL (ref 4.0–10.5)
nRBC: 0.3 % — ABNORMAL HIGH (ref 0.0–0.2)

## 2022-08-03 LAB — BASIC METABOLIC PANEL
Anion gap: 8 (ref 5–15)
BUN: 16 mg/dL (ref 8–23)
CO2: 25 mmol/L (ref 22–32)
Calcium: 8.2 mg/dL — ABNORMAL LOW (ref 8.9–10.3)
Chloride: 103 mmol/L (ref 98–111)
Creatinine, Ser: 1.2 mg/dL (ref 0.61–1.24)
GFR, Estimated: >60 mL/min (ref >60)
Glucose, Bld: 104 mg/dL — ABNORMAL HIGH (ref 70–99)
Potassium: 3.7 mmol/L (ref 3.5–5.1)
Sodium: 136 mmol/L (ref 135–145)

## 2022-08-03 MED ORDER — POLYETHYLENE GLYCOL 3350 17 G PO PACK: 17.0000 g | PACK | Freq: Two times a day (BID) | ORAL | 0 refills | Status: DC

## 2022-08-03 MED ORDER — SENNOSIDES-DOCUSATE SODIUM 8.6-50 MG PO TABS: 2.0000 | ORAL_TABLET | Freq: Two times a day (BID) | ORAL | Status: DC

## 2022-08-03 MED ORDER — AMIODARONE HCL 200 MG PO TABS: 200.0000 mg | ORAL_TABLET | Freq: Every day | ORAL | Status: DC

## 2022-08-03 MED ORDER — APIXABAN 5 MG PO TABS: 5.0000 mg | ORAL_TABLET | Freq: Two times a day (BID) | ORAL | Status: DC

## 2022-08-03 NOTE — TOC Progression Note (Signed)
Transition of Care [facility]) - Progression Note    Patient Details  Name: Randall Reeves MRN: [id number] Date of Birth: [birth year]  Transition of [facility]) CM/SW Contact  Delilah Shan, LCSWA Phone Number: 08/03/2022, 8:44 AM  Clinical Narrative:     CSW received call from Tammy with [facility] who informed CSW that patients insurance has been approved for SNF auth# [id number]. [facility]AR approval # [id number]. CSW will continue to follow and assist with patients dc planning needs.  Expected Discharge Plan: Skilled Nursing Facility Barriers to Discharge: Continued Medical Work up  Expected Discharge Plan and Services In-house Referral: Clinical Social Work                                             Social Determinants of Health (SDOH) Interventions SDOH Screenings   Food Insecurity: No Food Insecurity (07/31/2022)  Housing: Low Risk  (07/31/2022)  Transportation Needs: No Transportation Needs (07/31/2022)  Utilities: Not At Risk (07/31/2022)  Depression (PHQ2-9): Low Risk  (05/27/2022)  Tobacco Use: Low Risk  (07/31/2022)    Readmission Risk Interventions     No data to display

## 2022-08-03 NOTE — Plan of Care (Signed)
  Problem: Education: Goal: Knowledge of General Education information will improve Description Including pain rating scale, medication(s)/side effects and non-pharmacologic comfort measures Outcome: Adequate for Discharge   Problem: Health Behavior/Discharge Planning: Goal: Ability to manage health-related needs will improve Outcome: Adequate for Discharge   

## 2022-08-03 NOTE — Discharge Summary (Signed)
[facility]  Physician Discharge Summary   Patient ID: Randall Reeves MRN: [id number] DOB/AGE: Dec 21, 1945 77 y.o.  Admit date: 07/29/2022 Discharge date:   08/03/2022   PCP: [name], MD  DISCHARGE DIAGNOSES:    Closed comminuted intertrochanteric fracture of left femur (HCC)   Hyperlipidemia with target LDL less than 70   Obstructive sleep apnea   Essential hypertension, benign   GERD   Coronary artery disease involving native coronary artery of native heart without angina pectoris   BPH with obstruction/lower urinary tract symptoms   Peripheral neuropathy   Stage 3a chronic kidney disease (HCC)   Hip fracture (HCC)   PAF (paroxysmal atrial fibrillation) (HCC)   RECOMMENDATIONS FOR OUTPATIENT FOLLOW UP: Please check CBC and basic metabolic panel in 2 to 3 days.   Home Health: SNF Equipment/Devices: None  CODE STATUS: Full code  DISCHARGE CONDITION: fair  Diet recommendation: Heart healthy  INITIAL HISTORY: Randall Reeves is a 77 y.o. male with medical history significant for CKD 3A, neuropathy, hypertension, hyperlipidemia, GERD, OSA, CAD, BPH, splenic and renal artery aneurysms presenting after a fall at home. CT of the hip confirmed femur head fracture and also subcapital fracture. Orthopedic consulted and pt underwent intertrochanteric intramedullary nail placement on 07/29/22.    Consultants: Orthopedics.  Cardiology   Procedures: ORIF 7/4.  Echocardiogram   HOSPITAL COURSE:   Atrial fibrillation with RVR Patient went into tachyarrhythmia on the night of 7/5.  Started on amiodarone infusion.  Seen by cardiology.  Patient converted to sinus rhythm.  Was transitioned over to oral amiodarone. Echocardiogram shows normal systolic function.   Started on apixaban. TSH and free T4 noted to be normal this morning. Elevated D-dimer of unclear significance.  Likely due to all of his acute medical issues.  Right ventricular function was noted to be normal on echo.   No further testing for elevated D-dimer.   Left femur fracture. Status post ORIF.  Pain control.  Bowel regimen.  Stably well-controlled.  Seen by PT and OT.  SNF is recommended for short-term rehab.   Chronic kidney disease stage IIIa Stable.  Labs are stable.   Essential hypertension Experience orthostatic hypotension in the hospital.  ARB is on hold.  Can be resumed depending on blood pressure trends at the skilled nursing facility.   Leukocytosis Likely reactive.  Resolved.   Normocytic anemia/acute blood loss anemia Some fluctuation is noted in hemoglobin.  No overt bleeding noted.  Plan to check hemoglobin in 2 to 3 days at [facility].   Hyperlipidemia Continue statin.   Obstructive sleep apnea CPAP   History of BPH Tamsulosin is listed in his "allergy list" but also on his home medication list.  Discussed with patient and confirm with his pharmacy that he is indeed taking this medication actively at this time.  Will resume.     Constipation Continue with bowel regimen.   History of coronary artery disease Stable.   Patient is stable.  Okay for discharge to SNF when bed is available.   PERTINENT LABS:  The results of significant diagnostics from this hospitalization (including imaging, microbiology, ancillary and laboratory) are listed below for reference.    Labs:   Basic Metabolic Panel: Recent Labs  Lab 07/30/22 0308 07/31/22 0106 08/01/22 0233 08/02/22 0946 08/03/22 0150  NA 135 136 139 138 136  K 4.0 3.8 3.9 3.5 3.7  CL 104 107 107 104 103  CO2 21* 24 23 23 25   GLUCOSE 185* 117* 126* 109*  104*  BUN 24* 30* 19 17 16   CREATININE 1.50* 1.39* 1.23 1.27* 1.20  CALCIUM 8.1* 7.7* 8.1* 8.8* 8.2*  MG  --   --  2.0  --   --    Liver Function Tests: Recent Labs  Lab 07/29/22 1147  AST 32  ALT 25  ALKPHOS 64  BILITOT 0.6  PROT 6.7  ALBUMIN 3.8    CBC: Recent Labs  Lab 07/30/22 0308 07/31/22 0106 08/01/22 0233 08/02/22 0946 08/03/22 0150  WBC  13.4* 12.1* 9.0 8.3 7.4  HGB 10.8* 8.7* 8.3* 9.3* 8.1*  HCT 32.4* 26.0* 24.4* 27.5* 23.8*  MCV 95.0 97.7 95.7 94.2 94.8  PLT 183 156 155 234 225      IMAGING STUDIES ECHOCARDIOGRAM COMPLETE  Result Date: 08/01/2022    ECHOCARDIOGRAM REPORT   Patient Name:   OLMAN YONO Morel Date of Exam: 08/01/2022 Medical Rec #:  [id number]  Height:       70.0 in Accession #:    [id number] Weight:       205.9 lb Date of Birth:  08-17-1945  BSA:          2.113 m Patient Age:    76 years   BP:           133/86 mmHg Patient Gender: M          HR:           83 bpm. Exam Location:  Inpatient Procedure: 2D Echo, Cardiac Doppler and Color Doppler Indications:    Atrial Flutter I48.92  History:        Patient has prior history of Echocardiogram examinations, most                 recent 12/29/2015. CAD, Arrythmias:Atrial Fibrillation; Risk                 Factors:Hypertension and Dyslipidemia.  Sonographer:    Celesta Gentile RCS Referring Phys: [phone] Osvaldo Shipper IMPRESSIONS  1. Left ventricular ejection fraction, by estimation, is 60 to 65%. The left ventricle has normal function. The left ventricle has no regional wall motion abnormalities. Left ventricular diastolic parameters are consistent with Grade I diastolic dysfunction (impaired relaxation).  2. Right ventricular systolic function is normal. The right ventricular size is normal. Tricuspid regurgitation signal is inadequate for assessing PA pressure.  3. The mitral valve is grossly normal. Trivial mitral valve regurgitation.  4. The aortic valve is tricuspid. Aortic valve regurgitation is not visualized.  5. The inferior vena cava is normal in size with greater than 50% respiratory variability, suggesting right atrial pressure of 3 mmHg. Comparison(s): Prior images unable to be directly viewed, comparison made by report only. FINDINGS  Left Ventricle: Left ventricular ejection fraction, by estimation, is 60 to 65%. The left ventricle has normal function. The left ventricle has no  regional wall motion abnormalities. Definity contrast agent was given IV to delineate the left ventricular  endocardial borders. The left ventricular internal cavity size was normal in size. There is no left ventricular hypertrophy. Left ventricular diastolic parameters are consistent with Grade I diastolic dysfunction (impaired relaxation). Right Ventricle: The right ventricular size is normal. No increase in right ventricular wall thickness. Right ventricular systolic function is normal. Tricuspid regurgitation signal is inadequate for assessing PA pressure. Left Atrium: Left atrial size was normal in size. Right Atrium: Right atrial size was normal in size. Pericardium: There is no evidence of pericardial effusion. Mitral Valve: The mitral valve is grossly normal. Mild mitral  annular calcification. Trivial mitral valve regurgitation. Tricuspid Valve: The tricuspid valve is grossly normal. Tricuspid valve regurgitation is trivial. Aortic Valve: The aortic valve is tricuspid. There is mild aortic valve annular calcification. Aortic valve regurgitation is not visualized. Pulmonic Valve: The pulmonic valve was not well visualized. Pulmonic valve regurgitation is trivial. Aorta: The aortic root is normal in size and structure. Venous: The inferior vena cava is normal in size with greater than 50% respiratory variability, suggesting right atrial pressure of 3 mmHg. IAS/Shunts: No atrial level shunt detected by color flow Doppler.  LEFT VENTRICLE PLAX 2D LVIDd:         4.30 cm   Diastology LVIDs:         2.70 cm   LV e' medial:    8.59 cm/s LV PW:         0.80 cm   LV E/e' medial:  10.6 LV IVS:        0.90 cm   LV e' lateral:   9.68 cm/s LVOT diam:     1.90 cm   LV E/e' lateral: 9.4 LV SV:         55 LV SV Index:   26 LVOT Area:     2.84 cm  RIGHT VENTRICLE RV S prime:     20.10 cm/s TAPSE (M-mode): 2.9 cm LEFT ATRIUM             Index        RIGHT ATRIUM           Index LA diam:        2.90 cm 1.37 cm/m   RA Area:      19.50 cm LA Vol (A2C):   68.4 ml 32.37 ml/m  RA Volume:   56.00 ml  26.50 ml/m LA Vol (A4C):   60.7 ml 28.72 ml/m LA Biplane Vol: 64.2 ml 30.38 ml/m  AORTIC VALVE LVOT Vmax:   102.00 cm/s LVOT Vmean:  72.300 cm/s LVOT VTI:    0.195 m  AORTA Ao Root diam: 3.17 cm MITRAL VALVE MV Area (PHT): 2.95 cm     SHUNTS MV Decel Time: 257 msec     Systemic VTI:  0.20 m MV E velocity: 91.20 cm/s   Systemic Diam: 1.90 cm MV A velocity: 136.00 cm/s MV E/A ratio:  0.67 Nona Dell MD Electronically signed by Nona Dell MD Signature Date/Time: 08/01/2022/1:04:18 PM    Final    DG FEMUR MIN 2 VIEWS LEFT  Result Date: 07/29/2022 CLINICAL DATA:  Intramedullary nailing of femur EXAM: LEFT FEMUR 2 VIEWS COMPARISON:  07/29/2022 FINDINGS: Six low resolution intraoperative spot views left femur. Total fluoroscopy time was 87.9 seconds, fluoroscopic dose of 16.99 mGy. Images were obtained during intramedullary rod and screw fixation of comminuted proximal left femoral fracture. Small fracture fragment adjacent to the tip of distal fixating screw. IMPRESSION: Intraoperative fluoroscopic assistance provided during ORIF of proximal left femoral fracture. Electronically Signed   By: Jasmine Pang M.D.   On: 07/29/2022 20:14   DG C-Arm 1-60 Min-No Report  Result Date: 07/29/2022 Fluoroscopy was utilized by the requesting physician.  No radiographic interpretation.   DG C-Arm 1-60 Min-No Report  Result Date: 07/29/2022 Fluoroscopy was utilized by the requesting physician.  No radiographic interpretation.   CT Hip Left Wo Contrast  Result Date: 07/29/2022 CLINICAL DATA:  Femoral neck fracture, patient presents for further characterization. EXAM: CT OF THE LEFT HIP WITHOUT CONTRAST TECHNIQUE: Multidetector CT imaging of the left hip was performed according  to the standard protocol. Multiplanar CT image reconstructions were also generated. RADIATION DOSE REDUCTION: This exam was performed according to the departmental  dose-optimization program which includes automated exposure control, adjustment of the mA and/or kV according to patient size and/or use of iterative reconstruction technique. COMPARISON:  None Available. FINDINGS: Bones/Joint/Cartilage Comminuted and angulated left intertrochanteric fracture with an oblique fracture component extending into the proximal diaphysis with 2.9 cm of posteromedial displacement of the major fracture fragment. Oblique fracture cleft involving the subcapital left femoral neck. Moderate osteoarthritis of the left hip. No other acute fracture or dislocation. No fracture or dislocation. Normal alignment. No joint effusion. Ligaments Ligaments are suboptimally evaluated by CT. Muscles and Tendons Muscles are normal. No muscle atrophy. No intramuscular fluid collection or hematoma. Soft tissue No fluid collection or hematoma.  No soft tissue mass. IMPRESSION: 1. Acute comminuted and angulated left intertrochanteric fracture with an oblique fracture component extending into the proximal diaphysis with 2.9 cm of posteromedial displacement of the major fracture fragment. 2. Acute subcapital left femoral neck fracture. Electronically Signed   By: Elige Ko M.D.   On: 07/29/2022 13:03   DG Pelvis Portable  Result Date: 07/29/2022 CLINICAL DATA:  Patient fell off a ladder. EXAM: PORTABLE PELVIS 1-2 VIEWS COMPARISON:  04/23/2018 FINDINGS: Severely comminuted left intertrochanteric femoral neck fracture identified with varus angulation. Fracture line extends into the proximal diaphysis. SI joints and symphysis pubis unremarkable. No evidence for pubic ramus fracture. IMPRESSION: Severely comminuted left intertrochanteric femoral neck fracture with varus angulation. Electronically Signed   By: Kennith Center M.D.   On: 07/29/2022 12:37   DG Femur Portable Min 2 Views Left  Result Date: 07/29/2022 CLINICAL DATA:  Patient fell off a ladder. EXAM: LEFT FEMUR PORTABLE 2 VIEWS COMPARISON:  None  Available. FINDINGS: Severely comminuted intertrochanteric left femoral neck fracture with varus angulation. Fracture line extends into the proximal femoral diaphysis. Calcifications in the region of the popliteal fossa on the lateral film are probably loose bodies in a Baker's cyst. IMPRESSION: Severely comminuted intertrochanteric left femoral neck fracture with varus angulation. Electronically Signed   By: Kennith Center M.D.   On: 07/29/2022 12:30   DG Chest Port 1 View  Result Date: 07/29/2022 CLINICAL DATA:  Status post fall EXAM: PORTABLE CHEST 1 VIEW COMPARISON:  04/23/2018 FINDINGS: The heart size and mediastinal contours are within normal limits. Both lungs are clear. The visualized skeletal structures are unremarkable. IMPRESSION: No active disease. Electronically Signed   By: Elige Ko M.D.   On: 07/29/2022 12:25    DISCHARGE EXAMINATION: Vitals:   08/02/22 1527 08/02/22 2057 08/03/22 0000 08/03/22 0415  BP: (!) 142/79  133/73 (!) 129/94  Pulse: 84 81  83  Resp:  18  16  Temp: 97.9 F (36.6 C) 98.9 F (37.2 C)  98.1 F (36.7 C)  TempSrc: Oral Oral  Oral  SpO2: (!) 2% 94%  94%  Weight:      Height:       General appearance: Awake alert.  In no distress Resp: Clear to auscultation bilaterally.  Normal effort Cardio: S1-S2 is normal regular.  No S3-S4.  No rubs murmurs or bruit GI: Abdomen is soft.  Nontender nondistended.  Bowel sounds are present normal.  No masses organomegaly     DISPOSITION: SNF  Discharge Instructions     Call MD for:  difficulty breathing, headache or visual disturbances   Complete by: As directed    Call MD for:  extreme fatigue  Complete by: As directed    Call MD for:  persistant dizziness or light-headedness   Complete by: As directed    Call MD for:  persistant nausea and vomiting   Complete by: As directed    Call MD for:  severe uncontrolled pain   Complete by: As directed    Call MD for:  temperature >100.4   Complete by: As  directed    Diet - low sodium heart healthy   Complete by: As directed    Discharge instructions   Complete by: As directed    Please review instructions on the discharge summary.  You were cared for by a hospitalist during your hospital stay. If you have any questions about your discharge medications or the care you received while you were in the hospital after you are discharged, you can call the unit and asked to speak with the hospitalist on call if the hospitalist that took care of you is not available. Once you are discharged, your primary care physician will handle any further medical issues. Please note that NO REFILLS for any discharge medications will be authorized once you are discharged, as it is imperative that you return to your primary care physician (or establish a relationship with a primary care physician if you do not have one) for your aftercare needs so that they can reassess your need for medications and monitor your lab values. If you do not have a primary care physician, you can call [phone] for a physician referral.   Increase activity slowly   Complete by: As directed           Allergies as of 08/03/2022       Reactions   Bisoprolol-hydrochlorothiazide    REACTION: muscle pain, cramps   Codeine Nausea Only, Itching   Imipramine Other (See Comments)   Dry mouth, dizziness, headaches, increased sweating    Tamsulosin Other (See Comments)   Dizziness        Medication List     STOP taking these medications    aspirin EC 81 MG tablet   losartan 50 MG tablet Commonly known as: COZAAR   LUTEIN PO   neomycin-polymyxin b-dexamethasone 3.5-10000-0.1 Oint Commonly known as: MAXITROL   neomycin-polymyxin b-dexamethasone 3.5-10000-0.1 Susp Commonly known as: MAXITROL   OVER THE COUNTER MEDICATION   trimethoprim-polymyxin b ophthalmic solution Commonly known as: POLYTRIM       TAKE these medications    acetaminophen 500 MG tablet Commonly known  as: TYLENOL Take 1,000 mg by mouth every 8 (eight) hours as needed.   amiodarone 200 MG tablet Commonly known as: PACERONE Take 1 tablet (200 mg total) by mouth daily. Start taking on: August 04, 2022   apixaban 5 MG Tabs tablet Commonly known as: ELIQUIS Take 1 tablet (5 mg total) by mouth 2 (two) times daily.   HYDROcodone-acetaminophen 5-325 MG tablet Commonly known as: NORCO/VICODIN Take 1 tablet by mouth every 6 (six) hours as needed for moderate pain or severe pain.   multivitamin tablet Take 1 tablet by mouth daily.   omeprazole 20 MG capsule Commonly known as: PRILOSEC Take 1 capsule (20 mg total) by mouth daily.   polyethylene glycol 17 g packet Commonly known as: MIRALAX / GLYCOLAX Take 17 g by mouth 2 (two) times daily.   rosuvastatin 10 MG tablet Commonly known as: CRESTOR TAKE ONE TABLET BY MOUTH ONCE DAILY   senna-docusate 8.6-50 MG tablet Commonly known as: Senokot-S Take 2 tablets by mouth 2 (two) times daily.  tamsulosin 0.4 MG Caps capsule Commonly known as: FLOMAX Take 1 capsule (0.4 mg total) by mouth daily.          Contact information for follow-up providers     Yolonda Kida, MD Follow up in 2 week(s).   Specialty: Orthopedic Surgery Why: For suture removal, For wound re-check Contact information: [address] [place] [address] [phone]         Ronney Asters, NP Follow up.   Specialty: Cardiology Why: [facility] - [facility] location - cardiology follow-up is scheduled for Tuesday August 24, 2022 at 8:25 AM (Arrive by 8:10 AM). Verdon Cummins is one of our nurse practitioners with Dr. Herbie Baltimore. Contact information: [address] [place] [address] [phone]              Contact information for after-discharge care     Destination     [facility] .   Service: Skilled Nursing Contact information: [address] [place]  [address] [phone]                     TOTAL DISCHARGE TIME: 35 minutes  Osvaldo Shipper  [facility] Pager on [email or web address]  08/03/2022, 9:39 AM

## 2022-08-03 NOTE — TOC Transition Note (Signed)
Transition of Care [facility]) - CM/SW Discharge Note   Patient Details  Name: Randall Reeves MRN: [id number] Date of Birth: 09-Mar-1945  Transition of Care [facility]) CM/SW Contact:  Delilah Shan, LCSWA Phone Number: 08/03/2022, 10:14 AM   Clinical Narrative:     CSW spoke with Sue Lush with [facility] who confirmed patient can dc over today if medically ready.  Patient will DC to: [facility] SNF  Anticipated DC date: 08/03/2022  Family notified: Bonita Quin  Transport by: Sharin Mons  ?  Per MD patient ready for DC to [facility] . RN, patient, patient's family, and facility notified of DC. Patients spouse Bonita Quin confirmed will bring cpap from home.Discharge Summary sent to facility. RN given number for report tele# [phone] RM# [phone]. DC packet on chart. Ambulance transport requested for patient.  CSW signing off.   Final next level of care: Skilled Nursing Facility Barriers to Discharge: No Barriers Identified   Patient Goals and CMS Choice CMS Medicare.gov Compare Post Acute Care list provided to:: Patient Choice offered to / list presented to : Patient  Discharge Placement                Patient chooses bed at: [facility] Patient to be transferred to facility by: [facility] Name of family member notified: Bonita Quin Patient and family notified of of transfer: 08/03/22  Discharge Plan and Services Additional resources added to the After Visit Summary for   In-house Referral: Clinical Social Work                                   Social Determinants of Health (SDOH) Interventions SDOH Screenings   Food Insecurity: No Food Insecurity (07/31/2022)  Housing: Low Risk  (07/31/2022)  Transportation Needs: No Transportation Needs (07/31/2022)  Utilities: Not At Risk (07/31/2022)  Depression (PHQ2-9): Low Risk  (05/27/2022)  Tobacco Use: Low Risk  (07/31/2022)     Readmission Risk Interventions     No data to display

## 2022-08-03 NOTE — Progress Notes (Signed)
Cardiology Review: Telemetry reviewed, patient remains in NSR. Plan discussed with Dr. Jacques Navy. Anticipate 2-3 months of amiodarone as outpatient with possible discontinuation if maintaining NSR. Primary team started Eliquis yesterday. Slight recurrent decline in Hgb noted. Appreciate IM review and management. Otherwise no change in cardiac regimen today. Our team will f/u again in AM. I also tentatively scheduled NL APP f/u 7/30 and put appt info on AVS.

## 2022-08-03 NOTE — Progress Notes (Signed)
Report given to nurse Tawanna Cooler from [facility]. Patient's spouse aware of disposition. Awaking for [facility].

## 2022-08-04 ENCOUNTER — Non-Acute Institutional Stay (SKILLED_NURSING_FACILITY): Payer: PPO | Admitting: Student

## 2022-08-04 ENCOUNTER — Encounter: Payer: Self-pay | Admitting: Student

## 2022-08-04 DIAGNOSIS — Z7189 Other specified counseling: Secondary | ICD-10-CM | POA: Diagnosis not present

## 2022-08-04 DIAGNOSIS — I10 Essential (primary) hypertension: Secondary | ICD-10-CM

## 2022-08-04 DIAGNOSIS — N138 Other obstructive and reflux uropathy: Secondary | ICD-10-CM

## 2022-08-04 DIAGNOSIS — I480 Paroxysmal atrial fibrillation: Secondary | ICD-10-CM | POA: Diagnosis not present

## 2022-08-04 DIAGNOSIS — N401 Enlarged prostate with lower urinary tract symptoms: Secondary | ICD-10-CM

## 2022-08-04 DIAGNOSIS — N1831 Chronic kidney disease, stage 3a: Secondary | ICD-10-CM

## 2022-08-04 DIAGNOSIS — D62 Acute posthemorrhagic anemia: Secondary | ICD-10-CM | POA: Diagnosis not present

## 2022-08-04 DIAGNOSIS — G4733 Obstructive sleep apnea (adult) (pediatric): Secondary | ICD-10-CM

## 2022-08-04 DIAGNOSIS — S72142D Displaced intertrochanteric fracture of left femur, subsequent encounter for closed fracture with routine healing: Secondary | ICD-10-CM

## 2022-08-04 NOTE — Progress Notes (Signed)
Provider:  Dr. Earnestine Mealing Location:  Other [place].  Nursing Home Room Number: [facility] 107A Place of Service:  SNF (31)  PCP: [name], MD Patient Care Team: [name], MD as PCP - General Marykay Lex, MD as PCP - Cardiology (Cardiology)  Extended Emergency Contact Information Primary Emergency Contact: Yadav,Linda Address: [address]          [place], [place] [address] [place] Home Phone: [phone] Mobile Phone: [phone] Relation: Spouse Secondary Emergency Contact: Loud,Corey  [place] Mobile Phone: [phone] Relation: Son  Code Status: Full Code.  Goals of Care: Advanced Directive information    08/04/2022    8:57 AM  Advanced Directives  Does Patient Have a Medical Advance Directive? Yes  Type of Estate agent of [place];Living will  Does patient want to make changes to medical advance directive? No - Patient declined  Copy of Healthcare Power of Attorney in Chart? Yes - validated most recent copy scanned in chart (See row information)    Chief Complaint  Patient presents with  . New Admit To SNF    Admission.     HPI: Patient is a 77 y.o. male seen today for admission to [facility] after admission to hospital for hip fracture. He is status post ORIF. Post op had Afib with RVR. He was started on amiodarone, started on eliquis as well.   He was at home on unlevel concrete and stood up on a ladder and he isn't sure what happened but the ladder fell from under him. It shattered hi femur. Denied confusion after surgery. Some of the pain medication (dilaudid) did make him dizzy and drop his BP and triggered atrial fibrillation. Denies signs of bleeding at this time. He always has a little swipe of blood. He has had some constipation, improved with miralax, but he does well with just stool softer. Urinating well. Pain is well-controlled with norco.   He had some other falls in  the last year. He was blowing leaves in November and he fell back with the leaf blower on his back and he landed on a rock column and broke multiple ribs. He didn't go to the doctor at that time. He has a son with landscaping business. He has a garden of fresh vegetables. He has a new puppy Earna Coder that is 35 months old. He drives. He was a Social worker. He has been married 55 years. Walks independently.  His goal is to return home   Recently started flomax in [place], he was on it before and it didn't do well. He is adherent to CPAP  Past Medical History:  Diagnosis Date  . Allergy   . Arthritis   . Cataract   . CKD (chronic kidney disease), stage III (HCC)   . Coronary artery disease, non-occlusive 09/2009   Minimal CAD / no flow-limiting lesions on Cath; CPX February 2022: 1200 exercise, reached 91% image PRN.  Peak VO2 26.5-normal= excellent functional capacity.  High anaerobic threshold suggest excellent trending effect.  At peak exercise, limited by obesity.:  Normal CPX  . GERD (gastroesophageal reflux disease)   . Hyperlipidemia   . Hypertension   . Myocardial infarction - Type 2a MI 2015    Felt to be Type II MI-demand infarct  . Neuromuscular disorder (HCC)   . Obstructive sleep apnea    Cpap as of 27 days ago  . Sleep apnea    wears  cpap   . Syncope    due to hypotension from BP meds    Past Surgical History:  Procedure Laterality Date  . CARDIOPULMONARY STRESS TEST  03/17/2020   PreEx PFTs: FVC 4.33 (105%), FEV1 3.23 (104%) FEV1/FVC 75 (98% ) MVV 90 (73%): NORMAL; Exercise 12 min;hip and knee fatigue and mild dyspnea.  Rest HR 80 bpm-> max HR 135 bpm (91% of MPHR); BP 124/76 up to 166/74 mmHg; good exercise effort. Peak VO2 26.5 (108% predicted); VE/VCO2 slope 30.  Peak RER 1.1, ventilator threshold 20.6 (84% predicted, 78% measured peak VO2).  Pulse ox remained 94 to 99%  . CARPAL TUNNEL RELEASE Left 02/2013  . COLONOSCOPY    . INGUINAL HERNIA REPAIR Left 1983    with vasectomy  . LEFT HEART CATH AND CORONARY ANGIOGRAPHY  10/20/2009   (In the setting of MI with trivial troponin elevation). LM normal --<LAD, LCx. LAD (2 small Diags) - no significant disease, LCx (co-dom with PDA) & 2 OMs (OM2 moderate size with faint "hazy" defect - not flow limiting. Co-dom RCA with minor irregularities.  EF ~60%. ? Mild Inf HK. --> Med Rx.  Felt to be Type II MI-demand infarct  . POLYPECTOMY    . SPERMATOCELECTOMY  1998  . TRANSTHORACIC ECHOCARDIOGRAM  10/2012; 12/2015   a) EF 55 to 60%.  GR 1 DD.  Normal motion.  Normal valves.;; b) Normal LV size and function.  EF 55 to 60%.  GR 2 DD.  Normal valves.  Marland Kitchen VASECTOMY  1983   with inguinal hernia repair    reports that he has never smoked. He has been exposed to tobacco smoke. He has never used smokeless tobacco. He reports current alcohol use of about 3.0 - 5.0 standard drinks of alcohol per week. He reports that he does not use drugs. Social History   Socioeconomic History  . Marital status: Married    Spouse name: Not on file  . Number of children: 2  . Years of education: Not on file  . Highest education level: Not on file  Occupational History  . Occupation: Aeronautical engineer  Tobacco Use  . Smoking status: Never    Passive exposure: Past  . Smokeless tobacco: Never  Vaping Use  . Vaping Use: Never used  Substance and Sexual Activity  . Alcohol use: Yes    Alcohol/week: 3.0 - 5.0 standard drinks of alcohol    Types: 3 - 5 Cans of beer per week    Comment:  a few beers in a week.  . Drug use: No  . Sexual activity: Not on file  Other Topics Concern  . Not on file  Social History Narrative   Has living will   Wife is his health care POA--alternate is son   Would accept resuscitation attempts.   Would leave feeding tube decision to his wife   Social Determinants of Health   Financial Resource Strain: Not on file  Food Insecurity: No Food Insecurity (07/31/2022)   Hunger Vital Sign   . Worried About  Programme researcher, broadcasting/film/video in the Last Year: Never true   . Ran Out of Food in the Last Year: Never true  Transportation Needs: No Transportation Needs (07/31/2022)   PRAPARE - Transportation   . Lack of Transportation (Medical): No   . Lack of Transportation (Non-Medical): No  Physical Activity: Not on file  Stress: Not on file  Social Connections: Not on file  Intimate Partner Violence: Not At Risk (07/31/2022)  Humiliation, Afraid, Rape, and Kick questionnaire   . Fear of Current or Ex-Partner: No   . Emotionally Abused: No   . Physically Abused: No   . Sexually Abused: No    Functional Status Survey:    Family History  Problem Relation Age of Onset  . Stroke Mother   . Hypertension Mother   . Diabetes Father   . Asthma Father   . Heart failure Father   . Heart disease Father   . Hypertension Sister   . Diabetes Sister   . Neuropathy Brother   . Hypertension Brother   . Diabetes Brother   . Heart disease Brother   . Prostate cancer Brother   . Hypertension Brother   . Colon polyps Brother   . Hypertension Brother   . Hypertension Brother   . Rectal cancer Neg Hx   . Stomach cancer Neg Hx   . Esophageal cancer Neg Hx   . Colon cancer Neg Hx     Health Maintenance  Topic Date Due  . Hepatitis C Screening  Never done  . INFLUENZA VACCINE  08/26/2022  . Medicare Annual Wellness (AWV)  05/27/2023  . DTaP/Tdap/Td (3 - Td or Tdap) 02/17/2026  . Colonoscopy  11/27/2026  . Pneumonia Vaccine 30+ Years old  Completed  . Zoster Vaccines- Shingrix  Completed  . HPV VACCINES  Aged Out  . COVID-19 Vaccine  Discontinued    Allergies  Allergen Reactions  . Bisoprolol-Hydrochlorothiazide     REACTION: muscle pain, cramps  . Codeine Nausea Only and Itching  . Imipramine Other (See Comments)    Dry mouth, dizziness, headaches, increased sweating   . Tamsulosin Other (See Comments)    Dizziness     Outpatient Encounter Medications as of 08/04/2022  Medication Sig  .  acetaminophen (TYLENOL) 500 MG tablet Take 1,000 mg by mouth every 8 (eight) hours as needed.  Marland Kitchen amiodarone (PACERONE) 200 MG tablet Take 1 tablet (200 mg total) by mouth daily.  Marland Kitchen apixaban (ELIQUIS) 5 MG TABS tablet Take 1 tablet (5 mg total) by mouth 2 (two) times daily.  Marland Kitchen HYDROcodone-acetaminophen (NORCO/VICODIN) 5-325 MG tablet Take 1 tablet by mouth every 6 (six) hours as needed for moderate pain or severe pain.  . Multiple Vitamin (MULTIVITAMIN) tablet Take 1 tablet by mouth daily.  Marland Kitchen omeprazole (PRILOSEC) 20 MG capsule Take 1 capsule (20 mg total) by mouth daily.  . polyethylene glycol (MIRALAX / GLYCOLAX) 17 g packet Take 17 g by mouth 2 (two) times daily.  . rosuvastatin (CRESTOR) 10 MG tablet TAKE ONE TABLET BY MOUTH ONCE DAILY  . senna-docusate (SENOKOT-S) 8.6-50 MG tablet Take 2 tablets by mouth 2 (two) times daily.  . tamsulosin (FLOMAX) 0.4 MG CAPS capsule Take 1 capsule (0.4 mg total) by mouth daily.  . Zinc Oxide (TRIPLE PASTE) 12.8 % ointment Apply 1 Application topically. Every shift.   No facility-administered encounter medications on file as of 08/04/2022.    Review of Systems  Vitals:   08/04/22 0851 08/04/22 0859  BP: (!) 152/84 (!) 145/80  Pulse: 85   Resp: 18   Temp: 98.5 F (36.9 C)   SpO2: 96%   Weight: 190 lb 6.4 oz (86.4 kg)   Height: 5\' 10"  (1.778 m)    Body mass index is 27.32 kg/m. Physical Exam  Labs reviewed: Basic Metabolic Panel: Recent Labs    05/27/22 0935 07/29/22 1147 08/01/22 0233 08/02/22 0946 08/03/22 0150  NA 139   < > 139 138  136  K 4.3   < > 3.9 3.5 3.7  CL 103   < > 107 104 103  CO2 29   < > 23 23 25   GLUCOSE 84   < > 126* 109* 104*  BUN 26*   < > 19 17 16   CREATININE 1.41   < > 1.23 1.27* 1.20  CALCIUM 9.5   < > 8.1* 8.8* 8.2*  MG  --   --  2.0  --   --   PHOS 3.0  --   --   --   --    < > = values in this interval not displayed.   Liver Function Tests: Recent Labs    05/27/22 0935 07/29/22 1147  AST 17 32  ALT  17 25  ALKPHOS 79 64  BILITOT 0.6 0.6  PROT 7.3 6.7  ALBUMIN 4.3  4.3 3.8   No results for input(s): "LIPASE", "AMYLASE" in the last 8760 hours. No results for input(s): "AMMONIA" in the last 8760 hours. CBC: Recent Labs    08/01/22 0233 08/02/22 0946 08/03/22 0150  WBC 9.0 8.3 7.4  HGB 8.3* 9.3* 8.1*  HCT 24.4* 27.5* 23.8*  MCV 95.7 94.2 94.8  PLT 155 234 225   Cardiac Enzymes: No results for input(s): "CKTOTAL", "CKMB", "CKMBINDEX", "TROPONINI" in the last 8760 hours. BNP: Invalid input(s): "POCBNP" Lab Results  Component Value Date   HGBA1C 5.9 05/27/2022   Lab Results  Component Value Date   TSH 3.813 08/01/2022   Lab Results  Component Value Date   VITAMINB12 612 05/27/2022   No results found for: "FOLATE" No results found for: "IRON", "TIBC", "FERRITIN"  Imaging and Procedures obtained prior to SNF admission: DG FEMUR MIN 2 VIEWS LEFT  Result Date: 07/29/2022 CLINICAL DATA:  Intramedullary nailing of femur EXAM: LEFT FEMUR 2 VIEWS COMPARISON:  07/29/2022 FINDINGS: Six low resolution intraoperative spot views left femur. Total fluoroscopy time was 87.9 seconds, fluoroscopic dose of 16.99 mGy. Images were obtained during intramedullary rod and screw fixation of comminuted proximal left femoral fracture. Small fracture fragment adjacent to the tip of distal fixating screw. IMPRESSION: Intraoperative fluoroscopic assistance provided during ORIF of proximal left femoral fracture. Electronically Signed   By: Jasmine Pang M.D.   On: 07/29/2022 20:14   DG C-Arm 1-60 Min-No Report  Result Date: 07/29/2022 Fluoroscopy was utilized by the requesting physician.  No radiographic interpretation.   DG C-Arm 1-60 Min-No Report  Result Date: 07/29/2022 Fluoroscopy was utilized by the requesting physician.  No radiographic interpretation.   CT Hip Left Wo Contrast  Result Date: 07/29/2022 CLINICAL DATA:  Femoral neck fracture, patient presents for further characterization.  EXAM: CT OF THE LEFT HIP WITHOUT CONTRAST TECHNIQUE: Multidetector CT imaging of the left hip was performed according to the standard protocol. Multiplanar CT image reconstructions were also generated. RADIATION DOSE REDUCTION: This exam was performed according to the departmental dose-optimization program which includes automated exposure control, adjustment of the mA and/or kV according to patient size and/or use of iterative reconstruction technique. COMPARISON:  None Available. FINDINGS: Bones/Joint/Cartilage Comminuted and angulated left intertrochanteric fracture with an oblique fracture component extending into the proximal diaphysis with 2.9 cm of posteromedial displacement of the major fracture fragment. Oblique fracture cleft involving the subcapital left femoral neck. Moderate osteoarthritis of the left hip. No other acute fracture or dislocation. No fracture or dislocation. Normal alignment. No joint effusion. Ligaments Ligaments are suboptimally evaluated by CT. Muscles and Tendons Muscles are normal. No muscle  atrophy. No intramuscular fluid collection or hematoma. Soft tissue No fluid collection or hematoma.  No soft tissue mass. IMPRESSION: 1. Acute comminuted and angulated left intertrochanteric fracture with an oblique fracture component extending into the proximal diaphysis with 2.9 cm of posteromedial displacement of the major fracture fragment. 2. Acute subcapital left femoral neck fracture. Electronically Signed   By: Elige Ko M.D.   On: 07/29/2022 13:03   DG Pelvis Portable  Result Date: 07/29/2022 CLINICAL DATA:  Patient fell off a ladder. EXAM: PORTABLE PELVIS 1-2 VIEWS COMPARISON:  04/23/2018 FINDINGS: Severely comminuted left intertrochanteric femoral neck fracture identified with varus angulation. Fracture line extends into the proximal diaphysis. SI joints and symphysis pubis unremarkable. No evidence for pubic ramus fracture. IMPRESSION: Severely comminuted left intertrochanteric  femoral neck fracture with varus angulation. Electronically Signed   By: Kennith Center M.D.   On: 07/29/2022 12:37   DG Femur Portable Min 2 Views Left  Result Date: 07/29/2022 CLINICAL DATA:  Patient fell off a ladder. EXAM: LEFT FEMUR PORTABLE 2 VIEWS COMPARISON:  None Available. FINDINGS: Severely comminuted intertrochanteric left femoral neck fracture with varus angulation. Fracture line extends into the proximal femoral diaphysis. Calcifications in the region of the popliteal fossa on the lateral film are probably loose bodies in a Baker's cyst. IMPRESSION: Severely comminuted intertrochanteric left femoral neck fracture with varus angulation. Electronically Signed   By: Kennith Center M.D.   On: 07/29/2022 12:30   DG Chest Port 1 View  Result Date: 07/29/2022 CLINICAL DATA:  Status post fall EXAM: PORTABLE CHEST 1 VIEW COMPARISON:  04/23/2018 FINDINGS: The heart size and mediastinal contours are within normal limits. Both lungs are clear. The visualized skeletal structures are unremarkable. IMPRESSION: No active disease. Electronically Signed   By: Elige Ko M.D.   On: 07/29/2022 12:25    Assessment/Plan There are no diagnoses linked to this encounter.   Family/ staff Communication:   Labs/tests ordered:

## 2022-08-05 ENCOUNTER — Encounter (HOSPITAL_COMMUNITY): Payer: Self-pay | Admitting: Orthopedic Surgery

## 2022-08-05 LAB — CBC: RBC: 2.79 — AB (ref 3.87–5.11)

## 2022-08-05 LAB — CBC AND DIFFERENTIAL
HCT: 26 — AB (ref 41–53)
Hemoglobin: 8.8 — AB (ref 13.5–17.5)
Neutrophils Absolute: 6224
Platelets: 345 10*3/uL (ref 150–400)
WBC: 8.4

## 2022-08-05 LAB — COMPREHENSIVE METABOLIC PANEL
Calcium: 8.5 — AB (ref 8.7–10.7)
eGFR: 63

## 2022-08-05 LAB — BASIC METABOLIC PANEL
BUN: 21 (ref 4–21)
CO2: 27 — AB (ref 13–22)
Chloride: 102 (ref 99–108)
Creatinine: 1.2 (ref 0.6–1.3)
Glucose: 97
Potassium: 4 mEq/L (ref 3.5–5.1)
Sodium: 136 — AB (ref 137–147)

## 2022-08-06 ENCOUNTER — Encounter (HOSPITAL_COMMUNITY): Payer: Self-pay | Admitting: Orthopedic Surgery

## 2022-08-17 ENCOUNTER — Other Ambulatory Visit: Payer: Self-pay | Admitting: Nurse Practitioner

## 2022-08-17 MED ORDER — HYDROCODONE-ACETAMINOPHEN 5-325 MG PO TABS: 1.0000 | ORAL_TABLET | Freq: Four times a day (QID) | ORAL | 0 refills | Status: DC | PRN

## 2022-08-18 DIAGNOSIS — S72012D Unspecified intracapsular fracture of left femur, subsequent encounter for closed fracture with routine healing: Secondary | ICD-10-CM | POA: Diagnosis not present

## 2022-08-18 DIAGNOSIS — S72102D Unspecified trochanteric fracture of left femur, subsequent encounter for closed fracture with routine healing: Secondary | ICD-10-CM | POA: Diagnosis not present

## 2022-08-19 ENCOUNTER — Non-Acute Institutional Stay (SKILLED_NURSING_FACILITY): Payer: PPO | Admitting: Nurse Practitioner

## 2022-08-19 ENCOUNTER — Encounter: Payer: Self-pay | Admitting: Nurse Practitioner

## 2022-08-19 DIAGNOSIS — S72142D Displaced intertrochanteric fracture of left femur, subsequent encounter for closed fracture with routine healing: Secondary | ICD-10-CM | POA: Diagnosis not present

## 2022-08-19 DIAGNOSIS — E785 Hyperlipidemia, unspecified: Secondary | ICD-10-CM

## 2022-08-19 DIAGNOSIS — I480 Paroxysmal atrial fibrillation: Secondary | ICD-10-CM

## 2022-08-19 DIAGNOSIS — N1831 Chronic kidney disease, stage 3a: Secondary | ICD-10-CM | POA: Diagnosis not present

## 2022-08-19 DIAGNOSIS — K219 Gastro-esophageal reflux disease without esophagitis: Secondary | ICD-10-CM

## 2022-08-19 DIAGNOSIS — I10 Essential (primary) hypertension: Secondary | ICD-10-CM

## 2022-08-19 DIAGNOSIS — N138 Other obstructive and reflux uropathy: Secondary | ICD-10-CM | POA: Diagnosis not present

## 2022-08-19 DIAGNOSIS — N401 Enlarged prostate with lower urinary tract symptoms: Secondary | ICD-10-CM | POA: Diagnosis not present

## 2022-08-19 MED ORDER — AMIODARONE HCL 200 MG PO TABS
200.0000 mg | ORAL_TABLET | Freq: Every day | ORAL | 0 refills | Status: DC
Start: 2022-08-19 — End: 2022-08-24

## 2022-08-19 MED ORDER — TAMSULOSIN HCL 0.4 MG PO CAPS
0.4000 mg | ORAL_CAPSULE | Freq: Every day | ORAL | 0 refills | Status: DC
Start: 2022-08-19 — End: 2022-11-30

## 2022-08-19 MED ORDER — ROSUVASTATIN CALCIUM 10 MG PO TABS
ORAL_TABLET | ORAL | 0 refills | Status: DC
Start: 2022-08-19 — End: 2022-12-15

## 2022-08-19 MED ORDER — OMEPRAZOLE 20 MG PO CPDR
20.0000 mg | DELAYED_RELEASE_CAPSULE | Freq: Every day | ORAL | 0 refills | Status: DC
Start: 2022-08-19 — End: 2022-10-15

## 2022-08-19 MED ORDER — LOSARTAN POTASSIUM 50 MG PO TABS
50.0000 mg | ORAL_TABLET | Freq: Every day | ORAL | 0 refills | Status: DC
Start: 2022-08-19 — End: 2022-12-15

## 2022-08-19 MED ORDER — APIXABAN 5 MG PO TABS
5.0000 mg | ORAL_TABLET | Freq: Two times a day (BID) | ORAL | 0 refills | Status: DC
Start: 2022-08-19 — End: 2022-08-24

## 2022-08-19 NOTE — Progress Notes (Signed)
Location:  Other [place].  Nursing Home Room Number: [facility] 107A Place of Service:  SNF (31) Abbey Chatters, NP  PCP: [name], MD  Patient Care Team: [name], MD as PCP - General Marykay Lex, MD as PCP - Cardiology (Cardiology)  Extended Emergency Contact Information Primary Emergency Contact: Gergen,Linda Address: [address]          [place], [place] [address] [place] Home Phone: [phone] Mobile Phone: [phone] Relation: Spouse Secondary Emergency Contact: Kensinger,Corey  [place] Mobile Phone: [phone] Relation: Son  Goals of care: Advanced Directive information    08/19/2022    8:57 AM  Advanced Directives  Does Patient Have a Medical Advance Directive? Yes  Type of Estate agent of [place];Living will  Does patient want to make changes to medical advance directive? No - Patient declined  Copy of Healthcare Power of Attorney in Chart? Yes - validated most recent copy scanned in chart (See row information)     Chief Complaint  Patient presents with   Discharge Note    Discharge.     HPI:  Pt is a 77 y.o. male seen today for Discharge home. Pt with history significant for CKD 3A, neuropathy, hypertension, hyperlipidemia, GERD, OSA, CAD, BPH, splenic and renal artery aneurysm. Pt fell at home and had fermur fracture and underwent ORIF on 7/4.  Pt went into a fib after surgery and was started on amiodarone and eliquis.  He has been rate controlled and no issues with anticoagulation.  He is walking well with walker at this time.  Minimal pain mostly using tylenol.  No issues with constipation.  No palpitations, abnormal bruising or bleeding.    Past Medical History:  Diagnosis Date   Allergy    Arthritis    Cataract    CKD (chronic kidney disease), stage III (HCC)    Coronary artery disease, non-occlusive 09/2009   Minimal CAD / no flow-limiting lesions on Cath; CPX  February 2022: 1200 exercise, reached 91% image PRN.  Peak VO2 26.5-normal= excellent functional capacity.  High anaerobic threshold suggest excellent trending effect.  At peak exercise, limited by obesity.:  Normal CPX   GERD (gastroesophageal reflux disease)    Hyperlipidemia    Hypertension    Myocardial infarction - Type 2a MI 2015    Felt to be Type II MI-demand infarct   Neuromuscular disorder (HCC)    Obstructive sleep apnea    Cpap as of 27 days ago   Sleep apnea    wears cpap    Syncope    due to hypotension from BP meds    Past Surgical History:  Procedure Laterality Date   CARDIOPULMONARY STRESS TEST  03/17/2020   PreEx PFTs: FVC 4.33 (105%), FEV1 3.23 (104%) FEV1/FVC 75 (98% ) MVV 90 (73%): NORMAL; Exercise 12 min;hip and knee fatigue and mild dyspnea.  Rest HR 80 bpm-> max HR 135 bpm (91% of MPHR); BP 124/76 up to 166/74 mmHg; good exercise effort. Peak VO2 26.5 (108% predicted); VE/VCO2 slope 30.  Peak RER 1.1, ventilator threshold 20.6 (84% predicted, 78% measured peak VO2).  Pulse ox remained 94 to 99%   CARPAL TUNNEL RELEASE Left 02/2013   COLONOSCOPY     INGUINAL HERNIA REPAIR Left 1983   with vasectomy   INTRAMEDULLARY (IM) NAIL INTERTROCHANTERIC Left 07/29/2022   Procedure: INTRAMEDULLARY (IM) NAIL INTERTROCHANTERIC;  Surgeon: Yolonda Kida, MD;  Location: [facility] OR;  Service: Orthopedics;  Laterality:  Left;   LEFT HEART CATH AND CORONARY ANGIOGRAPHY  10/20/2009   (In the setting of MI with trivial troponin elevation). LM normal --<LAD, LCx. LAD (2 small Diags) - no significant disease, LCx (co-dom with PDA) & 2 OMs (OM2 moderate size with faint "hazy" defect - not flow limiting. Co-dom RCA with minor irregularities.  EF ~60%. ? Mild Inf HK. --> Med Rx.  Felt to be Type II MI-demand infarct   POLYPECTOMY     SPERMATOCELECTOMY  1998   TRANSTHORACIC ECHOCARDIOGRAM  10/2012; 12/2015   a) EF 55 to 60%.  GR 1 DD.  Normal motion.  Normal valves.;; b) Normal LV size and  function.  EF 55 to 60%.  GR 2 DD.  Normal valves.   VASECTOMY  1983   with inguinal hernia repair    Allergies  Allergen Reactions   Bisoprolol-Hydrochlorothiazide     REACTION: muscle pain, cramps   Codeine Nausea Only and Itching   Imipramine Other (See Comments)    Dry mouth, dizziness, headaches, increased sweating    Tamsulosin Other (See Comments)    Dizziness     Outpatient Encounter Medications as of 08/19/2022  Medication Sig   acetaminophen (TYLENOL) 500 MG tablet Take 1,000 mg by mouth every 8 (eight) hours as needed.   amiodarone (PACERONE) 200 MG tablet Take 1 tablet (200 mg total) by mouth daily.   apixaban (ELIQUIS) 5 MG TABS tablet Take 1 tablet (5 mg total) by mouth 2 (two) times daily.   HYDROcodone-acetaminophen (NORCO/VICODIN) 5-325 MG tablet Take 1 tablet by mouth every 6 (six) hours as needed for moderate pain or severe pain.   losartan (COZAAR) 50 MG tablet Take 50 mg by mouth daily.   melatonin 3 MG TABS tablet Take 3 mg by mouth at bedtime.   Multiple Vitamin (MULTIVITAMIN) tablet Take 1 tablet by mouth daily.   omeprazole (PRILOSEC) 20 MG capsule Take 1 capsule (20 mg total) by mouth daily.   polyethylene glycol (MIRALAX / GLYCOLAX) 17 g packet Take 17 g by mouth 2 (two) times daily.   rosuvastatin (CRESTOR) 10 MG tablet TAKE ONE TABLET BY MOUTH ONCE DAILY   sennosides-docusate sodium (SENOKOT-S) 8.6-50 MG tablet Take 1 tablet by mouth daily as needed for constipation.   tamsulosin (FLOMAX) 0.4 MG CAPS capsule Take 1 capsule (0.4 mg total) by mouth daily.   Zinc Oxide (TRIPLE PASTE) 12.8 % ointment Apply 1 Application topically. Every shift.   [DISCONTINUED] senna-docusate (SENOKOT-S) 8.6-50 MG tablet Take 2 tablets by mouth 2 (two) times daily. (Patient taking differently: Take 2 tablets by mouth at bedtime as needed.)   No facility-administered encounter medications on file as of 08/19/2022.    Review of Systems  Constitutional:  Negative for activity  change, appetite change, fatigue and unexpected weight change.  HENT:  Negative for congestion and hearing loss.   Eyes: Negative.   Respiratory:  Negative for cough and shortness of breath.   Cardiovascular:  Negative for chest pain, palpitations and leg swelling.  Gastrointestinal:  Negative for abdominal pain, constipation and diarrhea.  Genitourinary:  Negative for difficulty urinating and dysuria.  Musculoskeletal:  Negative for arthralgias and myalgias.  Skin:  Negative for color change and wound.  Neurological:  Negative for dizziness and weakness.  Psychiatric/Behavioral:  Negative for agitation, behavioral problems and confusion.     Immunization History  Administered Date(s) Administered   Fluad Quad(high Dose 65+) 11/09/2018   Influenza Whole 12/16/2011   Influenza, High Dose Seasonal PF 11/10/2014,  11/09/2015   Influenza,inj,Quad PF,6+ Mos 10/30/2012   Influenza-Unspecified 11/08/2013, 11/12/2016, 10/25/2017, 09/26/2019, 11/09/2021   PFIZER(Purple Top)SARS-COV-2 Vaccination 03/24/2019, 04/01/2019, 04/22/2019, 10/26/2019, 12/07/2019   Pneumococcal Conjugate-13 01/08/2014   Pneumococcal Polysaccharide-23 10/30/2012   Td 10/01/2005   Tdap 02/18/2016   Zoster Recombinant(Shingrix) 11/27/2018, 04/18/2019   Zoster, Live 10/20/2010   Pertinent  Health Maintenance Due  Topic Date Due   INFLUENZA VACCINE  08/26/2022   Colonoscopy  11/27/2026      04/23/2018    8:00 AM 05/16/2020    8:31 AM 05/22/2021    9:13 AM 05/27/2022    8:42 AM 05/27/2022    9:14 AM  Fall Risk  Falls in the past year?  0 0 1 1  Was there an injury with Fall?    1 1  Fall Risk Category Calculator    2 2  (RETIRED) Patient Fall Risk Level High fall risk      Patient at Risk for Falls Due to    No Fall Risks   Fall risk Follow up    Falls evaluation completed Falls prevention discussed   Functional Status Survey:    Vitals:   08/19/22 0848  BP: 122/71  Pulse: 84  Resp: 16  Temp: 98 F (36.7 C)   SpO2: 97%  Weight: 182 lb (82.6 kg)  Height: 5\' 10"  (1.778 m)   Body mass index is 26.11 kg/m. Physical Exam Constitutional:      General: He is not in acute distress.    Appearance: He is well-developed. He is not diaphoretic.  HENT:     Head: Normocephalic and atraumatic.     Right Ear: External ear normal.     Left Ear: External ear normal.     Mouth/Throat:     Pharynx: No oropharyngeal exudate.  Eyes:     Conjunctiva/sclera: Conjunctivae normal.     Pupils: Pupils are equal, round, and reactive to light.  Cardiovascular:     Rate and Rhythm: Normal rate and regular rhythm.     Heart sounds: Normal heart sounds.  Pulmonary:     Effort: Pulmonary effort is normal.     Breath sounds: Normal breath sounds.  Abdominal:     General: Bowel sounds are normal.     Palpations: Abdomen is soft.  Musculoskeletal:        General: No tenderness.     Cervical back: Normal range of motion and neck supple.     Right lower leg: No edema.     Left lower leg: No edema.  Skin:    General: Skin is warm and dry.     Comments: Left hip incision with dressing  Neurological:     Mental Status: He is alert and oriented to person, place, and time.     Labs reviewed: Recent Labs    05/27/22 0935 07/29/22 1147 08/01/22 0233 08/02/22 0946 08/03/22 0150 08/05/22 0000  NA 139   < > 139 138 136 136*  K 4.3   < > 3.9 3.5 3.7 4.0  CL 103   < > 107 104 103 102  CO2 29   < > 23 23 25  27*  GLUCOSE 84   < > 126* 109* 104*  --   BUN 26*   < > 19 17 16 21   CREATININE 1.41   < > 1.23 1.27* 1.20 1.2  CALCIUM 9.5   < > 8.1* 8.8* 8.2* 8.5*  MG  --   --  2.0  --   --   --  PHOS 3.0  --   --   --   --   --    < > = values in this interval not displayed.   Recent Labs    05/27/22 0935 07/29/22 1147  AST 17 32  ALT 17 25  ALKPHOS 79 64  BILITOT 0.6 0.6  PROT 7.3 6.7  ALBUMIN 4.3  4.3 3.8   Recent Labs    08/01/22 0233 08/02/22 0946 08/03/22 0150 08/05/22 0000  WBC 9.0 8.3 7.4  8.4  NEUTROABS  --   --   --  6,224.00  HGB 8.3* 9.3* 8.1* 8.8*  HCT 24.4* 27.5* 23.8* 26*  MCV 95.7 94.2 94.8  --   PLT 155 234 225 345   Lab Results  Component Value Date   TSH 3.813 08/01/2022   Lab Results  Component Value Date   HGBA1C 5.9 05/27/2022   Lab Results  Component Value Date   CHOL 145 05/27/2022   HDL 51.70 05/27/2022   LDLCALC 78 05/22/2021   LDLDIRECT 68.0 05/27/2022   TRIG 217.0 (H) 05/27/2022   CHOLHDL 3 05/27/2022    Significant Diagnostic Results in last 30 days:  ECHOCARDIOGRAM COMPLETE  Result Date: 08/01/2022    ECHOCARDIOGRAM REPORT   Patient Name:   VERNON MAISH Schrimpf Date of Exam: 08/01/2022 Medical Rec #:  [id number]  Height:       70.0 in Accession #:    [id number] Weight:       205.9 lb Date of Birth:  1945-02-07  BSA:          2.113 m Patient Age:    76 years   BP:           133/86 mmHg Patient Gender: M          HR:           83 bpm. Exam Location:  Inpatient Procedure: 2D Echo, Cardiac Doppler and Color Doppler Indications:    Atrial Flutter I48.92  History:        Patient has prior history of Echocardiogram examinations, most                 recent 12/29/2015. CAD, Arrythmias:Atrial Fibrillation; Risk                 Factors:Hypertension and Dyslipidemia.  Sonographer:    Celesta Gentile RCS Referring Phys: [phone] Osvaldo Shipper IMPRESSIONS  1. Left ventricular ejection fraction, by estimation, is 60 to 65%. The left ventricle has normal function. The left ventricle has no regional wall motion abnormalities. Left ventricular diastolic parameters are consistent with Grade I diastolic dysfunction (impaired relaxation).  2. Right ventricular systolic function is normal. The right ventricular size is normal. Tricuspid regurgitation signal is inadequate for assessing PA pressure.  3. The mitral valve is grossly normal. Trivial mitral valve regurgitation.  4. The aortic valve is tricuspid. Aortic valve regurgitation is not visualized.  5. The inferior vena cava is normal in  size with greater than 50% respiratory variability, suggesting right atrial pressure of 3 mmHg. Comparison(s): Prior images unable to be directly viewed, comparison made by report only. FINDINGS  Left Ventricle: Left ventricular ejection fraction, by estimation, is 60 to 65%. The left ventricle has normal function. The left ventricle has no regional wall motion abnormalities. Definity contrast agent was given IV to delineate the left ventricular  endocardial borders. The left ventricular internal cavity size was normal in size. There is no left ventricular hypertrophy. Left ventricular diastolic parameters are  consistent with Grade I diastolic dysfunction (impaired relaxation). Right Ventricle: The right ventricular size is normal. No increase in right ventricular wall thickness. Right ventricular systolic function is normal. Tricuspid regurgitation signal is inadequate for assessing PA pressure. Left Atrium: Left atrial size was normal in size. Right Atrium: Right atrial size was normal in size. Pericardium: There is no evidence of pericardial effusion. Mitral Valve: The mitral valve is grossly normal. Mild mitral annular calcification. Trivial mitral valve regurgitation. Tricuspid Valve: The tricuspid valve is grossly normal. Tricuspid valve regurgitation is trivial. Aortic Valve: The aortic valve is tricuspid. There is mild aortic valve annular calcification. Aortic valve regurgitation is not visualized. Pulmonic Valve: The pulmonic valve was not well visualized. Pulmonic valve regurgitation is trivial. Aorta: The aortic root is normal in size and structure. Venous: The inferior vena cava is normal in size with greater than 50% respiratory variability, suggesting right atrial pressure of 3 mmHg. IAS/Shunts: No atrial level shunt detected by color flow Doppler.  LEFT VENTRICLE PLAX 2D LVIDd:         4.30 cm   Diastology LVIDs:         2.70 cm   LV e' medial:    8.59 cm/s LV PW:         0.80 cm   LV E/e' medial:   10.6 LV IVS:        0.90 cm   LV e' lateral:   9.68 cm/s LVOT diam:     1.90 cm   LV E/e' lateral: 9.4 LV SV:         55 LV SV Index:   26 LVOT Area:     2.84 cm  RIGHT VENTRICLE RV S prime:     20.10 cm/s TAPSE (M-mode): 2.9 cm LEFT ATRIUM             Index        RIGHT ATRIUM           Index LA diam:        2.90 cm 1.37 cm/m   RA Area:     19.50 cm LA Vol (A2C):   68.4 ml 32.37 ml/m  RA Volume:   56.00 ml  26.50 ml/m LA Vol (A4C):   60.7 ml 28.72 ml/m LA Biplane Vol: 64.2 ml 30.38 ml/m  AORTIC VALVE LVOT Vmax:   102.00 cm/s LVOT Vmean:  72.300 cm/s LVOT VTI:    0.195 m  AORTA Ao Root diam: 3.17 cm MITRAL VALVE MV Area (PHT): 2.95 cm     SHUNTS MV Decel Time: 257 msec     Systemic VTI:  0.20 m MV E velocity: 91.20 cm/s   Systemic Diam: 1.90 cm MV A velocity: 136.00 cm/s MV E/A ratio:  0.67 Nona Dell MD Electronically signed by Nona Dell MD Signature Date/Time: 08/01/2022/1:04:18 PM    Final    DG FEMUR MIN 2 VIEWS LEFT  Result Date: 07/29/2022 CLINICAL DATA:  Intramedullary nailing of femur EXAM: LEFT FEMUR 2 VIEWS COMPARISON:  07/29/2022 FINDINGS: Six low resolution intraoperative spot views left femur. Total fluoroscopy time was 87.9 seconds, fluoroscopic dose of 16.99 mGy. Images were obtained during intramedullary rod and screw fixation of comminuted proximal left femoral fracture. Small fracture fragment adjacent to the tip of distal fixating screw. IMPRESSION: Intraoperative fluoroscopic assistance provided during ORIF of proximal left femoral fracture. Electronically Signed   By: Jasmine Pang M.D.   On: 07/29/2022 20:14   DG C-Arm 1-60 Min-No Report  Result Date: 07/29/2022  Fluoroscopy was utilized by the requesting physician.  No radiographic interpretation.   DG C-Arm 1-60 Min-No Report  Result Date: 07/29/2022 Fluoroscopy was utilized by the requesting physician.  No radiographic interpretation.   CT Hip Left Wo Contrast  Result Date: 07/29/2022 CLINICAL DATA:  Femoral neck  fracture, patient presents for further characterization. EXAM: CT OF THE LEFT HIP WITHOUT CONTRAST TECHNIQUE: Multidetector CT imaging of the left hip was performed according to the standard protocol. Multiplanar CT image reconstructions were also generated. RADIATION DOSE REDUCTION: This exam was performed according to the departmental dose-optimization program which includes automated exposure control, adjustment of the mA and/or kV according to patient size and/or use of iterative reconstruction technique. COMPARISON:  None Available. FINDINGS: Bones/Joint/Cartilage Comminuted and angulated left intertrochanteric fracture with an oblique fracture component extending into the proximal diaphysis with 2.9 cm of posteromedial displacement of the major fracture fragment. Oblique fracture cleft involving the subcapital left femoral neck. Moderate osteoarthritis of the left hip. No other acute fracture or dislocation. No fracture or dislocation. Normal alignment. No joint effusion. Ligaments Ligaments are suboptimally evaluated by CT. Muscles and Tendons Muscles are normal. No muscle atrophy. No intramuscular fluid collection or hematoma. Soft tissue No fluid collection or hematoma.  No soft tissue mass. IMPRESSION: 1. Acute comminuted and angulated left intertrochanteric fracture with an oblique fracture component extending into the proximal diaphysis with 2.9 cm of posteromedial displacement of the major fracture fragment. 2. Acute subcapital left femoral neck fracture. Electronically Signed   By: Elige Ko M.D.   On: 07/29/2022 13:03   DG Pelvis Portable  Result Date: 07/29/2022 CLINICAL DATA:  Patient fell off a ladder. EXAM: PORTABLE PELVIS 1-2 VIEWS COMPARISON:  04/23/2018 FINDINGS: Severely comminuted left intertrochanteric femoral neck fracture identified with varus angulation. Fracture line extends into the proximal diaphysis. SI joints and symphysis pubis unremarkable. No evidence for pubic ramus  fracture. IMPRESSION: Severely comminuted left intertrochanteric femoral neck fracture with varus angulation. Electronically Signed   By: Kennith Center M.D.   On: 07/29/2022 12:37   DG Femur Portable Min 2 Views Left  Result Date: 07/29/2022 CLINICAL DATA:  Patient fell off a ladder. EXAM: LEFT FEMUR PORTABLE 2 VIEWS COMPARISON:  None Available. FINDINGS: Severely comminuted intertrochanteric left femoral neck fracture with varus angulation. Fracture line extends into the proximal femoral diaphysis. Calcifications in the region of the popliteal fossa on the lateral film are probably loose bodies in a Baker's cyst. IMPRESSION: Severely comminuted intertrochanteric left femoral neck fracture with varus angulation. Electronically Signed   By: Kennith Center M.D.   On: 07/29/2022 12:30   DG Chest Port 1 View  Result Date: 07/29/2022 CLINICAL DATA:  Status post fall EXAM: PORTABLE CHEST 1 VIEW COMPARISON:  04/23/2018 FINDINGS: The heart size and mediastinal contours are within normal limits. Both lungs are clear. The visualized skeletal structures are unremarkable. IMPRESSION: No active disease. Electronically Signed   By: Elige Ko M.D.   On: 07/29/2022 12:25    Assessment/Plan 1. Closed comminuted intertrochanteric fracture of left femur with routine healing, subsequent encounter -s/p ORIF, pain is well controlled. Following up with orthopedics in the AM and then will be discharging home after.   2. Essential hypertension, benign -Blood pressure well controlled, goal bp <140/90 Continue current medications and dietary modifications - losartan (COZAAR) 50 MG tablet; Take 1 tablet (50 mg total) by mouth daily.  Dispense: 30 tablet; Refill: 0  3. BPH with obstruction/lower urinary tract symptoms -controlled on current regimen - tamsulosin (  FLOMAX) 0.4 MG CAPS capsule; Take 1 capsule (0.4 mg total) by mouth daily.  Dispense: 30 capsule; Refill: 0  4. PAF (paroxysmal atrial fibrillation) [facility]) Has   cardiology appt made for follow up.  No palpitations or chest pains.  Education provided on a fib and anticoaugation  - amiodarone (PACERONE) 200 MG tablet; Take 1 tablet (200 mg total) by mouth daily.  Dispense: 30 tablet; Refill: 0 - apixaban (ELIQUIS) 5 MG TABS tablet; Take 1 tablet (5 mg total) by mouth 2 (two) times daily.  Dispense: 60 tablet; Refill: 0  5. Stage 3a chronic kidney disease (HCC) Encourage proper hydration Avoid nephrotoxic meds (NSAIDS)  6. Hyperlipidemia with target LDL less than 70 Continue on crestor - rosuvastatin (CRESTOR) 10 MG tablet; TAKE ONE TABLET BY MOUTH ONCE DAILY  Dispense: 30 tablet; Refill: 0  7. Gastroesophageal reflux disease without esophagitis Controlled on prolosec  - omeprazole (PRILOSEC) 20 MG capsule; Take 1 capsule (20 mg total) by mouth daily.  Dispense: 30 capsule; Refill: 0  pt is stable for discharge-will need PT/OT/ per home health. DME needed includes walker. Rx sent via epic for 1 month supply, limited supply of  hydrocodone-apap given.  will need to follow up with PCP within 2 weeks.    Janene Harvey. Biagio Borg [facility] [phone]

## 2022-08-20 DIAGNOSIS — Z4789 Encounter for other orthopedic aftercare: Secondary | ICD-10-CM | POA: Diagnosis not present

## 2022-08-23 ENCOUNTER — Ambulatory Visit: Payer: PPO | Admitting: Internal Medicine

## 2022-08-23 DIAGNOSIS — S7292XD Unspecified fracture of left femur, subsequent encounter for closed fracture with routine healing: Secondary | ICD-10-CM | POA: Diagnosis not present

## 2022-08-23 NOTE — Progress Notes (Unsigned)
Cardiology Clinic Note   Patient Name: Randall Reeves Date of Encounter: 08/24/2022  Primary Care Provider:  [name], MD Primary Cardiologist:  Bryan Lemma, MD  Patient Profile    Randall Reeves 77 year old male presents to the clinic today for follow-up evaluation of his paroxysmal atrial fibrillation.  Past Medical History    Past Medical History:  Diagnosis Date   Allergy    Arthritis    Cataract    CKD (chronic kidney disease), stage III (HCC)    Coronary artery disease, non-occlusive 09/2009   Minimal CAD / no flow-limiting lesions on Cath; CPX February 2022: 1200 exercise, reached 91% image PRN.  Peak VO2 26.5-normal= excellent functional capacity.  High anaerobic threshold suggest excellent trending effect.  At peak exercise, limited by obesity.:  Normal CPX   GERD (gastroesophageal reflux disease)    Hyperlipidemia    Hypertension    Myocardial infarction - Type 2a MI 2015    Felt to be Type II MI-demand infarct   Neuromuscular disorder (HCC)    Obstructive sleep apnea    Cpap as of 27 days ago   Sleep apnea    wears cpap    Syncope    due to hypotension from BP meds    Past Surgical History:  Procedure Laterality Date   CARDIOPULMONARY STRESS TEST  03/17/2020   PreEx PFTs: FVC 4.33 (105%), FEV1 3.23 (104%) FEV1/FVC 75 (98% ) MVV 90 (73%): NORMAL; Exercise 12 min;hip and knee fatigue and mild dyspnea.  Rest HR 80 bpm-> max HR 135 bpm (91% of MPHR); BP 124/76 up to 166/74 mmHg; good exercise effort. Peak VO2 26.5 (108% predicted); VE/VCO2 slope 30.  Peak RER 1.1, ventilator threshold 20.6 (84% predicted, 78% measured peak VO2).  Pulse ox remained 94 to 99%   CARPAL TUNNEL RELEASE Left 02/2013   COLONOSCOPY     INGUINAL HERNIA REPAIR Left 1983   with vasectomy   INTRAMEDULLARY (IM) NAIL INTERTROCHANTERIC Left 07/29/2022   Procedure: INTRAMEDULLARY (IM) NAIL INTERTROCHANTERIC;  Surgeon: Yolonda Kida, MD;  Location: [facility] OR;  Service: Orthopedics;   Laterality: Left;   LEFT HEART CATH AND CORONARY ANGIOGRAPHY  10/20/2009   (In the setting of MI with trivial troponin elevation). LM normal --<LAD, LCx. LAD (2 small Diags) - no significant disease, LCx (co-dom with PDA) & 2 OMs (OM2 moderate size with faint "hazy" defect - not flow limiting. Co-dom RCA with minor irregularities.  EF ~60%. ? Mild Inf HK. --> Med Rx.  Felt to be Type II MI-demand infarct   POLYPECTOMY     SPERMATOCELECTOMY  1998   TRANSTHORACIC ECHOCARDIOGRAM  10/2012; 12/2015   a) EF 55 to 60%.  GR 1 DD.  Normal motion.  Normal valves.;; b) Normal LV size and function.  EF 55 to 60%.  GR 2 DD.  Normal valves.   VASECTOMY  1983   with inguinal hernia repair    Allergies  Allergies  Allergen Reactions   Bisoprolol-Hydrochlorothiazide     REACTION: muscle pain, cramps   Codeine Nausea Only and Itching   Imipramine Other (See Comments)    Dry mouth, dizziness, headaches, increased sweating    Tamsulosin Other (See Comments)    Dizziness     History of Present Illness    Randall Reeves is a PMH of hypertension, hyperlipidemia, OSA on CPAP, nonobstructive CAD (underwent cardiac catheterization in 2016), splenic and renal artery aneurysms, left femur IM nailing due to mechanical fall, and newly  recognized paroxysmal atrial fibrillation.  He underwent IM nailing for femur fracture.  He was admitted 07/29/2022 and discharged on 08/03/2022.  Cardiology was consulted to evaluate new onset atrial fibrillation.  It was felt that his atrial fibrillation Guidroz have been brought on by physiological stressors versus OSA.  His initial PSA was elevated however repeat free T4 was normal.  His echocardiogram was reassuring.  He was started on IV amiodarone and discharged on oral amiodarone.  Anticoagulation was started and recommended for 2 to 3 months.  AV nodal blocking agent was not recommended due to orthostasis with admission.  He presents to the clinic today for follow-up evaluation and  states he has moved home from [facility] and is waiting on home physical therapy.  He has been performing physical therapy exercises on his own at home.  He does have some slight generalized nonpitting swelling in his left lower extremity.  We reviewed his hospitalization.  He has not had any further episodes of accelerated heartbeat or palpitations.  He reports compliance with his apixaban and amiodarone.  He denies bleeding issues.  We reviewed bleeding precautions for apixaban.  He and his wife expressed understanding.  I will plan for 30-day cardiac event monitor in 1 month and if no atrial fibrillation is noted we will plan to discontinue apixaban and decrease amiodarone to 100 mg for 2 weeks before discontinuation.  Patient and his wife expressed understanding.  Today he denies chest pain, shortness of breath, lower extremity edema, fatigue, palpitations, melena, hematuria, hemoptysis, diaphoresis, weakness, presyncope, syncope, orthopnea, and PND.    Home Medications    Prior to Admission medications   Medication Sig Start Date End Date Taking? Authorizing Provider  acetaminophen (TYLENOL) 500 MG tablet Take 1,000 mg by mouth every 8 (eight) hours as needed.    [provider]  amiodarone (PACERONE) 200 MG tablet Take 1 tablet (200 mg total) by mouth daily. 08/19/22   Sharon Seller, NP  apixaban (ELIQUIS) 5 MG TABS tablet Take 1 tablet (5 mg total) by mouth 2 (two) times daily. 08/19/22   Sharon Seller, NP  HYDROcodone-acetaminophen (NORCO/VICODIN) 5-325 MG tablet Take 1 tablet by mouth every 6 (six) hours as needed for moderate pain or severe pain. 08/17/22   Sharon Seller, NP  losartan (COZAAR) 50 MG tablet Take 1 tablet (50 mg total) by mouth daily. 08/19/22   Sharon Seller, NP  melatonin 3 MG TABS tablet Take 3 mg by mouth at bedtime.    [provider]  Multiple Vitamin (MULTIVITAMIN) tablet Take 1 tablet by mouth daily.    [provider]   omeprazole (PRILOSEC) 20 MG capsule Take 1 capsule (20 mg total) by mouth daily. 08/19/22   Sharon Seller, NP  polyethylene glycol (MIRALAX / GLYCOLAX) 17 g packet Take 17 g by mouth 2 (two) times daily. 08/03/22   Osvaldo Shipper, MD  rosuvastatin (CRESTOR) 10 MG tablet TAKE ONE TABLET BY MOUTH ONCE DAILY 08/19/22   Sharon Seller, NP  sennosides-docusate sodium (SENOKOT-S) 8.6-50 MG tablet Take 1 tablet by mouth daily as needed for constipation.    [provider]  tamsulosin (FLOMAX) 0.4 MG CAPS capsule Take 1 capsule (0.4 mg total) by mouth daily. 08/19/22   Sharon Seller, NP  Zinc Oxide (TRIPLE PASTE) 12.8 % ointment Apply 1 Application topically. Every shift.    [provider]    Family History    Family History  Problem Relation Age of Onset  Stroke Mother    Hypertension Mother    Diabetes Father    Asthma Father    Heart failure Father    Heart disease Father    Hypertension Sister    Diabetes Sister    Neuropathy Brother    Hypertension Brother    Diabetes Brother    Heart disease Brother    Prostate cancer Brother    Hypertension Brother    Colon polyps Brother    Hypertension Brother    Hypertension Brother    Rectal cancer Neg Hx    Stomach cancer Neg Hx    Esophageal cancer Neg Hx    Colon cancer Neg Hx    He indicated that his mother is deceased. He indicated that his father is deceased. He indicated that his sister is alive. He indicated that two of his four brothers are alive. He indicated that his maternal grandmother is deceased. He indicated that his maternal grandfather is deceased. He indicated that his paternal grandmother is deceased. He indicated that his paternal grandfather is deceased. He indicated that both of his sons are alive. He indicated that the status of his neg hx is unknown.  Social History    Social History   Socioeconomic History   Marital status: Married    Spouse name: Not on file   Number of children:  2   Years of education: Not on file   Highest education level: Not on file  Occupational History   Occupation: landscaping  Tobacco Use   Smoking status: Never    Passive exposure: Past   Smokeless tobacco: Never  Vaping Use   Vaping status: Never Used  Substance and Sexual Activity   Alcohol use: Yes    Alcohol/week: 3.0 - 5.0 standard drinks of alcohol    Types: 3 - 5 Cans of beer per week    Comment:  a few beers in a week.   Drug use: No   Sexual activity: Not on file  Other Topics Concern   Not on file  Social History Narrative   Has living will   Wife is his health care POA--alternate is son   Would accept resuscitation attempts.   Would leave feeding tube decision to his wife   Social Determinants of Health   Financial Resource Strain: Not on file  Food Insecurity: No Food Insecurity (07/31/2022)   Hunger Vital Sign    Worried About Running Out of Food in the Last Year: Never true    Ran Out of Food in the Last Year: Never true  Transportation Needs: No Transportation Needs (07/31/2022)   PRAPARE - Administrator, Civil Service (Medical): No    Lack of Transportation (Non-Medical): No  Physical Activity: Not on file  Stress: Not on file  Social Connections: Not on file  Intimate Partner Violence: Not At Risk (07/31/2022)   Humiliation, Afraid, Rape, and Kick questionnaire    Fear of Current or Ex-Partner: No    Emotionally Abused: No    Physically Abused: No    Sexually Abused: No     Review of Systems    General:  No chills, fever, night sweats or weight changes.  Cardiovascular:  No chest pain, dyspnea on exertion, edema, orthopnea, palpitations, paroxysmal nocturnal dyspnea. Dermatological: No rash, lesions/masses Respiratory: No cough, dyspnea Urologic: No hematuria, dysuria Abdominal:   No nausea, vomiting, diarrhea, bright red blood per rectum, melena, or hematemesis Neurologic:  No visual changes, wkns, changes in mental status. All other  systems reviewed and are otherwise negative except as noted above.  Physical Exam    VS:  BP 138/72 (BP Location: Right Arm, Patient Position: Sitting, Cuff Size: Normal)   Pulse 91   Ht 5' 9.5" (1.765 m)   Wt 178 lb 9.6 oz (81 kg)   SpO2 96%   BMI 26.00 kg/m  , BMI Body mass index is 26 kg/m. GEN: Well nourished, well developed, in no acute distress. HEENT: normal. Neck: Supple, no JVD, carotid bruits, or masses. Cardiac: RRR, no murmurs, rubs, or gallops. No clubbing, cyanosis, edema.  Radials/DP/PT 2+ and equal bilaterally.  Respiratory:  Respirations regular and unlabored, clear to auscultation bilaterally. GI: Soft, nontender, nondistended, BS + x 4. MS: no deformity or atrophy. Skin: warm and dry, no rash. Neuro:  Strength and sensation are intact. Psych: Normal affect.  Accessory Clinical Findings    Recent Labs: 07/29/2022: ALT 25 08/01/2022: Magnesium 2.0; TSH 3.813 08/05/2022: BUN 21; Creatinine 1.2; Hemoglobin 8.8; Platelets 345; Potassium 4.0; Sodium 136   Recent Lipid Panel    Component Value Date/Time   CHOL 145 05/27/2022 0935   TRIG 217.0 (H) 05/27/2022 0935   HDL 51.70 05/27/2022 0935   CHOLHDL 3 05/27/2022 0935   VLDL 43.4 (H) 05/27/2022 0935   LDLCALC 78 05/22/2021 0931   LDLDIRECT 68.0 05/27/2022 0935         ECG personally reviewed by me today- none today.   Echocardiogram 08/01/2022  1. Left ventricular ejection fraction, by estimation, is 60 to 65%. The  left ventricle has normal function. The left ventricle has no regional  wall motion abnormalities. Left ventricular diastolic parameters are  consistent with Grade I diastolic  dysfunction (impaired relaxation).   2. Right ventricular systolic function is normal. The right ventricular  size is normal. Tricuspid regurgitation signal is inadequate for assessing  PA pressure.   3. The mitral valve is grossly normal. Trivial mitral valve  regurgitation.   4. The aortic valve is tricuspid. Aortic  valve regurgitation is not  visualized.   5. The inferior vena cava is normal in size with greater than 50%  respiratory variability, suggesting right atrial pressure of 3 mmHg.   Comparison(s): Prior images unable to be directly viewed, comparison made  by report only.      Assessment & Plan   1.  Paroxysmal atrial fibrillation-HR today 91 and regular.  Denies further episodes of accelerated or irregular heartbeat.  Not a candidate for AV nodal blocking agent due to history of orthostasis.  If cardiac event monitor shows no atrial fibrillation we will plan to discontinue apixaban and decrease amiodarone to 100 mg x 2 weeks and then discontinue. Continue apixaban, amiodarone-refilled Avoid triggers caffeine, chocolate, EtOH, dehydration etc. Order 30 day cardiac event monitor in 1 month  Essential hypertension-BP today 138/72. Maintain blood pressure log Continue losartan  Hyperlipidemia-LDL 78 on 05/22/21. Continue rosuvastatin High-fiber diet Follows with PCP   Disposition: Follow-up with Dr. Herbie Baltimore or me in 3 months.   [name]. Aarica Wax NP-C     08/24/2022, 9:15 AM [facility] [address] Office [phone] Fax [phone]    I spent 14 minutes examining this patient, reviewing medications, and using patient centered shared decision making involving her cardiac care.  Prior to her visit I spent greater than 20 minutes reviewing her past medical history,  medications, and prior cardiac tests.

## 2022-08-24 ENCOUNTER — Encounter: Payer: Self-pay | Admitting: Internal Medicine

## 2022-08-24 ENCOUNTER — Ambulatory Visit: Payer: PPO | Attending: General Practice | Admitting: General Practice

## 2022-08-24 ENCOUNTER — Encounter: Payer: Self-pay | Admitting: General Practice

## 2022-08-24 VITALS — BP 138/72 | HR 91 | Ht 69.5 in | Wt 178.6 lb

## 2022-08-24 DIAGNOSIS — I480 Paroxysmal atrial fibrillation: Secondary | ICD-10-CM

## 2022-08-24 DIAGNOSIS — E782 Mixed hyperlipidemia: Secondary | ICD-10-CM | POA: Diagnosis not present

## 2022-08-24 DIAGNOSIS — I10 Essential (primary) hypertension: Secondary | ICD-10-CM

## 2022-08-24 MED ORDER — APIXABAN 5 MG PO TABS: 5.0000 mg | ORAL_TABLET | Freq: Two times a day (BID) | ORAL | 0 refills | Status: DC

## 2022-08-24 MED ORDER — AMIODARONE HCL 200 MG PO TABS: 200.0000 mg | ORAL_TABLET | Freq: Every day | ORAL | 0 refills | Status: DC

## 2022-08-24 NOTE — Patient Instructions (Signed)
Medication Instructions:  The current medical regimen is effective;  continue present plan and medications as directed. Please refer to the Current Medication list given to you today. *If you need a refill on your cardiac medications before your next appointment, please call your pharmacy*  Lab Work: NONE If you have labs (blood work) drawn today and your tests are completely normal, you will receive your results only by:  MyChart Message (if you have MyChart) OR  A paper copy in the mail If you have any lab test that is abnormal or we need to change your treatment, we will call you to review the results.  Testing/Procedures: [employer] Cardiac Event Monitor Instructions  Your physician has requested you wear your cardiac event monitor for _____ days, (1-30). [employer] [place] call or text to confirm a shipping address. The monitor will be sent to a land address via UPS. [employer] will not ship a monitor to a PO BOX. It typically takes 3-5 days to receive your monitor after it has been enrolled. [employer] will assist with USPS tracking if your package is delayed. The telephone number for [employer] is [phone]. Once you have received your monitor, please review the enclosed instructions. Instruction tutorials can also be viewed under help and settings on the enclosed cell phone. Your monitor has already been registered assigning a specific monitor serial # to you.  Billing and Self Pay Discount Information  [employer] has been provided the insurance information we had on file for you.  If your insurance has been updated, please call [employer] at [phone] to provide them with your updated insurance information.   [employer] offers a discounted Self Pay option for patients who have insurance that does not cover their cardiac event monitor or patients without insurance.  The discounted cost of a Self Pay Cardiac Event Monitor would be $225.00 , if the patient contacts [employer] at  [phone] within 7 days of applying the monitor to make payment arrangements.  If the patient does not contact [employer] within 7 days of applying the monitor, the cost of the cardiac event monitor will be $350.00.  Applying the monitor  Remove cell phone from case and turn it on. The cell phone works as IT consultant and needs to be within [employer] of you at all times. The cell phone will need to be charged on a daily basis. We recommend you plug the cell phone into the enclosed charger at your bedside table every night.  Monitor batteries: You will receive two monitor batteries labelled #1 and #2. These are your recorders. Plug battery #2 onto the second connection on the enclosed charger. Keep one battery on the charger at all times. This will keep the monitor battery deactivated. It will also keep it fully charged for when you need to switch your monitor batteries. A small light will be blinking on the battery emblem when it is charging. The light on the battery emblem will remain on when the battery is fully charged.  Open package of a Monitor strip. Insert battery #1 into black hood on strip and gently squeeze monitor battery onto connection as indicated in instruction booklet. Set aside while preparing skin.  Choose location for your strip, vertical or horizontal, as indicated in the instruction booklet. Shave to remove all hair from location. There cannot be any lotions, oils, powders, or colognes on skin where monitor is to be applied. Wipe skin clean with enclosed Saline wipe. Dry skin completely.  Peel paper labeled #1 off the back  of the Monitor strip exposing the adhesive. Place the monitor on the chest in the vertical or horizontal position shown in the instruction booklet. One arrow on the monitor strip must be pointing upward. Carefully remove paper labeled #2, attaching remainder of strip to your skin. Try not to create any folds or wrinkles in the strip as you apply  it.  Firmly press and release the circle in the center of the monitor battery. You will hear a small beep. This is turning the monitor battery on. The heart emblem on the monitor battery will light up every 5 seconds if the monitor battery in turned on and connected to the patient securely. Do not push and hold the circle down as this turns the monitor battery off. The cell phone will locate the monitor battery. A screen will appear on the cell phone checking the connection of your monitor strip. This Paci read poor connection initially but change to good connection within the next minute. Once your monitor accepts the connection you will hear a series of 3 beeps followed by a climbing crescendo of beeps. A screen will appear on the cell phone showing the two monitor strip placement options. Touch the picture that demonstrates where you applied the monitor strip.  Your monitor strip and battery are waterproof. You are able to shower, bathe, or swim with the monitor on. They just ask you do not submerge deeper than 3 feet underwater. We recommend removing the monitor if you are swimming in a lake, river, or ocean.  Your monitor battery will need to be switched to a fully charged monitor battery approximately once a week. The cell phone will alert you of an action which needs to be made.  On the cell phone, tap for details to reveal connection status, monitor battery status, and cell phone battery status. The green dots indicates your monitor is in good status. A red dot indicates there is something that needs your attention.  To record a symptom, click the circle on the monitor battery. In 30-60 seconds a list of symptoms will appear on the cell phone. Select your symptom and tap save. Your monitor will record a sustained or significant arrhythmia regardless of you clicking the button. Some patients do not feel the heart rhythm irregularities. [employer] will notify us of any serious or  critical events.  Refer to instruction booklet for instructions on switching batteries, changing strips, the Do not disturb or Pause features, or any additional questions.  Call [employer] at [phone], to confirm your monitor is transmitting and record your baseline. They will answer any questions you Minerva have regarding the monitor instructions at that time.  Returning the monitor to [employer]  Place all equipment back into blue box. Peel off strip of paper to expose adhesive and close box securely. There is a prepaid UPS shipping label on this box. Drop in a UPS drop box, or at a UPS facility like [employer]. You [place] also contact [employer] to arrange UPS to pick up monitor package at your home.   Follow-Up: At [facility], you and your health needs are our priority.  As part of our continuing mission to provide you with exceptional heart care, we have created designated Provider Care Teams.  These Care Teams include your primary Cardiologist (physician) and Advanced Practice Providers (APPs -  Physician Assistants and Nurse Practitioners) who all work together to provide you with the care you need, when you need it.  Your next appointment:   3  month(s)  Provider:   Bryan Lemma, MD  or Edd Fabian, FNP        Other Instructions

## 2022-08-27 DIAGNOSIS — S72102D Unspecified trochanteric fracture of left femur, subsequent encounter for closed fracture with routine healing: Secondary | ICD-10-CM | POA: Diagnosis not present

## 2022-08-27 DIAGNOSIS — I1310 Hypertensive heart and chronic kidney disease without heart failure, with stage 1 through stage 4 chronic kidney disease, or unspecified chronic kidney disease: Secondary | ICD-10-CM | POA: Diagnosis not present

## 2022-08-27 DIAGNOSIS — G9009 Other idiopathic peripheral autonomic neuropathy: Secondary | ICD-10-CM | POA: Diagnosis not present

## 2022-08-27 DIAGNOSIS — I2510 Atherosclerotic heart disease of native coronary artery without angina pectoris: Secondary | ICD-10-CM | POA: Diagnosis not present

## 2022-08-27 DIAGNOSIS — I480 Paroxysmal atrial fibrillation: Secondary | ICD-10-CM | POA: Diagnosis not present

## 2022-08-27 DIAGNOSIS — N1831 Chronic kidney disease, stage 3a: Secondary | ICD-10-CM | POA: Diagnosis not present

## 2022-08-27 DIAGNOSIS — N138 Other obstructive and reflux uropathy: Secondary | ICD-10-CM | POA: Diagnosis not present

## 2022-08-27 DIAGNOSIS — Z7901 Long term (current) use of anticoagulants: Secondary | ICD-10-CM | POA: Diagnosis not present

## 2022-08-27 DIAGNOSIS — N401 Enlarged prostate with lower urinary tract symptoms: Secondary | ICD-10-CM | POA: Diagnosis not present

## 2022-08-27 DIAGNOSIS — D62 Acute posthemorrhagic anemia: Secondary | ICD-10-CM | POA: Diagnosis not present

## 2022-09-03 ENCOUNTER — Telehealth: Payer: Self-pay | Admitting: General Practice

## 2022-09-03 NOTE — Telephone Encounter (Signed)
Pt is calling for a recommendation. Since starting amiodarone and eliquis he has been having diarrhea and loss of appetite and has lost 20 pounds and is a bit fatigued. Is there something else he can try?

## 2022-09-03 NOTE — Telephone Encounter (Signed)
Pt c/o medication issue:  1. Name of Medication: amiodarone (PACERONE) 200 MG tablet  and apixaban (ELIQUIS) 5 MG TABS tablet   2. How are you currently taking this medication (dosage and times per day)? As directed  3. Are you having a reaction (difficulty breathing--STAT)? no  4. What is your medication issue? Patient states that he has lost his appetite and is having diarrhea. He states that he is down 20 pounds and the only thing that has changed was that he has been started on these medications. He wants to know if there can be a correlation between the medications and his symptoms. Please advise.

## 2022-09-03 NOTE — Telephone Encounter (Signed)
Amiodarone is known to cause loss of appetitie.  Loss of appetite a) Incidence: 4% to 9% [194] b) Anorexia was reported in 4% to 9% of patients treated with amiodarone for a mean duration of 441.3 days (range, 2 to 1515 days) in a retrospective study (n=241) [194].

## 2022-09-05 NOTE — Telephone Encounter (Signed)
Please contact Randall Reeves and let him know that his concerns have been reviewed.  We will reduce his amiodarone to 100 mg for 7 days and then have him discontinue.  I will start him on 180 mg of diltiazem extended release daily.  Please have him follow-up as scheduled.  Thank you.

## 2022-09-06 ENCOUNTER — Other Ambulatory Visit: Payer: Self-pay

## 2022-09-06 MED ORDER — DILTIAZEM HCL ER COATED BEADS 180 MG PO CP24: 180.0000 mg | ORAL_CAPSULE | Freq: Every day | ORAL | 3 refills | Status: DC

## 2022-09-06 NOTE — Telephone Encounter (Signed)
    Pt is calling to follow up 

## 2022-09-06 NOTE — Telephone Encounter (Signed)
Tried to call pt back, goes straight to voicemail. Left message to call back.

## 2022-09-06 NOTE — Telephone Encounter (Signed)
Called patient, made him aware of the response from NP. Patient verbalized understanding. He states he feels like he is being placed on medications for no reason. I did advise per note that we would do a Cardiac Event Monitor to see if he was having any afib episodes and then then plan to make adjustments if needed. Patient is aware, he states he is still not happy with being on medication that is suppose to make him feel better but doesn't. I advised that hopefully this medication switch would help. RX to be sent to pharmacy, and medication list updated.  Patient verbalized understanding.

## 2022-09-09 DIAGNOSIS — Z4789 Encounter for other orthopedic aftercare: Secondary | ICD-10-CM | POA: Diagnosis not present

## 2022-09-13 DIAGNOSIS — G4733 Obstructive sleep apnea (adult) (pediatric): Secondary | ICD-10-CM | POA: Diagnosis not present

## 2022-09-14 ENCOUNTER — Ambulatory Visit (INDEPENDENT_AMBULATORY_CARE_PROVIDER_SITE_OTHER): Payer: PPO | Admitting: Internal Medicine

## 2022-09-14 ENCOUNTER — Encounter: Payer: Self-pay | Admitting: Internal Medicine

## 2022-09-14 VITALS — BP 116/78 | HR 82 | Temp 97.9°F | Ht 69.5 in | Wt 179.0 lb

## 2022-09-14 DIAGNOSIS — I2510 Atherosclerotic heart disease of native coronary artery without angina pectoris: Secondary | ICD-10-CM

## 2022-09-14 DIAGNOSIS — S72142D Displaced intertrochanteric fracture of left femur, subsequent encounter for closed fracture with routine healing: Secondary | ICD-10-CM

## 2022-09-14 DIAGNOSIS — I480 Paroxysmal atrial fibrillation: Secondary | ICD-10-CM

## 2022-09-14 NOTE — Assessment & Plan Note (Signed)
No symptoms of angina ,etc On rosuvastatin and losartan 50mg  daily

## 2022-09-14 NOTE — Assessment & Plan Note (Addendum)
Now back in sinus rhythm---amiodarone just stopped Now on diltiazem 180 daily for rate (and BP) Eliquis 5 bid Will be getting a zio monitor from cardiology

## 2022-09-14 NOTE — Progress Notes (Signed)
Subjective:    Patient ID: Randall Reeves, male    DOB: 01-11-1946, 77 y.o.   MRN: [id number]  HPI Here with wife for follow up after hospitalization for left hip fracture and then rehab  Was on step ladder---and it flipped out from under him Landed on concrete Fractured left femur at hip---had rod and screws Rehab at [facility] since 7/26 Using walker all the time Independent with dressing and bathroom Stand by with wife for showers Wife handling household tasks---but he is starting (like cooking breakfast this morning) Not much pain till had ortho follow up last week---changed to 100% weight bearing (and that is painful) Using tylenol---has the hydrocodone just in case  Went into atrial fibrillation in hospital Now on apixaban Brief amiodarone---stopped that yesterday (lost appetite and weight) Now on diltiazem No palpitations  No chest pain or SOB  Current Outpatient Medications on File Prior to Visit  Medication Sig Dispense Refill   acetaminophen (TYLENOL) 500 MG tablet Take 1,000 mg by mouth every 8 (eight) hours as needed.     apixaban (ELIQUIS) 5 MG TABS tablet Take 1 tablet (5 mg total) by mouth 2 (two) times daily. 60 tablet 0   diltiazem (CARDIZEM CD) 180 MG 24 hr capsule Take 1 capsule (180 mg total) by mouth daily. 90 capsule 3   HYDROcodone-acetaminophen (NORCO/VICODIN) 5-325 MG tablet Take 1 tablet by mouth every 6 (six) hours as needed for moderate pain or severe pain. 60 tablet 0   losartan (COZAAR) 50 MG tablet Take 1 tablet (50 mg total) by mouth daily. 30 tablet 0   melatonin 3 MG TABS tablet Take 3 mg by mouth at bedtime.     Multiple Vitamin (MULTIVITAMIN) tablet Take 1 tablet by mouth daily.     omeprazole (PRILOSEC) 20 MG capsule Take 1 capsule (20 mg total) by mouth daily. 30 capsule 0   rosuvastatin (CRESTOR) 10 MG tablet TAKE ONE TABLET BY MOUTH ONCE DAILY 30 tablet 0   tamsulosin (FLOMAX) 0.4 MG CAPS capsule Take 1 capsule (0.4 mg total) by mouth  daily. 30 capsule 0   Zinc Oxide (TRIPLE PASTE) 12.8 % ointment Apply 1 Application topically. Every shift.     No current facility-administered medications on file prior to visit.    Allergies  Allergen Reactions   Amiodarone Other (See Comments)    Poor appetite   Bisoprolol-Hydrochlorothiazide     REACTION: muscle pain, cramps   Codeine Nausea Only and Itching   Imipramine Other (See Comments)    Dry mouth, dizziness, headaches, increased sweating    Tamsulosin Other (See Comments)    Dizziness     Past Medical History:  Diagnosis Date   Allergy    Arthritis    Cataract    CKD (chronic kidney disease), stage III (HCC)    Coronary artery disease, non-occlusive 09/2009   Minimal CAD / no flow-limiting lesions on Cath; CPX February 2022: 1200 exercise, reached 91% image PRN.  Peak VO2 26.5-normal= excellent functional capacity.  High anaerobic threshold suggest excellent trending effect.  At peak exercise, limited by obesity.:  Normal CPX   GERD (gastroesophageal reflux disease)    Hyperlipidemia    Hypertension    Myocardial infarction - Type 2a MI 2015    Felt to be Type II MI-demand infarct   Neuromuscular disorder (HCC)    Obstructive sleep apnea    Cpap as of 27 days ago   Sleep apnea    wears cpap  Syncope    due to hypotension from BP meds     Past Surgical History:  Procedure Laterality Date   CARDIOPULMONARY STRESS TEST  03/17/2020   PreEx PFTs: FVC 4.33 (105%), FEV1 3.23 (104%) FEV1/FVC 75 (98% ) MVV 90 (73%): NORMAL; Exercise 12 min;hip and knee fatigue and mild dyspnea.  Rest HR 80 bpm-> max HR 135 bpm (91% of MPHR); BP 124/76 up to 166/74 mmHg; good exercise effort. Peak VO2 26.5 (108% predicted); VE/VCO2 slope 30.  Peak RER 1.1, ventilator threshold 20.6 (84% predicted, 78% measured peak VO2).  Pulse ox remained 94 to 99%   CARPAL TUNNEL RELEASE Left 02/2013   COLONOSCOPY     INGUINAL HERNIA REPAIR Left 1983   with vasectomy   INTRAMEDULLARY (IM) NAIL  INTERTROCHANTERIC Left 07/29/2022   Procedure: INTRAMEDULLARY (IM) NAIL INTERTROCHANTERIC;  Surgeon: Yolonda Kida, MD;  Location: [facility] OR;  Service: Orthopedics;  Laterality: Left;   LEFT HEART CATH AND CORONARY ANGIOGRAPHY  10/20/2009   (In the setting of MI with trivial troponin elevation). LM normal --<LAD, LCx. LAD (2 small Diags) - no significant disease, LCx (co-dom with PDA) & 2 OMs (OM2 moderate size with faint "hazy" defect - not flow limiting. Co-dom RCA with minor irregularities.  EF ~60%. ? Mild Inf HK. --> Med Rx.  Felt to be Type II MI-demand infarct   POLYPECTOMY     SPERMATOCELECTOMY  1998   TRANSTHORACIC ECHOCARDIOGRAM  10/2012; 12/2015   a) EF 55 to 60%.  GR 1 DD.  Normal motion.  Normal valves.;; b) Normal LV size and function.  EF 55 to 60%.  GR 2 DD.  Normal valves.   VASECTOMY  1983   with inguinal hernia repair    Family History  Problem Relation Age of Onset   Stroke Mother    Hypertension Mother    Diabetes Father    Asthma Father    Heart failure Father    Heart disease Father    Hypertension Sister    Diabetes Sister    Neuropathy Brother    Hypertension Brother    Diabetes Brother    Heart disease Brother    Prostate cancer Brother    Hypertension Brother    Colon polyps Brother    Hypertension Brother    Hypertension Brother    Rectal cancer Neg Hx    Stomach cancer Neg Hx    Esophageal cancer Neg Hx    Colon cancer Neg Hx     Social History   Socioeconomic History   Marital status: Married    Spouse name: Not on file   Number of children: 2   Years of education: Not on file   Highest education level: Not on file  Occupational History   Occupation: Aeronautical engineer  Tobacco Use   Smoking status: Never    Passive exposure: Past   Smokeless tobacco: Never  Vaping Use   Vaping status: Never Used  Substance and Sexual Activity   Alcohol use: Yes    Alcohol/week: 3.0 - 5.0 standard drinks of alcohol    Types: 3 - 5 Cans of beer per week     Comment:  a few beers in a week.   Drug use: No   Sexual activity: Not on file  Other Topics Concern   Not on file  Social History Narrative   Has living will   Wife is his health care POA--alternate is son   Would accept resuscitation attempts.   Would leave feeding  tube decision to his wife   Social Determinants of Health   Financial Resource Strain: Not on file  Food Insecurity: No Food Insecurity (07/31/2022)   Hunger Vital Sign    Worried About Running Out of Food in the Last Year: Never true    Ran Out of Food in the Last Year: Never true  Transportation Needs: No Transportation Needs (07/31/2022)   PRAPARE - Administrator, Civil Service (Medical): No    Lack of Transportation (Non-Medical): No  Physical Activity: Not on file  Stress: Not on file  Social Connections: Not on file  Intimate Partner Violence: Not At Risk (07/31/2022)   Humiliation, Afraid, Rape, and Kick questionnaire    Fear of Current or Ex-Partner: No    Emotionally Abused: No    Physically Abused: No    Sexually Abused: No   Review of Systems Appetite some better on the half dose of amiodarone--now stopped Since hospital, he has had loose stools (not solid). Going 1-2 per day Heartburn controlled on omeprazole    Objective:   Physical Exam Constitutional:      Appearance: Normal appearance.  Cardiovascular:     Rate and Rhythm: Normal rate and regular rhythm.     Heart sounds: No murmur heard.    No gallop.  Pulmonary:     Effort: Pulmonary effort is normal.     Breath sounds: Normal breath sounds. No wheezing or rales.  Abdominal:     Palpations: Abdomen is soft.     Tenderness: There is no abdominal tenderness.  Musculoskeletal:     Cervical back: Neck supple.     Right lower leg: No edema.     Left lower leg: No edema.  Lymphadenopathy:     Cervical: No cervical adenopathy.  Neurological:     Mental Status: He is alert.  Psychiatric:        Mood and Affect: Mood normal.         Behavior: Behavior normal.            Assessment & Plan:

## 2022-09-14 NOTE — Assessment & Plan Note (Signed)
Doing well after surgery (rod) and rehab  Home for about 3 weeks Functional status improving Tylenol mostly for pain

## 2022-09-28 ENCOUNTER — Telehealth: Payer: Self-pay | Admitting: General Practice

## 2022-09-28 DIAGNOSIS — I480 Paroxysmal atrial fibrillation: Secondary | ICD-10-CM | POA: Diagnosis not present

## 2022-09-28 NOTE — Telephone Encounter (Signed)
Pt called in to inform Verdon Cummins, NP he will not be using his heart monitor and he is going to send it back. He states his bp is fine and he doesn't think he needs it.  He also stated he has been taking eliquis 2 times a day and wants it cut down to once a day. Please advise.

## 2022-09-28 NOTE — Telephone Encounter (Signed)
Patient states he is not doing the heart monitor because he has not have had AFIB since being in the hospital. He feels all the follow ups his heart been in normal rhythm and no signs of AFIB. His BP has been normal as well.  He states he wanted to decrease his eliquis to 1 tab daily. Did advise Eliquis is 1 tab BID and he will need to continue to take eliquis as prescribed. He verbalized understanding. He also stated that he was only supposed to take eliquis for 3 months and he is almost reaching three months. He does not want to continue taking eliquis.

## 2022-09-28 NOTE — Telephone Encounter (Signed)
Patient will start wearing monitor today

## 2022-09-29 DIAGNOSIS — I480 Paroxysmal atrial fibrillation: Secondary | ICD-10-CM | POA: Diagnosis not present

## 2022-10-14 DIAGNOSIS — G4733 Obstructive sleep apnea (adult) (pediatric): Secondary | ICD-10-CM | POA: Diagnosis not present

## 2022-10-14 DIAGNOSIS — H40003 Preglaucoma, unspecified, bilateral: Secondary | ICD-10-CM | POA: Diagnosis not present

## 2022-10-15 ENCOUNTER — Other Ambulatory Visit: Payer: Self-pay | Admitting: Internal Medicine

## 2022-10-15 ENCOUNTER — Other Ambulatory Visit: Payer: Self-pay | Admitting: General Practice

## 2022-10-15 DIAGNOSIS — K219 Gastro-esophageal reflux disease without esophagitis: Secondary | ICD-10-CM

## 2022-10-15 DIAGNOSIS — I480 Paroxysmal atrial fibrillation: Secondary | ICD-10-CM

## 2022-10-15 NOTE — Telephone Encounter (Signed)
Prescription refill request for Eliquis received. Indication: afib  Last office visit: 08/24/2022, [place] Scr: 1.2, 08/05/2022 Age: 77 yo  Weight: 81.2 kg   Refill sent.

## 2022-10-18 ENCOUNTER — Ambulatory Visit (INDEPENDENT_AMBULATORY_CARE_PROVIDER_SITE_OTHER): Payer: PPO | Admitting: Internal Medicine

## 2022-10-18 ENCOUNTER — Encounter: Payer: Self-pay | Admitting: Internal Medicine

## 2022-10-18 VITALS — BP 122/80 | HR 82 | Temp 98.1°F | Ht 69.5 in | Wt 191.0 lb

## 2022-10-18 DIAGNOSIS — N401 Enlarged prostate with lower urinary tract symptoms: Secondary | ICD-10-CM

## 2022-10-18 DIAGNOSIS — N138 Other obstructive and reflux uropathy: Secondary | ICD-10-CM

## 2022-10-18 DIAGNOSIS — I10 Essential (primary) hypertension: Secondary | ICD-10-CM | POA: Diagnosis not present

## 2022-10-18 DIAGNOSIS — I480 Paroxysmal atrial fibrillation: Secondary | ICD-10-CM

## 2022-10-18 DIAGNOSIS — S72142D Displaced intertrochanteric fracture of left femur, subsequent encounter for closed fracture with routine healing: Secondary | ICD-10-CM

## 2022-10-18 NOTE — Assessment & Plan Note (Signed)
Slow improvement He is frustrated by the pace--- but discussed that this is expected Going back to surgeon in 3 days Using tylenol, lidocaine, rare hydrocodone

## 2022-10-18 NOTE — Progress Notes (Signed)
Subjective:    Patient ID: Randall Reeves, male    DOB: [birth year], 77 y.o.   MRN: [id number]  HPI Here for follow up after hip fracture  Still variable status Some bad days Trying to gain confidence and get rid of walker--but then would get very sore after doing a lot (stationery bike, walking to mailbox) Still using the walker all the time Driving himself Wife still stands by when he showers--but not needing help Does the other ADLs Not able to carry stuff with walker Had done without the hydrocodone for several weeks---but used one yesterday Keeps up with 2 tylenol bid Uses icepacks after sore with exerise PT recommended lidocaine and fish oil also  No palpitations Has the zio monitor on now--for a month He thinks the dilaudid caused this  Still has some urinary frequency Wife is pushing fluids---keeping him hydrating  Current Outpatient Medications on File Prior to Visit  Medication Sig Dispense Refill   acetaminophen (TYLENOL) 500 MG tablet Take 1,000 mg by mouth every 8 (eight) hours as needed.     apixaban (ELIQUIS) 5 MG TABS tablet TAKE ONE TABLET (5 MG TOTAL) BY MOUTH TWO (TWO) TIMES DAILY. 60 tablet 5   diltiazem (CARDIZEM CD) 180 MG 24 hr capsule Take 1 capsule (180 mg total) by mouth daily. 90 capsule 3   HYDROcodone-acetaminophen (NORCO/VICODIN) 5-325 MG tablet Take 1 tablet by mouth every 6 (six) hours as needed for moderate pain or severe pain. 60 tablet 0   losartan (COZAAR) 50 MG tablet Take 1 tablet (50 mg total) by mouth daily. 30 tablet 0   melatonin 3 MG TABS tablet Take 3 mg by mouth at bedtime.     Multiple Vitamin (MULTIVITAMIN) tablet Take 1 tablet by mouth daily.     Omega-3 Fatty Acids (FISH OIL) 1000 MG CAPS Take 1 capsule by mouth daily at 8 pm.     omeprazole (PRILOSEC) 20 MG capsule TAKE ONE CAPSULE BY MOUTH TWICE DAILY 180 capsule 0   rosuvastatin (CRESTOR) 10 MG tablet TAKE ONE TABLET BY MOUTH ONCE DAILY 30 tablet 0   tamsulosin (FLOMAX) 0.4 MG  CAPS capsule Take 1 capsule (0.4 mg total) by mouth daily. 30 capsule 0   Zinc Oxide (TRIPLE PASTE) 12.8 % ointment Apply 1 Application topically. Every shift.     No current facility-administered medications on file prior to visit.    Allergies  Allergen Reactions   Dilaudid [Hydromorphone Hcl] Palpitations   Amiodarone Other (See Comments)    Poor appetite   Bisoprolol-Hydrochlorothiazide     REACTION: muscle pain, cramps   Codeine Nausea Only and Itching   Imipramine Other (See Comments)    Dry mouth, dizziness, headaches, increased sweating    Tamsulosin Other (See Comments)    Dizziness     Past Medical History:  Diagnosis Date   Allergy    Arthritis    Cataract    CKD (chronic kidney disease), stage III (HCC)    Coronary artery disease, non-occlusive 09/2009   Minimal CAD / no flow-limiting lesions on Cath; CPX February 2022: 1200 exercise, reached 91% image PRN.  Peak VO2 26.5-normal= excellent functional capacity.  High anaerobic threshold suggest excellent trending effect.  At peak exercise, limited by obesity.:  Normal CPX   GERD (gastroesophageal reflux disease)    Hyperlipidemia    Hypertension    Myocardial infarction - Type 2a MI 2015    Felt to be Type II MI-demand infarct   Neuromuscular disorder (HCC)  Obstructive sleep apnea    Cpap as of 27 days ago   Sleep apnea    wears cpap    Syncope    due to hypotension from BP meds     Past Surgical History:  Procedure Laterality Date   CARDIOPULMONARY STRESS TEST  03/17/2020   PreEx PFTs: FVC 4.33 (105%), FEV1 3.23 (104%) FEV1/FVC 75 (98% ) MVV 90 (73%): NORMAL; Exercise 12 min;hip and knee fatigue and mild dyspnea.  Rest HR 80 bpm-> max HR 135 bpm (91% of MPHR); BP 124/76 up to 166/74 mmHg; good exercise effort. Peak VO2 26.5 (108% predicted); VE/VCO2 slope 30.  Peak RER 1.1, ventilator threshold 20.6 (84% predicted, 78% measured peak VO2).  Pulse ox remained 94 to 99%   CARPAL TUNNEL RELEASE Left 02/2013    COLONOSCOPY     INGUINAL HERNIA REPAIR Left 1983   with vasectomy   INTRAMEDULLARY (IM) NAIL INTERTROCHANTERIC Left 07/29/2022   Procedure: INTRAMEDULLARY (IM) NAIL INTERTROCHANTERIC;  Surgeon: Yolonda Kida, MD;  Location: [facility] OR;  Service: Orthopedics;  Laterality: Left;   LEFT HEART CATH AND CORONARY ANGIOGRAPHY  10/20/2009   (In the setting of MI with trivial troponin elevation). LM normal --<LAD, LCx. LAD (2 small Diags) - no significant disease, LCx (co-dom with PDA) & 2 OMs (OM2 moderate size with faint "hazy" defect - not flow limiting. Co-dom RCA with minor irregularities.  EF ~60%. ? Mild Inf HK. --> Med Rx.  Felt to be Type II MI-demand infarct   POLYPECTOMY     SPERMATOCELECTOMY  1998   TRANSTHORACIC ECHOCARDIOGRAM  10/2012; 12/2015   a) EF 55 to 60%.  GR 1 DD.  Normal motion.  Normal valves.;; b) Normal LV size and function.  EF 55 to 60%.  GR 2 DD.  Normal valves.   VASECTOMY  1983   with inguinal hernia repair    Family History  Problem Relation Age of Onset   Stroke Mother    Hypertension Mother    Diabetes Father    Asthma Father    Heart failure Father    Heart disease Father    Hypertension Sister    Diabetes Sister    Neuropathy Brother    Hypertension Brother    Diabetes Brother    Heart disease Brother    Prostate cancer Brother    Hypertension Brother    Colon polyps Brother    Hypertension Brother    Hypertension Brother    Rectal cancer Neg Hx    Stomach cancer Neg Hx    Esophageal cancer Neg Hx    Colon cancer Neg Hx     Social History   Socioeconomic History   Marital status: Married    Spouse name: Not on file   Number of children: 2   Years of education: Not on file   Highest education level: Not on file  Occupational History   Occupation: Aeronautical engineer  Tobacco Use   Smoking status: Never    Passive exposure: Past   Smokeless tobacco: Never  Vaping Use   Vaping status: Never Used  Substance and Sexual Activity   Alcohol use:  Yes    Alcohol/week: 3.0 - 5.0 standard drinks of alcohol    Types: 3 - 5 Cans of beer per week    Comment:  a few beers in a week.   Drug use: No   Sexual activity: Not on file  Other Topics Concern   Not on file  Social History Narrative  Has living will   Wife is his health care POA--alternate is son   Would accept resuscitation attempts.   Would leave feeding tube decision to his wife   Social Determinants of Health   Financial Resource Strain: Not on file  Food Insecurity: No Food Insecurity (07/31/2022)   Hunger Vital Sign    Worried About Running Out of Food in the Last Year: Never true    Ran Out of Food in the Last Year: Never true  Transportation Needs: No Transportation Needs (07/31/2022)   PRAPARE - Administrator, Civil Service (Medical): No    Lack of Transportation (Non-Medical): No  Physical Activity: Not on file  Stress: Not on file  Social Connections: Not on file  Intimate Partner Violence: Not At Risk (07/31/2022)   Humiliation, Afraid, Rape, and Kick questionnaire    Fear of Current or Ex-Partner: No    Emotionally Abused: No    Physically Abused: No    Sexually Abused: No   Review of Systems Appetite is better--since off the amiodarone. Now on diltiazem Has gained his weight back Sleeps fair---still wakes "too much". Not taking melatonin---plans to restart    Objective:   Physical Exam Constitutional:      Appearance: Normal appearance.  Cardiovascular:     Rate and Rhythm: Normal rate and regular rhythm.     Heart sounds: No murmur heard.    No gallop.  Pulmonary:     Effort: Pulmonary effort is normal.     Breath sounds: Normal breath sounds. No wheezing or rales.  Musculoskeletal:     Cervical back: Neck supple.     Right lower leg: No edema.     Left lower leg: No edema.  Lymphadenopathy:     Cervical: No cervical adenopathy.  Neurological:     Mental Status: He is alert.            Assessment & Plan:

## 2022-10-18 NOTE — Assessment & Plan Note (Signed)
No symptoms Has zio on for one month---if negative, will stop the eliquis (per cardiology)

## 2022-10-18 NOTE — Assessment & Plan Note (Signed)
Urinary frequency likely from wife pushing fluids Doing okay on the tamsulosin daily

## 2022-10-18 NOTE — Assessment & Plan Note (Signed)
BP Readings from Last 3 Encounters:  10/18/22 122/80  09/14/22 116/78  08/24/22 138/72   Diltiazem 180 and losartan 50 daily

## 2022-10-20 ENCOUNTER — Telehealth: Payer: Self-pay | Admitting: Internal Medicine

## 2022-10-20 ENCOUNTER — Telehealth: Payer: Self-pay

## 2022-10-20 DIAGNOSIS — H40003 Preglaucoma, unspecified, bilateral: Secondary | ICD-10-CM | POA: Diagnosis not present

## 2022-10-20 DIAGNOSIS — H2513 Age-related nuclear cataract, bilateral: Secondary | ICD-10-CM | POA: Diagnosis not present

## 2022-10-20 NOTE — Telephone Encounter (Signed)
Pt with Known PAF/F;lutter -- monitor is to assess burden.  Do not need to call unless Tachycardic > 120 bpm  [facility]

## 2022-10-20 NOTE — Telephone Encounter (Signed)
Received a call for first detected AFL on patient's monitor. Asymptomatic. Patient prescribed anticoagulant. Can proceed with routine care.

## 2022-10-20 NOTE — Telephone Encounter (Signed)
Received critical monitor in onbase. Notes say they spoke with on call.   On call provider spoke with patient and gave instructions. He routed to Dr. Herbie Baltimore as well. Please see previous encounter.

## 2022-10-21 DIAGNOSIS — Z4789 Encounter for other orthopedic aftercare: Secondary | ICD-10-CM | POA: Diagnosis not present

## 2022-10-26 DIAGNOSIS — M25552 Pain in left hip: Secondary | ICD-10-CM | POA: Diagnosis not present

## 2022-11-02 ENCOUNTER — Ambulatory Visit: Payer: PPO | Attending: General Practice

## 2022-11-02 DIAGNOSIS — I480 Paroxysmal atrial fibrillation: Secondary | ICD-10-CM

## 2022-11-02 DIAGNOSIS — M25552 Pain in left hip: Secondary | ICD-10-CM | POA: Diagnosis not present

## 2022-11-05 DIAGNOSIS — M25552 Pain in left hip: Secondary | ICD-10-CM | POA: Diagnosis not present

## 2022-11-08 DIAGNOSIS — M25552 Pain in left hip: Secondary | ICD-10-CM | POA: Diagnosis not present

## 2022-11-10 ENCOUNTER — Telehealth: Payer: Self-pay | Admitting: Internal Medicine

## 2022-11-10 NOTE — Telephone Encounter (Signed)
Pt's wife, Bonita Quin, came by office stating she has been receiving bills for a $50 no show fee for a appt the pt had on 7/29. Bonita Quin states pt has been in rehab & didn't know about the appt. Linda requested for fee to be removed? Call back # [phone]

## 2022-11-11 DIAGNOSIS — M25552 Pain in left hip: Secondary | ICD-10-CM | POA: Diagnosis not present

## 2022-11-13 DIAGNOSIS — G4733 Obstructive sleep apnea (adult) (pediatric): Secondary | ICD-10-CM | POA: Diagnosis not present

## 2022-11-15 DIAGNOSIS — M25552 Pain in left hip: Secondary | ICD-10-CM | POA: Diagnosis not present

## 2022-11-19 DIAGNOSIS — M25552 Pain in left hip: Secondary | ICD-10-CM | POA: Diagnosis not present

## 2022-11-22 DIAGNOSIS — M25552 Pain in left hip: Secondary | ICD-10-CM | POA: Diagnosis not present

## 2022-11-22 NOTE — Progress Notes (Addendum)
Cardiology Clinic Note   Patient Name: Randall Reeves Date of Encounter: 11/24/2022  Primary Care Provider:  [name], MD Primary Cardiologist:  Bryan Lemma, MD  Patient Profile    Randall Reeves 77 year old male presents to the clinic today for follow-up evaluation of his paroxysmal atrial fibrillation.  Past Medical History    Past Medical History:  Diagnosis Date   Allergy    Arthritis    Cataract    CKD (chronic kidney disease), stage III (HCC)    Coronary artery disease, non-occlusive 09/2009   Minimal CAD / no flow-limiting lesions on Cath; CPX February 2022: 1200 exercise, reached 91% image PRN.  Peak VO2 26.5-normal= excellent functional capacity.  High anaerobic threshold suggest excellent trending effect.  At peak exercise, limited by obesity.:  Normal CPX   GERD (gastroesophageal reflux disease)    Hyperlipidemia    Hypertension    Myocardial infarction - Type 2a MI 2015    Felt to be Type II MI-demand infarct   Neuromuscular disorder (HCC)    Obstructive sleep apnea    Cpap as of 27 days ago   Sleep apnea    wears cpap    Syncope    due to hypotension from BP meds    Past Surgical History:  Procedure Laterality Date   CARDIOPULMONARY STRESS TEST  03/17/2020   PreEx PFTs: FVC 4.33 (105%), FEV1 3.23 (104%) FEV1/FVC 75 (98% ) MVV 90 (73%): NORMAL; Exercise 12 min;hip and knee fatigue and mild dyspnea.  Rest HR 80 bpm-> max HR 135 bpm (91% of MPHR); BP 124/76 up to 166/74 mmHg; good exercise effort. Peak VO2 26.5 (108% predicted); VE/VCO2 slope 30.  Peak RER 1.1, ventilator threshold 20.6 (84% predicted, 78% measured peak VO2).  Pulse ox remained 94 to 99%   CARPAL TUNNEL RELEASE Left 02/2013   COLONOSCOPY     INGUINAL HERNIA REPAIR Left 1983   with vasectomy   INTRAMEDULLARY (IM) NAIL INTERTROCHANTERIC Left 07/29/2022   Procedure: INTRAMEDULLARY (IM) NAIL INTERTROCHANTERIC;  Surgeon: Yolonda Kida, MD;  Location: [facility] OR;  Service: Orthopedics;   Laterality: Left;   LEFT HEART CATH AND CORONARY ANGIOGRAPHY  10/20/2009   (In the setting of MI with trivial troponin elevation). LM normal --<LAD, LCx. LAD (2 small Diags) - no significant disease, LCx (co-dom with PDA) & 2 OMs (OM2 moderate size with faint "hazy" defect - not flow limiting. Co-dom RCA with minor irregularities.  EF ~60%. ? Mild Inf HK. --> Med Rx.  Felt to be Type II MI-demand infarct   POLYPECTOMY     SPERMATOCELECTOMY  1998   TRANSTHORACIC ECHOCARDIOGRAM  10/2012; 12/2015   a) EF 55 to 60%.  GR 1 DD.  Normal motion.  Normal valves.;; b) Normal LV size and function.  EF 55 to 60%.  GR 2 DD.  Normal valves.   VASECTOMY  1983   with inguinal hernia repair    Allergies  Allergies  Allergen Reactions   Dilaudid [Hydromorphone Hcl] Palpitations   Amiodarone Other (See Comments)    Poor appetite   Bisoprolol-Hydrochlorothiazide     REACTION: muscle pain, cramps   Codeine Nausea Only and Itching   Imipramine Other (See Comments)    Dry mouth, dizziness, headaches, increased sweating    Tamsulosin Other (See Comments)    Dizziness     History of Present Illness    Randall Reeves is a PMH of hypertension, hyperlipidemia, OSA on CPAP, nonobstructive CAD (underwent cardiac catheterization  in 2016), splenic and renal artery aneurysms, left femur IM nailing due to mechanical fall, and newly recognized paroxysmal atrial fibrillation.  He underwent IM nailing for femur fracture.  He was admitted 07/29/2022 and discharged on 08/03/2022.  Cardiology was consulted to evaluate new onset atrial fibrillation.  It was felt that his atrial fibrillation Alvi have been brought on by physiological stressors versus OSA.  His initial PSA was elevated however repeat free T4 was normal.  His echocardiogram was reassuring.  He was started on IV amiodarone and discharged on oral amiodarone.  Anticoagulation was started and recommended for 2 to 3 months.  AV nodal blocking agent was not recommended due to  orthostasis with admission.  He presented to the clinic 08/24/22 for follow-up evaluation and stated he had moved home from [facility] and was waiting on home physical therapy.  He had been performing physical therapy exercises on his own at home.  He did have some slight generalized nonpitting swelling in his left lower extremity.  We reviewed his hospitalization.  He had not had any further episodes of accelerated heartbeat or palpitations.  He reported compliance with his apixaban and amiodarone.  He denied bleeding issues.  We reviewed bleeding precautions for apixaban.  He and his wife expressed understanding.  I  planned for 30-day cardiac event monitor in 1 month and if no atrial fibrillation was noted we planned to discontinue apixaban and decrease amiodarone to 100 mg for 2 weeks before discontinuation.  Patient and his wife expressed understanding.  Cardiac event monitor has not yet resulted.  He presents to the clinic today for follow-up evaluation and states he continues to progress with his physical activity.  He has now graduated from his walker to a cane.  He is doing physical therapy twice per week.  He denies recurrent episodes of accelerated or irregular heartbeat.  He reports compliance with his medication.  I will give him samples of apixaban and have him continue his diltiazem.  We are awaiting results of cardiac event monitor.  Once results of cardiac event monitor have been confirmed we will proceed with either discontinuing anticoagulation therapy or continuing with current regimen.  Will plan follow-up in 6 months.  Today he denies chest pain, shortness of breath, lower extremity edema, fatigue, palpitations, melena, hematuria, hemoptysis, diaphoresis, weakness, presyncope, syncope, orthopnea, and PND.    Home Medications    Prior to Admission medications   Medication Sig Start Date End Date Taking? Authorizing Provider  acetaminophen (TYLENOL) 500 MG tablet Take 1,000 mg by  mouth every 8 (eight) hours as needed.    [provider]  amiodarone (PACERONE) 200 MG tablet Take 1 tablet (200 mg total) by mouth daily. 08/19/22   Sharon Seller, NP  apixaban (ELIQUIS) 5 MG TABS tablet Take 1 tablet (5 mg total) by mouth 2 (two) times daily. 08/19/22   Sharon Seller, NP  HYDROcodone-acetaminophen (NORCO/VICODIN) 5-325 MG tablet Take 1 tablet by mouth every 6 (six) hours as needed for moderate pain or severe pain. 08/17/22   Sharon Seller, NP  losartan (COZAAR) 50 MG tablet Take 1 tablet (50 mg total) by mouth daily. 08/19/22   Sharon Seller, NP  melatonin 3 MG TABS tablet Take 3 mg by mouth at bedtime.    [provider]  Multiple Vitamin (MULTIVITAMIN) tablet Take 1 tablet by mouth daily.    [provider]  omeprazole (PRILOSEC) 20 MG capsule Take 1 capsule (20 mg total) by mouth  daily. 08/19/22   Sharon Seller, NP  polyethylene glycol (MIRALAX / GLYCOLAX) 17 g packet Take 17 g by mouth 2 (two) times daily. 08/03/22   Osvaldo Shipper, MD  rosuvastatin (CRESTOR) 10 MG tablet TAKE ONE TABLET BY MOUTH ONCE DAILY 08/19/22   Sharon Seller, NP  sennosides-docusate sodium (SENOKOT-S) 8.6-50 MG tablet Take 1 tablet by mouth daily as needed for constipation.    [provider]  tamsulosin (FLOMAX) 0.4 MG CAPS capsule Take 1 capsule (0.4 mg total) by mouth daily. 08/19/22   Sharon Seller, NP  Zinc Oxide (TRIPLE PASTE) 12.8 % ointment Apply 1 Application topically. Every shift.    [provider]    Family History    Family History  Problem Relation Age of Onset   Stroke Mother    Hypertension Mother    Diabetes Father    Asthma Father    Heart failure Father    Heart disease Father    Hypertension Sister    Diabetes Sister    Neuropathy Brother    Hypertension Brother    Diabetes Brother    Heart disease Brother    Prostate cancer Brother    Hypertension Brother    Colon polyps Brother     Hypertension Brother    Hypertension Brother    Rectal cancer Neg Hx    Stomach cancer Neg Hx    Esophageal cancer Neg Hx    Colon cancer Neg Hx    He indicated that his mother is deceased. He indicated that his father is deceased. He indicated that his sister is alive. He indicated that two of his four brothers are alive. He indicated that his maternal grandmother is deceased. He indicated that his maternal grandfather is deceased. He indicated that his paternal grandmother is deceased. He indicated that his paternal grandfather is deceased. He indicated that both of his sons are alive. He indicated that the status of his neg hx is unknown.  Social History    Social History   Socioeconomic History   Marital status: Married    Spouse name: Not on file   Number of children: 2   Years of education: Not on file   Highest education level: Not on file  Occupational History   Occupation: landscaping  Tobacco Use   Smoking status: Never    Passive exposure: Past   Smokeless tobacco: Never  Vaping Use   Vaping status: Never Used  Substance and Sexual Activity   Alcohol use: Yes    Alcohol/week: 3.0 - 5.0 standard drinks of alcohol    Types: 3 - 5 Cans of beer per week    Comment:  a few beers in a week.   Drug use: No   Sexual activity: Not on file  Other Topics Concern   Not on file  Social History Narrative   Has living will   Wife is his health care POA--alternate is son   Would accept resuscitation attempts.   Would leave feeding tube decision to his wife   Social Determinants of Health   Financial Resource Strain: Not on file  Food Insecurity: No Food Insecurity (07/31/2022)   Hunger Vital Sign    Worried About Running Out of Food in the Last Year: Never true    Ran Out of Food in the Last Year: Never true  Transportation Needs: No Transportation Needs (07/31/2022)   PRAPARE - Administrator, Civil Service (Medical): No    Lack of Transportation (  Non-Medical):  No  Physical Activity: Not on file  Stress: Not on file  Social Connections: Not on file  Intimate Partner Violence: Not At Risk (07/31/2022)   Humiliation, Afraid, Rape, and Kick questionnaire    Fear of Current or Ex-Partner: No    Emotionally Abused: No    Physically Abused: No    Sexually Abused: No     Review of Systems    General:  No chills, fever, night sweats or weight changes.  Cardiovascular:  No chest pain, dyspnea on exertion, edema, orthopnea, palpitations, paroxysmal nocturnal dyspnea. Dermatological: No rash, lesions/masses Respiratory: No cough, dyspnea Urologic: No hematuria, dysuria Abdominal:   No nausea, vomiting, diarrhea, bright red blood per rectum, melena, or hematemesis Neurologic:  No visual changes, wkns, changes in mental status. All other systems reviewed and are otherwise negative except as noted above.  Physical Exam    VS:  BP 124/84 (BP Location: Left Arm, Patient Position: Sitting, Cuff Size: Normal)   Pulse 90   Ht 5\' 10"  (1.778 m)   Wt 190 lb 6.4 oz (86.4 kg)   SpO2 98%   BMI 27.32 kg/m  , BMI Body mass index is 27.32 kg/m. GEN: Well nourished, well developed, in no acute distress. HEENT: normal. Neck: Supple, no JVD, carotid bruits, or masses. Cardiac: RRR, no murmurs, rubs, or gallops. No clubbing, cyanosis, edema.  Radials/DP/PT 2+ and equal bilaterally.  Respiratory:  Respirations regular and unlabored, clear to auscultation bilaterally. GI: Soft, nontender, nondistended, BS + x 4. MS: no deformity or atrophy. Skin: warm and dry, no rash. Neuro:  Strength and sensation are intact. Psych: Normal affect.  Accessory Clinical Findings    Recent Labs: 07/29/2022: ALT 25 08/01/2022: Magnesium 2.0; TSH 3.813 08/05/2022: BUN 21; Creatinine 1.2; Hemoglobin 8.8; Platelets 345; Potassium 4.0; Sodium 136   Recent Lipid Panel    Component Value Date/Time   CHOL 145 05/27/2022 0935   TRIG 217.0 (H) 05/27/2022 0935   HDL 51.70 05/27/2022 0935    CHOLHDL 3 05/27/2022 0935   VLDL 43.4 (H) 05/27/2022 0935   LDLCALC 78 05/22/2021 0931   LDLDIRECT 68.0 05/27/2022 0935         ECG personally reviewed by me today- none today.   Echocardiogram 08/01/2022  1. Left ventricular ejection fraction, by estimation, is 60 to 65%. The  left ventricle has normal function. The left ventricle has no regional  wall motion abnormalities. Left ventricular diastolic parameters are  consistent with Grade I diastolic  dysfunction (impaired relaxation).   2. Right ventricular systolic function is normal. The right ventricular  size is normal. Tricuspid regurgitation signal is inadequate for assessing  PA pressure.   3. The mitral valve is grossly normal. Trivial mitral valve  regurgitation.   4. The aortic valve is tricuspid. Aortic valve regurgitation is not  visualized.   5. The inferior vena cava is normal in size with greater than 50%  respiratory variability, suggesting right atrial pressure of 3 mmHg.   Comparison(s): Prior images unable to be directly viewed, comparison made  by report only.    Cardiac event monitor-results have not yet been confirmed.   Assessment & Plan   1.  Paroxysmal atrial fibrillation-HR today 90 and regular.  Denies symptoms of atrial fibrillation.    Not a candidate for AV nodal blocking agent due to history of orthostasis.  Denies episodes of irregular and accelerated heartbeat.  This patients CHA2DS2-VASc Score and unadjusted Ischemic Stroke Rate (% per year) is equal to  4.8 % stroke rate/year from a score of 4 Above score calculated as 1 point each if present [facility] V[facility]=MI/PAD/Aortic Plaque] Above score calculated as 2 points each if present [Age > 75] Continue apixaban, samples given Continue diltiazem Avoid triggers caffeine, chocolate, EtOH, dehydration etc. Await results of cardiac event monitor-will reach out to Dr. Herbie Baltimore  Addendum to original note-Dr. Herbie Baltimore and I reviewed patient's cardiac  event monitor results.  He was noted to have 2 episodes of atrial fibrillation with the longest being greater than 49 minutes.  His episode was late in the evening.  After reviewing this our recommendations are that he continue his apixaban and diltiazem medication.  See CHA2DS2-VASc above.  Essential hypertension-BP today 124/84. Continue to maintain blood pressure log Continue losartan  Hyperlipidemia-LDL 78 on 05/22/21. Continue rosuvastatin High-fiber diet Follows with PCP   Disposition: Follow-up with Dr. Herbie Baltimore or me in 6 months.   [name]. Corinda Ammon NP-C     11/24/2022, 11:54 AM [facility] [address] Office [phone] Fax [phone]    I spent 14 minutes examining this patient, reviewing medications, and using patient centered shared decision making involving her cardiac care.  Prior to her visit I spent greater than 20 minutes reviewing her past medical history,  medications, and prior cardiac tests.

## 2022-11-24 ENCOUNTER — Ambulatory Visit: Payer: PPO | Attending: General Practice | Admitting: General Practice

## 2022-11-24 ENCOUNTER — Encounter: Payer: Self-pay | Admitting: General Practice

## 2022-11-24 VITALS — BP 124/84 | HR 90 | Ht 70.0 in | Wt 190.4 lb

## 2022-11-24 DIAGNOSIS — I480 Paroxysmal atrial fibrillation: Secondary | ICD-10-CM | POA: Diagnosis not present

## 2022-11-24 DIAGNOSIS — E782 Mixed hyperlipidemia: Secondary | ICD-10-CM | POA: Diagnosis not present

## 2022-11-24 DIAGNOSIS — I10 Essential (primary) hypertension: Secondary | ICD-10-CM | POA: Diagnosis not present

## 2022-11-24 MED ORDER — APIXABAN 5 MG PO TABS: 5.0000 mg | ORAL_TABLET | Freq: Two times a day (BID) | ORAL | Status: DC

## 2022-11-24 MED ORDER — APIXABAN 5 MG PO TABS: 5.0000 mg | ORAL_TABLET | Freq: Two times a day (BID) | ORAL | 0 refills | Status: DC

## 2022-11-24 NOTE — Patient Instructions (Addendum)
Medication Instructions:  No changes *If you need a refill on your cardiac medications before your next appointment, please call your pharmacy*   Lab Work: No labs  Testing/Procedures:  Follow-Up: At [facility], you and your health needs are our priority.  As part of our continuing mission to provide you with exceptional heart care, we have created designated Provider Care Teams.  These Care Teams include your primary Cardiologist (physician) and Advanced Practice Providers (APPs -  Physician Assistants and Nurse Practitioners) who all work together to provide you with the care you need, when you need it.  We recommend signing up for the patient portal called "MyChart".  Sign up information is provided on this After Visit Summary.  MyChart is used to connect with patients for Virtual Visits (Telemedicine).  Patients are able to view lab/test results, encounter notes, upcoming appointments, etc.  Non-urgent messages can be sent to your provider as well.   To learn more about what you can do with MyChart, go to [email or web address].    Your next appointment:   6 month(s)  Provider:   Bryan Lemma, MD  or Edd Fabian, FNP  Other Instructions Avoid any stressors for palpitations such as excess caffeine, excess alcohol, and chocolate.

## 2022-11-24 NOTE — Addendum Note (Signed)
Addended by: Clotilde Dieter on: 11/24/2022 12:26 PM   Modules accepted: Orders

## 2022-11-26 ENCOUNTER — Telehealth: Payer: Self-pay | Admitting: General Practice

## 2022-11-26 DIAGNOSIS — M25552 Pain in left hip: Secondary | ICD-10-CM | POA: Diagnosis not present

## 2022-11-26 NOTE — Telephone Encounter (Signed)
Please call Randall Reeves and let him know that his cardiac event monitor results have been reviewed.  He was noted to have 2 episodes of atrial fibrillation.  The longest episode lasted greater than 49 minutes.  Due to his cardiac event monitor results and elevated risk for stroke.  Dr. Herbie Baltimore and I would recommend that he  continue his diltiazem and apixaban.  Thank you.  [name]. Yordan Martindale NP-C     11/26/2022, 7:30 AM [facility] [address] Office [phone] Fax [phone]

## 2022-11-26 NOTE — Telephone Encounter (Signed)
LM2CB ON CELL, TRIED TO CALL HOME NUMBER BUT PHONE JUST KEEPS RINGING. WILL TRY AGAIN LATER.

## 2022-11-29 DIAGNOSIS — M25552 Pain in left hip: Secondary | ICD-10-CM | POA: Diagnosis not present

## 2022-11-29 NOTE — Telephone Encounter (Signed)
Patients wife called back. Advised that Jesse's nurse called in regards to monitor results, she states that she looked up the results on google and thinks that they understand. She stated that the nurse did not need to c/b. Will forward to Hunter to let her know and in case she does want to follow up with the patient and his wife.

## 2022-11-29 NOTE — Telephone Encounter (Signed)
Noted  

## 2022-11-30 ENCOUNTER — Ambulatory Visit: Payer: PPO | Admitting: Nurse Practitioner

## 2022-11-30 ENCOUNTER — Encounter: Payer: Self-pay | Admitting: Nurse Practitioner

## 2022-11-30 VITALS — BP 122/82 | HR 89 | Temp 98.0°F | Ht 70.0 in | Wt 187.0 lb

## 2022-11-30 DIAGNOSIS — S72142D Displaced intertrochanteric fracture of left femur, subsequent encounter for closed fracture with routine healing: Secondary | ICD-10-CM | POA: Diagnosis not present

## 2022-11-30 DIAGNOSIS — G4733 Obstructive sleep apnea (adult) (pediatric): Secondary | ICD-10-CM | POA: Diagnosis not present

## 2022-11-30 NOTE — Assessment & Plan Note (Signed)
Recovering well. Working with PT. Follow up with ortho as scheduled

## 2022-11-30 NOTE — Assessment & Plan Note (Addendum)
OSA on CPAP. Received a new CPAP machine. Good compliance and control on current settings. Advised to try setting a timer so this reminds him to get out of the recliner and in the bed, with CPAP. Encouraged to continue using nightly. Aware of proper care/use. Receives benefit from use. Safe driving practices reviewed.   Patient Instructions  Continue to use CPAP every night, minimum of 4-6 hours a night.  Change equipment as directed. Wash your tubing with warm soap and water daily, hang to dry. Wash humidifier portion weekly. Use bottled, distilled water and change daily Be aware of reduced alertness and do not drive or operate heavy machinery if experiencing this or drowsiness.  Exercise encouraged, as tolerated. Healthy weight management discussed.  Avoid or decrease alcohol consumption and medications that make you more sleepy, if possible. Notify if persistent daytime sleepiness occurs even with consistent use of PAP therapy.  Glad you're feeling better after your fall!  Follow up in one year with Randall Maurine Mowbray,NP, or sooner, if needed

## 2022-11-30 NOTE — Patient Instructions (Signed)
Continue to use CPAP every night, minimum of 4-6 hours a night.  Change equipment as directed. Wash your tubing with warm soap and water daily, hang to dry. Wash humidifier portion weekly. Use bottled, distilled water and change daily Be aware of reduced alertness and do not drive or operate heavy machinery if experiencing this or drowsiness.  Exercise encouraged, as tolerated. Healthy weight management discussed.  Avoid or decrease alcohol consumption and medications that make you more sleepy, if possible. Notify if persistent daytime sleepiness occurs even with consistent use of PAP therapy.  Glad you're feeling better after your fall!  Follow up in one year with Katie Skylynn Burkley,NP, or sooner, if needed

## 2022-11-30 NOTE — Progress Notes (Signed)
@Patient  ID: Randall Reeves, male    DOB: Nov 29, 1945, 77 y.o.   MRN: [id number]  Chief Complaint  Patient presents with   Follow-up    New CPAP - sleeping good.    Referring provider: [name], MD  HPI: 77 year old male, never smoker followed for severe OSA on CPAP. He is a patient of Dr. Evlyn Courier and last seen 03/05/2022 by Randall Kurka,NP. Past medical history significant for HTN, CAD, allergic rhinitis, GERD, CKD, HLD.  TEST/EVENTS:  10/15/2004 PSG: RDI 22, SpO2 low 85% 04/05/2017 HST: AHI 66.3, SpO2 low 81%  10/10/2019: OV with Kandice Robinsons, NP.  Follow-up for sleep therapy.  Doing well with his CPAP.  He did have a period of 10 days that he did not wear it because his wife was in the hospital.  Otherwise, wears it nightly.  Does sleep in a recliner due to acid reflux.  Going to try to get adjustable bed.  Not having any daytime sleepiness.  Wakes up 1-2 times a night to use the restroom.  Average AHI on download was 6.3.  03/05/2022: OV with Kiarrah Rausch NP for overdue follow-up.  He was last seen in 2021.  Since then, he has been lost to follow-up.  He has been doing well with his CPAP though.  Feels like he sleeps very well with it.  Does not have any significant daytime fatigue symptoms.  If he does not wear his CPAP, he is very tired the next day and usually naps in his chair.  Denies any drowsy driving or sleep parasomnia/paralysis.  His machine has recently started making a rattling noise.  It is very loud and wakes him and his wife up at times.  He has to put a book under the water chamber to keep it from doing this.  He did have a few nights on his download that he did not wear his CPAP due to the noise.  He was told by [employer] that he would need an office visit to repair the machine. 02/02/2022-03/03/2022: CPAP 7-15 cmH2O 30/30 days; 87% >4hr; average use 6 hours 7 minutes Pressure 95th blood 0.5 Leaks 95th 2.7 AHI 6  11/30/2022: Today - follow up Patient presents today for follow up. Since he was  here last, he had a fall in July resulting in a femur fracture. He was hospitalized following IM nail and discharged to rehab. He was able to come home and has been recovering well, working with PT. He's now able to walk with just a cane instead of having to use a walker.  He got his new CPAP since he was here last. He sleeps well with it. He does fall asleep in his chair almost every night but usually gets up and goes to the bed around 930/10 pm. He'll put his CPAP on then and wear it the rest of the night. Wakes feeling rested most days. Denies any issues with leaks. No drowsy driving.   10/31/2022-11/29/2022: CPAP 4-20 cmH2O 28/30 days; 87% >4 hr; average use 5 hr 57 min Pressure 95th 10.7 Leaks 95th 6.7 AHI 5.8  Allergies  Allergen Reactions   Dilaudid [Hydromorphone Hcl] Palpitations   Amiodarone Other (See Comments)    Poor appetite   Bisoprolol-Hydrochlorothiazide     REACTION: muscle pain, cramps   Codeine Nausea Only and Itching   Imipramine Other (See Comments)    Dry mouth, dizziness, headaches, increased sweating    Tamsulosin Other (See Comments)    Dizziness  Immunization History  Administered Date(s) Administered   Fluad Quad(high Dose 65+) 11/09/2018   Influenza Whole 12/16/2011   Influenza, High Dose Seasonal PF 11/10/2014, 11/09/2015   Influenza,inj,Quad PF,6+ Mos 10/30/2012   Influenza-Unspecified 11/08/2013, 11/12/2016, 10/25/2017, 09/26/2019, 11/09/2021   PFIZER(Purple Top)SARS-COV-2 Vaccination 03/24/2019, 04/01/2019, 04/22/2019, 10/26/2019, 12/07/2019   Pneumococcal Conjugate-13 01/08/2014   Pneumococcal Polysaccharide-23 10/30/2012   Td 10/01/2005   Tdap 02/18/2016   Zoster Recombinant(Shingrix) 11/27/2018, 04/18/2019   Zoster, Live 10/20/2010    Past Medical History:  Diagnosis Date   Allergy    Arthritis    Cataract    CKD (chronic kidney disease), stage III (HCC)    Coronary artery disease, non-occlusive 09/2009   Minimal CAD / no  flow-limiting lesions on Cath; CPX February 2022: 1200 exercise, reached 91% image PRN.  Peak VO2 26.5-normal= excellent functional capacity.  High anaerobic threshold suggest excellent trending effect.  At peak exercise, limited by obesity.:  Normal CPX   GERD (gastroesophageal reflux disease)    Hyperlipidemia    Hypertension    Myocardial infarction - Type 2a MI 2015    Felt to be Type II MI-demand infarct   Neuromuscular disorder (HCC)    Obstructive sleep apnea    Cpap as of 27 days ago   Sleep apnea    wears cpap    Syncope    due to hypotension from BP meds     Tobacco History: Social History   Tobacco Use  Smoking Status Never   Passive exposure: Past  Smokeless Tobacco Never   Counseling given: Not Answered   Outpatient Medications Prior to Visit  Medication Sig Dispense Refill   acetaminophen (TYLENOL) 500 MG tablet Take 1,000 mg by mouth every 8 (eight) hours as needed.     apixaban (ELIQUIS) 5 MG TABS tablet TAKE ONE TABLET (5 MG TOTAL) BY MOUTH TWO (TWO) TIMES DAILY. 60 tablet 5   apixaban (ELIQUIS) 5 MG TABS tablet Take 1 tablet (5 mg total) by mouth 2 (two) times daily. 28 tablet 0   diltiazem (CARDIZEM CD) 180 MG 24 hr capsule Take 1 capsule (180 mg total) by mouth daily. 90 capsule 3   losartan (COZAAR) 50 MG tablet Take 1 tablet (50 mg total) by mouth daily. 30 tablet 0   melatonin 3 MG TABS tablet Take 3 mg by mouth at bedtime.     Multiple Vitamin (MULTIVITAMIN) tablet Take 1 tablet by mouth daily.     Omega-3 Fatty Acids (FISH OIL) 1000 MG CAPS Take 1 capsule by mouth daily at 8 pm.     omeprazole (PRILOSEC) 20 MG capsule TAKE ONE CAPSULE BY MOUTH TWICE DAILY 180 capsule 0   rosuvastatin (CRESTOR) 10 MG tablet TAKE ONE TABLET BY MOUTH ONCE DAILY 30 tablet 0   Zinc Oxide (TRIPLE PASTE) 12.8 % ointment Apply 1 Application topically. Every shift.     HYDROcodone-acetaminophen (NORCO/VICODIN) 5-325 MG tablet Take 1 tablet by mouth every 6 (six) hours as needed  for moderate pain or severe pain. (Patient not taking: Reported on 11/30/2022) 60 tablet 0   tamsulosin (FLOMAX) 0.4 MG CAPS capsule Take 1 capsule (0.4 mg total) by mouth daily. (Patient not taking: Reported on 11/30/2022) 30 capsule 0   No facility-administered medications prior to visit.     Review of Systems:   Constitutional: No weight loss or gain, night sweats, fevers, chills, fatigue, or lassitude. HEENT: No headaches, difficulty swallowing, tooth/dental problems, or sore throat. No sneezing, itching, ear ache, nasal congestion, or post  nasal drip CV:  No chest pain, orthopnea, PND, swelling in lower extremities, anasarca, dizziness, palpitations, syncope Resp: No shortness of breath with exertion or at rest. No excess mucus or change in color of mucus. No productive or non-productive. No hemoptysis. No wheezing.  No chest wall deformity GI:  +occasional heartburn, indigestion (baseline). No abdominal pain, nausea, vomiting, diarrhea, change in bowel habits, loss of appetite GU: No dysuria, change in color of urine, urgency or frequency.   Skin: No rash, lesions, ulcerations MSK:  No joint swelling. +left left pain (improving) Neuro: No dizziness or lightheadedness.  Psych: No depression or anxiety. Mood stable.     Physical Exam:  BP 122/82 (BP Location: Right Arm, Patient Position: Sitting, Cuff Size: Normal)   Pulse 89   Temp 98 F (36.7 C) (Oral)   Ht 5\' 10"  (1.778 m)   Wt 187 lb (84.8 kg)   SpO2 95%   BMI 26.83 kg/m   GEN: Pleasant, interactive, well-appearing; in no acute distress. HEENT:  Normocephalic and atraumatic. PERRLA. Sclera white. Nasal turbinates pink, moist and patent bilaterally. No rhinorrhea present. Oropharynx pink and moist, without exudate or edema. No lesions, ulcerations, or postnasal drip. Mallampati III NECK:  Supple w/ fair ROM. No JVD present. Normal carotid impulses w/o bruits. Thyroid symmetrical with no goiter or nodules palpated. No  lymphadenopathy.   CV: RRR, no m/r/g, no peripheral edema. Pulses intact, +2 bilaterally. No cyanosis, pallor or clubbing. PULMONARY:  Unlabored, regular breathing. Clear bilaterally A&P w/o wheezes/rales/rhonchi. No accessory muscle use.  GI: BS present and normoactive. Soft, non-tender to palpation. No organomegaly or masses detected. MSK: No erythema, warmth or tenderness. Cap refil <2 sec all extrem. No deformities or joint swelling noted.  Neuro: A/Ox3. No focal deficits noted.   Skin: Warm, no lesions or rashe Psych: Normal affect and behavior. Judgement and thought content appropriate.     Lab Results:  CBC    Component Value Date/Time   WBC 8.4 08/05/2022 0000   WBC 7.4 08/03/2022 0150   RBC 2.79 (A) 08/05/2022 0000   HGB 8.8 (A) 08/05/2022 0000   HCT 26 (A) 08/05/2022 0000   PLT 345 08/05/2022 0000   MCV 94.8 08/03/2022 0150   MCH 32.3 08/03/2022 0150   MCHC 34.0 08/03/2022 0150   RDW 12.3 08/03/2022 0150   LYMPHSABS 0.8 04/22/2018 2217   MONOABS 1.2 (H) 04/22/2018 2217   EOSABS 0.0 04/22/2018 2217   BASOSABS 0.1 04/22/2018 2217    BMET    Component Value Date/Time   NA 136 (A) 08/05/2022 0000   K 4.0 08/05/2022 0000   CL 102 08/05/2022 0000   CO2 27 (A) 08/05/2022 0000   GLUCOSE 104 (H) 08/03/2022 0150   BUN 21 08/05/2022 0000   CREATININE 1.2 08/05/2022 0000   CREATININE 1.20 08/03/2022 0150   CREATININE 1.38 (H) 01/10/2014 1121   CALCIUM 8.5 (A) 08/05/2022 0000   GFRNONAA >60 08/03/2022 0150   GFRAA >60 04/23/2018 0550    BNP No results found for: "BNP"   Imaging:  No results found.  Administration History     None           No data to display          No results found for: "NITRICOXIDE"      Assessment & Plan:   Obstructive sleep apnea OSA on CPAP. Received a new CPAP machine. Good compliance and control on current settings. Advised to try setting a timer so this reminds him  to get out of the recliner and in the bed, with  CPAP. Encouraged to continue using nightly. Aware of proper care/use. Receives benefit from use. Safe driving practices reviewed.   Patient Instructions  Continue to use CPAP every night, minimum of 4-6 hours a night.  Change equipment as directed. Wash your tubing with warm soap and water daily, hang to dry. Wash humidifier portion weekly. Use bottled, distilled water and change daily Be aware of reduced alertness and do not drive or operate heavy machinery if experiencing this or drowsiness.  Exercise encouraged, as tolerated. Healthy weight management discussed.  Avoid or decrease alcohol consumption and medications that make you more sleepy, if possible. Notify if persistent daytime sleepiness occurs even with consistent use of PAP therapy.  Glad you're feeling better after your fall!  Follow up in one year with Katie Khadijatou Borak,NP, or sooner, if needed    Closed comminuted intertrochanteric fracture of left femur (HCC) Recovering well. Working with PT. Follow up with ortho as scheduled    I spent 25 minutes of dedicated to the care of this patient on the date of this encounter to include pre-visit review of records, face-to-face time with the patient discussing conditions above, post visit ordering of testing, clinical documentation with the electronic health record, making appropriate referrals as documented, and communicating necessary findings to members of the patients care team.  Noemi Chapel, NP 11/30/2022  Pt aware and understands NP's role.

## 2022-12-02 DIAGNOSIS — M79652 Pain in left thigh: Secondary | ICD-10-CM | POA: Diagnosis not present

## 2022-12-03 DIAGNOSIS — M25552 Pain in left hip: Secondary | ICD-10-CM | POA: Diagnosis not present

## 2022-12-08 ENCOUNTER — Telehealth: Payer: Self-pay | Admitting: Internal Medicine

## 2022-12-08 ENCOUNTER — Encounter: Payer: Self-pay | Admitting: Internal Medicine

## 2022-12-08 ENCOUNTER — Telehealth (INDEPENDENT_AMBULATORY_CARE_PROVIDER_SITE_OTHER): Payer: PPO | Admitting: Internal Medicine

## 2022-12-08 DIAGNOSIS — U071 COVID-19: Secondary | ICD-10-CM | POA: Diagnosis not present

## 2022-12-08 MED ORDER — NIRMATRELVIR/RITONAVIR (PAXLOVID) TABLET (RENAL DOSING): 2.0000 | ORAL_TABLET | Freq: Two times a day (BID) | ORAL | 0 refills | Status: DC

## 2022-12-08 MED ORDER — NIRMATRELVIR/RITONAVIR (PAXLOVID) TABLET (RENAL DOSING): 2.0000 | ORAL_TABLET | Freq: Two times a day (BID) | ORAL | 0 refills | Status: AC

## 2022-12-08 NOTE — Telephone Encounter (Signed)
Spoke to pt. He said [facility] advised him they do have the rx. I called [facility] and canceled the order.

## 2022-12-08 NOTE — Telephone Encounter (Signed)
Patient called back and stated that his covid test was positive

## 2022-12-08 NOTE — Telephone Encounter (Signed)
Left message on VM for pt to call the office. We can add him on to the end of the morning if he wants to do a virtual visit.

## 2022-12-08 NOTE — Assessment & Plan Note (Signed)
Feels sick  Discussed paxlovid--he wants to take (take diltiazem every other day, stop rosuvastatin and cut eliquis in half while on it) Analgesis--tylenol--cough med If worsens to ER Isolate for this week--back out next week if feels okay (with mask)

## 2022-12-08 NOTE — Telephone Encounter (Signed)
Spoke to pt. He started feeling sick about 2 days ago. Prior to that, he had diarrhea for about 2 weeks. He will go get a home covid test and do that today. Has appt tomorrow.

## 2022-12-08 NOTE — Progress Notes (Signed)
Subjective:    Patient ID: Randall Reeves, male    DOB: [birth year], 77 y.o.   MRN: [id number]  HPI Video virtual visit due to COVID infection Identification done Reviewed limitations and biliing and he gave consent Participants--patient in his home and I am in my office   Started with sore throat 3 nights ago and it has worsened Very congested Coughing --some mucus (clear) Headache No fever, sweats. Has felt chilled Some myalgias No SOB Is fatigued  Current Outpatient Medications on File Prior to Visit  Medication Sig Dispense Refill   acetaminophen (TYLENOL) 500 MG tablet Take 1,000 mg by mouth every 8 (eight) hours as needed.     apixaban (ELIQUIS) 5 MG TABS tablet TAKE ONE TABLET (5 MG TOTAL) BY MOUTH TWO (TWO) TIMES DAILY. 60 tablet 5   losartan (COZAAR) 50 MG tablet Take 1 tablet (50 mg total) by mouth daily. 30 tablet 0   melatonin 3 MG TABS tablet Take 3 mg by mouth at bedtime.     omeprazole (PRILOSEC) 20 MG capsule TAKE ONE CAPSULE BY MOUTH TWICE DAILY 180 capsule 0   rosuvastatin (CRESTOR) 10 MG tablet TAKE ONE TABLET BY MOUTH ONCE DAILY 30 tablet 0   diltiazem (CARDIZEM CD) 180 MG 24 hr capsule Take 1 capsule (180 mg total) by mouth daily. 90 capsule 3   Multiple Vitamin (MULTIVITAMIN) tablet Take 1 tablet by mouth daily. (Patient not taking: Reported on 12/08/2022)     No current facility-administered medications on file prior to visit.    Allergies  Allergen Reactions   Dilaudid [Hydromorphone Hcl] Palpitations   Amiodarone Other (See Comments)    Poor appetite   Bisoprolol-Hydrochlorothiazide     REACTION: muscle pain, cramps   Codeine Nausea Only and Itching   Imipramine Other (See Comments)    Dry mouth, dizziness, headaches, increased sweating    Tamsulosin Other (See Comments)    Dizziness     Past Medical History:  Diagnosis Date   Allergy    Arthritis    Cataract    CKD (chronic kidney disease), stage III (HCC)    Coronary artery disease,  non-occlusive 09/2009   Minimal CAD / no flow-limiting lesions on Cath; CPX February 2022: 1200 exercise, reached 91% image PRN.  Peak VO2 26.5-normal= excellent functional capacity.  High anaerobic threshold suggest excellent trending effect.  At peak exercise, limited by obesity.:  Normal CPX   GERD (gastroesophageal reflux disease)    Hyperlipidemia    Hypertension    Myocardial infarction - Type 2a MI 2015    Felt to be Type II MI-demand infarct   Neuromuscular disorder (HCC)    Obstructive sleep apnea    Cpap as of 27 days ago   Sleep apnea    wears cpap    Syncope    due to hypotension from BP meds     Past Surgical History:  Procedure Laterality Date   CARDIOPULMONARY STRESS TEST  03/17/2020   PreEx PFTs: FVC 4.33 (105%), FEV1 3.23 (104%) FEV1/FVC 75 (98% ) MVV 90 (73%): NORMAL; Exercise 12 min;hip and knee fatigue and mild dyspnea.  Rest HR 80 bpm-> max HR 135 bpm (91% of MPHR); BP 124/76 up to 166/74 mmHg; good exercise effort. Peak VO2 26.5 (108% predicted); VE/VCO2 slope 30.  Peak RER 1.1, ventilator threshold 20.6 (84% predicted, 78% measured peak VO2).  Pulse ox remained 94 to 99%   CARPAL TUNNEL RELEASE Left 02/2013   COLONOSCOPY     INGUINAL HERNIA  REPAIR Left 1983   with vasectomy   INTRAMEDULLARY (IM) NAIL INTERTROCHANTERIC Left 07/29/2022   Procedure: INTRAMEDULLARY (IM) NAIL INTERTROCHANTERIC;  Surgeon: Yolonda Kida, MD;  Location: [facility] OR;  Service: Orthopedics;  Laterality: Left;   LEFT HEART CATH AND CORONARY ANGIOGRAPHY  10/20/2009   (In the setting of MI with trivial troponin elevation). LM normal --<LAD, LCx. LAD (2 small Diags) - no significant disease, LCx (co-dom with PDA) & 2 OMs (OM2 moderate size with faint "hazy" defect - not flow limiting. Co-dom RCA with minor irregularities.  EF ~60%. ? Mild Inf HK. --> Med Rx.  Felt to be Type II MI-demand infarct   POLYPECTOMY     SPERMATOCELECTOMY  1998   TRANSTHORACIC ECHOCARDIOGRAM  10/2012; 12/2015   a) EF  55 to 60%.  GR 1 DD.  Normal motion.  Normal valves.;; b) Normal LV size and function.  EF 55 to 60%.  GR 2 DD.  Normal valves.   VASECTOMY  1983   with inguinal hernia repair    Family History  Problem Relation Age of Onset   Stroke Mother    Hypertension Mother    Diabetes Father    Asthma Father    Heart failure Father    Heart disease Father    Hypertension Sister    Diabetes Sister    Neuropathy Brother    Hypertension Brother    Diabetes Brother    Heart disease Brother    Prostate cancer Brother    Hypertension Brother    Colon polyps Brother    Hypertension Brother    Hypertension Brother    Rectal cancer Neg Hx    Stomach cancer Neg Hx    Esophageal cancer Neg Hx    Colon cancer Neg Hx     Social History   Socioeconomic History   Marital status: Married    Spouse name: Not on file   Number of children: 2   Years of education: Not on file   Highest education level: Not on file  Occupational History   Occupation: Aeronautical engineer  Tobacco Use   Smoking status: Never    Passive exposure: Past   Smokeless tobacco: Never  Vaping Use   Vaping status: Never Used  Substance and Sexual Activity   Alcohol use: Yes    Alcohol/week: 3.0 - 5.0 standard drinks of alcohol    Types: 3 - 5 Cans of beer per week    Comment:  a few beers in a week.   Drug use: No   Sexual activity: Not on file  Other Topics Concern   Not on file  Social History Narrative   Has living will   Wife is his health care POA--alternate is son   Would accept resuscitation attempts.   Would leave feeding tube decision to his wife   Social Determinants of Health   Financial Resource Strain: Not on file  Food Insecurity: No Food Insecurity (07/31/2022)   Hunger Vital Sign    Worried About Running Out of Food in the Last Year: Never true    Ran Out of Food in the Last Year: Never true  Transportation Needs: No Transportation Needs (07/31/2022)   PRAPARE - Scientist, research (physical sciences) (Medical): No    Lack of Transportation (Non-Medical): No  Physical Activity: Not on file  Stress: Not on file  Social Connections: Not on file  Intimate Partner Violence: Not At Risk (07/31/2022)   Humiliation, Afraid, Rape, and Kick questionnaire  Fear of Current or Ex-Partner: No    Emotionally Abused: No    Physically Abused: No    Sexually Abused: No   Review of Systems No loss of smell or taste No N/V Wife also sick now Appetite is off--some diarrhea off and on for a couple of weeks No abdominal pain    Objective:   Physical Exam Constitutional:      Comments: Looks mildly ill but no distress  Pulmonary:     Effort: Pulmonary effort is normal. No respiratory distress.  Neurological:     Mental Status: He is alert.            Assessment & Plan:

## 2022-12-08 NOTE — Telephone Encounter (Signed)
Appt made for today. Canceled tomorrow.

## 2022-12-08 NOTE — Telephone Encounter (Signed)
Pt called stating he has a cold & would like to discuss his options over with [facility]. Told pt he'd need an appt if he wanted any rx prescribed to help him out. Pt states he'd like to speak with Carollee Herter first before scheduling anything. Call back # [phone]

## 2022-12-08 NOTE — Telephone Encounter (Signed)
Patient called in stating that [facility] doesn't carry paxlovid,however the [facility] on [address] does,if this prescription could be resent there please.

## 2022-12-09 ENCOUNTER — Ambulatory Visit: Payer: PPO | Admitting: Internal Medicine

## 2022-12-14 DIAGNOSIS — G4733 Obstructive sleep apnea (adult) (pediatric): Secondary | ICD-10-CM | POA: Diagnosis not present

## 2022-12-15 ENCOUNTER — Other Ambulatory Visit: Payer: Self-pay | Admitting: Nurse Practitioner

## 2022-12-15 ENCOUNTER — Ambulatory Visit (INDEPENDENT_AMBULATORY_CARE_PROVIDER_SITE_OTHER): Payer: PPO | Admitting: Internal Medicine

## 2022-12-15 ENCOUNTER — Other Ambulatory Visit: Payer: Self-pay | Admitting: Internal Medicine

## 2022-12-15 ENCOUNTER — Encounter: Payer: Self-pay | Admitting: Internal Medicine

## 2022-12-15 VITALS — BP 104/76 | HR 76 | Temp 98.4°F | Ht 70.0 in | Wt 182.0 lb

## 2022-12-15 DIAGNOSIS — R197 Diarrhea, unspecified: Secondary | ICD-10-CM | POA: Diagnosis not present

## 2022-12-15 DIAGNOSIS — I10 Essential (primary) hypertension: Secondary | ICD-10-CM

## 2022-12-15 DIAGNOSIS — E785 Hyperlipidemia, unspecified: Secondary | ICD-10-CM

## 2022-12-15 NOTE — Progress Notes (Signed)
Subjective:    Patient ID: Randall Reeves, male    DOB: March 07, 1945, 77 y.o.   MRN: [id number]  HPI Here due to diarrhea  Does feel better from last week's COVID infection Feels the paxlovid really helped Still having loose stools--had been part of his illness before the paxlovid Respiratory symptoms are gone  No fever His stomach rolls and doesn't feel comfortable---then moves bowels. Then symptoms improve Appetite still not good Just one stool daily---watery. No blood Restricted diet---but did have some eggs and toast for breakfast Then chicken soup, jello, etc Tried 1/2 [place] sub last night  Current Outpatient Medications on File Prior to Visit  Medication Sig Dispense Refill   acetaminophen (TYLENOL) 500 MG tablet Take 1,000 mg by mouth every 8 (eight) hours as needed.     apixaban (ELIQUIS) 5 MG TABS tablet TAKE ONE TABLET (5 MG TOTAL) BY MOUTH TWO (TWO) TIMES DAILY. 60 tablet 5   losartan (COZAAR) 50 MG tablet Take 1 tablet (50 mg total) by mouth daily. 30 tablet 0   melatonin 3 MG TABS tablet Take 3 mg by mouth at bedtime.     Multiple Vitamin (MULTIVITAMIN) tablet Take 1 tablet by mouth daily.     omeprazole (PRILOSEC) 20 MG capsule TAKE ONE CAPSULE BY MOUTH TWICE DAILY 180 capsule 0   rosuvastatin (CRESTOR) 10 MG tablet TAKE ONE TABLET BY MOUTH ONCE DAILY 30 tablet 0   diltiazem (CARDIZEM CD) 180 MG 24 hr capsule Take 1 capsule (180 mg total) by mouth daily. 90 capsule 3   No current facility-administered medications on file prior to visit.    Allergies  Allergen Reactions   Dilaudid [Hydromorphone Hcl] Palpitations   Amiodarone Other (See Comments)    Poor appetite   Bisoprolol-Hydrochlorothiazide     REACTION: muscle pain, cramps   Codeine Nausea Only and Itching   Imipramine Other (See Comments)    Dry mouth, dizziness, headaches, increased sweating    Tamsulosin Other (See Comments)    Dizziness     Past Medical History:  Diagnosis Date   Allergy     Arthritis    Cataract    CKD (chronic kidney disease), stage III (HCC)    Coronary artery disease, non-occlusive 09/2009   Minimal CAD / no flow-limiting lesions on Cath; CPX February 2022: 1200 exercise, reached 91% image PRN.  Peak VO2 26.5-normal= excellent functional capacity.  High anaerobic threshold suggest excellent trending effect.  At peak exercise, limited by obesity.:  Normal CPX   GERD (gastroesophageal reflux disease)    Hyperlipidemia    Hypertension    Myocardial infarction - Type 2a MI 2015    Felt to be Type II MI-demand infarct   Neuromuscular disorder (HCC)    Obstructive sleep apnea    Cpap as of 27 days ago   Sleep apnea    wears cpap    Syncope    due to hypotension from BP meds     Past Surgical History:  Procedure Laterality Date   CARDIOPULMONARY STRESS TEST  03/17/2020   PreEx PFTs: FVC 4.33 (105%), FEV1 3.23 (104%) FEV1/FVC 75 (98% ) MVV 90 (73%): NORMAL; Exercise 12 min;hip and knee fatigue and mild dyspnea.  Rest HR 80 bpm-> max HR 135 bpm (91% of MPHR); BP 124/76 up to 166/74 mmHg; good exercise effort. Peak VO2 26.5 (108% predicted); VE/VCO2 slope 30.  Peak RER 1.1, ventilator threshold 20.6 (84% predicted, 78% measured peak VO2).  Pulse ox remained 94 to 99%  CARPAL TUNNEL RELEASE Left 02/2013   COLONOSCOPY     INGUINAL HERNIA REPAIR Left 1983   with vasectomy   INTRAMEDULLARY (IM) NAIL INTERTROCHANTERIC Left 07/29/2022   Procedure: INTRAMEDULLARY (IM) NAIL INTERTROCHANTERIC;  Surgeon: Yolonda Kida, MD;  Location: [facility] OR;  Service: Orthopedics;  Laterality: Left;   LEFT HEART CATH AND CORONARY ANGIOGRAPHY  10/20/2009   (In the setting of MI with trivial troponin elevation). LM normal --<LAD, LCx. LAD (2 small Diags) - no significant disease, LCx (co-dom with PDA) & 2 OMs (OM2 moderate size with faint "hazy" defect - not flow limiting. Co-dom RCA with minor irregularities.  EF ~60%. ? Mild Inf HK. --> Med Rx.  Felt to be Type II MI-demand infarct    POLYPECTOMY     SPERMATOCELECTOMY  1998   TRANSTHORACIC ECHOCARDIOGRAM  10/2012; 12/2015   a) EF 55 to 60%.  GR 1 DD.  Normal motion.  Normal valves.;; b) Normal LV size and function.  EF 55 to 60%.  GR 2 DD.  Normal valves.   VASECTOMY  1983   with inguinal hernia repair    Family History  Problem Relation Age of Onset   Stroke Mother    Hypertension Mother    Diabetes Father    Asthma Father    Heart failure Father    Heart disease Father    Hypertension Sister    Diabetes Sister    Neuropathy Brother    Hypertension Brother    Diabetes Brother    Heart disease Brother    Prostate cancer Brother    Hypertension Brother    Colon polyps Brother    Hypertension Brother    Hypertension Brother    Rectal cancer Neg Hx    Stomach cancer Neg Hx    Esophageal cancer Neg Hx    Colon cancer Neg Hx     Social History   Socioeconomic History   Marital status: Married    Spouse name: Not on file   Number of children: 2   Years of education: Not on file   Highest education level: Not on file  Occupational History   Occupation: Aeronautical engineer  Tobacco Use   Smoking status: Never    Passive exposure: Past   Smokeless tobacco: Never  Vaping Use   Vaping status: Never Used  Substance and Sexual Activity   Alcohol use: Yes    Alcohol/week: 3.0 - 5.0 standard drinks of alcohol    Types: 3 - 5 Cans of beer per week    Comment:  a few beers in a week.   Drug use: No   Sexual activity: Not on file  Other Topics Concern   Not on file  Social History Narrative   Has living will   Wife is his health care POA--alternate is son   Would accept resuscitation attempts.   Would leave feeding tube decision to his wife   Social Determinants of Health   Financial Resource Strain: Not on file  Food Insecurity: No Food Insecurity (07/31/2022)   Hunger Vital Sign    Worried About Running Out of Food in the Last Year: Never true    Ran Out of Food in the Last Year: Never true   Transportation Needs: No Transportation Needs (07/31/2022)   PRAPARE - Administrator, Civil Service (Medical): No    Lack of Transportation (Non-Medical): No  Physical Activity: Not on file  Stress: Not on file  Social Connections: Not on file  Intimate  Partner Violence: Not At Risk (07/31/2022)   Humiliation, Afraid, Rape, and Kick questionnaire    Fear of Current or Ex-Partner: No    Emotionally Abused: No    Physically Abused: No    Sexually Abused: No   Review of Systems No cough or SOB now     Objective:   Physical Exam Constitutional:      Appearance: Normal appearance.  Pulmonary:     Effort: Pulmonary effort is normal.     Breath sounds: Normal breath sounds. No wheezing or rales.  Abdominal:     General: There is no distension.     Palpations: Abdomen is soft.     Tenderness: There is no abdominal tenderness. There is no guarding or rebound.  Musculoskeletal:     Cervical back: Neck supple.  Lymphadenopathy:     Cervical: No cervical adenopathy.  Neurological:     Mental Status: He is alert.            Assessment & Plan:

## 2022-12-15 NOTE — Assessment & Plan Note (Signed)
One loose stool daily over 2 weeks since having COVID No abdominal pain, etc Did tolerate some increased food yesterday---discussed advancing Can continue probiotic which he did try Expect this to resolve on its own

## 2022-12-17 DIAGNOSIS — M25552 Pain in left hip: Secondary | ICD-10-CM | POA: Diagnosis not present

## 2022-12-20 DIAGNOSIS — M25552 Pain in left hip: Secondary | ICD-10-CM | POA: Diagnosis not present

## 2022-12-22 DIAGNOSIS — M25552 Pain in left hip: Secondary | ICD-10-CM | POA: Diagnosis not present

## 2023-01-13 DIAGNOSIS — G4733 Obstructive sleep apnea (adult) (pediatric): Secondary | ICD-10-CM | POA: Diagnosis not present

## 2023-02-01 DIAGNOSIS — M25552 Pain in left hip: Secondary | ICD-10-CM | POA: Diagnosis not present

## 2023-02-13 DIAGNOSIS — G4733 Obstructive sleep apnea (adult) (pediatric): Secondary | ICD-10-CM | POA: Diagnosis not present

## 2023-03-02 DIAGNOSIS — L570 Actinic keratosis: Secondary | ICD-10-CM | POA: Diagnosis not present

## 2023-03-02 DIAGNOSIS — L821 Other seborrheic keratosis: Secondary | ICD-10-CM | POA: Diagnosis not present

## 2023-03-02 DIAGNOSIS — D2271 Melanocytic nevi of right lower limb, including hip: Secondary | ICD-10-CM | POA: Diagnosis not present

## 2023-03-02 DIAGNOSIS — Z08 Encounter for follow-up examination after completed treatment for malignant neoplasm: Secondary | ICD-10-CM | POA: Diagnosis not present

## 2023-03-02 DIAGNOSIS — D2272 Melanocytic nevi of left lower limb, including hip: Secondary | ICD-10-CM | POA: Diagnosis not present

## 2023-03-02 DIAGNOSIS — D225 Melanocytic nevi of trunk: Secondary | ICD-10-CM | POA: Diagnosis not present

## 2023-03-02 DIAGNOSIS — D2261 Melanocytic nevi of right upper limb, including shoulder: Secondary | ICD-10-CM | POA: Diagnosis not present

## 2023-03-02 DIAGNOSIS — D2262 Melanocytic nevi of left upper limb, including shoulder: Secondary | ICD-10-CM | POA: Diagnosis not present

## 2023-03-02 DIAGNOSIS — Z85828 Personal history of other malignant neoplasm of skin: Secondary | ICD-10-CM | POA: Diagnosis not present

## 2023-03-16 DIAGNOSIS — G4733 Obstructive sleep apnea (adult) (pediatric): Secondary | ICD-10-CM | POA: Diagnosis not present

## 2023-03-31 DIAGNOSIS — H40003 Preglaucoma, unspecified, bilateral: Secondary | ICD-10-CM | POA: Diagnosis not present

## 2023-04-13 DIAGNOSIS — H43811 Vitreous degeneration, right eye: Secondary | ICD-10-CM | POA: Diagnosis not present

## 2023-04-13 DIAGNOSIS — H2513 Age-related nuclear cataract, bilateral: Secondary | ICD-10-CM | POA: Diagnosis not present

## 2023-04-13 DIAGNOSIS — H40003 Preglaucoma, unspecified, bilateral: Secondary | ICD-10-CM | POA: Diagnosis not present

## 2023-04-13 DIAGNOSIS — G4733 Obstructive sleep apnea (adult) (pediatric): Secondary | ICD-10-CM | POA: Diagnosis not present

## 2023-04-13 DIAGNOSIS — H35371 Puckering of macula, right eye: Secondary | ICD-10-CM | POA: Diagnosis not present

## 2023-04-15 DIAGNOSIS — G4733 Obstructive sleep apnea (adult) (pediatric): Secondary | ICD-10-CM | POA: Diagnosis not present

## 2023-05-12 ENCOUNTER — Ambulatory Visit (INDEPENDENT_AMBULATORY_CARE_PROVIDER_SITE_OTHER)

## 2023-05-12 VITALS — BP 104/76 | Ht 70.0 in | Wt 180.0 lb

## 2023-05-12 DIAGNOSIS — Z0000 Encounter for general adult medical examination without abnormal findings: Secondary | ICD-10-CM

## 2023-05-12 NOTE — Progress Notes (Signed)
 Because this visit was a virtual/telehealth visit,  certain criteria was not obtained, such a blood pressure, CBG if applicable, and timed get up and go. Any medications not marked as "taking" were not mentioned during the medication reconciliation part of the visit. Any vitals not documented were not able to be obtained due to this being a telehealth visit or patient was unable to self-report a recent blood pressure reading due to a lack of equipment at home via telehealth. Vitals that have been documented are verbally provided by the patient.  Subjective:   Randall Reeves is a 78 y.o. who presents for a Medicare Wellness preventive visit.  Visit Complete: Virtual I connected with  Randall Reeves on 05/12/23 by a audio enabled telemedicine application and verified that I am speaking with the correct person using two identifiers.  Patient Location: Home  Provider Location: Home Office  I discussed the limitations of evaluation and management by telemedicine. The patient expressed understanding and agreed to proceed.  Vital Signs: Because this visit was a virtual/telehealth visit, some criteria [place] be missing or patient reported. Any vitals not documented were not able to be obtained and vitals that have been documented are patient reported.  VideoDeclined- This patient declined Librarian, academic. Therefore the visit was completed with audio only.  Persons Participating in Visit: Patient.  AWV Questionnaire: No: Patient Medicare AWV questionnaire was not completed prior to this visit.  Cardiac Risk Factors include: advanced age (>64men, >19 women);male gender;hypertension;Other (see comment), Risk factor comments: A fib     Objective:    Today's Vitals   05/12/23 1305  BP: 104/76  Weight: 180 lb (81.6 kg)  Height: 5\' 10"  (1.778 m)   Body mass index is 25.83 kg/m.     05/12/2023    1:17 PM 08/19/2022    8:57 AM 08/04/2022    8:57 AM 07/31/2022    4:34 PM  07/29/2022   11:51 AM 05/24/2017    2:28 PM 04/01/2016   12:48 PM  Advanced Directives  Does Patient Have a Medical Advance Directive? Yes Yes Yes  No Yes Yes  Type of Estate agent of [place];Living will Healthcare Power of [place];Living will Healthcare Power of [place];Living will   Healthcare Power of [place];Living will;Out of facility DNR (pink MOST or yellow form) Living will;Healthcare Power of Attorney  Does patient want to make changes to medical advance directive? No - Patient declined No - Patient declined No - Patient declined   No - Patient declined   Copy of Healthcare Power of Attorney in Chart? Yes - validated most recent copy scanned in chart (See row information) Yes - validated most recent copy scanned in chart (See row information) Yes - validated most recent copy scanned in chart (See row information)   No - copy requested No - copy requested  Would patient like information on creating a medical advance directive?    No - Patient declined       Current Medications (verified) Outpatient Encounter Medications as of 05/12/2023  Medication Sig   acetaminophen (TYLENOL) 500 MG tablet Take 1,000 mg by mouth every 8 (eight) hours as needed.   apixaban (ELIQUIS) 5 MG TABS tablet TAKE ONE TABLET (5 MG TOTAL) BY MOUTH TWO (TWO) TIMES DAILY.   losartan (COZAAR) 50 MG tablet TAKE ONE TABLET BY MOUTH ONCE DAILY   melatonin 3 MG TABS tablet Take 3 mg by mouth at bedtime.   Multiple Vitamin (MULTIVITAMIN) tablet Take 1  tablet by mouth daily.   omeprazole (PRILOSEC) 20 MG capsule TAKE ONE CAPSULE BY MOUTH TWICE DAILY   rosuvastatin (CRESTOR) 10 MG tablet TAKE ONE TABLET BY MOUTH ONCE DAILY   diltiazem (CARDIZEM CD) 180 MG 24 hr capsule Take 1 capsule (180 mg total) by mouth daily.   No facility-administered encounter medications on file as of 05/12/2023.    Allergies (verified) Dilaudid [hydromorphone hcl], Amiodarone, Bisoprolol-hydrochlorothiazide, Codeine,  Imipramine, and Tamsulosin   History: Past Medical History:  Diagnosis Date   Allergy    Arthritis    Cataract    CKD (chronic kidney disease), stage III (HCC)    Coronary artery disease, non-occlusive 09/2009   Minimal CAD / no flow-limiting lesions on Cath; CPX February 2022: 1200 exercise, reached 91% image PRN.  Peak VO2 26.5-normal= excellent functional capacity.  High anaerobic threshold suggest excellent trending effect.  At peak exercise, limited by obesity.:  Normal CPX   GERD (gastroesophageal reflux disease)    Hyperlipidemia    Hypertension    Myocardial infarction - Type 2a MI 2015    Felt to be Type II MI-demand infarct   Neuromuscular disorder (HCC)    Obstructive sleep apnea    Cpap as of 27 days ago   Sleep apnea    wears cpap    Syncope    due to hypotension from BP meds    Past Surgical History:  Procedure Laterality Date   CARDIOPULMONARY STRESS TEST  03/17/2020   PreEx PFTs: FVC 4.33 (105%), FEV1 3.23 (104%) FEV1/FVC 75 (98% ) MVV 90 (73%): NORMAL; Exercise 12 min;hip and knee fatigue and mild dyspnea.  Rest HR 80 bpm-> max HR 135 bpm (91% of MPHR); BP 124/76 up to 166/74 mmHg; good exercise effort. Peak VO2 26.5 (108% predicted); VE/VCO2 slope 30.  Peak RER 1.1, ventilator threshold 20.6 (84% predicted, 78% measured peak VO2).  Pulse ox remained 94 to 99%   CARPAL TUNNEL RELEASE Left 02/2013   COLONOSCOPY     INGUINAL HERNIA REPAIR Left 1983   with vasectomy   INTRAMEDULLARY (IM) NAIL INTERTROCHANTERIC Left 07/29/2022   Procedure: INTRAMEDULLARY (IM) NAIL INTERTROCHANTERIC;  Surgeon: Yolonda Kida, MD;  Location: [facility] OR;  Service: Orthopedics;  Laterality: Left;   LEFT HEART CATH AND CORONARY ANGIOGRAPHY  10/20/2009   (In the setting of MI with trivial troponin elevation). LM normal --<LAD, LCx. LAD (2 small Diags) - no significant disease, LCx (co-dom with PDA) & 2 OMs (OM2 moderate size with faint "hazy" defect - not flow limiting. Co-dom RCA with minor  irregularities.  EF ~60%. ? Mild Inf HK. --> Med Rx.  Felt to be Type II MI-demand infarct   POLYPECTOMY     SPERMATOCELECTOMY  1998   TRANSTHORACIC ECHOCARDIOGRAM  10/2012; 12/2015   a) EF 55 to 60%.  GR 1 DD.  Normal motion.  Normal valves.;; b) Normal LV size and function.  EF 55 to 60%.  GR 2 DD.  Normal valves.   VASECTOMY  1983   with inguinal hernia repair   Family History  Problem Relation Age of Onset   Stroke Mother    Hypertension Mother    Diabetes Father    Asthma Father    Heart failure Father    Heart disease Father    Hypertension Sister    Diabetes Sister    Neuropathy Brother    Hypertension Brother    Diabetes Brother    Heart disease Brother    Prostate cancer Brother  Hypertension Brother    Colon polyps Brother    Hypertension Brother    Hypertension Brother    Rectal cancer Neg Hx    Stomach cancer Neg Hx    Esophageal cancer Neg Hx    Colon cancer Neg Hx    Social History   Socioeconomic History   Marital status: Married    Spouse name: Not on file   Number of children: 2   Years of education: Not on file   Highest education level: Not on file  Occupational History   Occupation: landscaping  Tobacco Use   Smoking status: Never    Passive exposure: Past   Smokeless tobacco: Never  Vaping Use   Vaping status: Never Used  Substance and Sexual Activity   Alcohol use: Yes    Alcohol/week: 3.0 - 5.0 standard drinks of alcohol    Types: 3 - 5 Cans of beer per week    Comment:  a few beers in a week.   Drug use: No   Sexual activity: Not on file  Other Topics Concern   Not on file  Social History Narrative   Has living will   Wife is his health care POA--alternate is son   Would accept resuscitation attempts.   Would leave feeding tube decision to his wife   Social Drivers of Corporate investment banker Strain: Low Risk  (05/12/2023)   Overall Financial Resource Strain (CARDIA)    Difficulty of Paying Living Expenses: Not hard at all   Food Insecurity: No Food Insecurity (05/12/2023)   Hunger Vital Sign    Worried About Running Out of Food in the Last Year: Never true    Ran Out of Food in the Last Year: Never true  Transportation Needs: No Transportation Needs (05/12/2023)   PRAPARE - Administrator, Civil Service (Medical): No    Lack of Transportation (Non-Medical): No  Physical Activity: Sufficiently Active (05/12/2023)   Exercise Vital Sign    Days of Exercise per Week: 5 days    Minutes of Exercise per Session: 30 min  Stress: No Stress Concern Present (05/12/2023)   [employer] of Occupational Health - Occupational Stress Questionnaire    Feeling of Stress : Not at all  Social Connections: Socially Integrated (05/12/2023)   Social Connection and Isolation Panel [NHANES]    Frequency of Communication with Friends and Family: More than three times a week    Frequency of Social Gatherings with Friends and Family: More than three times a week    Attends Religious Services: More than 4 times per year    Active Member of [employer] or Organizations: Yes    Attends Engineer, structural: More than 4 times per year    Marital Status: Married    Tobacco Counseling Counseling given: Not Answered    Clinical Intake:  Pre-visit preparation completed: Yes  Pain : No/denies pain     BMI - recorded: 25.83 Nutritional Status: BMI 25 -29 Overweight Nutritional Risks: None Diabetes: No  Lab Results  Component Value Date   HGBA1C 5.9 05/27/2022   HGBA1C 6.1 05/16/2020   HGBA1C 5.9 05/15/2019     How often do you need to have someone help you when you read instructions, pamphlets, or other written materials from your doctor or pharmacy?: 1 - Never What is the last grade level you completed in school?: HS gradute  Interpreter Needed?: No  Information entered by :: [employer]   Activities of Daily  Living     05/12/2023    1:14 PM 07/31/2022    4:34 PM  In your present state of  health, do you have any difficulty performing the following activities:  Hearing? 0 0  Vision? 0 0  Difficulty concentrating or making decisions? 0 0  Walking or climbing stairs? 0 0  Dressing or bathing? 0 0  Doing errands, shopping? 0 0  Preparing Food and eating ? N   Using the Toilet? N   In the past six months, have you accidently leaked urine? Y   Comment sometimes   Do you have problems with loss of bowel control? Y   Comment sometimes   Managing your Medications? N   Managing your Finances? N   Housekeeping or managing your Housekeeping? N     Patient Care Team: [name], MD as PCP - General Marykay Lex, MD as PCP - Cardiology (Cardiology)  Indicate any recent Medical Services you [place] have received from other than [facility] providers in the past year (date Brenton be approximate).     Assessment:   This is a routine wellness examination for Sullivan.  Hearing/Vision screen Hearing Screening - Comments:: No hearing issues Vision Screening - Comments:: Wears glasses   Goals Addressed             This Visit's Progress    Patient Stated       Patient wants to feel great        Depression Screen     05/12/2023    1:18 PM 05/27/2022    9:15 AM 05/27/2022    8:42 AM 05/22/2021    9:13 AM 05/16/2020    8:31 AM 02/22/2018    9:11 AM 02/16/2017    9:25 AM  PHQ 2/9 Scores  PHQ - 2 Score 0 0 0 0 0 0 0  PHQ- 9 Score 2          Fall Risk     05/12/2023    1:16 PM 05/27/2022    9:14 AM 05/27/2022    8:42 AM 05/22/2021    9:13 AM 05/16/2020    8:31 AM  Fall Risk   Falls in the past year? 1 1 1  0 0  Number falls in past yr: 1 0 0    Injury with Fall? 1 1 1     Risk for fall due to : History of fall(s);Impaired balance/gait;Orthopedic patient  No Fall Risks    Follow up Falls prevention discussed;Falls evaluation completed Falls prevention discussed Falls evaluation completed      MEDICARE RISK AT HOME:  Medicare Risk at Home Any stairs in or around the home?:  Yes If so, are there any without handrails?: No Home free of loose throw rugs in walkways, pet beds, electrical cords, etc?: Yes Adequate lighting in your home to reduce risk of falls?: Yes Life alert?: No Use of a cane, walker or w/c?: No Grab bars in the bathroom?: Yes Shower chair or bench in shower?: No Elevated toilet seat or a handicapped toilet?: No  TIMED UP AND GO:  Was the test performed?  No  Cognitive Function: 6CIT completed        05/12/2023    1:10 PM  6CIT Screen  What Year? 0 points  What month? 0 points  What time? 0 points  Count back from 20 0 points  Months in reverse 0 points  Repeat phrase 0 points  Total Score 0 points    Immunizations Immunization  History  Administered Date(s) Administered   Fluad Quad(high Dose 65+) 11/09/2018   Influenza Whole 12/16/2011   Influenza, High Dose Seasonal PF 11/10/2014, 11/09/2015   Influenza,inj,Quad PF,6+ Mos 10/30/2012   Influenza-Unspecified 11/08/2013, 11/12/2016, 10/25/2017, 09/26/2019, 11/09/2021   PFIZER(Purple Top)SARS-COV-2 Vaccination 03/24/2019, 04/01/2019, 04/22/2019, 10/26/2019, 12/07/2019   Pneumococcal Conjugate-13 01/08/2014   Pneumococcal Polysaccharide-23 10/30/2012   Td 10/01/2005   Tdap 02/18/2016   Zoster Recombinant(Shingrix) 11/27/2018, 04/18/2019   Zoster, Live 10/20/2010    Screening Tests Health Maintenance  Topic Date Due   Hepatitis C Screening  Never done   INFLUENZA VACCINE  08/26/2023   Medicare Annual Wellness (AWV)  05/11/2024   DTaP/Tdap/Td (3 - Td or Tdap) 02/17/2026   Colonoscopy  11/27/2026   Pneumonia Vaccine 84+ Years old  Completed   Zoster Vaccines- Shingrix  Completed   HPV VACCINES  Aged Out   Meningococcal B Vaccine  Aged Out   COVID-19 Vaccine  Discontinued    Health Maintenance  Health Maintenance Due  Topic Date Due   Hepatitis C Screening  Never done   Health Maintenance Items Addressed:declined hep c screening.  Additional  Screening:  Vision Screening: Recommended annual ophthalmology exams for early detection of glaucoma and other disorders of the eye.  Dental Screening: Recommended annual dental exams for proper oral hygiene  Community Resource Referral / Chronic Care Management: CRR required this visit?  No   CCM required this visit?  No     Plan:     I have personally reviewed and noted the following in the patient's chart:   Medical and social history Use of alcohol, tobacco or illicit drugs  Current medications and supplements including opioid prescriptions. Patient is not currently taking opioid prescriptions. Functional ability and status Nutritional status Physical activity Advanced directives List of other physicians Hospitalizations, surgeries, and ER visits in previous 12 months Vitals Screenings to include cognitive, depression, and falls Referrals and appointments  In addition, I have reviewed and discussed with patient certain preventive protocols, quality metrics, and best practice recommendations. A written personalized care plan for preventive services as well as general preventive health recommendations were provided to patient.     Freeda Jerry, [place]   05/12/2023   After Visit Summary: (MyChart) Due to this being a telephonic visit, the after visit summary with patients personalized plan was offered to patient via MyChart   Notes: Nothing significant to report at this time.

## 2023-05-12 NOTE — Patient Instructions (Signed)
 Randall Reeves , Thank you for taking time to come for your Medicare Wellness Visit. I appreciate your ongoing commitment to your health goals. Please review the following plan we discussed and let me know if I can assist you in the future.   Referrals/Orders/Follow-Ups/Clinician Recommendations: Follow up next year for next annual wellness visit.  This is a list of the screening recommended for you and due dates:  Health Maintenance  Topic Date Due   Hepatitis C Screening  Never done   Flu Shot  08/26/2023   Medicare Annual Wellness Visit  05/11/2024   DTaP/Tdap/Td vaccine (3 - Td or Tdap) 02/17/2026   Colon Cancer Screening  11/27/2026   Pneumonia Vaccine  Completed   Zoster (Shingles) Vaccine  Completed   HPV Vaccine  Aged Out   Meningitis B Vaccine  Aged Out   COVID-19 Vaccine  Discontinued    Advanced directives: (In Chart) A copy of your advanced directives are scanned into your chart should your provider ever need it.  Next Medicare Annual Wellness Visit scheduled for next year: Yes

## 2023-05-14 DIAGNOSIS — G4733 Obstructive sleep apnea (adult) (pediatric): Secondary | ICD-10-CM | POA: Diagnosis not present

## 2023-05-16 ENCOUNTER — Telehealth: Payer: Self-pay | Admitting: Internal Medicine

## 2023-05-16 NOTE — Telephone Encounter (Signed)
 Left message for pt to call office. To do a referral, they usually require OV notes from PCP. It would be best if he started with an OV to Dr Joelle Musca. It [place] be something he can fix. He can decide if a referral to ENT is necessary.   If pt calls back, please ask if he would like to schedule with Dr Joelle Musca, first.

## 2023-05-16 NOTE — Telephone Encounter (Signed)
 Copied from CRM [id number]. Topic: Referral - Question >> May 16, 2023 12:48 PM Dyann Glaser G wrote: Reason for CRM:PT CALLED STATED HE WOULD LIKE TO SEE AN ENT, STATED HIS EARS ARE STOPPED UP AND HIS RIGHT EAR IS WORSE THAN HIS LEFT. STATED THIS HAS BEEN GOING ON FOR ABOUT A MONTH.

## 2023-05-18 ENCOUNTER — Ambulatory Visit (INDEPENDENT_AMBULATORY_CARE_PROVIDER_SITE_OTHER): Admitting: Internal Medicine

## 2023-05-18 ENCOUNTER — Encounter: Payer: Self-pay | Admitting: Internal Medicine

## 2023-05-18 ENCOUNTER — Telehealth: Payer: Self-pay

## 2023-05-18 VITALS — BP 128/82 | HR 72 | Temp 98.0°F | Ht 70.0 in | Wt 187.0 lb

## 2023-05-18 DIAGNOSIS — H6123 Impacted cerumen, bilateral: Secondary | ICD-10-CM | POA: Insufficient documentation

## 2023-05-18 DIAGNOSIS — K219 Gastro-esophageal reflux disease without esophagitis: Secondary | ICD-10-CM

## 2023-05-18 MED ORDER — OMEPRAZOLE 20 MG PO CPDR: 20.0000 mg | DELAYED_RELEASE_CAPSULE | Freq: Every day | ORAL | 3 refills | Status: AC

## 2023-05-18 NOTE — Telephone Encounter (Signed)
Pt is seeing Dr. Alphonsus SiasLetvak today

## 2023-05-18 NOTE — Telephone Encounter (Signed)
 Rx sent electronically.

## 2023-05-18 NOTE — Progress Notes (Signed)
 Subjective:    Patient ID: Randall Reeves, male    DOB: 11/07/1945, 78 y.o.   MRN: [id number]  HPI Here due to trouble hearing--feels his ears clogged  Has had trouble for a while Did try a OTC ear wax removal kit ~2 months ago--seemed to work okay Tried it again last month--didn't help  No fever No ear pain-just stopped up  Current Outpatient Medications on File Prior to Visit  Medication Sig Dispense Refill   acetaminophen  (TYLENOL ) 500 MG tablet Take 1,000 mg by mouth every 8 (eight) hours as needed.     apixaban  (ELIQUIS ) 5 MG TABS tablet TAKE ONE TABLET (5 MG TOTAL) BY MOUTH TWO (TWO) TIMES DAILY. (Patient taking differently: Take 5 mg by mouth daily in the afternoon.) 60 tablet 5   losartan  (COZAAR ) 50 MG tablet TAKE ONE TABLET BY MOUTH ONCE DAILY 90 tablet 3   melatonin 3 MG TABS tablet Take 3 mg by mouth at bedtime.     Multiple Vitamin (MULTIVITAMIN) tablet Take 1 tablet by mouth daily.     omeprazole  (PRILOSEC) 20 MG capsule TAKE ONE CAPSULE BY MOUTH TWICE DAILY (Patient taking differently: Take 20 mg by mouth daily.) 180 capsule 0   rosuvastatin  (CRESTOR ) 10 MG tablet TAKE ONE TABLET BY MOUTH ONCE DAILY 90 tablet 3   diltiazem  (CARDIZEM  CD) 180 MG 24 hr capsule Take 1 capsule (180 mg total) by mouth daily. 90 capsule 3   No current facility-administered medications on file prior to visit.    Allergies  Allergen Reactions   Dilaudid  [Hydromorphone  Hcl] Palpitations   Amiodarone  Other (See Comments)    Poor appetite   Bisoprolol-Hydrochlorothiazide     REACTION: muscle pain, cramps   Codeine Nausea Only and Itching   Imipramine  Other (See Comments)    Dry mouth, dizziness, headaches, increased sweating    Tamsulosin  Other (See Comments)    Dizziness     Past Medical History:  Diagnosis Date   Allergy    Arthritis    Cataract    CKD (chronic kidney disease), stage III (HCC)    Coronary artery disease, non-occlusive 09/2009   Minimal CAD / no flow-limiting  lesions on Cath; CPX February 2022: 1200 exercise, reached 91% image PRN.  Peak VO2 26.5-normal= excellent functional capacity.  High anaerobic threshold suggest excellent trending effect.  At peak exercise, limited by obesity.:  Normal CPX   GERD (gastroesophageal reflux disease)    Hyperlipidemia    Hypertension    Myocardial infarction - Type 2a MI 2015    Felt to be Type II MI-demand infarct   Neuromuscular disorder (HCC)    Obstructive sleep apnea    Cpap as of 27 days ago   Sleep apnea    wears cpap    Syncope    due to hypotension from BP meds     Past Surgical History:  Procedure Laterality Date   CARDIOPULMONARY STRESS TEST  03/17/2020   PreEx PFTs: FVC 4.33 (105%), FEV1 3.23 (104%) FEV1/FVC 75 (98% ) MVV 90 (73%): NORMAL; Exercise 12 min;hip and knee fatigue and mild dyspnea.  Rest HR 80 bpm-> max HR 135 bpm (91% of MPHR); BP 124/76 up to 166/74 mmHg; good exercise effort. Peak VO2 26.5 (108% predicted); VE/VCO2 slope 30.  Peak RER 1.1, ventilator threshold 20.6 (84% predicted, 78% measured peak VO2).  Pulse ox remained 94 to 99%   CARPAL TUNNEL RELEASE Left 02/2013   COLONOSCOPY     INGUINAL HERNIA REPAIR Left 1983  with vasectomy   INTRAMEDULLARY (IM) NAIL INTERTROCHANTERIC Left 07/29/2022   Procedure: INTRAMEDULLARY (IM) NAIL INTERTROCHANTERIC;  Surgeon: Janeth Medicus, MD;  Location: [facility] OR;  Service: Orthopedics;  Laterality: Left;   LEFT HEART CATH AND CORONARY ANGIOGRAPHY  10/20/2009   (In the setting of MI with trivial troponin elevation). LM normal --<LAD, LCx. LAD (2 small Diags) - no significant disease, LCx (co-dom with PDA) & 2 OMs (OM2 moderate size with faint "hazy" defect - not flow limiting. Co-dom RCA with minor irregularities.  EF ~60%. ? Mild Inf HK. --> Med Rx.  Felt to be Type II MI-demand infarct   POLYPECTOMY     SPERMATOCELECTOMY  1998   TRANSTHORACIC ECHOCARDIOGRAM  10/2012; 12/2015   a) EF 55 to 60%.  GR 1 DD.  Normal motion.  Normal valves.;;  b) Normal LV size and function.  EF 55 to 60%.  GR 2 DD.  Normal valves.   VASECTOMY  1983   with inguinal hernia repair    Family History  Problem Relation Age of Onset   Stroke Mother    Hypertension Mother    Diabetes Father    Asthma Father    Heart failure Father    Heart disease Father    Hypertension Sister    Diabetes Sister    Neuropathy Brother    Hypertension Brother    Diabetes Brother    Heart disease Brother    Prostate cancer Brother    Hypertension Brother    Colon polyps Brother    Hypertension Brother    Hypertension Brother    Rectal cancer Neg Hx    Stomach cancer Neg Hx    Esophageal cancer Neg Hx    Colon cancer Neg Hx     Social History   Socioeconomic History   Marital status: Married    Spouse name: Not on file   Number of children: 2   Years of education: Not on file   Highest education level: Not on file  Occupational History   Occupation: Aeronautical engineer  Tobacco Use   Smoking status: Never    Passive exposure: Past   Smokeless tobacco: Never  Vaping Use   Vaping status: Never Used  Substance and Sexual Activity   Alcohol use: Yes    Alcohol/week: 3.0 - 5.0 standard drinks of alcohol    Types: 3 - 5 Cans of beer per week    Comment:  a few beers in a week.   Drug use: No   Sexual activity: Not on file  Other Topics Concern   Not on file  Social History Narrative   Has living will   Wife is his health care POA--alternate is son   Would accept resuscitation attempts.   Would leave feeding tube decision to his wife   Social Drivers of Corporate investment banker Strain: Low Risk  (05/12/2023)   Overall Financial Resource Strain (CARDIA)    Difficulty of Paying Living Expenses: Not hard at all  Food Insecurity: No Food Insecurity (05/12/2023)   Hunger Vital Sign    Worried About Running Out of Food in the Last Year: Never true    Ran Out of Food in the Last Year: Never true  Transportation Needs: No Transportation Needs  (05/12/2023)   PRAPARE - Administrator, Civil Service (Medical): No    Lack of Transportation (Non-Medical): No  Physical Activity: Sufficiently Active (05/12/2023)   Exercise Vital Sign    Days of Exercise per  Week: 5 days    Minutes of Exercise per Session: 30 min  Stress: No Stress Concern Present (05/12/2023)   [employer] of Occupational Health - Occupational Stress Questionnaire    Feeling of Stress : Not at all  Social Connections: Socially Integrated (05/12/2023)   Social Connection and Isolation Panel [NHANES]    Frequency of Communication with Friends and Family: More than three times a week    Frequency of Social Gatherings with Friends and Family: More than three times a week    Attends Religious Services: More than 4 times per year    Active Member of [employer] or Organizations: Yes    Attends Engineer, structural: More than 4 times per year    Marital Status: Married  Catering manager Violence: Not At Risk (05/12/2023)   Humiliation, Afraid, Rape, and Kick questionnaire    Fear of Current or Ex-Partner: No    Emotionally Abused: No    Physically Abused: No    Sexually Abused: No   Review of Systems     Objective:   Physical Exam Constitutional:      Comments: Talking loud--obvious hearing problems  HENT:     Ears:     Comments: Both canals completely occluded with cerumen           Assessment & Plan:

## 2023-05-18 NOTE — Assessment & Plan Note (Addendum)
 Will try flushing Discussed home Rx more frequent to keep the wax from accumulating  Ears completely clear after flushing Hearing back to normal and the clogged feeling relieved

## 2023-05-30 ENCOUNTER — Encounter: Payer: Self-pay | Admitting: Internal Medicine

## 2023-05-30 ENCOUNTER — Ambulatory Visit (INDEPENDENT_AMBULATORY_CARE_PROVIDER_SITE_OTHER): Payer: PPO | Admitting: Internal Medicine

## 2023-05-30 VITALS — BP 110/70 | HR 77 | Ht 70.0 in | Wt 183.0 lb

## 2023-05-30 DIAGNOSIS — Z0001 Encounter for general adult medical examination with abnormal findings: Secondary | ICD-10-CM | POA: Diagnosis not present

## 2023-05-30 DIAGNOSIS — G629 Polyneuropathy, unspecified: Secondary | ICD-10-CM | POA: Diagnosis not present

## 2023-05-30 DIAGNOSIS — I480 Paroxysmal atrial fibrillation: Secondary | ICD-10-CM | POA: Diagnosis not present

## 2023-05-30 DIAGNOSIS — K591 Functional diarrhea: Secondary | ICD-10-CM

## 2023-05-30 DIAGNOSIS — I700 Atherosclerosis of aorta: Secondary | ICD-10-CM | POA: Diagnosis not present

## 2023-05-30 DIAGNOSIS — N1831 Chronic kidney disease, stage 3a: Secondary | ICD-10-CM

## 2023-05-30 DIAGNOSIS — G4733 Obstructive sleep apnea (adult) (pediatric): Secondary | ICD-10-CM | POA: Diagnosis not present

## 2023-05-30 DIAGNOSIS — Z0000 Encounter for general adult medical examination without abnormal findings: Secondary | ICD-10-CM

## 2023-05-30 LAB — LIPID PANEL
Cholesterol: 145 mg/dL (ref 0–200)
HDL: 52.4 mg/dL (ref >39.00)
LDL Cholesterol: 72 mg/dL (ref 0–99)
NonHDL: 92.68
Total CHOL/HDL Ratio: 3
Triglycerides: 101 mg/dL (ref 0.0–149.0)
VLDL: 20.2 mg/dL (ref 0.0–40.0)

## 2023-05-30 LAB — HEPATIC FUNCTION PANEL
ALT: 18 U/L (ref 0–53)
AST: 16 U/L (ref 0–37)
Albumin: 4.7 g/dL (ref 3.5–5.2)
Alkaline Phosphatase: 82 U/L (ref 39–117)
Bilirubin, Direct: 0.1 mg/dL (ref 0.0–0.3)
Total Bilirubin: 0.7 mg/dL (ref 0.2–1.2)
Total Protein: 7.8 g/dL (ref 6.0–8.3)

## 2023-05-30 LAB — CBC
HCT: 43.3 % (ref 39.0–52.0)
Hemoglobin: 14.7 g/dL (ref 13.0–17.0)
MCHC: 33.9 g/dL (ref 30.0–36.0)
MCV: 95.3 fl (ref 78.0–100.0)
Platelets: 267 10*3/uL (ref 150.0–400.0)
RBC: 4.54 Mil/uL (ref 4.22–5.81)
RDW: 13.2 % (ref 11.5–15.5)
WBC: 5.7 10*3/uL (ref 4.0–10.5)

## 2023-05-30 LAB — RENAL FUNCTION PANEL
Albumin: 4.7 g/dL (ref 3.5–5.2)
BUN: 28 mg/dL — ABNORMAL HIGH (ref 6–23)
CO2: 29 meq/L (ref 19–32)
Calcium: 9.6 mg/dL (ref 8.4–10.5)
Chloride: 100 meq/L (ref 96–112)
Creatinine, Ser: 1.41 mg/dL (ref 0.40–1.50)
GFR: 48.71 mL/min — ABNORMAL LOW (ref >60.00)
Glucose, Bld: 91 mg/dL (ref 70–99)
Phosphorus: 3.6 mg/dL (ref 2.3–4.6)
Potassium: 4.4 meq/L (ref 3.5–5.1)
Sodium: 136 meq/L (ref 135–145)

## 2023-05-30 MED ORDER — CHOLESTYRAMINE 4 G PO PACK: 4.0000 g | PACK | Freq: Two times a day (BID) | ORAL | 12 refills | Status: AC

## 2023-05-30 NOTE — Assessment & Plan Note (Signed)
Will try cholestyramine.

## 2023-05-30 NOTE — Assessment & Plan Note (Signed)
 Not working as well Asked him to check back with pulmonary

## 2023-05-30 NOTE — Progress Notes (Signed)
 Subjective:    Patient ID: Randall Reeves, male    DOB: 06/06/1945, 78 y.o.   MRN: [id number]  HPI Here for physical  Still with trouble with some diarrhea Will take imodium after a spell-- 1-2 and he will be okay for days to weeks Gets bad taste in mouth Has tried skipping dairy, salads--no clear improvement  Still working on the rehab from femur fracture Some limp still Done with formal rehab  Known renal and splenic artery aneurysms Also atherosclerosis Now seeing cardiologist for the atrial fibrillation--started with the dilaudid  for the femur repair Low burden on monitor--but on eliquis  (he cut back to once a day)  GFR in the 50's for some years Last check was slightly better though  Current Outpatient Medications on File Prior to Visit  Medication Sig Dispense Refill   acetaminophen  (TYLENOL ) 500 MG tablet Take 1,000 mg by mouth every 8 (eight) hours as needed.     apixaban  (ELIQUIS ) 5 MG TABS tablet TAKE ONE TABLET (5 MG TOTAL) BY MOUTH TWO (TWO) TIMES DAILY. (Patient taking differently: Take 5 mg by mouth daily in the afternoon.) 60 tablet 5   losartan  (COZAAR ) 50 MG tablet TAKE ONE TABLET BY MOUTH ONCE DAILY 90 tablet 3   melatonin 3 MG TABS tablet Take 3 mg by mouth at bedtime.     Multiple Vitamin (MULTIVITAMIN) tablet Take 1 tablet by mouth daily.     omeprazole  (PRILOSEC) 20 MG capsule Take 1 capsule (20 mg total) by mouth daily. 90 capsule 3   rosuvastatin  (CRESTOR ) 10 MG tablet TAKE ONE TABLET BY MOUTH ONCE DAILY 90 tablet 3   diltiazem  (CARDIZEM  CD) 180 MG 24 hr capsule Take 1 capsule (180 mg total) by mouth daily. 90 capsule 3   No current facility-administered medications on file prior to visit.    Allergies  Allergen Reactions   Dilaudid  [Hydromorphone  Hcl] Palpitations   Amiodarone  Other (See Comments)    Poor appetite   Bisoprolol-Hydrochlorothiazide     REACTION: muscle pain, cramps   Codeine Nausea Only and Itching   Imipramine  Other (See Comments)     Dry mouth, dizziness, headaches, increased sweating    Tamsulosin  Other (See Comments)    Dizziness     Past Medical History:  Diagnosis Date   Allergy    Arthritis    Cataract    CKD (chronic kidney disease), stage III (HCC)    Coronary artery disease, non-occlusive 09/2009   Minimal CAD / no flow-limiting lesions on Cath; CPX February 2022: 1200 exercise, reached 91% image PRN.  Peak VO2 26.5-normal= excellent functional capacity.  High anaerobic threshold suggest excellent trending effect.  At peak exercise, limited by obesity.:  Normal CPX   GERD (gastroesophageal reflux disease)    Hyperlipidemia    Hypertension    Myocardial infarction - Type 2a MI 2015    Felt to be Type II MI-demand infarct   Neuromuscular disorder (HCC)    Obstructive sleep apnea    Cpap as of 27 days ago   Sleep apnea    wears cpap    Syncope    due to hypotension from BP meds     Past Surgical History:  Procedure Laterality Date   CARDIOPULMONARY STRESS TEST  03/17/2020   PreEx PFTs: FVC 4.33 (105%), FEV1 3.23 (104%) FEV1/FVC 75 (98% ) MVV 90 (73%): NORMAL; Exercise 12 min;hip and knee fatigue and mild dyspnea.  Rest HR 80 bpm-> max HR 135 bpm (91% of MPHR); BP 124/76  up to 166/74 mmHg; good exercise effort. Peak VO2 26.5 (108% predicted); VE/VCO2 slope 30.  Peak RER 1.1, ventilator threshold 20.6 (84% predicted, 78% measured peak VO2).  Pulse ox remained 94 to 99%   CARPAL TUNNEL RELEASE Left 02/2013   COLONOSCOPY     INGUINAL HERNIA REPAIR Left 1983   with vasectomy   INTRAMEDULLARY (IM) NAIL INTERTROCHANTERIC Left 07/29/2022   Procedure: INTRAMEDULLARY (IM) NAIL INTERTROCHANTERIC;  Surgeon: Janeth Medicus, MD;  Location: [facility] OR;  Service: Orthopedics;  Laterality: Left;   LEFT HEART CATH AND CORONARY ANGIOGRAPHY  10/20/2009   (In the setting of MI with trivial troponin elevation). LM normal --<LAD, LCx. LAD (2 small Diags) - no significant disease, LCx (co-dom with PDA) & 2 OMs (OM2  moderate size with faint "hazy" defect - not flow limiting. Co-dom RCA with minor irregularities.  EF ~60%. ? Mild Inf HK. --> Med Rx.  Felt to be Type II MI-demand infarct   POLYPECTOMY     SPERMATOCELECTOMY  1998   TRANSTHORACIC ECHOCARDIOGRAM  10/2012; 12/2015   a) EF 55 to 60%.  GR 1 DD.  Normal motion.  Normal valves.;; b) Normal LV size and function.  EF 55 to 60%.  GR 2 DD.  Normal valves.   VASECTOMY  1983   with inguinal hernia repair    Family History  Problem Relation Age of Onset   Stroke Mother    Hypertension Mother    Diabetes Father    Asthma Father    Heart failure Father    Heart disease Father    Hypertension Sister    Diabetes Sister    Neuropathy Brother    Hypertension Brother    Diabetes Brother    Heart disease Brother    Prostate cancer Brother    Hypertension Brother    Colon polyps Brother    Hypertension Brother    Hypertension Brother    Rectal cancer Neg Hx    Stomach cancer Neg Hx    Esophageal cancer Neg Hx    Colon cancer Neg Hx     Social History   Socioeconomic History   Marital status: Married    Spouse name: Not on file   Number of children: 2   Years of education: Not on file   Highest education level: Not on file  Occupational History   Occupation: Aeronautical engineer  Tobacco Use   Smoking status: Never    Passive exposure: Past   Smokeless tobacco: Never  Vaping Use   Vaping status: Never Used  Substance and Sexual Activity   Alcohol use: Yes    Alcohol/week: 3.0 - 5.0 standard drinks of alcohol    Types: 3 - 5 Cans of beer per week    Comment:  a few beers in a week.   Drug use: No   Sexual activity: Not on file  Other Topics Concern   Not on file  Social History Narrative   Has living will   Wife is his health care POA--alternate is son   Would accept resuscitation attempts.   Would leave feeding tube decision to his wife   Social Drivers of Corporate investment banker Strain: Low Risk  (05/12/2023)   Overall  Financial Resource Strain (CARDIA)    Difficulty of Paying Living Expenses: Not hard at all  Food Insecurity: No Food Insecurity (05/12/2023)   Hunger Vital Sign    Worried About Running Out of Food in the Last Year: Never true  Ran Out of Food in the Last Year: Never true  Transportation Needs: No Transportation Needs (05/12/2023)   PRAPARE - Administrator, Civil Service (Medical): No    Lack of Transportation (Non-Medical): No  Physical Activity: Sufficiently Active (05/12/2023)   Exercise Vital Sign    Days of Exercise per Week: 5 days    Minutes of Exercise per Session: 30 min  Stress: No Stress Concern Present (05/12/2023)   [employer] of Occupational Health - Occupational Stress Questionnaire    Feeling of Stress : Not at all  Social Connections: Socially Integrated (05/12/2023)   Social Connection and Isolation Panel [NHANES]    Frequency of Communication with Friends and Family: More than three times a week    Frequency of Social Gatherings with Friends and Family: More than three times a week    Attends Religious Services: More than 4 times per year    Active Member of [employer] or Organizations: Yes    Attends Banker Meetings: More than 4 times per year    Marital Status: Married  Catering manager Violence: Not At Risk (05/12/2023)   Humiliation, Afraid, Rape, and Kick questionnaire    Fear of Current or Ex-Partner: No    Emotionally Abused: No    Physically Abused: No    Sexually Abused: No   Review of Systems  Constitutional:  Positive for fatigue. Negative for unexpected weight change.       Wears seat belt  HENT:  Positive for hearing loss. Negative for dental problem and tinnitus.        Keeps up with dentist  Eyes:  Negative for visual disturbance.       No diplopia or unilateral vision loss  Respiratory:  Negative for cough, chest tightness and shortness of breath.   Cardiovascular:  Negative for chest pain, palpitations and leg  swelling.  Gastrointestinal:  Positive for diarrhea. Negative for blood in stool.  Endocrine: Negative for polydipsia and polyuria.  Genitourinary:  Positive for frequency. Negative for difficulty urinating.       No sexual concerns  Musculoskeletal:  Positive for arthralgias. Negative for back pain and joint swelling.  Skin:  Negative for rash.       Derm monitors several spots  Allergic/Immunologic: Positive for environmental allergies. Negative for immunocompromised state.       Satisfied with loratadine  Neurological:  Positive for headaches. Negative for dizziness, syncope and light-headedness.       Neuropathy is worse---mostly numbness  Hematological:  Negative for adenopathy. Bruises/bleeds easily.  Psychiatric/Behavioral:  Positive for sleep disturbance.        Often doesn't sleep more than 6 hours per night--uses CPAP. Not clear that it helps--needs reevaluation with pulmonary Mood has not recovered since femur fracture       Objective:   Physical Exam Constitutional:      Appearance: Normal appearance.  HENT:     Mouth/Throat:     Pharynx: No oropharyngeal exudate or posterior oropharyngeal erythema.  Eyes:     Conjunctiva/sclera: Conjunctivae normal.     Pupils: Pupils are equal, round, and reactive to light.  Cardiovascular:     Rate and Rhythm: Normal rate and regular rhythm.     Pulses: Normal pulses.     Heart sounds: No murmur heard.    No gallop.  Pulmonary:     Effort: Pulmonary effort is normal.     Breath sounds: Normal breath sounds. No wheezing or rales.  Abdominal:  Palpations: Abdomen is soft.     Tenderness: There is no abdominal tenderness.  Musculoskeletal:     Cervical back: Neck supple.     Right lower leg: No edema.     Left lower leg: No edema.  Lymphadenopathy:     Cervical: No cervical adenopathy.  Skin:    Findings: No lesion or rash.  Neurological:     General: No focal deficit present.     Mental Status: He is alert and  oriented to person, place, and time.  Psychiatric:        Mood and Affect: Mood normal.        Behavior: Behavior normal.            Assessment & Plan:

## 2023-05-30 NOTE — Assessment & Plan Note (Signed)
On imaging Is on rosuvastatin

## 2023-05-30 NOTE — Assessment & Plan Note (Signed)
 No symptoms On diltiazem  Asked him to go back to twice a day with the eliquis 

## 2023-05-30 NOTE — Assessment & Plan Note (Signed)
 Has had a hard time since femur fracture--needs to try to increase activity again Probably done with cancer screening--normal colon 1.5 years ago Flu, COVID and RSV vaccines

## 2023-05-30 NOTE — Assessment & Plan Note (Signed)
 Worse numbness  Will recheck labs

## 2023-05-30 NOTE — Assessment & Plan Note (Signed)
 Persistently in high 50's--though better last time Is on losartan  52

## 2023-06-13 ENCOUNTER — Other Ambulatory Visit: Payer: Self-pay | Admitting: General Practice

## 2023-06-13 DIAGNOSIS — G4733 Obstructive sleep apnea (adult) (pediatric): Secondary | ICD-10-CM | POA: Diagnosis not present

## 2023-06-13 DIAGNOSIS — I480 Paroxysmal atrial fibrillation: Secondary | ICD-10-CM

## 2023-07-14 DIAGNOSIS — G4733 Obstructive sleep apnea (adult) (pediatric): Secondary | ICD-10-CM | POA: Diagnosis not present

## 2023-08-13 DIAGNOSIS — G4733 Obstructive sleep apnea (adult) (pediatric): Secondary | ICD-10-CM | POA: Diagnosis not present

## 2023-09-11 ENCOUNTER — Other Ambulatory Visit: Payer: Self-pay | Admitting: General Practice

## 2023-09-11 DIAGNOSIS — I480 Paroxysmal atrial fibrillation: Secondary | ICD-10-CM

## 2023-09-12 NOTE — Telephone Encounter (Signed)
 Prescription refill request for Eliquis  received. Indication: PAF Last office visit: 11/24/22  JINNY Beauvais NP Scr: 1.41 on 05/30/23 Epic Age: 78 Weight: 86.4kg  Based on above findings Eliquis  5mg  twice daily is the appropriate dose.  Refill approved.

## 2023-09-13 DIAGNOSIS — G4733 Obstructive sleep apnea (adult) (pediatric): Secondary | ICD-10-CM | POA: Diagnosis not present

## 2023-09-14 ENCOUNTER — Telehealth: Payer: Self-pay | Admitting: Cardiology

## 2023-09-14 ENCOUNTER — Other Ambulatory Visit: Payer: Self-pay | Admitting: General Practice

## 2023-09-14 MED ORDER — DILTIAZEM HCL ER COATED BEADS 180 MG PO CP24
180.0000 mg | ORAL_CAPSULE | Freq: Every day | ORAL | 3 refills | Status: AC
Start: 2023-09-14 — End: ?

## 2023-09-14 NOTE — Telephone Encounter (Signed)
 Will send message to Randall Beauvais NP to see if he wants to reorder diltiazem .

## 2023-09-14 NOTE — Telephone Encounter (Signed)
 Pt c/o medication issue:  1. Name of Medication: Tiadylt  ER  2. How are you currently taking this medication (dosage and times per day)?   1 capsule daily  3. Are you having a reaction (difficulty breathing--STAT)?   4. What is your medication issue?    Patient wants to know if he should still be taking this medication and will need a refill sent to [id number] [place], [place] - [address]

## 2023-09-14 NOTE — Telephone Encounter (Signed)
 I don't think it makes a difference if it is tradename or generic.  FYI, I have not seen him since 2022, and he was not on it back then.  He was also diagnosed with A-fib that I was unaware of because I have not seen him.  But regardless, he should make a difference generic versus tradename.   Alm Clay, MD

## 2023-09-14 NOTE — Telephone Encounter (Signed)
 Called patient back about message. Patient had diltiazem  180 mg on his list, but he is calling about Tiadylt  ER which I think is a brand name of diltiazem . Will see if it is okay to just refill diltiazem  or does patient need to be on Tiadylt . Will send to Dr. Anner for clarification.

## 2023-09-14 NOTE — Telephone Encounter (Signed)
 Called patient back about message. Informed patient that Randall Beauvais NP was okay to refill.

## 2023-10-11 DIAGNOSIS — G4733 Obstructive sleep apnea (adult) (pediatric): Secondary | ICD-10-CM | POA: Diagnosis not present

## 2023-11-09 ENCOUNTER — Ambulatory Visit

## 2023-11-09 DIAGNOSIS — Z23 Encounter for immunization: Secondary | ICD-10-CM | POA: Diagnosis not present

## 2023-12-26 ENCOUNTER — Telehealth: Payer: Self-pay | Admitting: Internal Medicine

## 2023-12-26 ENCOUNTER — Telehealth: Payer: Self-pay

## 2023-12-26 DIAGNOSIS — I10 Essential (primary) hypertension: Secondary | ICD-10-CM

## 2023-12-26 MED ORDER — LOSARTAN POTASSIUM 50 MG PO TABS: 50.0000 mg | ORAL_TABLET | Freq: Every day | ORAL | 0 refills | Status: DC

## 2023-12-26 NOTE — Addendum Note (Signed)
 Addended by: KALLIE CLOTILDA SQUIBB on: 12/26/2023 11:25 AM   Modules accepted: Orders

## 2023-12-26 NOTE — Telephone Encounter (Unsigned)
 Copied from CRM [id number]. Topic: Clinical - Medication Refill >> Dec 26, 2023 10:34 AM Avram MATSU wrote: Medication:losartan  (COZAAR ) 50 MG tablet [[id number]]  Patent has a TOC appt in [place]   Has the patient contacted their pharmacy? Yes (Agent: If no, request that the patient contact the pharmacy for the refill. If patient does not wish to contact the pharmacy document the reason why and proceed with request.) (Agent: If yes, when and what did the pharmacy advise?)  This is the patient's preferred pharmacy:  [id number] [place] [facility] - [address] [place] [address] Phone: [phone] Fax: [phone]  Is this the correct pharmacy for this prescription? Yes If no, delete pharmacy and type the correct one.   Has the prescription been filled recently? No  Is the patient out of the medication? Yes missed 2 days  Has the patient been seen for an appointment in the last year OR does the patient have an upcoming appointment? Yes  Can we respond through MyChart? Yes or text  Agent: Please be advised that Rx refills [place] take up to 3 business days. We ask that you follow-up with your pharmacy.

## 2023-12-26 NOTE — Telephone Encounter (Signed)
 Spoke to pt. Made TOC for 02-24-24. Sent refill for 90 days.

## 2023-12-27 MED ORDER — LOSARTAN POTASSIUM 50 MG PO TABS: 50.0000 mg | ORAL_TABLET | Freq: Every day | ORAL | 0 refills | Status: AC

## 2024-01-04 ENCOUNTER — Ambulatory Visit: Payer: Self-pay

## 2024-01-04 ENCOUNTER — Encounter: Payer: Self-pay | Admitting: Emergency Medicine

## 2024-01-04 ENCOUNTER — Ambulatory Visit
Admission: EM | Admit: 2024-01-04 | Discharge: 2024-01-04 | Disposition: A | Discharge status: Home / Self Care | Attending: Emergency Medicine | Admitting: Emergency Medicine

## 2024-01-04 DIAGNOSIS — J029 Acute pharyngitis, unspecified: Secondary | ICD-10-CM | POA: Insufficient documentation

## 2024-01-04 LAB — POCT RAPID STREP A (OFFICE): Rapid Strep A Screen: NEGATIVE

## 2024-01-04 MED ORDER — LIDOCAINE VISCOUS HCL 2 % MT SOLN: 15.0000 mL | OROMUCOSAL | 0 refills | Status: AC | PRN

## 2024-01-04 MED ORDER — IPRATROPIUM BROMIDE 0.03 % NA SOLN: 2.0000 | Freq: Two times a day (BID) | NASAL | 0 refills | Status: AC

## 2024-01-04 NOTE — Telephone Encounter (Signed)
 FYI Only or Action Required?: FYI only for provider: UC advised.  Patient was last seen in primary care on 05/30/2023 by Jimmy Charlie FERNS, MD.  Called Nurse Triage reporting Sore Throat.  Symptoms began several days ago.  Interventions attempted: Nothing.  Symptoms are: gradually worsening.  Triage Disposition: See Physician Within 24 Hours  Patient/caregiver understands and will follow disposition?: Yes Reason for Disposition  [1] Pus on tonsils (back of throat) AND [2] fever AND [3] swollen neck lymph nodes (glands)  Answer Assessment - Initial Assessment Questions Patient's wife Rock calling in with patient today. Patient states it feels like there is something in back of mouth, like tonsils are in the way. Asked patient if he has ever had a tonsil stone before, patient denies. No available appointments in PCP office, offered appointment tomorrow at [facility] or UC today, patient states he will go to UC today.   1. ONSET: When did the throat start hurting? (Hours or days ago)      Several days  2. SEVERITY: How bad is the sore throat? (Scale 1-10; mild, moderate or severe)     Worsening, feeling like cannot swallow  4.  VIRAL SYMPTOMS: Are there any symptoms of a cold, such as a runny nose, cough, hoarse voice or red eyes?      Some sinus drainage, but not unusual for this time of year  5. FEVER: Do you have a fever? If Yes, ask: What is your temperature, how was it measured, and when did it start?     Denies  6. PUS ON THE TONSILS: Is there pus on the tonsils in the back of your throat?     Denies, cannot see  7. OTHER SYMPTOMS: Do you have any other symptoms? (e.g., difficulty breathing, headache, rash)     Denies  Protocols used: Sore Throat-A-AH  Copied from CRM #[id number]. Topic: Clinical - Red Word Triage >> Jan 04, 2024 10:14 AM Rosina BIRCH wrote: Reason for RMF:[id number] wife called stating the patient has a bad sore throat that is getting worse and he can't   swallow

## 2024-01-04 NOTE — ED Triage Notes (Signed)
 Patient reports sore throat x 4 days. Patient has gargle with salt water and mouth wash. Rates pain 2/10.

## 2024-01-04 NOTE — Discharge Instructions (Addendum)
 Today you are evaluated for your sore throat which I do believe is related to your sinus drainage  Rapid strep test is negative for bacteria, has been sent to the lab for 3 days to determine a bacterial growth and you will be notified if this occurs  You Rackers gargle and spit lidocaine  solution every 4 hours as needed for temporary relief of your sore throat  [place] use nasal spray twice daily to help reduce sinus congestion   Bruss continue salt water gargles, Listerine and cough drops as needed   Radler use tylenol  every 6 hours as needed for pain   You Foutz follow-up at urgent care as needed

## 2024-01-04 NOTE — ED Provider Notes (Addendum)
 Randall Reeves    CSN: [id number] Arrival date & time: 01/04/24  1120      History   Chief Complaint Chief Complaint  Patient presents with   Sore Throat    HPI Randall Reeves is a 78 y.o. male.   Patient presents for evaluation of chills, sore throat and bilateral itchy ears present for 3 days.  Progressively worsening.  Had an episode where he felt as though something is stuck in her throat and difficulty swallowing, felt as if it was swollen.  Tolerable to food and liquids.  No known sick contacts prior.  Has attempted salt water gargles, Listerine gargles and cough drops with minimal relief.  Denies fever, congestion, cough.  Has been experiencing sinus drainage at baseline.    Past Medical History:  Diagnosis Date   Allergy    Arthritis    Cataract    CKD (chronic kidney disease), stage III (HCC)    Coronary artery disease, non-occlusive 09/2009   Minimal CAD / no flow-limiting lesions on Cath; CPX February 2022: 1200 exercise, reached 91% image PRN.  Peak VO2 26.5-normal= excellent functional capacity.  High anaerobic threshold suggest excellent trending effect.  At peak exercise, limited by obesity.:  Normal CPX   GERD (gastroesophageal reflux disease)    Hyperlipidemia    Hypertension    Myocardial infarction - Type 2a MI 2015    Felt to be Type II MI-demand infarct   Neuromuscular disorder (HCC)    Obstructive sleep apnea    Cpap as of 27 days ago   Sleep apnea    wears cpap    Syncope    due to hypotension from BP meds     Patient Active Problem List   Diagnosis Date Noted   Diarrhea 12/15/2022   PAF (paroxysmal atrial fibrillation) (HCC) 07/31/2022   Aortic atherosclerosis 05/16/2020   Osteoarthritis of knee 11/02/2016   Stage 3a chronic kidney disease (HCC) 12/28/2015   Renal artery aneurysm 01/14/2015   Peripheral neuropathy 05/22/2014   Advanced directives, counseling/discussion 01/08/2014   BPH with obstruction/lower urinary tract symptoms  01/08/2014   Splenic artery aneurysm 11/25/2012   Routine general medical examination at a health care facility 10/20/2010   Coronary artery disease involving native coronary artery of native heart without angina pectoris 10/18/2009   Hyperlipidemia with target LDL less than 70 05/14/2006   Obstructive sleep apnea 05/14/2006   Essential hypertension, benign 05/14/2006   ALLERGIC RHINITIS 05/14/2006   GERD 05/14/2006    Past Surgical History:  Procedure Laterality Date   CARDIOPULMONARY STRESS TEST  03/17/2020   PreEx PFTs: FVC 4.33 (105%), FEV1 3.23 (104%) FEV1/FVC 75 (98% ) MVV 90 (73%): NORMAL; Exercise 12 min;hip and knee fatigue and mild dyspnea.  Rest HR 80 bpm-> max HR 135 bpm (91% of MPHR); BP 124/76 up to 166/74 mmHg; good exercise effort. Peak VO2 26.5 (108% predicted); VE/VCO2 slope 30.  Peak RER 1.1, ventilator threshold 20.6 (84% predicted, 78% measured peak VO2).  Pulse ox remained 94 to 99%   CARPAL TUNNEL RELEASE Left 02/2013   COLONOSCOPY     INGUINAL HERNIA REPAIR Left 1983   with vasectomy   INTRAMEDULLARY (IM) NAIL INTERTROCHANTERIC Left 07/29/2022   Procedure: INTRAMEDULLARY (IM) NAIL INTERTROCHANTERIC;  Surgeon: Sharl Selinda Dover, MD;  Location: [facility] OR;  Service: Orthopedics;  Laterality: Left;   LEFT HEART CATH AND CORONARY ANGIOGRAPHY  10/20/2009   (In the setting of MI with trivial troponin elevation). LM normal --<LAD, LCx. LAD (2  small Diags) - no significant disease, LCx (co-dom with PDA) & 2 OMs (OM2 moderate size with faint hazy defect - not flow limiting. Co-dom RCA with minor irregularities.  EF ~60%. ? Mild Inf HK. --> Med Rx.  Felt to be Type II MI-demand infarct   POLYPECTOMY     SPERMATOCELECTOMY  1998   TRANSTHORACIC ECHOCARDIOGRAM  10/2012; 12/2015   a) EF 55 to 60%.  GR 1 DD.  Normal motion.  Normal valves.;; b) Normal LV size and function.  EF 55 to 60%.  GR 2 DD.  Normal valves.   VASECTOMY  1983   with inguinal hernia repair       Home  Medications    Prior to Admission medications   Medication Sig Start Date End Date Taking? Authorizing Provider  acetaminophen  (TYLENOL ) 500 MG tablet Take 1,000 mg by mouth every 8 (eight) hours as needed.    [provider]  apixaban  (ELIQUIS ) 5 MG TABS tablet TAKE 1 TABLET BY MOUTH TWICE A DAY 09/12/23   Anner Alm ORN, MD  cholestyramine  (QUESTRAN ) 4 g packet Take 1 packet (4 g total) by mouth 2 (two) times daily. 05/30/23   Jimmy Charlie FERNS, MD  diltiazem  (CARDIZEM  CD) 180 MG 24 hr capsule Take 1 capsule (180 mg total) by mouth daily. 09/14/23   Emelia Josefa HERO, NP  diltiazem  (TIADYLT  ER) 180 MG 24 hr capsule TAKE 1 CAPSULE BY MOUTH EVERY DAY 09/16/23   Emelia Josefa HERO, NP  losartan  (COZAAR ) 50 MG tablet Take 1 tablet (50 mg total) by mouth daily. 12/27/23   Webb, Padonda B, FNP  melatonin 3 MG TABS tablet Take 3 mg by mouth at bedtime.    [provider]  Multiple Vitamin (MULTIVITAMIN) tablet Take 1 tablet by mouth daily.    [provider]  omeprazole  (PRILOSEC) 20 MG capsule Take 1 capsule (20 mg total) by mouth daily. 05/18/23   Jimmy Charlie FERNS, MD  rosuvastatin  (CRESTOR ) 10 MG tablet TAKE ONE TABLET BY MOUTH ONCE DAILY 12/15/22   Jimmy Charlie FERNS, MD    Family History Family History  Problem Relation Age of Onset   Stroke Mother    Hypertension Mother    Diabetes Father    Asthma Father    Heart failure Father    Heart disease Father    Hypertension Sister    Diabetes Sister    Neuropathy Brother    Hypertension Brother    Diabetes Brother    Heart disease Brother    Prostate cancer Brother    Hypertension Brother    Colon polyps Brother    Hypertension Brother    Hypertension Brother    Rectal cancer Neg Hx    Stomach cancer Neg Hx    Esophageal cancer Neg Hx    Colon cancer Neg Hx     Social History Social History   Tobacco Use   Smoking status: Never    Passive exposure: Past   Smokeless tobacco: Never  Vaping Use   Vaping  status: Never Used  Substance Use Topics   Alcohol use: Yes    Alcohol/week: 3.0 - 5.0 standard drinks of alcohol    Types: 3 - 5 Cans of beer per week    Comment:  a few beers in a week.   Drug use: No     Allergies   Dilaudid  [hydromorphone  hcl], Amiodarone , Bisoprolol-hydrochlorothiazide, Codeine, Imipramine , and Tamsulosin    Review of Systems Review of Systems  Constitutional:  Positive for  chills. Negative for activity change, appetite change, diaphoresis, fatigue, fever and unexpected weight change.  HENT:  Positive for sore throat. Negative for congestion, dental problem, drooling, ear discharge, ear pain, facial swelling, hearing loss, mouth sores, nosebleeds, postnasal drip, rhinorrhea, sinus pressure, sinus pain, sneezing, tinnitus, trouble swallowing and voice change.   Respiratory: Negative.    Gastrointestinal: Negative.      Physical Exam Triage Vital Signs ED Triage Vitals  Encounter Vitals Group     BP 01/04/24 1131 136/85     Girls Systolic BP Percentile --      Girls Diastolic BP Percentile --      Boys Systolic BP Percentile --      Boys Diastolic BP Percentile --      Pulse Rate 01/04/24 1131 84     Resp 01/04/24 1131 20     Temp 01/04/24 1131 98.1 F (36.7 C)     Temp Source 01/04/24 1131 Oral     SpO2 01/04/24 1131 99 %     Weight --      Height --      Head Circumference --      Peak Flow --      Pain Score 01/04/24 1130 2     Pain Loc --      Pain Education --      Exclude from Growth Chart --    No data found.  Updated Vital Signs BP 136/85 (BP Location: Left Arm)   Pulse 84   Temp 98.1 F (36.7 C) (Oral)   Resp 20   SpO2 99%   Visual Acuity Right Eye Distance:   Left Eye Distance:   Bilateral Distance:    Right Eye Near:   Left Eye Near:    Bilateral Near:     Physical Exam Constitutional:      Appearance: Normal appearance.  HENT:     Right Ear: Tympanic membrane, ear canal and external ear normal.     Left Ear:  Tympanic membrane, ear canal and external ear normal.     Nose: Congestion present.     Mouth/Throat:     Pharynx: No oropharyngeal exudate or posterior oropharyngeal erythema.  Eyes:     Extraocular Movements: Extraocular movements intact.  Pulmonary:     Effort: Pulmonary effort is normal.  Lymphadenopathy:     Cervical: No cervical adenopathy.  Neurological:     Mental Status: He is alert and oriented to person, place, and time.      UC Treatments / Results  Labs (all labs ordered are listed, but only abnormal results are displayed) Labs Reviewed  POCT RAPID STREP A (OFFICE)    EKG   Radiology No results found.  Procedures Procedures (including critical care time)  Medications Ordered in UC Medications - No data to display  Initial Impression / Assessment and Plan / UC Course  I have reviewed the triage vital signs and the nursing notes.  Pertinent labs & imaging results that were available during my care of the patient were reviewed by me and considered in my medical decision making (see chart for details).  Sore throat:   No erythema or exudate present on exam , rapid strep test negative, sent for culture, discussed findings with patient most likely irritation due to sinus drainage  as well as being caused by possible virus,  prescribed viscous lidocaine  and ipratropium nasal spray recommended continued over-the-counter medications and nonpharmacological supportive care and advised follow-up with urgent care if symptoms persist or  worsen  Final diagnoses:  Sore throat   Discharge Instructions   None    ED Prescriptions   None    PDMP not reviewed this encounter.   Teresa Shelba SAUNDERS, NP 01/04/24 1202    Teresa Shelba SAUNDERS, NP 01/04/24 [phone]

## 2024-01-06 ENCOUNTER — Other Ambulatory Visit: Payer: Self-pay

## 2024-01-06 DIAGNOSIS — E785 Hyperlipidemia, unspecified: Secondary | ICD-10-CM

## 2024-01-06 MED ORDER — ROSUVASTATIN CALCIUM 10 MG PO TABS: ORAL_TABLET | ORAL | 3 refills | Status: AC

## 2024-01-06 NOTE — Telephone Encounter (Signed)
 Copied from CRM #[id number]. Topic: Clinical - Medication Refill >> Jan 06, 2024  9:52 AM Brittany M wrote: Medication: rosuvastatin  (CRESTOR ) 10 MG tablet  Has the patient contacted their pharmacy? Yes (Agent: If no, request that the patient contact the pharmacy for the refill. If patient does not wish to contact the pharmacy document the reason why and proceed with request.) (Agent: If yes, when and what did the pharmacy advise?)  This is the patient's preferred pharmacy:  [id number] [address] [facility] - [address] [place] [address] Phone: [phone] Fax: [phone]  Is this the correct pharmacy for this prescription? Yes If no, delete pharmacy and type the correct one.   Has the prescription been filled recently? Yes  Is the patient out of the medication? Yes  Has the patient been seen for an appointment in the last year OR does the patient have an upcoming appointment? Yes  Can we respond through MyChart? Yes  Agent: Please be advised that Rx refills Almario take up to 3 business days. We ask that you follow-up with your pharmacy.

## 2024-01-07 LAB — CULTURE, GROUP A STREP (THRC)

## 2024-01-09 ENCOUNTER — Ambulatory Visit (HOSPITAL_COMMUNITY): Payer: Self-pay

## 2024-01-20 ENCOUNTER — Emergency Department (HOSPITAL_COMMUNITY)

## 2024-01-20 ENCOUNTER — Ambulatory Visit: Payer: Self-pay

## 2024-01-20 ENCOUNTER — Emergency Department (HOSPITAL_COMMUNITY)
Admission: EM | Admit: 2024-01-20 | Discharge: 2024-01-20 | Disposition: A | Discharge status: Home / Self Care | Attending: Emergency Medicine | Admitting: Emergency Medicine

## 2024-01-20 DIAGNOSIS — I2510 Atherosclerotic heart disease of native coronary artery without angina pectoris: Secondary | ICD-10-CM | POA: Insufficient documentation

## 2024-01-20 DIAGNOSIS — R109 Unspecified abdominal pain: Secondary | ICD-10-CM | POA: Diagnosis present

## 2024-01-20 DIAGNOSIS — Z955 Presence of coronary angioplasty implant and graft: Secondary | ICD-10-CM | POA: Diagnosis not present

## 2024-01-20 DIAGNOSIS — I129 Hypertensive chronic kidney disease with stage 1 through stage 4 chronic kidney disease, or unspecified chronic kidney disease: Secondary | ICD-10-CM | POA: Diagnosis not present

## 2024-01-20 DIAGNOSIS — N183 Chronic kidney disease, stage 3 unspecified: Secondary | ICD-10-CM | POA: Insufficient documentation

## 2024-01-20 DIAGNOSIS — N201 Calculus of ureter: Secondary | ICD-10-CM

## 2024-01-20 DIAGNOSIS — N132 Hydronephrosis with renal and ureteral calculous obstruction: Secondary | ICD-10-CM | POA: Insufficient documentation

## 2024-01-20 LAB — URINALYSIS, ROUTINE W REFLEX MICROSCOPIC
Bilirubin Urine: NEGATIVE
Glucose, UA: NEGATIVE mg/dL
Ketones, ur: NEGATIVE mg/dL
Leukocytes,Ua: NEGATIVE
Nitrite: NEGATIVE
Protein, ur: NEGATIVE mg/dL
Specific Gravity, Urine: 1.008 (ref 1.005–1.030)
pH: 7 (ref 5.0–8.0)

## 2024-01-20 LAB — CBC WITH DIFFERENTIAL/PLATELET
Abs Immature Granulocytes: 0.03 K/uL (ref 0.00–0.07)
Basophils Absolute: 0.1 K/uL (ref 0.0–0.1)
Basophils Relative: 1 %
Eosinophils Absolute: 0 K/uL (ref 0.0–0.5)
Eosinophils Relative: 0 %
HCT: 42.5 % (ref 39.0–52.0)
Hemoglobin: 14.2 g/dL (ref 13.0–17.0)
Immature Granulocytes: 0 %
Lymphocytes Relative: 6 %
Lymphs Abs: 0.7 K/uL (ref 0.7–4.0)
MCH: 31.6 pg (ref 26.0–34.0)
MCHC: 33.4 g/dL (ref 30.0–36.0)
MCV: 94.7 fL (ref 80.0–100.0)
Monocytes Absolute: 0.6 K/uL (ref 0.1–1.0)
Monocytes Relative: 6 %
Neutro Abs: 9.7 K/uL — ABNORMAL HIGH (ref 1.7–7.7)
Neutrophils Relative %: 87 %
Platelets: 278 K/uL (ref 150–400)
RBC: 4.49 MIL/uL (ref 4.22–5.81)
RDW: 12.5 % (ref 11.5–15.5)
WBC: 11.1 K/uL — ABNORMAL HIGH (ref 4.0–10.5)
nRBC: 0 % (ref 0.0–0.2)

## 2024-01-20 LAB — BASIC METABOLIC PANEL WITH GFR
Anion gap: 11 (ref 5–15)
BUN: 23 mg/dL (ref 8–23)
CO2: 25 mmol/L (ref 22–32)
Calcium: 9.6 mg/dL (ref 8.9–10.3)
Chloride: 100 mmol/L (ref 98–111)
Creatinine, Ser: 1.27 mg/dL — ABNORMAL HIGH (ref 0.61–1.24)
GFR, Estimated: 59 mL/min — ABNORMAL LOW
Glucose, Bld: 116 mg/dL — ABNORMAL HIGH (ref 70–99)
Potassium: 3.9 mmol/L (ref 3.5–5.1)
Sodium: 136 mmol/L (ref 135–145)

## 2024-01-20 NOTE — ED Triage Notes (Signed)
 Pt c.o lower back pain x 4 days, worse this morning, states he thinks he Leffler have a kidney stone. Pt denies hematuria. Some nausea

## 2024-01-20 NOTE — Telephone Encounter (Signed)
 FYI Only or Action Required?: FYI only for provider: ED advised.  Patient was last seen in primary care on 05/30/2023 by Jimmy Charlie FERNS, MD.  Called Nurse Triage reporting Flank Pain.  Symptoms began today.  Interventions attempted: OTC medications: Imodium.  Symptoms are: gradually worsening.  Triage Disposition: Go to ED Now (Notify PCP)  Patient/caregiver understands and will follow disposition?: Yes            Copied from CRM [id number]. Topic: Clinical - Red Word Triage >> Jan 20, 2024 10:52 AM Laymon HERO wrote: Red Word that prompted transfer to Nurse Triage:  Patient went to bathroom and started sweating-serious back pain- possible kidney stone-started this morning and getting worse Reason for Disposition  [1] SEVERE pain (e.g., excruciating, scale 8-10) AND [2] present > 1 hour  Answer Assessment - Initial Assessment Questions 1. LOCATION: Where does it hurt? (e.g., left, right)     Right.  2. ONSET: When did the pain start?     Woke up with the pain this morning.  3. SEVERITY: How bad is the pain? (e.g., Scale 1-10; mild, moderate, or severe)     12/10.  4. PATTERN: Does the pain come and go, or is it constant?      Constant, describes it as progressively getting worse today.  5. CAUSE: What do you think is causing the pain?     He thinks it could be a kidney stone.  6. OTHER SYMPTOMS:  Do you have any other symptoms? (e.g., fever, abdomen pain, vomiting, leg weakness, burning with urination, blood in urine)     Nausea, loose stool, broke out sweating.  Protocols used: Flank Pain-A-AH

## 2024-01-20 NOTE — ED Triage Notes (Signed)
 PT complains of right flank pain x 4 days, worse today. No hx of stones but thinks it Shappell be. No urinary complaints. Nausea without vomiting.

## 2024-01-20 NOTE — Discharge Instructions (Addendum)
 You were seen today for flank pain. While you were here we monitored your vitals, preformed a physical exam, and labs and imaging. These were all reassuring and there is no indication for any further testing or intervention in the emergency department at this time.  These revealed a right sided kidney stone.  Things to do:  - Follow up with your primary care provider within the next 1-2 weeks - Please continue to stay hydrated at home as this can help event kidney stones - We found an incidental finding on your scans today.  We found a left lower lobe lung nodule measures 1.8 x 1.5 cm.  Please follow-up with your PCP regarding this for follow-up imaging.  Return to the emergency department if you have any new or worsening symptoms including worsening flank pain, recurrent nausea or vomiting, fevers or chills at home, blood in your urine, fevers or chills, or if you have any other concerns.

## 2024-01-20 NOTE — ED Provider Notes (Signed)
 " [place] [facility] Provider Note   HPI/ROS    History obtained from patient and son.  Randall Reeves is a 78 y.o. male who presents for Flank Pain and who  has a past medical history of Allergy, Arthritis, Cataract, CKD (chronic kidney disease), stage III (HCC), Coronary artery disease, non-occlusive (09/2009), GERD (gastroesophageal reflux disease), Hyperlipidemia, Hypertension, Myocardial infarction - Type 2a MI (2015), Neuromuscular disorder (HCC), Obstructive sleep apnea, Sleep apnea, and Syncope.  Patient presents today for right-sided flank pain that started suddenly early this morning.  States that he is never had a kidney stone before, but he has a long history of them in his family.  States that he was asking a lot of his friends about what this could possibly be and they recommended he come to the emergency department to get workup for kidney stones.  Denies any chest pain, shortness breath, nausea, vomiting, headaches, or visual changes.  Endorses abdominal pain that is largely resolved at the time my evaluation.  Denies any fevers or chills at home.  Denies any history of AAA.  MDM   I have reviewed the nursing documentation, vital signs, as well as the past medical history, surgical history, family history, and social history.  Initial Assessment:  Patient hemodynamically stable on initial evaluation.  Belly exam benign at this time with no signs of rebound, guarding, or rigidity.  No signs of peritonitis or CVA tenderness concerning for pyelonephritis.  Labs obtained in first look with mild leukocytosis with left shift.  No significant anemia or thrombocytopenia.  BMP with no significant AKI or electrolyte abnormality.  UA with some red blood cells, but with no white blood cells and rare bacteria.  Patient has been afebrile at home, so doubt infected stone.  CT here with evidence of right 3 mm kidney stone at the UVJ.  Patient states his pain is largely  resolved prior to my evaluation, and he has had multiple episodes of urination here so he likely has passed the stone.  No chest pain or shortness of breath concerning for ACS because of this.  Pulses symmetric, so lower concern for aortic pathology.  Pain is also resolved.  Given unremarkable workup with no signs of AKI or UTI concerning for obstructing kidney stone or infected kidney stone feel patient safe for discharge.  Has 95% chance of passing given size.  Ornstein have already passed in the ED given symptomatic improvement.  Will hold off on Flomax  given he has had dizzinessorthostatic hypotension in the past.  Patient was informed about his lung nodule on his CT.  Also placed in the discharge instructions.  Patient discharged home in hemodynamically stable condition.  Was told to follow-up with his PCP within the next 1 to 2 weeks for recheck.  Will stone return to the emergency department for any further concerns he has.  Disposition:  I discussed the plan for discharge with the patient and/or their surrogate at bedside prior to discharge and they were in agreement with the plan and verbalized understanding of the return precautions provided. All questions answered to the best of my ability. Ultimately, the patient was discharged in stable condition with stable vital signs. I am reassured that they are capable of close follow up and good social support at home.   This patient was staffed with Dr. Franklyn who supervised the visit and agreed with the plan of care.   Due to the patients current presenting symptoms, physical exam  findings, and the workup stated above, it is thought that the etiology of the patients current presentation is:  1. Nephrolithiasis      Clinical Complexity A medically appropriate history, review of systems, and physical exam was performed.  Factors that affect the complexity of this encounter: assessment of correct protocol and laboratory work from this visit  My  independent interpretations of diagnostic studies are documented in the ED course above.   If decision rules were used in this patient's evaluation, they are listed below.   Click here for ABCD2, HEART and other calculators  Patient's presentation is most consistent with acute complicated illness / injury requiring diagnostic workup.  MDM generated using voice dictation software and Mizell contain dictation errors. Please contact me for any clarification or with any questions.    Physical Exam, PMH, PSH, Family History, and Social Hsitory   Vitals:   01/20/24 1331 01/20/24 1332 01/20/24 1337 01/20/24 1743  BP: (!) 162/102     Pulse: 96     Resp: 20     Temp:   98.1 F (36.7 C) 98.2 F (36.8 C)  TempSrc:   Oral Oral  SpO2:  99%      Physical Exam Constitutional:      Appearance: Normal appearance.  HENT:     Head: Normocephalic and atraumatic.  Eyes:     Extraocular Movements: Extraocular movements intact.     Pupils: Pupils are equal, round, and reactive to light.  Cardiovascular:     Rate and Rhythm: Normal rate and regular rhythm.  Pulmonary:     Effort: Pulmonary effort is normal.     Breath sounds: Normal breath sounds. No wheezing or rales.  Abdominal:     General: Abdomen is flat.     Palpations: Abdomen is soft.     Tenderness: There is no abdominal tenderness. There is no right CVA tenderness, left CVA tenderness, guarding or rebound.  Musculoskeletal:     Cervical back: Normal range of motion.     Right lower leg: No edema.     Left lower leg: No edema.  Skin:    General: Skin is warm and dry.     Capillary Refill: Capillary refill takes less than 2 seconds.  Neurological:     General: No focal deficit present.     Mental Status: He is alert and oriented to person, place, and time.     Past Medical History:  Diagnosis Date   Allergy    Arthritis    Cataract    CKD (chronic kidney disease), stage III (HCC)    Coronary artery disease, non-occlusive  09/2009   Minimal CAD / no flow-limiting lesions on Cath; CPX February 2022: 1200 exercise, reached 91% image PRN.  Peak VO2 26.5-normal= excellent functional capacity.  High anaerobic threshold suggest excellent trending effect.  At peak exercise, limited by obesity.:  Normal CPX   GERD (gastroesophageal reflux disease)    Hyperlipidemia    Hypertension    Myocardial infarction - Type 2a MI 2015    Felt to be Type II MI-demand infarct   Neuromuscular disorder (HCC)    Obstructive sleep apnea    Cpap as of 27 days ago   Sleep apnea    wears cpap    Syncope    due to hypotension from BP meds      Past Surgical History:  Procedure Laterality Date   CARDIOPULMONARY STRESS TEST  03/17/2020   PreEx PFTs: FVC 4.33 (105%), FEV1 3.23 (104%)  FEV1/FVC 75 (98% ) MVV 90 (73%): NORMAL; Exercise 12 min;hip and knee fatigue and mild dyspnea.  Rest HR 80 bpm-> max HR 135 bpm (91% of MPHR); BP 124/76 up to 166/74 mmHg; good exercise effort. Peak VO2 26.5 (108% predicted); VE/VCO2 slope 30.  Peak RER 1.1, ventilator threshold 20.6 (84% predicted, 78% measured peak VO2).  Pulse ox remained 94 to 99%   CARPAL TUNNEL RELEASE Left 02/2013   COLONOSCOPY     INGUINAL HERNIA REPAIR Left 1983   with vasectomy   INTRAMEDULLARY (IM) NAIL INTERTROCHANTERIC Left 07/29/2022   Procedure: INTRAMEDULLARY (IM) NAIL INTERTROCHANTERIC;  Surgeon: Sharl Selinda Dover, MD;  Location: [facility] OR;  Service: Orthopedics;  Laterality: Left;   LEFT HEART CATH AND CORONARY ANGIOGRAPHY  10/20/2009   (In the setting of MI with trivial troponin elevation). LM normal --<LAD, LCx. LAD (2 small Diags) - no significant disease, LCx (co-dom with PDA) & 2 OMs (OM2 moderate size with faint hazy defect - not flow limiting. Co-dom RCA with minor irregularities.  EF ~60%. ? Mild Inf HK. --> Med Rx.  Felt to be Type II MI-demand infarct   POLYPECTOMY     SPERMATOCELECTOMY  1998   TRANSTHORACIC ECHOCARDIOGRAM  10/2012; 12/2015   a) EF 55 to 60%.   GR 1 DD.  Normal motion.  Normal valves.;; b) Normal LV size and function.  EF 55 to 60%.  GR 2 DD.  Normal valves.   VASECTOMY  1983   with inguinal hernia repair     Family History  Problem Relation Age of Onset   Stroke Mother    Hypertension Mother    Diabetes Father    Asthma Father    Heart failure Father    Heart disease Father    Hypertension Sister    Diabetes Sister    Neuropathy Brother    Hypertension Brother    Diabetes Brother    Heart disease Brother    Prostate cancer Brother    Hypertension Brother    Colon polyps Brother    Hypertension Brother    Hypertension Brother    Rectal cancer Neg Hx    Stomach cancer Neg Hx    Esophageal cancer Neg Hx    Colon cancer Neg Hx     Social History   Tobacco Use   Smoking status: Never    Passive exposure: Past   Smokeless tobacco: Never  Substance Use Topics   Alcohol use: Yes    Alcohol/week: 3.0 - 5.0 standard drinks of alcohol    Types: 3 - 5 Cans of beer per week    Comment:  a few beers in a week.     Procedures   If procedures were preformed on this patient, they are listed below:  Procedures   Electronically signed by:   Glendia Carlin Ancona, M.D. PGY-2, Emergency Medicine   Please note that this documentation was produced with the assistance of voice-to-text technology and Knabe contain errors.    Ancona Glendia, MD 01/20/24 8062    Franklyn Sid SAILOR, MD 01/20/24 2012  "

## 2024-01-20 NOTE — ED Provider Triage Note (Signed)
 Emergency Medicine Provider Triage Evaluation Note  Mitchell Iwanicki P[place] , a 78 y.o. male  was evaluated in triage.  Pt complains of flank pain.  He believes it Aho be a kidney stone although he does not have a history of this.  He states his family members and neighbors have had similar symptoms and they told him it might be a kidney stone.  Denies history of abdominal aortic aneurysm.  No abdominal pain.  No nausea..  Review of Systems  Positive: As above Negative: As above  Physical Exam  BP (!) 162/102   Pulse 96   Temp 98.1 F (36.7 C) (Oral)   Resp 20   SpO2 99%  Gen:   Awake, no distress   Resp:  Normal effort  MSK:   Moves extremities without difficulty  Other:    Medical Decision Making  Medically screening exam initiated at 1:40 PM.  Appropriate orders placed.  Ebrahim Deremer Barba was informed that the remainder of the evaluation will be completed by another provider, this initial triage assessment does not replace that evaluation, and the importance of remaining in the ED until their evaluation is complete.    Hildegard Loge, PA-C 01/20/24 1340

## 2024-02-24 ENCOUNTER — Encounter

## 2024-04-19 ENCOUNTER — Encounter

## 2024-05-15 ENCOUNTER — Ambulatory Visit

## 2024-05-23 ENCOUNTER — Ambulatory Visit

## 2024-05-30 ENCOUNTER — Encounter
